# Patient Record
Sex: Female | Born: 1940
Health system: Southern US, Community
[De-identification: ages and names within clinical notes are randomized; demographics above are authoritative.]

## PROBLEM LIST (undated history)

## (undated) DIAGNOSIS — K449 Diaphragmatic hernia without obstruction or gangrene: Secondary | ICD-10-CM

## (undated) DIAGNOSIS — Z9889 Other specified postprocedural states: Secondary | ICD-10-CM

## (undated) DIAGNOSIS — F419 Anxiety disorder, unspecified: Secondary | ICD-10-CM

## (undated) DIAGNOSIS — B3781 Candidal esophagitis: Secondary | ICD-10-CM

## (undated) DIAGNOSIS — F32A Depression, unspecified: Secondary | ICD-10-CM

## (undated) DIAGNOSIS — Z8719 Personal history of other diseases of the digestive system: Secondary | ICD-10-CM

## (undated) DIAGNOSIS — W19XXXA Unspecified fall, initial encounter: Secondary | ICD-10-CM

## (undated) DIAGNOSIS — IMO0002 Reserved for concepts with insufficient information to code with codable children: Secondary | ICD-10-CM

## (undated) DIAGNOSIS — I10 Essential (primary) hypertension: Secondary | ICD-10-CM

## (undated) DIAGNOSIS — M1990 Unspecified osteoarthritis, unspecified site: Secondary | ICD-10-CM

## (undated) DIAGNOSIS — F410 Panic disorder [episodic paroxysmal anxiety] without agoraphobia: Secondary | ICD-10-CM

## (undated) DIAGNOSIS — E7800 Pure hypercholesterolemia, unspecified: Secondary | ICD-10-CM

## (undated) DIAGNOSIS — R112 Nausea with vomiting, unspecified: Secondary | ICD-10-CM

## (undated) DIAGNOSIS — K2960 Other gastritis without bleeding: Secondary | ICD-10-CM

## (undated) DIAGNOSIS — G4730 Sleep apnea, unspecified: Secondary | ICD-10-CM

## (undated) DIAGNOSIS — R296 Repeated falls: Secondary | ICD-10-CM

## (undated) DIAGNOSIS — E669 Obesity, unspecified: Secondary | ICD-10-CM

## (undated) DIAGNOSIS — K219 Gastro-esophageal reflux disease without esophagitis: Secondary | ICD-10-CM

## (undated) DIAGNOSIS — N189 Chronic kidney disease, unspecified: Secondary | ICD-10-CM

## (undated) DIAGNOSIS — F329 Major depressive disorder, single episode, unspecified: Secondary | ICD-10-CM

## (undated) HISTORY — PX: APPENDECTOMY: SHX54

## (undated) HISTORY — DX: Pure hypercholesterolemia, unspecified: E78.00

## (undated) HISTORY — DX: Unspecified fall, initial encounter: W19.XXXA

## (undated) HISTORY — DX: Essential (primary) hypertension: I10

## (undated) HISTORY — DX: Anxiety disorder, unspecified: F41.9

## (undated) HISTORY — DX: Depression, unspecified: F32.A

## (undated) HISTORY — PX: TUBAL LIGATION: SHX77

## (undated) HISTORY — DX: Obesity, unspecified: E66.9

## (undated) HISTORY — DX: Sleep apnea, unspecified: G47.30

## (undated) HISTORY — DX: Other gastritis without bleeding: K29.60

## (undated) HISTORY — DX: Major depressive disorder, single episode, unspecified: F32.9

## (undated) HISTORY — DX: Candidal esophagitis: B37.81

## (undated) HISTORY — DX: Gastro-esophageal reflux disease without esophagitis: K21.9

## (undated) HISTORY — DX: Chronic kidney disease, unspecified: N18.9

## (undated) HISTORY — DX: Reserved for concepts with insufficient information to code with codable children: IMO0002

## (undated) HISTORY — DX: Diaphragmatic hernia without obstruction or gangrene: K44.9

## (undated) HISTORY — DX: Panic disorder (episodic paroxysmal anxiety): F41.0

## (undated) HISTORY — DX: Repeated falls: R29.6

## (undated) HISTORY — DX: Unspecified osteoarthritis, unspecified site: M19.90

## (undated) HISTORY — DX: Personal history of other diseases of the digestive system: Z87.19

---

## 1985-10-19 HISTORY — PX: ABDOMINAL HYSTERECTOMY: SHX81

## 1987-10-20 HISTORY — PX: BREAST EXCISIONAL BIOPSY: SUR124

## 1987-10-20 HISTORY — PX: BREAST SURGERY: SHX581

## 1998-11-05 ENCOUNTER — Other Ambulatory Visit: Admission: RE | Admit: 1998-11-05 | Discharge: 1998-11-05 | Payer: Self-pay | Admitting: Obstetrics and Gynecology

## 1999-08-21 ENCOUNTER — Encounter: Payer: Self-pay | Admitting: Obstetrics and Gynecology

## 1999-08-21 ENCOUNTER — Encounter: Admission: RE | Admit: 1999-08-21 | Discharge: 1999-08-21 | Payer: Self-pay | Admitting: Obstetrics and Gynecology

## 1999-10-13 ENCOUNTER — Emergency Department (HOSPITAL_COMMUNITY): Admission: EM | Admit: 1999-10-13 | Discharge: 1999-10-13 | Payer: Self-pay | Admitting: *Deleted

## 1999-10-20 HISTORY — PX: CHOLECYSTECTOMY: SHX55

## 1999-11-06 ENCOUNTER — Encounter: Payer: Self-pay | Admitting: General Surgery

## 1999-11-11 ENCOUNTER — Encounter (INDEPENDENT_AMBULATORY_CARE_PROVIDER_SITE_OTHER): Payer: Self-pay | Admitting: *Deleted

## 1999-11-11 ENCOUNTER — Ambulatory Visit (HOSPITAL_COMMUNITY): Admission: RE | Admit: 1999-11-11 | Discharge: 1999-11-12 | Payer: Self-pay | Admitting: General Surgery

## 2000-08-25 ENCOUNTER — Encounter: Payer: Self-pay | Admitting: Obstetrics and Gynecology

## 2000-08-25 ENCOUNTER — Encounter: Admission: RE | Admit: 2000-08-25 | Discharge: 2000-08-25 | Payer: Self-pay | Admitting: Obstetrics and Gynecology

## 2001-09-02 ENCOUNTER — Encounter: Admission: RE | Admit: 2001-09-02 | Discharge: 2001-09-02 | Payer: Self-pay | Admitting: Obstetrics and Gynecology

## 2001-09-02 ENCOUNTER — Encounter: Payer: Self-pay | Admitting: Obstetrics and Gynecology

## 2002-04-03 ENCOUNTER — Other Ambulatory Visit: Admission: RE | Admit: 2002-04-03 | Discharge: 2002-04-03 | Payer: Self-pay | Admitting: Dermatology

## 2002-09-13 ENCOUNTER — Encounter: Payer: Self-pay | Admitting: Obstetrics and Gynecology

## 2002-09-13 ENCOUNTER — Encounter: Admission: RE | Admit: 2002-09-13 | Discharge: 2002-09-13 | Payer: Self-pay | Admitting: Obstetrics and Gynecology

## 2003-10-11 ENCOUNTER — Encounter: Admission: RE | Admit: 2003-10-11 | Discharge: 2003-10-11 | Payer: Self-pay | Admitting: Obstetrics and Gynecology

## 2004-06-20 ENCOUNTER — Inpatient Hospital Stay (HOSPITAL_BASED_OUTPATIENT_CLINIC_OR_DEPARTMENT_OTHER): Admission: RE | Admit: 2004-06-20 | Discharge: 2004-06-20 | Payer: Self-pay | Admitting: Cardiovascular Disease

## 2005-01-05 ENCOUNTER — Encounter: Admission: RE | Admit: 2005-01-05 | Discharge: 2005-01-05 | Payer: Self-pay | Admitting: Obstetrics and Gynecology

## 2005-03-04 ENCOUNTER — Ambulatory Visit: Payer: Self-pay | Admitting: Gastroenterology

## 2005-04-10 ENCOUNTER — Emergency Department (HOSPITAL_COMMUNITY): Admission: EM | Admit: 2005-04-10 | Discharge: 2005-04-11 | Payer: Self-pay | Admitting: Emergency Medicine

## 2005-04-27 ENCOUNTER — Ambulatory Visit: Payer: Self-pay | Admitting: Gastroenterology

## 2006-01-08 ENCOUNTER — Encounter: Admission: RE | Admit: 2006-01-08 | Discharge: 2006-01-08 | Payer: Self-pay | Admitting: Obstetrics and Gynecology

## 2006-04-08 ENCOUNTER — Encounter: Admission: RE | Admit: 2006-04-08 | Discharge: 2006-04-08 | Payer: Self-pay | Admitting: Internal Medicine

## 2007-04-13 ENCOUNTER — Encounter: Admission: RE | Admit: 2007-04-13 | Discharge: 2007-04-13 | Payer: Self-pay | Admitting: Obstetrics and Gynecology

## 2008-04-13 ENCOUNTER — Encounter: Admission: RE | Admit: 2008-04-13 | Discharge: 2008-04-13 | Payer: Self-pay | Admitting: Anesthesiology

## 2009-02-13 ENCOUNTER — Telehealth: Payer: Self-pay | Admitting: Gastroenterology

## 2009-05-07 ENCOUNTER — Encounter: Admission: RE | Admit: 2009-05-07 | Discharge: 2009-05-07 | Payer: Self-pay | Admitting: Obstetrics and Gynecology

## 2009-07-05 DIAGNOSIS — G4733 Obstructive sleep apnea (adult) (pediatric): Secondary | ICD-10-CM | POA: Insufficient documentation

## 2009-07-05 DIAGNOSIS — K219 Gastro-esophageal reflux disease without esophagitis: Secondary | ICD-10-CM | POA: Insufficient documentation

## 2009-08-23 DIAGNOSIS — I4510 Unspecified right bundle-branch block: Secondary | ICD-10-CM | POA: Insufficient documentation

## 2009-10-14 ENCOUNTER — Inpatient Hospital Stay (HOSPITAL_COMMUNITY): Admission: EM | Admit: 2009-10-14 | Discharge: 2009-10-20 | Payer: Self-pay | Admitting: Emergency Medicine

## 2009-10-14 ENCOUNTER — Encounter (INDEPENDENT_AMBULATORY_CARE_PROVIDER_SITE_OTHER): Payer: Self-pay

## 2010-02-03 ENCOUNTER — Encounter (INDEPENDENT_AMBULATORY_CARE_PROVIDER_SITE_OTHER): Payer: Self-pay | Admitting: *Deleted

## 2010-05-09 ENCOUNTER — Encounter: Admission: RE | Admit: 2010-05-09 | Discharge: 2010-05-09 | Payer: Self-pay | Admitting: Obstetrics and Gynecology

## 2010-09-04 DIAGNOSIS — R809 Proteinuria, unspecified: Secondary | ICD-10-CM | POA: Insufficient documentation

## 2010-11-01 ENCOUNTER — Emergency Department (HOSPITAL_COMMUNITY)
Admission: EM | Admit: 2010-11-01 | Discharge: 2010-11-01 | Payer: Self-pay | Source: Home / Self Care | Admitting: Emergency Medicine

## 2010-11-18 NOTE — Letter (Signed)
Summary: New Patient letter  [facility]  [address] [place], [place] [address]   Phone: [phone]  Fax: [phone]       02/03/2010 MRN: [id number]  Linda [name] [address] [place], [place]  [address]  Dear Ms. Cutillo,  Welcome to the Gastroenterology Division at [facility].    You are scheduled to see Dr. Russella Dar on 03/20/2010 at 10:00AM on the 3rd floor at [facility], [address].  We ask that you try to arrive at our office 15 minutes prior to your appointment time to allow for check-in.  We would like you to complete the enclosed self-administered evaluation form prior to your visit and bring it with you on the day of your appointment.  We will review it with you.  Also, please bring a complete list of all your medications or, if you prefer, bring the medication bottles and we will list them.  Please bring your insurance card so that we may make a copy of it.  If your insurance requires a referral to see a specialist, please bring your referral form from your primary care physician.  Co-payments are due at the time of your visit and may be paid by cash, check or credit card.     Your office visit will consist of a consult with your physician (includes a physical exam), any laboratory testing he/she may order, scheduling of any necessary diagnostic testing (e.g. x-ray, ultrasound, CT-scan), and scheduling of a procedure (e.g. Endoscopy, Colonoscopy) if required.  Please allow enough time on your schedule to allow for any/all of these possibilities.    If you cannot keep your appointment, please call [phone] to cancel or reschedule prior to your appointment date.  This allows [place] the opportunity to schedule an appointment for another patient in need of care.  If you do not cancel or reschedule by 5 p.m. the business day prior to your appointment date, you will be charged a $50.00 late cancellation/no-show fee.    Thank you for choosing  [facility] for your medical needs.  We appreciate the opportunity to care for you.  Please visit [place] at our website  to learn more about our practice.                     Sincerely,                                                             The Gastroenterology Division

## 2011-01-04 LAB — CBC
Hemoglobin: 11.3 g/dL — ABNORMAL LOW (ref 12.0–15.0)
MCHC: 34.8 g/dL (ref 30.0–36.0)
MCV: 94.3 fL (ref 78.0–100.0)
RBC: 3.44 MIL/uL — ABNORMAL LOW (ref 3.87–5.11)
RDW: 12.1 % (ref 11.5–15.5)

## 2011-01-04 LAB — CREATININE, SERUM
Creatinine, Ser: 0.64 mg/dL (ref 0.4–1.2)
GFR calc Af Amer: >60 mL/min (ref >60)
GFR calc non Af Amer: >60 mL/min (ref >60)

## 2011-01-04 LAB — POTASSIUM: Potassium: 4.6 mEq/L (ref 3.5–5.1)

## 2011-01-19 LAB — CBC
Hemoglobin: 10.1 g/dL — ABNORMAL LOW (ref 12.0–15.0)
MCHC: 34.6 g/dL (ref 30.0–36.0)
MCV: 94.7 fL (ref 78.0–100.0)
Platelets: 356 10*3/uL (ref 150–400)
Platelets: 506 10*3/uL — ABNORMAL HIGH (ref 150–400)
RBC: 3.06 MIL/uL — ABNORMAL LOW (ref 3.87–5.11)
RDW: 11.8 % (ref 11.5–15.5)
RDW: 12 % (ref 11.5–15.5)
WBC: 10.8 10*3/uL — ABNORMAL HIGH (ref 4.0–10.5)
WBC: 13.9 10*3/uL — ABNORMAL HIGH (ref 4.0–10.5)

## 2011-01-19 LAB — BASIC METABOLIC PANEL
GFR calc Af Amer: >60 mL/min (ref >60)
GFR calc non Af Amer: >60 mL/min (ref >60)
Glucose, Bld: 120 mg/dL — ABNORMAL HIGH (ref 70–99)
Sodium: 137 mEq/L (ref 135–145)

## 2011-03-06 NOTE — Op Note (Signed)
[facility]  Patient:    VAISHALI BAISE                    MRN: [id number] Proc. Date: 11/11/99 Adm. Date:  [id number] Attending:  Carson Myrtle                           Operative Report  PREOPERATIVE DIAGNOSIS:  Chronic cholecystolithiasis.  POSTOPERATIVE DIAGNOSIS:  Chronic cholecystolithiasis.  PROCEDURE:  Laparoscopic cholecystectomy.  SURGEON:  Timothy E. Earlene Plater, M.D.  ASSISTANT:  Anselm Pancoast. Zachery Dakins, M.D.  ANESTHESIA:  CRNA supervised M.D.  INDICATIONS:  The patient has been otherwise well with a history of a hiatal hernia reflux and in the past few months, onset of right upper quadrant pain and food intolerance, and frequent pain and nausea.  Her GI workup has been completed by  Judie Petit T. Pleas Koch., M.D. [facility].  She was seen in the [facility] Emergency Room where an ultrasound was done.  Gallstones were diagnosed.  She saw me in consultation and we have arranged for this laparoscopic cholecystectomy.  I believe she agrees and understands the procedure.  LABORATORY DATA:  Today are normal.  DESCRIPTION OF PROCEDURE:  The patient was brought back to the operating room and placed supine.  General endotracheal anesthesia administered.  The abdomen was prepped and draped in the usual fashion.  Prior to any skin incision, 0.25% Marcaine with epinephrine was used __________.  An infraumbilical incision was made.  The fascia identified and opened vertically.  The peritoneum entered without complication.  The Hussan catheter placed and tied in place.  The abdomen then insufflated, the camera introduced, and _______ was unremarkable.  The gallbladder did appear somewhat thickened.  There were adhesions of the omentum to the gallbladder.  A second 10 mm trocar was introduced in the midepigastric, and two 5 mm trocars in the right upper quadrant.  Appropriate instruments applied. The gallbladder grasped and placed on  tension.  The adhesions taken down and the gallbladder anatomy carefully analyzed.  At the base of the gallbladder, dissection of the peritoneum revealed a normal appearing cystic artery anterior to the cystic duct.  Both of these were completely dissected out ________, clearly visualized, and then triply clipped and divided.  Three branches of the posterior artery were encountered up the back of the gallbladder, in the gallbladder bed, and these were individually clipped with metal clips.  Otherwise, the gallbladder was removed rom the gallbladder bed without incident or complication, or unusual finding or anatomy.  The gallbladder bed was carefully inspected.  It was hemostatic and there was no bile evident.  Irrigation carried out.  Gallbladder removed through the infraumbilical incision and passed off the field for specimen.  That incision closed and tied.  Irrigation carried out and careful inspection.  All instruments, trocars, CO2, and air then removed.  Each trocar site had been anesthetized and  each skin incision closed with 4-0 Monocryl.  Counts were correct.  She tolerated it well.  Was extubated and taken to the recovery room in good condition. DD:  11/11/99 TD:  11/11/99 Job: [id number]

## 2011-03-06 NOTE — Cardiovascular Report (Signed)
Linda [name], STARLIPER                        ACCOUNT NO.:  [id number]   MEDICAL RECORD NO.:  [id number]                   PATIENT TYPE:  OIB   LOCATION:  6501                                 FACILITY:  [facility]   PHYSICIAN:  Vesta Mixer, M.D.              DATE OF BIRTH:  04-23-1941   DATE OF PROCEDURE:  06/20/2004  DATE OF DISCHARGE:  06/20/2004                              CARDIAC CATHETERIZATION   HISTORY:  Ms. Jaster is a 70 year old female with an extremely strong  family history of coronary artery disease.  Most of her family members have  died at an earlier age due to myocardial infarction.  She recently started  having some episodes of chest fullness and some shortness of breath.  She  had a stress Cardiolite study which revealed some anterolateral ischemia.  She was referred for heart catheterization for further evaluation.   PROCEDURE:  Left heart catheterization with coronary angiography.   The right femoral artery was easily cannulated using the modified Seldinger  technique.   HEMODYNAMICS:  The left ventricular pressure was 150/12 with an aortic  pressure of 150/61.   CORONARY ANGIOGRAPHY:  1.  Left main was fairly smooth and normal.  2.  The left anterior descending artery:  Minor luminal irregularities in      the left anterior descending artery.  The first diagonal artery was      unremarkable.  3.  The left circumflex artery was fairly normal.  It supplies a significant      distribution down to the second obtuse marginal area and to the      posterior lateral wall.  The left circumflex artery is fairly tortuous.  4.  The right coronary artery is large and dominant.  There is a slight      tapered narrowing in the proximal aspect of the RCA which includes this      ostium.  This narrowing probably represents a 20-30% stenosis.  It is      relatively smooth and there are no ulcerations. Otherwise, there is no      significant plaque in the right coronary  artery.  5.  The posterior descending artery is a relatively small vessel, but is      normal.  There is a small posterior lateral branch.  This branch is also      normal.   LEFT VENTRICULOGRAM:  Left ventriculogram was performed using a 30 RAO  projection.  It reveals normal left ventricular systolic function.  The  ejection fraction is 65%.  There is no mitral regurgitation.   COMPLICATIONS:  None.   CONCLUSIONS:  1.  Minimal coronary artery irregularities.  2.  Normal left ventricular systolic function.   We will continue with  medical therapy.  Vesta Mixer, M.D.    PJN/MEDQ  D:  06/20/2004  T:  06/21/2004  Job:  [id number]   cc:   Loraine Leriche A. Waynard Edwards, M.D.  [address]  [place]  [place] [address]  Fax: [phone]

## 2011-03-06 NOTE — H&P (Signed)
NAMESOMALY, MARTENEY                        ACCOUNT NO.:  [id number]   MEDICAL RECORD NO.:  [id number]                   PATIENT TYPE:  AMB   LOCATION:                                       FACILITY:  [facility]   PHYSICIAN:  Vesta Mixer, M.D.              DATE OF BIRTH:  [birth year]   DATE OF ADMISSION:  06/20/2004  DATE OF DISCHARGE:                                HISTORY & PHYSICAL   REASON FOR ADMISSION:  [name] is a 70 year old female who is now  admitted for chest pain after having some episodes of chest fullness and an  abnormal Cardiolite study.   HISTORY OF PRESENT ILLNESS:  [name] is a 70 year old female with a  history of anxiety and hypercholesterolemia.  For the past several weeks,  she has been having some fullness in her chest.  These episodes occur at  various times.  They are not necessarily associated with exercise, although  she does note that they occur more with eating.  She does not do any regular  exercise so she has not really challenged the idea that they are not  associated with exercise.  She states that the pain is a fullness and a  pressure-like sensation.  It lasts for several minutes and causes her to be  somewhat short of breath.   She recently had a stress Cardiolite study which revealed reversible  ischemia in the anterior lateral wall.  We could not exclude breast  attenuation.  Her left ventricular systolic function was normal with an  ejection fraction of 68%.   She is seen in the office today and states that she still has these episodes  of chest fullness.   CURRENT MEDICATIONS:  1. Wellbutrin 1 q.d.  2. Klonopin 1 q.d.  3. Lexapro 1 q.d.  4. Premarin 1 q.d.  5. Zocor 1 q.d.  6. Aspirin 1 q.d.   ALLERGIES:  She is allergic to AMOXICILLIN which causes hives.   PAST MEDICAL HISTORY:  1. Anxiety.  2. Hypercholesterolemia.   SOCIAL HISTORY:  The patient does not smoke.  She does not drink alcohol.  She works at [employer] in Clinical biochemist.   FAMILY HISTORY:  Her father died at age 53 due to lung cancer.  He had a  myocardial infarction in the past and also had a history of hypertension.  Her mother had a history of coronary artery disease and died of a myocardial  infarction at age 74.  She had three brothers who have all died of  myocardial infarction in their 31s.   REVIEW OF SYMPTOMS:  Reviewed and is essentially negative.   PHYSICAL EXAMINATION:  GENERAL:  She is a middle-aged female in no acute  distress.  She is alert and oriented x 3 and her mood and affect are normal.  VITAL SIGNS:  Weight is 149, blood pressure 140/72, heart rate of 76.  NECK:  There are 2+ carotids.  She has no bruits.  There is no JVD.  No  thyromegaly.  LUNGS:  Clear to auscultation.  HEART:  Regular rate.  S1 and S2 with no murmurs, rubs, or gallops.  ABDOMEN:  Good bowel sounds and is nontender.  EXTREMITIES:  She has no clubbing, cyanosis, or edema.  NEUROLOGICAL:  Nonfocal.   IMPRESSION:  [name] presents with some episodes of chest fullness.  She has an extremely strong family history of coronary artery disease.  In  fact, many of her family members have died at a very young age due to  coronary artery disease.  She has an abnormal stress Cardiolite study with  evidence of reversible ischemia at anterior wall.  I would favor performing  a cardiac catheterization.  We have discussed the risks, benefits, and  options of heart catheterization.  She understands and agrees to proceed.  We will schedule her for an outpatient heart catheterization.                                                Vesta Mixer, M.D.    PJN/MEDQ  D:  06/16/2004  T:  06/16/2004  Job:  [id number]   cc:   Loraine Leriche A. Waynard Edwards, M.D.  [address]  [place]  [place] [address]  Fax: [phone]

## 2011-04-03 ENCOUNTER — Other Ambulatory Visit: Payer: Self-pay | Admitting: Internal Medicine

## 2011-04-03 DIAGNOSIS — Z1231 Encounter for screening mammogram for malignant neoplasm of breast: Secondary | ICD-10-CM

## 2011-05-11 ENCOUNTER — Ambulatory Visit
Admission: RE | Admit: 2011-05-11 | Discharge: 2011-05-11 | Disposition: A | Discharge status: Home / Self Care | Payer: Medicare Other | Source: Ambulatory Visit | Attending: Internal Medicine | Admitting: Internal Medicine

## 2011-05-11 DIAGNOSIS — Z1231 Encounter for screening mammogram for malignant neoplasm of breast: Secondary | ICD-10-CM

## 2011-06-16 ENCOUNTER — Ambulatory Visit: Payer: Medicare Other | Admitting: Gynecology

## 2011-06-17 ENCOUNTER — Encounter: Payer: Self-pay | Admitting: Gynecology

## 2011-06-17 ENCOUNTER — Ambulatory Visit (INDEPENDENT_AMBULATORY_CARE_PROVIDER_SITE_OTHER): Payer: Medicare Other | Admitting: Gynecology

## 2011-06-17 VITALS — BP 136/60 | Ht 61.5 in | Wt 141.0 lb

## 2011-06-17 DIAGNOSIS — N898 Other specified noninflammatory disorders of vagina: Secondary | ICD-10-CM

## 2011-06-17 DIAGNOSIS — B373 Candidiasis of vulva and vagina: Secondary | ICD-10-CM

## 2011-06-17 DIAGNOSIS — E7800 Pure hypercholesterolemia, unspecified: Secondary | ICD-10-CM | POA: Insufficient documentation

## 2011-06-17 DIAGNOSIS — N8111 Cystocele, midline: Secondary | ICD-10-CM

## 2011-06-17 DIAGNOSIS — N952 Postmenopausal atrophic vaginitis: Secondary | ICD-10-CM

## 2011-06-17 DIAGNOSIS — I10 Essential (primary) hypertension: Secondary | ICD-10-CM | POA: Insufficient documentation

## 2011-06-17 DIAGNOSIS — F419 Anxiety disorder, unspecified: Secondary | ICD-10-CM | POA: Insufficient documentation

## 2011-06-17 DIAGNOSIS — Z79890 Hormone replacement therapy (postmenopausal): Secondary | ICD-10-CM

## 2011-06-17 DIAGNOSIS — B3731 Acute candidiasis of vulva and vagina: Secondary | ICD-10-CM

## 2011-06-17 MED ORDER — FLUCONAZOLE 150 MG PO TABS: 150.0000 mg | ORAL_TABLET | Freq: Once | ORAL | Status: AC

## 2011-06-17 MED ORDER — ESTROGENS CONJUGATED 0.45 MG PO TABS: 0.4500 mg | ORAL_TABLET | Freq: Every day | ORAL | Status: DC

## 2011-06-17 NOTE — Progress Notes (Signed)
Linda [name] 12-19-1940 [id number]        70 y.o.  for followup in discussion about ERT. She is being followed by Dr. Sherrye Payor for hypertension, hypercholesterolemia, GERD, anxiety and depression. She is status post TAH LSO in 1987 by Dr. Leota Sauers for menorrhagia, leiomyoma and endometriosis.  She currently is on Premarin 0.45 mg daily.  Past medical history,surgical history, allergies, family history and social history were all reviewed and documented in the EPIC chart. ROS:  Was performed and pertinent positives and negatives are included in the history.  Exam: chaperone present Filed Vitals:   06/17/11 1002  BP: 136/60   General appearance  Normal Skin grossly normal Head/Neck normal with no cervical or supraclavicular adenopathy thyroid normal Lungs  clear Cardiac RR, without RMG Abdominal  soft, nontender, without masses, organomegaly or hernia Breasts  examined lying and sitting without masses, retractions, discharge or axillary adenopathy. Pelvic  Ext/BUS/vagina  normal atrophic changes noted. First degree cystocele with straining.  White discharge KOH wet prep done  Adnexa  Without masses or tenderness    Anus and perineum  normal   Rectovaginal  normal sphincter tone without palpated masses or tenderness.    Assessment/Plan:  70 y.o. female  #1 ERT. Patient currently is on Premarin 0.45 mg daily. She has been on Premarin since her hysterectomy. I reviewed with the patient the issues of ERT to include her age, WHI study, ACOG and NAMS statements to include lowest dose for shortness period of time. Increased risk of stroke, heart attack, DVT as well as possible increased risk of breast cancer was all discussed with her. The issues of oral versus transdermal first pass effect, decreased risk of thrombosis with transdermal was also reviewed.  My recommendation is for her to wean herself from the Premarin over a several week period and see how she does. If she has significant  symptoms such as hot flushes, night sweats that she can reinitiate the Premarin accepting the above risks. Patient is comfortable with this. She does not want to try a transdermal and she is comfortable being on the oral. #2 White discharge. KOH wet prep is positive for yeast we'll treat with Diflucan 150x1 dose. #3 Mild cystocele. Patient is asymptomatic. She is having no issues with incontinence. Will monitor at present. #4 Health maintenance. No Pap was done today. I reviewed current guidelines with her. She has no history of abnormal Pap smears before, she is over 65  and she is status post hysterectomy. She's comfortable with not doing Pap smears.  She sees Dr. Sherrye Payor routinely is up-to-date with colon screening with last colonoscopy in 2007. She says that she checks her stool annually for blood. She just had her mammogram in July she'll continue with annual mammography and self breast exams on a monthly basis. She also reports having a DEXA study 2 years ago and will followup with Dr. Sherrye Payor in reference to this. No blood work was done today as is all done through his office. Assuming she does well she'll see me in a year. If she has any issues or questions she'll follow up sooner.   Dara Lords MD, 10:51 AM 06/17/2011

## 2011-11-23 DIAGNOSIS — I10 Essential (primary) hypertension: Secondary | ICD-10-CM | POA: Diagnosis not present

## 2011-11-23 DIAGNOSIS — E785 Hyperlipidemia, unspecified: Secondary | ICD-10-CM | POA: Diagnosis not present

## 2011-11-23 DIAGNOSIS — F3289 Other specified depressive episodes: Secondary | ICD-10-CM | POA: Diagnosis not present

## 2011-11-23 DIAGNOSIS — F329 Major depressive disorder, single episode, unspecified: Secondary | ICD-10-CM | POA: Diagnosis not present

## 2011-11-23 DIAGNOSIS — R809 Proteinuria, unspecified: Secondary | ICD-10-CM | POA: Diagnosis not present

## 2011-11-30 ENCOUNTER — Telehealth: Payer: Self-pay | Admitting: *Deleted

## 2011-11-30 NOTE — Telephone Encounter (Signed)
Called patient to tell her that we processed a prior authorization for Premarin 0.45 and was approved through insurance.

## 2011-12-01 DIAGNOSIS — Z0000 Encounter for general adult medical examination without abnormal findings: Secondary | ICD-10-CM | POA: Diagnosis not present

## 2011-12-01 DIAGNOSIS — N182 Chronic kidney disease, stage 2 (mild): Secondary | ICD-10-CM | POA: Diagnosis not present

## 2011-12-01 DIAGNOSIS — E785 Hyperlipidemia, unspecified: Secondary | ICD-10-CM | POA: Diagnosis not present

## 2011-12-01 DIAGNOSIS — I10 Essential (primary) hypertension: Secondary | ICD-10-CM | POA: Diagnosis not present

## 2011-12-02 DIAGNOSIS — Z1212 Encounter for screening for malignant neoplasm of rectum: Secondary | ICD-10-CM | POA: Diagnosis not present

## 2012-04-04 DIAGNOSIS — H2510 Age-related nuclear cataract, unspecified eye: Secondary | ICD-10-CM | POA: Diagnosis not present

## 2012-04-04 DIAGNOSIS — H524 Presbyopia: Secondary | ICD-10-CM | POA: Diagnosis not present

## 2012-04-04 DIAGNOSIS — H52229 Regular astigmatism, unspecified eye: Secondary | ICD-10-CM | POA: Diagnosis not present

## 2012-04-04 DIAGNOSIS — H2589 Other age-related cataract: Secondary | ICD-10-CM | POA: Diagnosis not present

## 2012-04-13 ENCOUNTER — Other Ambulatory Visit: Payer: Self-pay | Admitting: Internal Medicine

## 2012-04-13 DIAGNOSIS — Z1231 Encounter for screening mammogram for malignant neoplasm of breast: Secondary | ICD-10-CM

## 2012-05-16 ENCOUNTER — Ambulatory Visit
Admission: RE | Admit: 2012-05-16 | Discharge: 2012-05-16 | Disposition: A | Discharge status: Home / Self Care | Payer: Medicare Other | Source: Ambulatory Visit | Attending: Internal Medicine | Admitting: Internal Medicine

## 2012-05-16 DIAGNOSIS — Z1231 Encounter for screening mammogram for malignant neoplasm of breast: Secondary | ICD-10-CM | POA: Diagnosis not present

## 2012-05-17 ENCOUNTER — Encounter: Payer: Self-pay | Admitting: Gastroenterology

## 2012-06-09 DIAGNOSIS — R0602 Shortness of breath: Secondary | ICD-10-CM | POA: Diagnosis not present

## 2012-06-09 DIAGNOSIS — I10 Essential (primary) hypertension: Secondary | ICD-10-CM | POA: Diagnosis not present

## 2012-06-09 DIAGNOSIS — F438 Other reactions to severe stress: Secondary | ICD-10-CM | POA: Diagnosis not present

## 2012-06-09 DIAGNOSIS — F4389 Other reactions to severe stress: Secondary | ICD-10-CM | POA: Diagnosis not present

## 2012-06-09 DIAGNOSIS — R9431 Abnormal electrocardiogram [ECG] [EKG]: Secondary | ICD-10-CM | POA: Diagnosis not present

## 2012-06-22 ENCOUNTER — Ambulatory Visit (INDEPENDENT_AMBULATORY_CARE_PROVIDER_SITE_OTHER): Payer: Medicare Other | Admitting: Gastroenterology

## 2012-06-22 ENCOUNTER — Encounter: Payer: Self-pay | Admitting: Gastroenterology

## 2012-06-22 VITALS — BP 132/68 | HR 60 | Ht 61.5 in | Wt 142.0 lb

## 2012-06-22 DIAGNOSIS — K219 Gastro-esophageal reflux disease without esophagitis: Secondary | ICD-10-CM | POA: Diagnosis not present

## 2012-06-22 DIAGNOSIS — Z1211 Encounter for screening for malignant neoplasm of colon: Secondary | ICD-10-CM | POA: Diagnosis not present

## 2012-06-22 NOTE — Progress Notes (Signed)
History of Present Illness: This is a 71 year old female who I have treated in the past. She has a long history of GERD that has been moderately difficult to control. She previously underwent upper endoscopy and colonoscopy in July 2006. She noted a flare of her reflux symptoms several weeks ago but they're now under better control. She also noted mild constipation but this has resolved with increasing her fiber and water intake. She occasionally notes some difficulty swallowing solids and liquids. She had similar symptoms at the time of her last endoscopy and underwent dilation for dysphagia without a stricture. I reviewed recent office records from Dr. Ronnald Collum. Denies weight loss, abdominal pain, diarrhea, change in stool caliber, melena, hematochezia, nausea, vomiting, chest pain.  Allergies  Allergen Reactions  . Amoxicillin   . Aripiprazole    Outpatient Prescriptions Prior to Visit  Medication Sig Dispense Refill  . ALPRAZolam (XANAX) 1 MG tablet Take 1 mg by mouth 3 (three) times daily as needed.        Marland Kitchen aspirin 81 MG tablet Take 81 mg by mouth daily.        . benazepril (LOTENSIN) 40 MG tablet Take 40 mg by mouth daily.        . calcium carbonate (OS-CAL) 600 MG TABS Take 600 mg by mouth 2 (two) times daily with a meal.        . dexlansoprazole (DEXILANT) 60 MG capsule Take 60 mg by mouth daily.        Marland Kitchen estrogens, conjugated, (PREMARIN) 0.45 MG tablet Take 1 tablet (0.45 mg total) by mouth daily.  30 tablet  11  . fish oil-omega-3 fatty acids 1000 MG capsule Take 2 g by mouth daily.        . furosemide (LASIX) 20 MG tablet Take 20 mg by mouth 2 (two) times daily.        Marland Kitchen lamoTRIgine (LAMICTAL) 200 MG tablet Take 200 mg by mouth daily.        . nortriptyline (PAMELOR) 50 MG capsule Take 50 mg by mouth at bedtime.        . potassium chloride SA (K-DUR,KLOR-CON) 20 MEQ tablet Take 20 mEq by mouth 2 (two) times daily.        . risperiDONE (RISPERDAL) 1 MG tablet Takes 1/2 tablet by mouth  every other day      . simvastatin (ZOCOR) 40 MG tablet Take 40 mg by mouth at bedtime.         Past Medical History  Diagnosis Date  . Hypertension   . High cholesterol   . Anxiety   . Depression   . Panic disorder   . GERD (gastroesophageal reflux disease)   . Erosive gastritis   . Hiatal hernia   . Obesity   . DDD (degenerative disc disease)   . Osteoarthritis   . Chronic kidney disease   . Sleep apnea     Does not need CPAP anymore  . History of anal fissures    Past Surgical History  Procedure Date  . Cholecystectomy 2001  . Appendectomy   . Tubal ligation   . Abdominal hysterectomy 1987    LSO / menorrhagia & leiomyomata  . Breast surgery 1989   History   Social History  . Marital Status: Married    Spouse Name: N/A    Number of Children: 1  . Years of Education: N/A   Occupational History  . Retired     Social History Main Topics  . Smoking  status: Never Smoker   . Smokeless tobacco: Never Used  . Alcohol Use: No  . Drug Use: No  . Sexually Active: Yes   Other Topics Concern  . None   Social History Narrative   Daily caffeine    Family History  Problem Relation Age of Onset  . Heart disease Mother   . Lung cancer Father   . Diabetes Sister   . Hypertension Sister   . Diabetes Brother   . Heart disease Brother   . Ovarian cancer Sister   . Heart disease Brother   . Heart disease Brother   . Colon cancer Neg Hx    Review of Systems: Pertinent positive and negative review of systems were noted in the above HPI section. All other review of systems were otherwise negative.  Physical Exam: General: Well developed , well nourished, no acute distress Head: Normocephalic and atraumatic Eyes:  sclerae anicteric, EOMI Ears: Normal auditory acuity Mouth: No deformity or lesions Neck: Supple, no masses or thyromegaly Lungs: Clear throughout to auscultation Heart: Regular rate and rhythm; no murmurs, rubs or bruits Abdomen: Soft, non tender and  non distended. No masses, hepatosplenomegaly or hernias noted. Normal Bowel sounds Musculoskeletal: Symmetrical with no gross deformities  Skin: No lesions on visible extremities Pulses:  Normal pulses noted Extremities: No clubbing, cyanosis, edema or deformities noted Neurological: Alert oriented x 4, grossly nonfocal Cervical Nodes:  No significant cervical adenopathy Inguinal Nodes: No significant inguinal adenopathy Psychological:  Alert and cooperative. Normal mood and affect  Assessment and Recommendations:  1. Chronic GERD with intermittent solid and liquid dysphagia. Recent flare of symptoms has not resolved and her symptoms are under good control. She feels Nexium has worked best for her in the past however this is no longer covered by her insurance. She feels Dexilant is effective and takes occasional ranitidine for breakthrough symptoms. Rule out a stricture, esophagitis. The risks, benefits, and alternatives to endoscopy with possible biopsy and possible dilation were discussed with the patient and they consent to proceed.   2. Colorectal cancer screening, average risk. He states she has annual Hemoccults through Dr. Pierre Bali office and they have been negative. 10 year screening colonoscopy due July 2016.  3. Mild constipation. Continue high fiber diet with increased water intake.

## 2012-06-22 NOTE — Patient Instructions (Addendum)
You have been scheduled for an endoscopy with propofol. Please follow written instructions given to you at your visit today. If you use inhalers (even only as needed), please bring them with you on the day of your procedure. You have been given anti-reflux measures to follow.  CC: Rodrigo Ran, M.D.

## 2012-06-24 DIAGNOSIS — I10 Essential (primary) hypertension: Secondary | ICD-10-CM | POA: Diagnosis not present

## 2012-06-24 DIAGNOSIS — I498 Other specified cardiac arrhythmias: Secondary | ICD-10-CM | POA: Diagnosis not present

## 2012-06-24 DIAGNOSIS — F438 Other reactions to severe stress: Secondary | ICD-10-CM | POA: Diagnosis not present

## 2012-06-24 DIAGNOSIS — F4389 Other reactions to severe stress: Secondary | ICD-10-CM | POA: Diagnosis not present

## 2012-07-05 ENCOUNTER — Ambulatory Visit (AMBULATORY_SURGERY_CENTER): Payer: Medicare Other | Admitting: Gastroenterology

## 2012-07-05 ENCOUNTER — Encounter: Payer: Self-pay | Admitting: Gastroenterology

## 2012-07-05 VITALS — BP 123/45 | HR 60 | Temp 98.4°F | Resp 21 | Ht 62.0 in | Wt 141.0 lb

## 2012-07-05 DIAGNOSIS — K319 Disease of stomach and duodenum, unspecified: Secondary | ICD-10-CM | POA: Diagnosis not present

## 2012-07-05 DIAGNOSIS — K2990 Gastroduodenitis, unspecified, without bleeding: Secondary | ICD-10-CM | POA: Diagnosis not present

## 2012-07-05 DIAGNOSIS — K259 Gastric ulcer, unspecified as acute or chronic, without hemorrhage or perforation: Secondary | ICD-10-CM | POA: Diagnosis not present

## 2012-07-05 DIAGNOSIS — K219 Gastro-esophageal reflux disease without esophagitis: Secondary | ICD-10-CM

## 2012-07-05 DIAGNOSIS — B379 Candidiasis, unspecified: Secondary | ICD-10-CM

## 2012-07-05 DIAGNOSIS — B3781 Candidal esophagitis: Secondary | ICD-10-CM | POA: Diagnosis not present

## 2012-07-05 DIAGNOSIS — K2970 Gastritis, unspecified, without bleeding: Secondary | ICD-10-CM

## 2012-07-05 MED ORDER — FLUCONAZOLE 100 MG PO TABS: 100.0000 mg | ORAL_TABLET | Freq: Every day | ORAL | Status: DC

## 2012-07-05 MED ORDER — SODIUM CHLORIDE 0.9 % IV SOLN: 500.0000 mL | INTRAVENOUS | Status: DC

## 2012-07-05 NOTE — Op Note (Addendum)
[facility] [address] [place], [address]   ENDOSCOPY PROCEDURE REPORT  PATIENT: [name], Linda  MR#: [id number] BIRTHDATE: [birth year] , 71  yrs. old GENDER: Female ENDOSCOPIST: Meryl Dare, MD, [facility]  PROCEDURE DATE:  07/05/2012 PROCEDURE:  EGD w/ biopsy ASA CLASS:     Class II INDICATIONS:  history of esophageal reflux,  dysphagia. MEDICATIONS: MAC sedation, administered by CRNA and propofol (Diprivan) 100mg  IV TOPICAL ANESTHETIC: Cetacaine Spray DESCRIPTION OF PROCEDURE: After the risks benefits and alternatives of the procedure were thoroughly explained, informed consent was obtained.  The [facility] [id number] endoscope was introduced through the mouth and advanced to the second portion of the duodenum. Without limitations.  The instrument was slowly withdrawn as the mucosa was fully examined.   ESOPHAGUS: White exudates consistent with candidiasis were found in the lower third of the esophagus. Multiple biopsies were performed. The esophagus was otherwise normal. STOMACH: Erosive gastritis (inflammation) was found in the prepyloric region of the stomach.  Multiple biopsies were performed using cold forceps.  The stomach otherwise appeared normal. DUODENUM: The duodenal mucosa showed no abnormalities in the 2nd part of the duodenum. Retroflexed views revealed no abnormalities. The scope was then withdrawn from the patient and the procedure completed.  COMPLICATIONS: There were no complications.  ENDOSCOPIC IMPRESSION: 1.   White exudates consistent with candidiasis; multiple biopsies 2.   Erosive gastritis (inflammation); multiple biopsies  RECOMMENDATIONS: 1.  anti-reflux regimen 2.  await pathology results 3.  continue PPI and begin Diflucan 100 mg po qd, #7    eSigned:  Meryl Dare, MD, [facility] 07/05/2012 12:21 PM Revised: 07/05/2012 12:21 PM  XB:JYNW Perini, MD

## 2012-07-05 NOTE — Patient Instructions (Addendum)
YOU HAD AN ENDOSCOPIC PROCEDURE TODAY AT THE [facility]: Refer to the procedure report that was given to you for any specific questions about what was found during the examination.  If the procedure report does not answer your questions, please call your gastroenterologist to clarify.  If you requested that your care partner not be given the details of your procedure findings, then the procedure report has been included in a sealed envelope for you to review at your convenience later.  YOU SHOULD EXPECT: Some feelings of bloating in the abdomen. Passage of more gas than usual.  Walking can help get rid of the air that was put into your GI tract during the procedure and reduce the bloating. If you had a lower endoscopy (such as a colonoscopy or flexible sigmoidoscopy) you may notice spotting of blood in your stool or on the toilet paper. If you underwent a bowel prep for your procedure, then you may not have a normal bowel movement for a few days.  DIET: Your first meal following the procedure should be a light meal and then it is ok to progress to your normal diet.  A half-sandwich or bowl of soup is an example of a good first meal.  Heavy or fried foods are harder to digest and may make you feel nauseous or bloated.  Likewise meals heavy in dairy and vegetables can cause extra gas to form and this can also increase the bloating.  Drink plenty of fluids but you should avoid alcoholic beverages for 24 hours.  ACTIVITY: Your care partner should take you home directly after the procedure.  You should plan to take it easy, moving slowly for the rest of the day.  You can resume normal activity the day after the procedure however you should NOT DRIVE or use heavy machinery for 24 hours (because of the sedation medicines used during the test).    SYMPTOMS TO REPORT IMMEDIATELY: A gastroenterologist can be reached at any hour.  During normal business hours, 8:30 AM to 5:00 PM Monday through Friday,  call [phone].  After hours and on weekends, please call the GI answering service at [phone] who will take a message and have the physician on call contact you.   Following upper endoscopy (EGD)  Vomiting of blood or coffee ground material  New chest pain or pain under the shoulder blades  Painful or persistently difficult swallowing  New shortness of breath  Fever of 100F or higher  Black, tarry-looking stools  FOLLOW UP: If any biopsies were taken you will be contacted by phone or by letter within the next 1-3 weeks.  Call your gastroenterologist if you have not heard about the biopsies in 3 weeks.  Our staff will call the home number listed on your records the next business day following your procedure to check on you and address any questions or concerns that you may have at that time regarding the information given to you following your procedure. This is a courtesy call and so if there is no answer at the home number and we have not heard from you through the emergency physician on call, we will assume that you have returned to your regular daily activities without incident.   SIGNATURES/CONFIDENTIALITY: You and/or your care partner have signed paperwork which will be entered into your electronic medical record.  These signatures attest to the fact that that the information above on your After Visit Summary has been reviewed and is understood.  Full  responsibility of the confidentiality of this discharge information lies with you and/or your care-partner.    Handout on gastritis  Start diflucan daily for 7 days  Continue your reflux medicines as directed

## 2012-07-05 NOTE — Progress Notes (Signed)
The pt tolerated the egd very well. Maw   

## 2012-07-05 NOTE — Progress Notes (Signed)
Patient did not experience any of the following events: a burn prior to discharge; a fall within the facility; wrong site/side/patient/procedure/implant event; or a hospital transfer or hospital admission upon discharge from the facility. (G8907) Patient did not have preoperative order for IV antibiotic SSI prophylaxis. (G8918)  

## 2012-07-06 ENCOUNTER — Telehealth: Payer: Self-pay

## 2012-07-06 NOTE — Telephone Encounter (Signed)
  Follow up Call-  Call back number 07/05/2012  Post procedure Call Back phone  # [phone]  Permission to leave phone message Yes     Patient questions:  Do you have a fever, pain , or abdominal swelling? no Pain Score  0 *  Have you tolerated food without any problems? yes  Have you been able to return to your normal activities? yes  Do you have any questions about your discharge instructions: Diet   no Medications  no Follow up visit  no  Do you have questions or concerns about your Care? no  Actions: * If pain score is 4 or above: No action needed, pain <4.  No problems from the pt. Maw

## 2012-07-11 ENCOUNTER — Encounter: Payer: Self-pay | Admitting: Gastroenterology

## 2012-07-28 ENCOUNTER — Telehealth: Payer: Self-pay | Admitting: Gastroenterology

## 2012-07-28 NOTE — Telephone Encounter (Signed)
Patient reports that she is having nausea.  She reports that this is an ongoing problem for many months and her primary care in the past has prescribed generic zofran.  She does not have this listed on her medication list from her last office note and did not discuss with Dr. Russella Dar at her office visit. I have asked that she schedule an office visit to be seen to discuss further.  She declines the appt and states she will see if Dr. Waynard Edwards will fill the medication for her.  She will call back if she changes her mind

## 2012-08-06 DIAGNOSIS — Z23 Encounter for immunization: Secondary | ICD-10-CM | POA: Diagnosis not present

## 2012-12-15 ENCOUNTER — Telehealth: Payer: Self-pay | Admitting: Gastroenterology

## 2012-12-15 NOTE — Telephone Encounter (Signed)
Patient is given an appt for 12/26/12.  This appt was scheduled with her husband, he will have her call back and reschedule if this is not a convenient time

## 2012-12-26 ENCOUNTER — Encounter: Payer: Self-pay | Admitting: Gastroenterology

## 2012-12-26 ENCOUNTER — Ambulatory Visit (INDEPENDENT_AMBULATORY_CARE_PROVIDER_SITE_OTHER): Payer: Medicare Other | Admitting: Gastroenterology

## 2012-12-26 VITALS — BP 122/52 | HR 48 | Ht 61.5 in | Wt 150.6 lb

## 2012-12-26 DIAGNOSIS — K219 Gastro-esophageal reflux disease without esophagitis: Secondary | ICD-10-CM | POA: Diagnosis not present

## 2012-12-26 DIAGNOSIS — Z1211 Encounter for screening for malignant neoplasm of colon: Secondary | ICD-10-CM

## 2012-12-26 DIAGNOSIS — R110 Nausea: Secondary | ICD-10-CM

## 2012-12-26 MED ORDER — RANITIDINE HCL 150 MG PO TABS: 150.0000 mg | ORAL_TABLET | Freq: Every day | ORAL | Status: DC

## 2012-12-26 MED ORDER — DEXLANSOPRAZOLE 60 MG PO CPDR: 60.0000 mg | DELAYED_RELEASE_CAPSULE | Freq: Every day | ORAL | Status: DC

## 2012-12-26 NOTE — Progress Notes (Signed)
History of Present Illness: This is a 72 year old female with GERD and nausea. She underwent upper endoscopy in September 2013 that was unremarkable. She states she has occasional sensation of something stuck in her throat. These symptoms are intermittent. She notes occasional nausea is sometimes related to meals but occurrs at various other times. Denies weight loss, abdominal pain, constipation, diarrhea, change in stool caliber, melena, hematochezia, vomiting, dysphagia, chest pain.  Allergies  Allergen Reactions  . Amoxicillin   . Aripiprazole    Outpatient Prescriptions Prior to Visit  Medication Sig Dispense Refill  . ALPRAZolam (XANAX) 1 MG tablet Take 1 mg by mouth 3 (three) times daily as needed.        Marland Kitchen amLODipine (NORVASC) 5 MG tablet       . aspirin 81 MG tablet Take 81 mg by mouth daily.        . benazepril (LOTENSIN) 40 MG tablet Take 40 mg by mouth daily.        . calcium carbonate (OS-CAL) 600 MG TABS Take 600 mg by mouth 2 (two) times daily with a meal.        . dexlansoprazole (DEXILANT) 60 MG capsule Take 60 mg by mouth daily.        Marland Kitchen estrogens, conjugated, (PREMARIN) 0.45 MG tablet Take 1 tablet (0.45 mg total) by mouth daily.  30 tablet  11  . fish oil-omega-3 fatty acids 1000 MG capsule Take 2 g by mouth daily.        . furosemide (LASIX) 20 MG tablet Take 20 mg by mouth daily.       Marland Kitchen lamoTRIgine (LAMICTAL) 200 MG tablet Take 200 mg by mouth daily.        . potassium chloride SA (K-DUR,KLOR-CON) 20 MEQ tablet Take 20 mEq by mouth daily.       . Ranitidine HCl (ZANTAC PO) Take 1 capsule by mouth as needed.      . risperiDONE (RISPERDAL) 1 MG tablet Takes 1/2 tablet by mouth every other day      . simvastatin (ZOCOR) 40 MG tablet Take 40 mg by mouth at bedtime.        . fluconazole (DIFLUCAN) 100 MG tablet Take 1 tablet (100 mg total) by mouth daily.  7 tablet  0  . PRISTIQ 100 MG 24 hr tablet        No facility-administered medications prior to visit.   Past  Medical History  Diagnosis Date  . Hypertension   . High cholesterol   . Anxiety   . Depression   . Panic disorder   . GERD (gastroesophageal reflux disease)   . Erosive gastritis   . Hiatal hernia   . Obesity   . DDD (degenerative disc disease)   . Osteoarthritis   . Chronic kidney disease   . Sleep apnea     Does not need CPAP anymore  . History of anal fissures   . Candida esophagitis    Past Surgical History  Procedure Laterality Date  . Cholecystectomy  2001  . Appendectomy    . Tubal ligation    . Abdominal hysterectomy  1987    LSO / menorrhagia & leiomyomata  . Breast surgery  1989   History   Social History  . Marital Status: Married    Spouse Name: N/A    Number of Children: 1  . Years of Education: N/A   Occupational History  . Retired     Social History Main Topics  . Smoking  status: Never Smoker   . Smokeless tobacco: Never Used  . Alcohol Use: No  . Drug Use: No  . Sexually Active: Yes   Other Topics Concern  . None   Social History Narrative   Daily caffeine    Family History  Problem Relation Age of Onset  . Heart disease Mother   . Lung cancer Father   . Diabetes Sister   . Hypertension Sister   . Diabetes Brother   . Heart disease Brother   . Ovarian cancer Sister   . Heart disease Brother   . Heart disease Brother   . Colon cancer Neg Hx       Physical Exam: General: Well developed , well nourished, no acute distress Head: Normocephalic and atraumatic Eyes:  sclerae anicteric, EOMI Ears: Normal auditory acuity Mouth: No deformity or lesions Lungs: Clear throughout to auscultation Heart: Regular rate and rhythm; no murmurs, rubs or bruits Abdomen: Soft, non tender and non distended. No masses, hepatosplenomegaly or hernias noted. Normal Bowel sounds Musculoskeletal: Symmetrical with no gross deformities  Pulses:  Normal pulses noted Extremities: No clubbing, cyanosis, edema or deformities noted Neurological: Alert  oriented x 4, grossly nonfocal Psychological:  Alert and cooperative. Normal mood and affect  Assessment and Recommendations:  1. GERD and nausea. Continue dexlansoprazole 60 mg every morning and take ranitidine 150 mg every evening, not as needed. A component of her nausea may be related to medication side effects for other causes.  2. CRC cancer screeing, average risk. Ten-year interval colonoscopy is due in July 2016.

## 2012-12-26 NOTE — Patient Instructions (Addendum)
We have sent the following medications to your pharmacy for you to pick up at your convenience: Ranitidine and Dexilant.   Thank you for choosing me and [facility].  Venita Lick. Pleas Koch., MD., Clementeen Graham  cc: Rodrigo Ran, MD

## 2013-02-14 DIAGNOSIS — E785 Hyperlipidemia, unspecified: Secondary | ICD-10-CM | POA: Diagnosis not present

## 2013-02-14 DIAGNOSIS — I10 Essential (primary) hypertension: Secondary | ICD-10-CM | POA: Diagnosis not present

## 2013-02-14 DIAGNOSIS — R809 Proteinuria, unspecified: Secondary | ICD-10-CM | POA: Diagnosis not present

## 2013-02-14 DIAGNOSIS — R82998 Other abnormal findings in urine: Secondary | ICD-10-CM | POA: Diagnosis not present

## 2013-02-21 DIAGNOSIS — N182 Chronic kidney disease, stage 2 (mild): Secondary | ICD-10-CM | POA: Diagnosis not present

## 2013-02-21 DIAGNOSIS — I4510 Unspecified right bundle-branch block: Secondary | ICD-10-CM | POA: Diagnosis not present

## 2013-02-21 DIAGNOSIS — I498 Other specified cardiac arrhythmias: Secondary | ICD-10-CM | POA: Diagnosis not present

## 2013-02-21 DIAGNOSIS — Z23 Encounter for immunization: Secondary | ICD-10-CM | POA: Diagnosis not present

## 2013-02-21 DIAGNOSIS — Z6829 Body mass index (BMI) 29.0-29.9, adult: Secondary | ICD-10-CM | POA: Diagnosis not present

## 2013-02-21 DIAGNOSIS — K219 Gastro-esophageal reflux disease without esophagitis: Secondary | ICD-10-CM | POA: Diagnosis not present

## 2013-02-21 DIAGNOSIS — E785 Hyperlipidemia, unspecified: Secondary | ICD-10-CM | POA: Diagnosis not present

## 2013-02-21 DIAGNOSIS — Z124 Encounter for screening for malignant neoplasm of cervix: Secondary | ICD-10-CM | POA: Diagnosis not present

## 2013-02-21 DIAGNOSIS — Z1331 Encounter for screening for depression: Secondary | ICD-10-CM | POA: Diagnosis not present

## 2013-02-21 DIAGNOSIS — Z0000 Encounter for general adult medical examination without abnormal findings: Secondary | ICD-10-CM | POA: Diagnosis not present

## 2013-04-06 DIAGNOSIS — H52229 Regular astigmatism, unspecified eye: Secondary | ICD-10-CM | POA: Diagnosis not present

## 2013-04-06 DIAGNOSIS — H524 Presbyopia: Secondary | ICD-10-CM | POA: Diagnosis not present

## 2013-04-06 DIAGNOSIS — H2510 Age-related nuclear cataract, unspecified eye: Secondary | ICD-10-CM | POA: Diagnosis not present

## 2013-04-06 DIAGNOSIS — H5231 Anisometropia: Secondary | ICD-10-CM | POA: Diagnosis not present

## 2013-04-17 ENCOUNTER — Other Ambulatory Visit: Payer: Self-pay

## 2013-04-17 DIAGNOSIS — Z1231 Encounter for screening mammogram for malignant neoplasm of breast: Secondary | ICD-10-CM

## 2013-04-25 DIAGNOSIS — M76899 Other specified enthesopathies of unspecified lower limb, excluding foot: Secondary | ICD-10-CM | POA: Diagnosis not present

## 2013-04-26 DIAGNOSIS — R262 Difficulty in walking, not elsewhere classified: Secondary | ICD-10-CM | POA: Diagnosis not present

## 2013-04-26 DIAGNOSIS — M25559 Pain in unspecified hip: Secondary | ICD-10-CM | POA: Diagnosis not present

## 2013-04-26 DIAGNOSIS — M545 Low back pain, unspecified: Secondary | ICD-10-CM | POA: Diagnosis not present

## 2013-05-02 DIAGNOSIS — M25559 Pain in unspecified hip: Secondary | ICD-10-CM | POA: Diagnosis not present

## 2013-05-02 DIAGNOSIS — M545 Low back pain, unspecified: Secondary | ICD-10-CM | POA: Diagnosis not present

## 2013-05-02 DIAGNOSIS — R262 Difficulty in walking, not elsewhere classified: Secondary | ICD-10-CM | POA: Diagnosis not present

## 2013-05-04 DIAGNOSIS — M545 Low back pain, unspecified: Secondary | ICD-10-CM | POA: Diagnosis not present

## 2013-05-04 DIAGNOSIS — M25559 Pain in unspecified hip: Secondary | ICD-10-CM | POA: Diagnosis not present

## 2013-05-04 DIAGNOSIS — R262 Difficulty in walking, not elsewhere classified: Secondary | ICD-10-CM | POA: Diagnosis not present

## 2013-05-09 DIAGNOSIS — R262 Difficulty in walking, not elsewhere classified: Secondary | ICD-10-CM | POA: Diagnosis not present

## 2013-05-09 DIAGNOSIS — M545 Low back pain, unspecified: Secondary | ICD-10-CM | POA: Diagnosis not present

## 2013-05-09 DIAGNOSIS — M25559 Pain in unspecified hip: Secondary | ICD-10-CM | POA: Diagnosis not present

## 2013-05-11 ENCOUNTER — Encounter: Payer: Self-pay | Admitting: Gastroenterology

## 2013-05-11 DIAGNOSIS — M25559 Pain in unspecified hip: Secondary | ICD-10-CM | POA: Diagnosis not present

## 2013-05-11 DIAGNOSIS — M545 Low back pain, unspecified: Secondary | ICD-10-CM | POA: Diagnosis not present

## 2013-05-11 DIAGNOSIS — R262 Difficulty in walking, not elsewhere classified: Secondary | ICD-10-CM | POA: Diagnosis not present

## 2013-05-16 DIAGNOSIS — R262 Difficulty in walking, not elsewhere classified: Secondary | ICD-10-CM | POA: Diagnosis not present

## 2013-05-16 DIAGNOSIS — M545 Low back pain, unspecified: Secondary | ICD-10-CM | POA: Diagnosis not present

## 2013-05-16 DIAGNOSIS — M25559 Pain in unspecified hip: Secondary | ICD-10-CM | POA: Diagnosis not present

## 2013-05-17 ENCOUNTER — Ambulatory Visit
Admission: RE | Admit: 2013-05-17 | Discharge: 2013-05-17 | Disposition: A | Discharge status: Home / Self Care | Payer: Medicare Other | Source: Ambulatory Visit

## 2013-05-17 DIAGNOSIS — Z1231 Encounter for screening mammogram for malignant neoplasm of breast: Secondary | ICD-10-CM

## 2013-05-18 ENCOUNTER — Other Ambulatory Visit: Payer: Self-pay | Admitting: Internal Medicine

## 2013-05-18 DIAGNOSIS — R262 Difficulty in walking, not elsewhere classified: Secondary | ICD-10-CM | POA: Diagnosis not present

## 2013-05-18 DIAGNOSIS — M545 Low back pain, unspecified: Secondary | ICD-10-CM | POA: Diagnosis not present

## 2013-05-18 DIAGNOSIS — M25559 Pain in unspecified hip: Secondary | ICD-10-CM | POA: Diagnosis not present

## 2013-05-18 DIAGNOSIS — R928 Other abnormal and inconclusive findings on diagnostic imaging of breast: Secondary | ICD-10-CM

## 2013-05-23 ENCOUNTER — Ambulatory Visit
Admission: RE | Admit: 2013-05-23 | Discharge: 2013-05-23 | Disposition: A | Discharge status: Home / Self Care | Payer: Medicare Other | Source: Ambulatory Visit | Attending: Internal Medicine | Admitting: Internal Medicine

## 2013-05-23 DIAGNOSIS — R928 Other abnormal and inconclusive findings on diagnostic imaging of breast: Secondary | ICD-10-CM | POA: Diagnosis not present

## 2013-05-25 DIAGNOSIS — M545 Low back pain, unspecified: Secondary | ICD-10-CM | POA: Diagnosis not present

## 2013-05-25 DIAGNOSIS — M25559 Pain in unspecified hip: Secondary | ICD-10-CM | POA: Diagnosis not present

## 2013-05-25 DIAGNOSIS — R262 Difficulty in walking, not elsewhere classified: Secondary | ICD-10-CM | POA: Diagnosis not present

## 2013-06-06 DIAGNOSIS — S8000XA Contusion of unspecified knee, initial encounter: Secondary | ICD-10-CM | POA: Diagnosis not present

## 2013-06-06 DIAGNOSIS — M545 Low back pain, unspecified: Secondary | ICD-10-CM | POA: Diagnosis not present

## 2013-06-06 DIAGNOSIS — M25559 Pain in unspecified hip: Secondary | ICD-10-CM | POA: Diagnosis not present

## 2013-06-06 DIAGNOSIS — R262 Difficulty in walking, not elsewhere classified: Secondary | ICD-10-CM | POA: Diagnosis not present

## 2013-06-06 DIAGNOSIS — M76899 Other specified enthesopathies of unspecified lower limb, excluding foot: Secondary | ICD-10-CM | POA: Diagnosis not present

## 2013-06-08 DIAGNOSIS — M545 Low back pain, unspecified: Secondary | ICD-10-CM | POA: Diagnosis not present

## 2013-06-08 DIAGNOSIS — M25559 Pain in unspecified hip: Secondary | ICD-10-CM | POA: Diagnosis not present

## 2013-06-08 DIAGNOSIS — R262 Difficulty in walking, not elsewhere classified: Secondary | ICD-10-CM | POA: Diagnosis not present

## 2013-06-12 DIAGNOSIS — M545 Low back pain, unspecified: Secondary | ICD-10-CM | POA: Diagnosis not present

## 2013-06-12 DIAGNOSIS — M25559 Pain in unspecified hip: Secondary | ICD-10-CM | POA: Diagnosis not present

## 2013-06-12 DIAGNOSIS — R262 Difficulty in walking, not elsewhere classified: Secondary | ICD-10-CM | POA: Diagnosis not present

## 2013-06-14 DIAGNOSIS — R262 Difficulty in walking, not elsewhere classified: Secondary | ICD-10-CM | POA: Diagnosis not present

## 2013-06-14 DIAGNOSIS — M545 Low back pain, unspecified: Secondary | ICD-10-CM | POA: Diagnosis not present

## 2013-06-14 DIAGNOSIS — M25559 Pain in unspecified hip: Secondary | ICD-10-CM | POA: Diagnosis not present

## 2013-06-22 DIAGNOSIS — D235 Other benign neoplasm of skin of trunk: Secondary | ICD-10-CM | POA: Diagnosis not present

## 2013-06-22 DIAGNOSIS — L821 Other seborrheic keratosis: Secondary | ICD-10-CM | POA: Diagnosis not present

## 2013-07-22 DIAGNOSIS — Z23 Encounter for immunization: Secondary | ICD-10-CM | POA: Diagnosis not present

## 2013-08-15 DIAGNOSIS — I10 Essential (primary) hypertension: Secondary | ICD-10-CM | POA: Diagnosis not present

## 2013-08-15 DIAGNOSIS — R609 Edema, unspecified: Secondary | ICD-10-CM | POA: Diagnosis not present

## 2013-08-15 DIAGNOSIS — Z6831 Body mass index (BMI) 31.0-31.9, adult: Secondary | ICD-10-CM | POA: Diagnosis not present

## 2013-08-15 DIAGNOSIS — M7989 Other specified soft tissue disorders: Secondary | ICD-10-CM | POA: Diagnosis not present

## 2013-08-15 DIAGNOSIS — N182 Chronic kidney disease, stage 2 (mild): Secondary | ICD-10-CM | POA: Diagnosis not present

## 2013-08-15 DIAGNOSIS — S8990XA Unspecified injury of unspecified lower leg, initial encounter: Secondary | ICD-10-CM | POA: Diagnosis not present

## 2013-09-18 ENCOUNTER — Telehealth: Payer: Self-pay | Admitting: Gastroenterology

## 2013-09-19 ENCOUNTER — Other Ambulatory Visit: Payer: Self-pay

## 2013-09-19 NOTE — Telephone Encounter (Signed)
Called [facility] at the number that was given and attempted to do PA on Dexilant. Insurance company states that the medication is approved still until 2015. The prescription renewed itself until the following year. Patient should not have any problem getting prescription according to insurance company. Called patient and left her a message for her to call us back.

## 2013-09-19 NOTE — Telephone Encounter (Signed)
Informed patient that there is no need for a PA at this time. Patient states she received a letter back in June stating that Dexilant may not be covered for the following year and to do a PA. Told patient to go to her pharmacy like she normally does and they will contact me in January usually by fax if it needs a PA. Pt agreed and verbalized understanding.

## 2013-09-20 DIAGNOSIS — E785 Hyperlipidemia, unspecified: Secondary | ICD-10-CM | POA: Diagnosis not present

## 2013-09-20 DIAGNOSIS — Z780 Asymptomatic menopausal state: Secondary | ICD-10-CM | POA: Diagnosis not present

## 2013-10-24 ENCOUNTER — Other Ambulatory Visit: Payer: Self-pay | Admitting: Internal Medicine

## 2013-10-24 DIAGNOSIS — R921 Mammographic calcification found on diagnostic imaging of breast: Secondary | ICD-10-CM

## 2013-11-24 ENCOUNTER — Ambulatory Visit
Admission: RE | Admit: 2013-11-24 | Discharge: 2013-11-24 | Disposition: A | Discharge status: Home / Self Care | Payer: Medicare Other | Source: Ambulatory Visit | Attending: Internal Medicine | Admitting: Internal Medicine

## 2013-11-24 DIAGNOSIS — R921 Mammographic calcification found on diagnostic imaging of breast: Secondary | ICD-10-CM

## 2013-11-24 DIAGNOSIS — R928 Other abnormal and inconclusive findings on diagnostic imaging of breast: Secondary | ICD-10-CM | POA: Diagnosis not present

## 2013-12-26 ENCOUNTER — Other Ambulatory Visit: Payer: Self-pay | Admitting: Gastroenterology

## 2013-12-27 NOTE — Telephone Encounter (Signed)
NEEDS OFFICE VISIT FOR ANY FURTHER REFILLS! 

## 2014-01-08 ENCOUNTER — Other Ambulatory Visit: Payer: Self-pay | Admitting: Gastroenterology

## 2014-01-08 DIAGNOSIS — R82998 Other abnormal findings in urine: Secondary | ICD-10-CM | POA: Diagnosis not present

## 2014-01-08 DIAGNOSIS — R300 Dysuria: Secondary | ICD-10-CM | POA: Diagnosis not present

## 2014-01-08 NOTE — Telephone Encounter (Signed)
NEEDS OFFICE VISIT FOR FURTHER REFILLS  

## 2014-01-28 ENCOUNTER — Other Ambulatory Visit: Payer: Self-pay | Admitting: Gastroenterology

## 2014-01-29 NOTE — Telephone Encounter (Signed)
NEEDS OFFICE VISIT FOR ANY FURTHER REFILLS! 

## 2014-02-12 ENCOUNTER — Other Ambulatory Visit: Payer: Self-pay | Admitting: Gastroenterology

## 2014-02-12 NOTE — Telephone Encounter (Signed)
NEEDS OFFICE VISIT FOR ANY FURTHER REFILLS! 

## 2014-02-15 DIAGNOSIS — F4389 Other reactions to severe stress: Secondary | ICD-10-CM | POA: Diagnosis not present

## 2014-02-15 DIAGNOSIS — R609 Edema, unspecified: Secondary | ICD-10-CM | POA: Diagnosis not present

## 2014-02-15 DIAGNOSIS — R002 Palpitations: Secondary | ICD-10-CM | POA: Insufficient documentation

## 2014-02-15 DIAGNOSIS — F438 Other reactions to severe stress: Secondary | ICD-10-CM | POA: Diagnosis not present

## 2014-02-15 DIAGNOSIS — K219 Gastro-esophageal reflux disease without esophagitis: Secondary | ICD-10-CM | POA: Diagnosis not present

## 2014-02-15 DIAGNOSIS — I498 Other specified cardiac arrhythmias: Secondary | ICD-10-CM | POA: Diagnosis not present

## 2014-02-15 DIAGNOSIS — I4510 Unspecified right bundle-branch block: Secondary | ICD-10-CM | POA: Diagnosis not present

## 2014-02-15 DIAGNOSIS — Z6832 Body mass index (BMI) 32.0-32.9, adult: Secondary | ICD-10-CM | POA: Diagnosis not present

## 2014-02-15 DIAGNOSIS — I10 Essential (primary) hypertension: Secondary | ICD-10-CM | POA: Diagnosis not present

## 2014-02-28 ENCOUNTER — Other Ambulatory Visit: Payer: Self-pay | Admitting: Gastroenterology

## 2014-03-02 ENCOUNTER — Telehealth: Payer: Self-pay | Admitting: Gastroenterology

## 2014-03-02 MED ORDER — DEXLANSOPRAZOLE 60 MG PO CPDR: DELAYED_RELEASE_CAPSULE | ORAL | Status: DC

## 2014-03-02 NOTE — Telephone Encounter (Signed)
Sent one refill to patient's pharmacy. Patient told to keep appt for any further refills.

## 2014-03-06 DIAGNOSIS — I10 Essential (primary) hypertension: Secondary | ICD-10-CM | POA: Diagnosis not present

## 2014-03-06 DIAGNOSIS — E785 Hyperlipidemia, unspecified: Secondary | ICD-10-CM | POA: Diagnosis not present

## 2014-03-13 DIAGNOSIS — Z0000 Encounter for general adult medical examination without abnormal findings: Secondary | ICD-10-CM | POA: Diagnosis not present

## 2014-03-13 DIAGNOSIS — E785 Hyperlipidemia, unspecified: Secondary | ICD-10-CM | POA: Diagnosis not present

## 2014-03-13 DIAGNOSIS — F329 Major depressive disorder, single episode, unspecified: Secondary | ICD-10-CM | POA: Diagnosis not present

## 2014-03-13 DIAGNOSIS — F3289 Other specified depressive episodes: Secondary | ICD-10-CM | POA: Diagnosis not present

## 2014-03-13 DIAGNOSIS — R609 Edema, unspecified: Secondary | ICD-10-CM | POA: Diagnosis not present

## 2014-03-13 DIAGNOSIS — Z1331 Encounter for screening for depression: Secondary | ICD-10-CM | POA: Diagnosis not present

## 2014-03-13 DIAGNOSIS — I498 Other specified cardiac arrhythmias: Secondary | ICD-10-CM | POA: Diagnosis not present

## 2014-03-13 DIAGNOSIS — I10 Essential (primary) hypertension: Secondary | ICD-10-CM | POA: Diagnosis not present

## 2014-03-13 DIAGNOSIS — R002 Palpitations: Secondary | ICD-10-CM | POA: Diagnosis not present

## 2014-03-13 DIAGNOSIS — Z124 Encounter for screening for malignant neoplasm of cervix: Secondary | ICD-10-CM | POA: Diagnosis not present

## 2014-03-13 DIAGNOSIS — R809 Proteinuria, unspecified: Secondary | ICD-10-CM | POA: Diagnosis not present

## 2014-03-14 DIAGNOSIS — Z1212 Encounter for screening for malignant neoplasm of rectum: Secondary | ICD-10-CM | POA: Diagnosis not present

## 2014-03-22 ENCOUNTER — Ambulatory Visit (INDEPENDENT_AMBULATORY_CARE_PROVIDER_SITE_OTHER): Payer: Medicare Other | Admitting: Gastroenterology

## 2014-03-22 ENCOUNTER — Encounter: Payer: Self-pay | Admitting: Gastroenterology

## 2014-03-22 VITALS — BP 110/58 | HR 60 | Ht 61.5 in | Wt 175.5 lb

## 2014-03-22 DIAGNOSIS — K219 Gastro-esophageal reflux disease without esophagitis: Secondary | ICD-10-CM | POA: Diagnosis not present

## 2014-03-22 DIAGNOSIS — R1319 Other dysphagia: Secondary | ICD-10-CM | POA: Diagnosis not present

## 2014-03-22 DIAGNOSIS — K5900 Constipation, unspecified: Secondary | ICD-10-CM

## 2014-03-22 MED ORDER — RANITIDINE HCL 150 MG PO TABS: ORAL_TABLET | ORAL | Status: DC

## 2014-03-22 MED ORDER — DEXLANSOPRAZOLE 60 MG PO CPDR: DELAYED_RELEASE_CAPSULE | ORAL | Status: DC

## 2014-03-22 NOTE — Addendum Note (Signed)
Addended by: Marzella Schlein on: 03/22/2014 09:18 AM   Modules accepted: Orders

## 2014-03-22 NOTE — Patient Instructions (Addendum)
We have sent the following medications to your pharmacy for you to pick up at your convenience: Dexilant and ranitidine.  Start over the counter Miralax mixing 17 grams in 8 oz of water 1-2 x daily for constipation.  You have been scheduled for a Barium Esophogram at [facility] (1st floor of the hospital) on 03/23/14 at 10:30am. Please arrive 15 minutes prior to your appointment for registration. Make certain not to have anything to eat or drink 6 hours prior to your test. If you need to reschedule for any reason, please contact radiology at [phone] to do so. __________________________________________________________________ A barium swallow is an examination that concentrates on views of the esophagus. This tends to be a double contrast exam (barium and two liquids which, when combined, create a gas to distend the wall of the oesophagus) or single contrast (non-ionic iodine based). The study is usually tailored to your symptoms so a good history is essential. Attention is paid during the study to the form, structure and configuration of the esophagus, looking for functional disorders (such as aspiration, dysphagia, achalasia, motility and reflux) EXAMINATION You may be asked to change into a gown, depending on the type of swallow being performed. A radiologist and radiographer will perform the procedure. The radiologist will advise you of the type of contrast selected for your procedure and direct you during the exam. You will be asked to stand, sit or lie in several different positions and to hold a small amount of fluid in your mouth before being asked to swallow while the imaging is performed .In some instances you may be asked to swallow barium coated marshmallows to assess the motility of a solid food bolus. The exam can be recorded as a digital or video fluoroscopy procedure. POST PROCEDURE It will take 1-2 days for the barium to pass through your system. To facilitate this, it is  important, unless otherwise directed, to increase your fluids for the next 24-48hrs and to resume your normal diet.  This test typically takes about 30 minutes to perform. __________________________________________________________________________________   cc: Crist Infante, MD

## 2014-03-22 NOTE — Progress Notes (Signed)
    History of Present Illness: This is a 73 year old female with chronic GERD. She has an intermittent choking or tightness sensation at the base of her neck every several weeks that she feels is related to reflux. She has had ongoing problems with constipation for years and generally has a bowel movement about every 3-4 days. Adjusting her diet, taking fiber supplements and increasing her water intake have not helped.  Current Medications, Allergies, Past Medical History, Past Surgical History, Family History and Social History were reviewed in [employer] record.  Physical Exam: General: Well developed , well nourished, no acute distress Head: Normocephalic and atraumatic Eyes:  sclerae anicteric, EOMI Ears: Normal auditory acuity Mouth: No deformity or lesions Lungs: Clear throughout to auscultation Heart: Regular rate and rhythm; no murmurs, rubs or bruits Abdomen: Soft, non tender and non distended. No masses, hepatosplenomegaly or hernias noted. Normal Bowel sounds Musculoskeletal: Symmetrical with no gross deformities  Pulses:  Normal pulses noted Extremities: No clubbing, cyanosis, edema or deformities noted Neurological: Alert oriented x 4, grossly nonfocal Psychological:  Alert and cooperative. Normal mood and affect  Assessment and Recommendations:  1. GERD. Possible LPR leading to tightness and choking sensation. EGD performed in 2013 was unremarkable. Rule out stricture, rule out oropharyngeal dysfunction. Continue lansoprazole 60 mg every morning and ranitidine 150 mg at bedtime. Schedule barium esophagram with tablet.  2. Chronic constipation. Begin MiraLax 1-3 times daily titrated for adequate bowel movements.  3. CRC screening, average risk. 10 year interval colonoscopy due in July 2016.

## 2014-03-23 ENCOUNTER — Ambulatory Visit (HOSPITAL_COMMUNITY): Payer: Medicare Other

## 2014-03-27 ENCOUNTER — Ambulatory Visit (HOSPITAL_COMMUNITY)
Admission: RE | Admit: 2014-03-27 | Discharge: 2014-03-27 | Disposition: A | Discharge status: Home / Self Care | Payer: Medicare Other | Source: Ambulatory Visit | Attending: Gastroenterology | Admitting: Gastroenterology

## 2014-03-27 DIAGNOSIS — R1310 Dysphagia, unspecified: Secondary | ICD-10-CM | POA: Insufficient documentation

## 2014-03-27 DIAGNOSIS — R1319 Other dysphagia: Secondary | ICD-10-CM

## 2014-03-27 DIAGNOSIS — K219 Gastro-esophageal reflux disease without esophagitis: Secondary | ICD-10-CM | POA: Diagnosis not present

## 2014-04-09 DIAGNOSIS — H5231 Anisometropia: Secondary | ICD-10-CM | POA: Diagnosis not present

## 2014-04-09 DIAGNOSIS — H2510 Age-related nuclear cataract, unspecified eye: Secondary | ICD-10-CM | POA: Diagnosis not present

## 2014-04-09 DIAGNOSIS — H524 Presbyopia: Secondary | ICD-10-CM | POA: Diagnosis not present

## 2014-04-09 DIAGNOSIS — H52229 Regular astigmatism, unspecified eye: Secondary | ICD-10-CM | POA: Diagnosis not present

## 2014-04-23 ENCOUNTER — Other Ambulatory Visit: Payer: Self-pay | Admitting: Internal Medicine

## 2014-04-23 DIAGNOSIS — R921 Mammographic calcification found on diagnostic imaging of breast: Secondary | ICD-10-CM

## 2014-05-24 ENCOUNTER — Ambulatory Visit
Admission: RE | Admit: 2014-05-24 | Discharge: 2014-05-24 | Disposition: A | Discharge status: Home / Self Care | Payer: Medicare Other | Source: Ambulatory Visit | Attending: Internal Medicine | Admitting: Internal Medicine

## 2014-05-24 DIAGNOSIS — R928 Other abnormal and inconclusive findings on diagnostic imaging of breast: Secondary | ICD-10-CM | POA: Diagnosis not present

## 2014-05-24 DIAGNOSIS — R921 Mammographic calcification found on diagnostic imaging of breast: Secondary | ICD-10-CM

## 2014-07-23 DIAGNOSIS — Z23 Encounter for immunization: Secondary | ICD-10-CM | POA: Diagnosis not present

## 2014-07-24 ENCOUNTER — Telehealth: Payer: Self-pay | Admitting: Gastroenterology

## 2014-07-24 MED ORDER — RANITIDINE HCL 150 MG PO TABS: ORAL_TABLET | ORAL | Status: DC

## 2014-07-24 MED ORDER — DEXLANSOPRAZOLE 60 MG PO CPDR: DELAYED_RELEASE_CAPSULE | ORAL | Status: DC

## 2014-07-24 NOTE — Telephone Encounter (Signed)
RX sent

## 2014-08-13 DIAGNOSIS — I10 Essential (primary) hypertension: Secondary | ICD-10-CM | POA: Diagnosis not present

## 2014-08-13 DIAGNOSIS — Z6834 Body mass index (BMI) 34.0-34.9, adult: Secondary | ICD-10-CM | POA: Diagnosis not present

## 2014-08-13 DIAGNOSIS — S8002XA Contusion of left knee, initial encounter: Secondary | ICD-10-CM | POA: Diagnosis not present

## 2014-08-13 DIAGNOSIS — S8992XA Unspecified injury of left lower leg, initial encounter: Secondary | ICD-10-CM | POA: Diagnosis not present

## 2014-08-13 DIAGNOSIS — M1712 Unilateral primary osteoarthritis, left knee: Secondary | ICD-10-CM | POA: Diagnosis not present

## 2014-08-13 DIAGNOSIS — M25562 Pain in left knee: Secondary | ICD-10-CM | POA: Diagnosis not present

## 2014-08-20 ENCOUNTER — Encounter: Payer: Self-pay | Admitting: Gastroenterology

## 2015-01-08 DIAGNOSIS — Z6833 Body mass index (BMI) 33.0-33.9, adult: Secondary | ICD-10-CM | POA: Diagnosis not present

## 2015-01-08 DIAGNOSIS — R0602 Shortness of breath: Secondary | ICD-10-CM | POA: Diagnosis not present

## 2015-01-08 DIAGNOSIS — I10 Essential (primary) hypertension: Secondary | ICD-10-CM | POA: Diagnosis not present

## 2015-02-20 DIAGNOSIS — Z6832 Body mass index (BMI) 32.0-32.9, adult: Secondary | ICD-10-CM | POA: Diagnosis not present

## 2015-02-20 DIAGNOSIS — R350 Frequency of micturition: Secondary | ICD-10-CM | POA: Diagnosis not present

## 2015-02-20 DIAGNOSIS — R300 Dysuria: Secondary | ICD-10-CM | POA: Diagnosis not present

## 2015-02-20 DIAGNOSIS — N760 Acute vaginitis: Secondary | ICD-10-CM | POA: Diagnosis not present

## 2015-03-07 ENCOUNTER — Encounter: Payer: Self-pay | Admitting: Gastroenterology

## 2015-03-14 ENCOUNTER — Encounter: Payer: Self-pay | Admitting: Gastroenterology

## 2015-03-17 ENCOUNTER — Other Ambulatory Visit: Payer: Self-pay | Admitting: Gastroenterology

## 2015-03-29 ENCOUNTER — Other Ambulatory Visit: Payer: Self-pay | Admitting: Gastroenterology

## 2015-04-03 DIAGNOSIS — N390 Urinary tract infection, site not specified: Secondary | ICD-10-CM | POA: Diagnosis not present

## 2015-04-03 DIAGNOSIS — R8299 Other abnormal findings in urine: Secondary | ICD-10-CM | POA: Diagnosis not present

## 2015-04-03 DIAGNOSIS — I10 Essential (primary) hypertension: Secondary | ICD-10-CM | POA: Diagnosis not present

## 2015-04-03 DIAGNOSIS — E785 Hyperlipidemia, unspecified: Secondary | ICD-10-CM | POA: Diagnosis not present

## 2015-04-10 DIAGNOSIS — R350 Frequency of micturition: Secondary | ICD-10-CM | POA: Diagnosis not present

## 2015-04-10 DIAGNOSIS — F430 Acute stress reaction: Secondary | ICD-10-CM | POA: Diagnosis not present

## 2015-04-10 DIAGNOSIS — K5900 Constipation, unspecified: Secondary | ICD-10-CM | POA: Insufficient documentation

## 2015-04-10 DIAGNOSIS — Z780 Asymptomatic menopausal state: Secondary | ICD-10-CM | POA: Diagnosis not present

## 2015-04-10 DIAGNOSIS — R001 Bradycardia, unspecified: Secondary | ICD-10-CM | POA: Diagnosis not present

## 2015-04-10 DIAGNOSIS — Z1389 Encounter for screening for other disorder: Secondary | ICD-10-CM | POA: Diagnosis not present

## 2015-04-10 DIAGNOSIS — R809 Proteinuria, unspecified: Secondary | ICD-10-CM | POA: Diagnosis not present

## 2015-04-10 DIAGNOSIS — N182 Chronic kidney disease, stage 2 (mild): Secondary | ICD-10-CM | POA: Diagnosis not present

## 2015-04-10 DIAGNOSIS — Z0000 Encounter for general adult medical examination without abnormal findings: Secondary | ICD-10-CM | POA: Diagnosis not present

## 2015-04-10 DIAGNOSIS — N760 Acute vaginitis: Secondary | ICD-10-CM | POA: Diagnosis not present

## 2015-04-10 DIAGNOSIS — Z1231 Encounter for screening mammogram for malignant neoplasm of breast: Secondary | ICD-10-CM | POA: Diagnosis not present

## 2015-04-10 DIAGNOSIS — R600 Localized edema: Secondary | ICD-10-CM | POA: Diagnosis not present

## 2015-04-10 DIAGNOSIS — Z6832 Body mass index (BMI) 32.0-32.9, adult: Secondary | ICD-10-CM | POA: Diagnosis not present

## 2015-04-21 ENCOUNTER — Other Ambulatory Visit: Payer: Self-pay | Admitting: Gastroenterology

## 2015-04-25 ENCOUNTER — Other Ambulatory Visit: Payer: Self-pay | Admitting: Internal Medicine

## 2015-04-25 DIAGNOSIS — R921 Mammographic calcification found on diagnostic imaging of breast: Secondary | ICD-10-CM

## 2015-05-07 ENCOUNTER — Ambulatory Visit (AMBULATORY_SURGERY_CENTER): Payer: Self-pay

## 2015-05-07 VITALS — Ht 61.0 in | Wt 169.8 lb

## 2015-05-07 DIAGNOSIS — Z1211 Encounter for screening for malignant neoplasm of colon: Secondary | ICD-10-CM

## 2015-05-07 MED ORDER — SUPREP BOWEL PREP KIT 17.5-3.13-1.6 GM/177ML PO SOLN: 1.0000 | Freq: Once | ORAL | Status: DC

## 2015-05-07 NOTE — Progress Notes (Signed)
No allergies to eggs or soy No diet/weight loss meds No home oxygen No past problems with anesthesia; PONV with general anesthesia  Has email  Emmi instructions given for colonoscopy 

## 2015-05-21 ENCOUNTER — Encounter: Payer: Self-pay | Admitting: Gastroenterology

## 2015-05-21 ENCOUNTER — Ambulatory Visit (AMBULATORY_SURGERY_CENTER): Payer: Medicare Other | Admitting: Gastroenterology

## 2015-05-21 VITALS — BP 134/67 | HR 55 | Temp 96.5°F | Resp 16 | Ht 61.0 in | Wt 169.0 lb

## 2015-05-21 DIAGNOSIS — I10 Essential (primary) hypertension: Secondary | ICD-10-CM | POA: Diagnosis not present

## 2015-05-21 DIAGNOSIS — Z1211 Encounter for screening for malignant neoplasm of colon: Secondary | ICD-10-CM

## 2015-05-21 DIAGNOSIS — D122 Benign neoplasm of ascending colon: Secondary | ICD-10-CM

## 2015-05-21 DIAGNOSIS — G4733 Obstructive sleep apnea (adult) (pediatric): Secondary | ICD-10-CM | POA: Diagnosis not present

## 2015-05-21 DIAGNOSIS — K219 Gastro-esophageal reflux disease without esophagitis: Secondary | ICD-10-CM | POA: Diagnosis not present

## 2015-05-21 DIAGNOSIS — F410 Panic disorder [episodic paroxysmal anxiety] without agoraphobia: Secondary | ICD-10-CM | POA: Diagnosis not present

## 2015-05-21 MED ORDER — SODIUM CHLORIDE 0.9 % IV SOLN: 500.0000 mL | INTRAVENOUS | Status: DC

## 2015-05-21 MED ORDER — FLEET ENEMA 7-19 GM/118ML RE ENEM
1.0000 | ENEMA | Freq: Once | RECTAL | Status: AC
  Administered 2015-05-21: 1 via RECTAL

## 2015-05-21 NOTE — Op Note (Signed)
[place]  [address]. [place], [address]   COLONOSCOPY PROCEDURE REPORT  PATIENT: Linda [name], Linda [name]  MR#: [id number] BIRTHDATE: 1941/04/11 , 74  yrs. old GENDER: female ENDOSCOPIST: Ladene Artist, MD, [facility] REFERRED YZ:JQDU Perini, M.D. PROCEDURE DATE:  05/21/2015 PROCEDURE:   Colonoscopy, screening and Colonoscopy with snare polypectomy First Screening Colonoscopy - Avg.  risk and is 50 yrs.  old or older - No.  Prior Negative Screening - Now for repeat screening. 10 or more years since last screening  History of Adenoma - Now for follow-up colonoscopy & has been > or = to 3 yrs.  N/A  Polyps removed today? Yes ASA CLASS:   Class II INDICATIONS:Screening for colonic neoplasia and Colorectal Neoplasm Risk Assessment for this procedure is average risk. MEDICATIONS: Monitored anesthesia care, Propofol 200 mg IV, and lidocaine 140 mg IV DESCRIPTION OF PROCEDURE:   After the risks benefits and alternatives of the procedure were thoroughly explained, informed consent was obtained.  The digital rectal exam revealed no abnormalities of the rectum.   The LB [id number]  endoscope was introduced through the anus and advanced to the cecum, which was identified by both the appendix and ileocecal valve. No adverse events experienced.   The quality of the prep was good.  (Suprep was used)  The instrument was then slowly withdrawn as the colon was fully examined. Estimated blood loss is zero unless otherwise noted in this procedure report.    COLON FINDINGS: A sessile polyp measuring 6 mm in size was found in the ascending colon.  A polypectomy was performed with a cold snare.  The resection was complete, the polyp tissue was completely retrieved and sent to histology.   The examination was otherwise normal.  Retroflexed views revealed no abnormalities. The time to cecum = 3.0 Withdrawal time = 10.9   The scope was withdrawn and the procedure  completed. COMPLICATIONS: There were no immediate complications.  ENDOSCOPIC IMPRESSION: 1.   Sessile polyp in the ascending colon; polypectomy performed with a cold snare 2.   The examination was otherwise normal  RECOMMENDATIONS: 1.  Await pathology results 2.  Repeat colonoscopy in 5 years if polyp adenomatous; otherwise, given your age, you will not need another colonoscopy for colon cancer screening or polyp surveillance.  These types of tests usually stop around the age 44.  eSigned:  Ladene Artist, MD, [facility] 05/21/2015 8:53 AM

## 2015-05-21 NOTE — Progress Notes (Signed)
Report to PACU, RN, vss, BBS= Clear.  

## 2015-05-21 NOTE — Progress Notes (Signed)
Called to room to assist during endoscopic procedure.  Patient ID and intended procedure confirmed with present staff. Received instructions for my participation in the procedure from the performing physician.  

## 2015-05-21 NOTE — Patient Instructions (Signed)
YOU HAD AN ENDOSCOPIC PROCEDURE TODAY AT THE  ENDOSCOPY CENTER:   Refer to the procedure report that was given to you for any specific questions about what was found during the examination.  If the procedure report does not answer your questions, please call your gastroenterologist to clarify.  If you requested that your care partner not be given the details of your procedure findings, then the procedure report has been included in a sealed envelope for you to review at your convenience later.  YOU SHOULD EXPECT: Some feelings of bloating in the abdomen. Passage of more gas than usual.  Walking can help get rid of the air that was put into your GI tract during the procedure and reduce the bloating. If you had a lower endoscopy (such as a colonoscopy or flexible sigmoidoscopy) you may notice spotting of blood in your stool or on the toilet paper. If you underwent a bowel prep for your procedure, you may not have a normal bowel movement for a few days.  Please Note:  You might notice some irritation and congestion in your nose or some drainage.  This is from the oxygen used during your procedure.  There is no need for concern and it should clear up in a day or so.  SYMPTOMS TO REPORT IMMEDIATELY:   Following lower endoscopy (colonoscopy or flexible sigmoidoscopy):  Excessive amounts of blood in the stool  Significant tenderness or worsening of abdominal pains  Swelling of the abdomen that is new, acute  Fever of 100F or higher   For urgent or emergent issues, a gastroenterologist can be reached at any hour by calling [phone].   DIET: Your first meal following the procedure should be a small meal and then it is ok to progress to your normal diet. Heavy or fried foods are harder to digest and may make you feel nauseous or bloated.  Likewise, meals heavy in dairy and vegetables can increase bloating.  Drink plenty of fluids but you should avoid alcoholic beverages for 24  hours.  ACTIVITY:  You should plan to take it easy for the rest of today and you should NOT DRIVE or use heavy machinery until tomorrow (because of the sedation medicines used during the test).    FOLLOW UP: Our staff will call the number listed on your records the next business day following your procedure to check on you and address any questions or concerns that you may have regarding the information given to you following your procedure. If we do not reach you, we will leave a message.  However, if you are feeling well and you are not experiencing any problems, there is no need to return our call.  We will assume that you have returned to your regular daily activities without incident.  If any biopsies were taken you will be contacted by phone or by letter within the next 1-3 weeks.  Please call us at [phone] if you have not heard about the biopsies in 3 weeks.    SIGNATURES/CONFIDENTIALITY: You and/or your care partner have signed paperwork which will be entered into your electronic medical record.  These signatures attest to the fact that that the information above on your After Visit Summary has been reviewed and is understood.  Full responsibility of the confidentiality of this discharge information lies with you and/or your care-partner.   Resume medications. Information given on polyps with discharge instructions. 

## 2015-05-22 ENCOUNTER — Telehealth: Payer: Self-pay | Admitting: *Deleted

## 2015-05-22 NOTE — Telephone Encounter (Signed)
  Follow up Call-  Call back number 05/21/2015  Post procedure Call Back phone  # [phone]  Permission to leave phone message Yes     Patient questions:  Do you have a fever, pain , or abdominal swelling? No. Pain Score  0 *  Have you tolerated food without any problems? Yes.    Have you been able to return to your normal activities? Yes.    Do you have any questions about your discharge instructions: Diet   No. Medications  No. Follow up visit  No.  Do you have questions or concerns about your Care? No.  Actions: * If pain score is 4 or above: No action needed, pain <4.

## 2015-05-23 ENCOUNTER — Other Ambulatory Visit: Payer: Self-pay | Admitting: Gastroenterology

## 2015-05-27 ENCOUNTER — Ambulatory Visit
Admission: RE | Admit: 2015-05-27 | Discharge: 2015-05-27 | Disposition: A | Discharge status: Home / Self Care | Payer: Medicare Other | Source: Ambulatory Visit | Attending: Internal Medicine | Admitting: Internal Medicine

## 2015-05-27 DIAGNOSIS — R921 Mammographic calcification found on diagnostic imaging of breast: Secondary | ICD-10-CM

## 2015-05-28 ENCOUNTER — Encounter: Payer: Self-pay | Admitting: Gastroenterology

## 2015-06-01 ENCOUNTER — Encounter: Payer: Self-pay | Admitting: Gastroenterology

## 2015-06-04 DIAGNOSIS — H5212 Myopia, left eye: Secondary | ICD-10-CM | POA: Diagnosis not present

## 2015-06-04 DIAGNOSIS — H25811 Combined forms of age-related cataract, right eye: Secondary | ICD-10-CM | POA: Diagnosis not present

## 2015-06-04 DIAGNOSIS — H52222 Regular astigmatism, left eye: Secondary | ICD-10-CM | POA: Diagnosis not present

## 2015-06-04 DIAGNOSIS — H5201 Hypermetropia, right eye: Secondary | ICD-10-CM | POA: Diagnosis not present

## 2015-06-23 ENCOUNTER — Other Ambulatory Visit: Payer: Self-pay | Admitting: Gastroenterology

## 2015-07-13 DIAGNOSIS — Z23 Encounter for immunization: Secondary | ICD-10-CM | POA: Diagnosis not present

## 2015-07-16 ENCOUNTER — Telehealth: Payer: Self-pay | Admitting: Podiatry

## 2015-07-16 NOTE — Telephone Encounter (Signed)
Called Pt and left voicemail too R/S her appt

## 2015-07-17 ENCOUNTER — Ambulatory Visit (INDEPENDENT_AMBULATORY_CARE_PROVIDER_SITE_OTHER): Payer: Medicare Other | Admitting: Podiatry

## 2015-07-17 VITALS — BP 139/70 | HR 69 | Resp 16 | Ht 61.0 in | Wt 168.0 lb

## 2015-07-17 DIAGNOSIS — L600 Ingrowing nail: Secondary | ICD-10-CM | POA: Diagnosis not present

## 2015-07-17 DIAGNOSIS — M2042 Other hammer toe(s) (acquired), left foot: Secondary | ICD-10-CM | POA: Diagnosis not present

## 2015-07-17 NOTE — Patient Instructions (Signed)

## 2015-07-17 NOTE — Progress Notes (Signed)
Subjective:     Patient ID: Linda [name], female   DOB: [birth year], 73 y.o.   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 For the toenail I've recommended removal and I explained surgery and reviewed risk. Patient wants surgery understanding risk and today I infiltrated 60 mg Xylocaine Marcaine mixture remove the hallux nail exposed matrix and applied phenol 5 applications 30 seconds followed by alcohol lavaged and for the toe I debrided lesion padded and discussed arthroplasty that may need to be done at one point in the future

## 2015-07-17 NOTE — Progress Notes (Signed)
   Subjective:    Patient ID: Linda [name], female    DOB: 1941/06/23, 74 y.o.   MRN: [id number]  HPI Patient presents with a nail problem on their Left foot, great toe, nail discoloration, thickened nail, nail growing sideways. This has been going on for the past 2 years.   Review of Systems  Constitutional: Positive for diaphoresis and activity change.  Eyes: Positive for itching and visual disturbance.  Gastrointestinal: Positive for nausea and constipation.  Endocrine: Positive for heat intolerance.  Musculoskeletal: Positive for back pain and arthralgias.  Allergic/Immunologic: Positive for environmental allergies.  Psychiatric/Behavioral: The patient is nervous/anxious.   All other systems reviewed and are negative.      Objective:   Physical Exam        Assessment & Plan:

## 2015-07-18 ENCOUNTER — Telehealth: Payer: Self-pay | Admitting: *Deleted

## 2015-07-18 NOTE — Telephone Encounter (Signed)
Pt states she's not sure which soak to do.  I told pt to take her shower like she usually does, but before she got out to wet a clean washcloth and squirt antibacterial soap on it, and gently rub over the removed toenail area, rinse well and cover with an antibiotic ointment bandaid, perform daily until the area gets a dry hard scab without redness, pain or drainage, this should occur at about 4 weeks.  Pt states understanding.

## 2015-07-18 NOTE — Telephone Encounter (Signed)
Called patient at [phone] (Home #) to check to see how they were feeling from their ingrown toenail procedure that was performed on Thursday, July 17, 2015. Pt stated, "Doing fine and has one sore spot. Has soaked toe.

## 2015-07-19 ENCOUNTER — Ambulatory Visit: Payer: Medicare Other | Admitting: Podiatry

## 2015-07-19 DIAGNOSIS — L600 Ingrowing nail: Secondary | ICD-10-CM

## 2015-10-16 DIAGNOSIS — L718 Other rosacea: Secondary | ICD-10-CM | POA: Diagnosis not present

## 2015-11-12 DIAGNOSIS — H25813 Combined forms of age-related cataract, bilateral: Secondary | ICD-10-CM | POA: Diagnosis not present

## 2015-11-12 DIAGNOSIS — H01009 Unspecified blepharitis unspecified eye, unspecified eyelid: Secondary | ICD-10-CM | POA: Diagnosis not present

## 2015-12-24 DIAGNOSIS — H01009 Unspecified blepharitis unspecified eye, unspecified eyelid: Secondary | ICD-10-CM | POA: Diagnosis not present

## 2015-12-24 DIAGNOSIS — H01001 Unspecified blepharitis right upper eyelid: Secondary | ICD-10-CM | POA: Diagnosis not present

## 2015-12-24 DIAGNOSIS — H25813 Combined forms of age-related cataract, bilateral: Secondary | ICD-10-CM | POA: Diagnosis not present

## 2016-01-01 ENCOUNTER — Other Ambulatory Visit: Payer: Self-pay | Admitting: Gastroenterology

## 2016-03-11 DIAGNOSIS — I10 Essential (primary) hypertension: Secondary | ICD-10-CM | POA: Diagnosis not present

## 2016-03-11 DIAGNOSIS — R350 Frequency of micturition: Secondary | ICD-10-CM | POA: Diagnosis not present

## 2016-03-11 DIAGNOSIS — N390 Urinary tract infection, site not specified: Secondary | ICD-10-CM | POA: Diagnosis not present

## 2016-03-11 DIAGNOSIS — R8290 Unspecified abnormal findings in urine: Secondary | ICD-10-CM | POA: Diagnosis not present

## 2016-03-11 DIAGNOSIS — R600 Localized edema: Secondary | ICD-10-CM | POA: Diagnosis not present

## 2016-03-12 DIAGNOSIS — D225 Melanocytic nevi of trunk: Secondary | ICD-10-CM | POA: Diagnosis not present

## 2016-03-12 DIAGNOSIS — L820 Inflamed seborrheic keratosis: Secondary | ICD-10-CM | POA: Diagnosis not present

## 2016-04-15 DIAGNOSIS — H5212 Myopia, left eye: Secondary | ICD-10-CM | POA: Diagnosis not present

## 2016-04-15 DIAGNOSIS — H3540 Unspecified peripheral retinal degeneration: Secondary | ICD-10-CM | POA: Diagnosis not present

## 2016-04-15 DIAGNOSIS — H52222 Regular astigmatism, left eye: Secondary | ICD-10-CM | POA: Diagnosis not present

## 2016-04-15 DIAGNOSIS — H5201 Hypermetropia, right eye: Secondary | ICD-10-CM | POA: Diagnosis not present

## 2016-04-28 ENCOUNTER — Other Ambulatory Visit: Payer: Self-pay | Admitting: Gastroenterology

## 2016-05-07 DIAGNOSIS — E784 Other hyperlipidemia: Secondary | ICD-10-CM | POA: Diagnosis not present

## 2016-05-07 DIAGNOSIS — I10 Essential (primary) hypertension: Secondary | ICD-10-CM | POA: Diagnosis not present

## 2016-05-07 DIAGNOSIS — N390 Urinary tract infection, site not specified: Secondary | ICD-10-CM | POA: Diagnosis not present

## 2016-05-07 DIAGNOSIS — R8299 Other abnormal findings in urine: Secondary | ICD-10-CM | POA: Diagnosis not present

## 2016-05-14 DIAGNOSIS — Z1389 Encounter for screening for other disorder: Secondary | ICD-10-CM | POA: Diagnosis not present

## 2016-05-14 DIAGNOSIS — E784 Other hyperlipidemia: Secondary | ICD-10-CM | POA: Diagnosis not present

## 2016-05-14 DIAGNOSIS — R001 Bradycardia, unspecified: Secondary | ICD-10-CM | POA: Diagnosis not present

## 2016-05-14 DIAGNOSIS — Z0000 Encounter for general adult medical examination without abnormal findings: Secondary | ICD-10-CM | POA: Diagnosis not present

## 2016-05-14 DIAGNOSIS — R600 Localized edema: Secondary | ICD-10-CM | POA: Diagnosis not present

## 2016-05-14 DIAGNOSIS — N182 Chronic kidney disease, stage 2 (mild): Secondary | ICD-10-CM | POA: Diagnosis not present

## 2016-05-14 DIAGNOSIS — R0789 Other chest pain: Secondary | ICD-10-CM | POA: Diagnosis not present

## 2016-05-14 DIAGNOSIS — R8299 Other abnormal findings in urine: Secondary | ICD-10-CM | POA: Diagnosis not present

## 2016-05-14 DIAGNOSIS — I10 Essential (primary) hypertension: Secondary | ICD-10-CM | POA: Diagnosis not present

## 2016-05-14 DIAGNOSIS — I4510 Unspecified right bundle-branch block: Secondary | ICD-10-CM | POA: Diagnosis not present

## 2016-05-14 DIAGNOSIS — R350 Frequency of micturition: Secondary | ICD-10-CM | POA: Diagnosis not present

## 2016-05-14 DIAGNOSIS — Z1231 Encounter for screening mammogram for malignant neoplasm of breast: Secondary | ICD-10-CM | POA: Diagnosis not present

## 2016-05-14 DIAGNOSIS — R002 Palpitations: Secondary | ICD-10-CM | POA: Diagnosis not present

## 2016-05-14 DIAGNOSIS — F339 Major depressive disorder, recurrent, unspecified: Secondary | ICD-10-CM | POA: Insufficient documentation

## 2016-05-14 DIAGNOSIS — Z6832 Body mass index (BMI) 32.0-32.9, adult: Secondary | ICD-10-CM | POA: Diagnosis not present

## 2016-05-15 ENCOUNTER — Other Ambulatory Visit: Payer: Self-pay | Admitting: Internal Medicine

## 2016-05-15 DIAGNOSIS — Z1212 Encounter for screening for malignant neoplasm of rectum: Secondary | ICD-10-CM | POA: Diagnosis not present

## 2016-05-15 DIAGNOSIS — Z1231 Encounter for screening mammogram for malignant neoplasm of breast: Secondary | ICD-10-CM

## 2016-05-19 DIAGNOSIS — I739 Peripheral vascular disease, unspecified: Secondary | ICD-10-CM | POA: Diagnosis not present

## 2016-06-02 ENCOUNTER — Ambulatory Visit
Admission: RE | Admit: 2016-06-02 | Discharge: 2016-06-02 | Disposition: A | Discharge status: Home / Self Care | Payer: Medicare Other | Source: Ambulatory Visit | Attending: Internal Medicine | Admitting: Internal Medicine

## 2016-06-02 DIAGNOSIS — Z1231 Encounter for screening mammogram for malignant neoplasm of breast: Secondary | ICD-10-CM

## 2016-06-03 DIAGNOSIS — M545 Low back pain: Secondary | ICD-10-CM | POA: Diagnosis not present

## 2016-06-03 DIAGNOSIS — M25562 Pain in left knee: Secondary | ICD-10-CM | POA: Diagnosis not present

## 2016-06-03 DIAGNOSIS — M25561 Pain in right knee: Secondary | ICD-10-CM | POA: Diagnosis not present

## 2016-06-12 ENCOUNTER — Other Ambulatory Visit: Payer: Self-pay | Admitting: Gastroenterology

## 2016-06-15 ENCOUNTER — Encounter (INDEPENDENT_AMBULATORY_CARE_PROVIDER_SITE_OTHER): Payer: Self-pay

## 2016-06-15 ENCOUNTER — Ambulatory Visit (HOSPITAL_COMMUNITY): Payer: Medicare Other | Attending: Orthopaedic Surgery | Admitting: Physical Therapy

## 2016-06-15 DIAGNOSIS — M25562 Pain in left knee: Secondary | ICD-10-CM

## 2016-06-15 DIAGNOSIS — M25561 Pain in right knee: Secondary | ICD-10-CM | POA: Insufficient documentation

## 2016-06-15 DIAGNOSIS — M6281 Muscle weakness (generalized): Secondary | ICD-10-CM | POA: Diagnosis not present

## 2016-06-15 DIAGNOSIS — R2681 Unsteadiness on feet: Secondary | ICD-10-CM | POA: Insufficient documentation

## 2016-06-15 NOTE — Therapy (Signed)
[place], [place], [address] Phone: [phone]   Fax:  [phone]  Physical Therapy Evaluation  Patient Details  Name: Linda [name] MRN: [id number] Date of Birth: 06-23-1941 Referring Provider: Dr. Lesly Rubenstein  Encounter Date: 06/15/2016      PT End of Session - 06/15/16 1115    Visit Number 1   Number of Visits 8   Date for PT Re-Evaluation 07/15/16   Authorization Type Medicare   Authorization - Visit Number 1   Authorization - Number of Visits 8   PT Start Time 1033   PT Stop Time 1115   PT Time Calculation (min) 42 min   Activity Tolerance Patient tolerated treatment well   Behavior During Therapy [facility] for tasks assessed/performed      Past Medical History:  Diagnosis Date  . Anxiety   . Candida esophagitis   . Chronic kidney disease   . DDD (degenerative disc disease)   . Depression   . Erosive gastritis   . GERD (gastroesophageal reflux disease)   . Hiatal hernia   . High cholesterol   . History of anal fissures   . Hypertension   . Obesity   . Osteoarthritis   . Panic disorder   . Sleep apnea    Does not need CPAP anymore    Past Surgical History:  Procedure Laterality Date  . ABDOMINAL HYSTERECTOMY  1987   LSO / menorrhagia & leiomyomata  . APPENDECTOMY    . BREAST SURGERY  1989   benign tumor left breast  . CHOLECYSTECTOMY  2001  . TUBAL LIGATION      There were no vitals filed for this visit.       Subjective Assessment - 06/15/16 1033    Subjective [name] is a 75 yo female who states that she began having knee pain bilaterally for the past two months.  She states that the right knee is more painful than the left.  She states that the MD stated that she has some OA but not alot therefore he is referring her to physical therapy.     Pertinent History OA,    How long can you sit comfortably? no problem    How long can you stand comfortably? 30 minutes     How long can you walk comfortably? uses no assistive device.  Pt has not been very active so she is able to do what she wants to she just has increased pain    Patient Stated Goals No pain, to start being more active.     Currently in Pain? Yes   Pain Score 5    Pain Location Knee   Pain Orientation Right   Pain Descriptors / Indicators Aching;Throbbing   Pain Type Chronic pain   Pain Onset More than a month ago   Pain Frequency Intermittent   Aggravating Factors  walking    Pain Relieving Factors sitting    Effect of Pain on Daily Activities increase.             [facility] PT Assessment - 06/15/16 0001      Assessment   Medical Diagnosis Bilateral knee pain    Referring Provider Dr. Lesly Rubenstein   Onset Date/Surgical Date --  04/15/2016   Next MD Visit 07/03/2016   Prior Therapy none     Precautions   Precautions None     Restrictions   Weight Bearing Restrictions No     Balance  Screen   Has the patient fallen in the past 6 months No   Has the patient had a decrease in activity level because of a fear of falling?  Yes   Is the patient reluctant to leave their home because of a fear of falling?  No     Home Ecologist residence   Home Access Stairs to enter   Entrance Stairs-Number of Steps 3   Entrance Stairs-Rails Right     Prior Function   Level of Independence Independent   Leisure walk but she has not been doing this of late due to her knee pain,, gym      Cognition   Overall Cognitive Status Within Functional Limits for tasks assessed     Observation/Other Assessments   Focus on Therapeutic Outcomes (FOTO)  47     Functional Tests   Functional tests Single leg stance;Sit to Stand     Single Leg Stance   Comments RT 6 seconds; LT 4 seconds      Sit to Stand   Comments 5 x in 10:27      Posture/Postural Control   Posture/Postural Control Postural limitations   Postural Limitations Rounded Shoulders;Decreased  lumbar lordosis;Increased thoracic kyphosis;Flexed trunk     ROM / Strength   AROM / PROM / Strength AROM;Strength     AROM   AROM Assessment Site Knee   Right/Left Knee Right;Left   Right Knee Extension 7   Right Knee Flexion 130   Left Knee Extension 6   Left Knee Flexion 130     Strength   Strength Assessment Site Hip;Knee;Ankle   Right/Left Hip Right;Left   Right Hip Flexion 5/5   Right Hip Extension 3/5   Right Hip ABduction 4+/5   Left Hip Flexion 5/5   Left Hip Extension 3/5   Left Hip ABduction 4/5   Right/Left Knee Right;Left   Right Knee Flexion 5/5   Right Knee Extension 5/5   Left Knee Flexion 5/5   Left Knee Extension 5/5   Right/Left Ankle Right;Left   Right Ankle Dorsiflexion 5/5   Right Ankle Plantar Flexion 4/5   Left Ankle Dorsiflexion 5/5   Left Ankle Plantar Flexion 4+/5     Flexibility   Soft Tissue Assessment /Muscle Length yes   Hamstrings Rt 127; Lt 143   Piriformis RT 50; Lt 50                   [facility] Adult PT Treatment/Exercise - 06/15/16 0001      Exercises   Exercises Knee/Hip     Knee/Hip Exercises: Stretches   Active Hamstring Stretch Both;2 reps;30 seconds   Active Hamstring Stretch Limitations supine   Other Knee/Hip Stretches back extension and scapular retraction x 10      Knee/Hip Exercises: Standing   SLS 3 x each      Knee/Hip Exercises: Supine   Quad Sets Strengthening;Both;10 reps                PT Education - 06/15/16 1112    Education provided Yes   Education Details The importance of posture and HEP   Person(s) Educated Patient   Methods Explanation   Comprehension Verbalized understanding          PT Short Term Goals - 06/15/16 1301      PT SHORT TERM GOAL #1   Title Pt to verbalize the importance of proper posture in knee pain.    Time  1   Period Weeks   Status New     PT SHORT TERM GOAL #2   Title Pt to demonstrate a five degree improvement in her right  hamstring length to  decrease stress on the knee joint    Time 2   Period Weeks   Status New     PT SHORT TERM GOAL #3   Title Pt to demonstrate 1/2 grade improvement in bilateral gluteal maximus musculature to allow pt to go up and down five steps without increased pain    Time 2   Period Weeks   Status New     PT SHORT TERM GOAL #4   Title Pt singl leg stance on both legs to be improved to 10 seconds to reduce risk of falling    Time 2   Period Weeks           PT Long Term Goals - 06/15/16 1305      PT LONG TERM GOAL #1   Title Pt Rt hamstring length to be improved by 10 degrees for decreased stress on her right knee.   Time 4   Period Weeks   Status New     PT LONG TERM GOAL #2   Title Pt to state that her pain in her knees has decreased to a maximum of 2/10 with functional activity    Time 4   Period Weeks   Status New     PT LONG TERM GOAL #3   Title Pt to state that she is walking 30 minutes three times a week    Time 4   Period Weeks   Status New     PT LONG TERM GOAL #4   Title Pt  bilateral gluteal strength to be increased by one grade to allow pt to go up and down 10 steps without increased knee pain    Time 10   Period Days   Status New     PT LONG TERM GOAL #5   Title Pt single leg stance bilaterally to be 15 seconds to allow pt to feel confident walking on uneven ground.    Time 4   Period Weeks   Status New               Plan - 06/15/16 1116    Clinical Impression Statement Ms. Mietus is a 75 yo female who has been referred to skilled physical therapy for bilateral knee pain.  Examination demonstrates postrual changes causing increased weightbearing on the anterior aspect of the pt knees, decreased balance, tight hamstrings and weakened musculature.  Ms. Rosano will benefit from skilled physical therapy to address these issues decrease her pain and improve her functional ability.    Rehab Potential Good   PT Frequency 2x / week   PT Duration 4 weeks    PT Treatment/Interventions ADLs/Self Care Home Management;Cryotherapy;Air traffic controller;Therapeutic activities;Therapeutic exercise;Balance training;Neuromuscular re-education;Patient/family education;Manual techniques   PT Next Visit Plan Begin rocker board, heel raises, minisquats, slow sit to stand and sittng piriformis stretch.    PT Home Exercise Plan given    Consulted and Agree with Plan of Care Patient      Patient will benefit from skilled therapeutic intervention in order to improve the following deficits and impairments:  Abnormal gait, Decreased activity tolerance, Decreased balance, Decreased strength, Difficulty walking, Impaired flexibility, Postural dysfunction, Pain  Visit Diagnosis: Pain in right knee  Pain in left knee  Muscle weakness (generalized)  Unsteadiness on feet  G-Codes - 06/15/16 1310    Functional Limitation Mobility: Walking and moving around   Mobility: Walking and Moving Around Current Status [phone]) At least 40 percent but less than 60 percent impaired, limited or restricted   Mobility: Walking and Moving Around Goal Status [phone]) At least 20 percent but less than 40 percent impaired, limited or restricted       Problem List Patient Active Problem List   Diagnosis Date Noted  . Hypertension   . High cholesterol   . [place], PT CLT [phone] 06/15/2016, 1:13 PM  [facility] [address] [place], [place], [address] Phone: [phone]   Fax:  [phone]  Name: OLENA ROLLER MRN: [id number] Date of Birth: 04/12/1941

## 2016-06-15 NOTE — Patient Instructions (Addendum)
Backward Bend (Standing)    Arch backward to make hollow of back deeper. Hold _2-3___ seconds. Repeat _10___ times per set. Do _1___ sets per session. Do __2__ sessions per day.  http://orth.exer.us/178   Copyright  VHI. All rights reserved.  Scapular Retraction (Standing)    With arms at sides, pinch shoulder blades together. Repeat _10___ times per set. Do ___1_ sets per session. Do __3_ sessions per day.  http://orth.exer.us/944   Copyright  VHI. All rights reserved.  Stretching: Hamstring (Supine)    Supporting right thigh behind knee, slowly straighten knee until stretch is felt in back of thigh. Hold _30___ seconds. Repeat _3___ times per set. Do _1___ sets per session. Do _2___ sessions per day.  http://orth.exer.us/656   Copyright  VHI. All rights reserved.  Strengthening: Quadriceps Set    Hold 5__ seconds. Repeat _10___ times per set. Do _1___ sets per session. Do _3___ sessions per day.  http://orth.exer.us/602   Copyright  VHI. All rights reserved.

## 2016-06-17 ENCOUNTER — Ambulatory Visit (HOSPITAL_COMMUNITY): Payer: Medicare Other

## 2016-06-17 DIAGNOSIS — M25561 Pain in right knee: Secondary | ICD-10-CM | POA: Diagnosis not present

## 2016-06-17 DIAGNOSIS — M6281 Muscle weakness (generalized): Secondary | ICD-10-CM | POA: Diagnosis not present

## 2016-06-17 DIAGNOSIS — M25562 Pain in left knee: Secondary | ICD-10-CM

## 2016-06-17 DIAGNOSIS — R2681 Unsteadiness on feet: Secondary | ICD-10-CM

## 2016-06-17 NOTE — Therapy (Signed)
[name], [place], [address] Phone: [phone]   Fax:  [phone]  Physical Therapy Treatment  Patient Details  Name: Linda [name] MRN: [id number] Date of Birth: [birth year] Referring Provider: Dr. Lesly [name]  Encounter Date: 06/17/2016      PT End of Session - 06/17/16 1113    Visit Number 2   Number of Visits 8   Date for PT Re-Evaluation 07/15/16   Authorization Type Medicare   Authorization - Visit Number 2   Authorization - Number of Visits 8   PT Start Time [id number]   PT Stop Time 1155   PT Time Calculation (min) 44 min   Activity Tolerance Patient tolerated treatment well   Behavior During Therapy [facility] for tasks assessed/performed      Past Medical History:  Diagnosis Date  . Anxiety   . Candida esophagitis   . Chronic kidney disease   . DDD (degenerative disc disease)   . Depression   . Erosive gastritis   . GERD (gastroesophageal reflux disease)   . Hiatal hernia   . High cholesterol   . History of anal fissures   . Hypertension   . Obesity   . Osteoarthritis   . Panic disorder   . Sleep apnea    Does not need CPAP anymore    Past Surgical History:  Procedure Laterality Date  . ABDOMINAL HYSTERECTOMY  1987   LSO / menorrhagia & leiomyomata  . APPENDECTOMY    . BREAST SURGERY  1989   benign tumor left breast  . CHOLECYSTECTOMY  2001  . TUBAL LIGATION      There were no vitals filed for this visit.      Subjective Assessment - 06/17/16 1113    Subjective Pt stated her knees are feeling good today, has began the HEP 2x a day since initial eval.     Pertinent History OA,    Patient Stated Goals No pain, to start being more active.     Currently in Pain? No/denies                         [facility] Adult PT Treatment/Exercise - 06/17/16 0001      Posture/Postural Control   Posture/Postural Control Postural limitations   Postural Limitations Rounded  Shoulders;Decreased lumbar lordosis;Increased thoracic kyphosis;Flexed trunk     Knee/Hip Exercises: Stretches   Active Hamstring Stretch Both;3 reps;30 seconds   Active Hamstring Stretch Limitations supine initially with hand behind thigh then with rope   Piriformis Stretch 2 reps;30 seconds   Piriformis Stretch Limitations seated   Other Knee/Hip Stretches back extension and scapular retraction x 10      Knee/Hip Exercises: Standing   Heel Raises Both;10 reps   Heel Raises Limitations heel and toe raises   Functional Squat 10 reps   Functional Squat Limitations mod tactile and verbal cueing for proper form; utilized chair behind   Diplomatic Services operational officer 2 minutes   Rocker Board Limitations R/L with UE A   SLS 5" Bil LE max of 3 attempts     Knee/Hip Exercises: Seated   Other Seated Knee/Hip Exercises scapular retraction 2x 10 reps   Sit to Sand 10 reps;without UE support  eccentric control     Knee/Hip Exercises: Supine   Quad Sets Strengthening;Both;10 reps   Quad Sets Limitations 5" holds with tactile cueing  PT Short Term Goals - 06/15/16 1301      PT SHORT TERM GOAL #1   Title Pt to verbalize the importance of proper posture in knee pain.    Time 1   Period Weeks   Status New     PT SHORT TERM GOAL #2   Title Pt to demonstrate a five degree improvement in her right  hamstring length to decrease stress on the knee joint    Time 2   Period Weeks   Status New     PT SHORT TERM GOAL #3   Title Pt to demonstrate 1/2 grade improvement in bilateral gluteal maximus musculature to allow pt to go up and down five steps without increased pain    Time 2   Period Weeks   Status New     PT SHORT TERM GOAL #4   Title Pt singl leg stance on both legs to be improved to 10 seconds to reduce risk of falling    Time 2   Period Weeks           PT Long Term Goals - 06/15/16 1305      PT LONG TERM GOAL #1   Title Pt Rt hamstring length to be improved by  10 degrees for decreased stress on her right knee.   Time 4   Period Weeks   Status New     PT LONG TERM GOAL #2   Title Pt to state that her pain in her knees has decreased to a maximum of 2/10 with functional activity    Time 4   Period Weeks   Status New     PT LONG TERM GOAL #3   Title Pt to state that she is walking 30 minutes three times a week    Time 4   Period Weeks   Status New     PT LONG TERM GOAL #4   Title Pt  bilateral gluteal strength to be increased by one grade to allow pt to go up and down 10 steps without increased knee pain    Time 10   Period Days   Status New     PT LONG TERM GOAL #5   Title Pt single leg stance bilaterally to be 15 seconds to allow pt to feel confident walking on uneven ground.    Time 4   Period Weeks   Status New               Plan - 06/17/16 1119    Clinical Impression Statement Reviewed goals, compliance and assure correct form and technique with HEP and copy of eval given to pt.  Session focus on improving awareness of posture to improve weight distribution wiht gait for pain control anterior knees and LE strengthening wtih therapist facilitation for proper form and technique with therex.  EOS no reports of increased pain.   Rehab Potential Good   PT Frequency 2x / week   PT Duration 4 weeks   PT Treatment/Interventions ADLs/Self Care Home Management;Cryotherapy;Air traffic controller;Therapeutic activities;Therapeutic exercise;Balance training;Neuromuscular re-education;Patient/family education;Manual techniques   PT Next Visit Plan Continue with current PT POC.  Next session continue CKC exercises and begin posture strengthening with therband and LE strengthening.   PT Home Exercise Plan Reviewed HEP, no additional exercises given this session.  HEP included, quad sets, h/s st, scapular retraction, standing lumbar extension      Patient will benefit from skilled therapeutic  intervention in order to improve the following  deficits and impairments:  Abnormal gait, Decreased activity tolerance, Decreased balance, Decreased strength, Difficulty walking, Impaired flexibility, Postural dysfunction, Pain  Visit Diagnosis: Pain in right knee  Pain in left knee  Muscle weakness (generalized)  Unsteadiness on feet     Problem List Patient Active Problem List   Diagnosis Date Noted  . Hypertension   . High cholesterol   . [place], [place]; [place]  Aldona Lento 06/17/2016, 12:01 PM  [place], [place], [address] Phone: [phone]   Fax:  [phone]  Name: Linda [name] MRN: [id number] Date of Birth: [birth year]

## 2016-06-23 ENCOUNTER — Ambulatory Visit (HOSPITAL_COMMUNITY): Payer: Medicare Other | Attending: Physician Assistant | Admitting: Physical Therapy

## 2016-06-23 DIAGNOSIS — M6281 Muscle weakness (generalized): Secondary | ICD-10-CM

## 2016-06-23 DIAGNOSIS — M25562 Pain in left knee: Secondary | ICD-10-CM | POA: Diagnosis not present

## 2016-06-23 DIAGNOSIS — M25561 Pain in right knee: Secondary | ICD-10-CM | POA: Diagnosis not present

## 2016-06-23 DIAGNOSIS — R2681 Unsteadiness on feet: Secondary | ICD-10-CM | POA: Diagnosis not present

## 2016-06-23 NOTE — Therapy (Signed)
[place], [place], [address] Phone: [phone]   Fax:  [phone]  Physical Therapy Treatment  Patient Details  Name: Linda [name] MRN: [id number] Date of Birth: [birth year] Referring Provider: Dr. Lesly Rubenstein  Encounter Date: 06/23/2016      PT End of Session - 06/23/16 0947    Visit Number 3   Number of Visits 8   Date for PT Re-Evaluation 07/15/16   Authorization Type Medicare   Authorization - Visit Number 3   Authorization - Number of Visits 8   PT Start Time 0900   PT Stop Time 0945   PT Time Calculation (min) 45 min   Activity Tolerance Patient tolerated treatment well   Behavior During Therapy [facility] for tasks assessed/performed      Past Medical History:  Diagnosis Date  . Anxiety   . Candida esophagitis   . Chronic kidney disease   . DDD (degenerative disc disease)   . Depression   . Erosive gastritis   . GERD (gastroesophageal reflux disease)   . Hiatal hernia   . High cholesterol   . History of anal fissures   . Hypertension   . Obesity   . Osteoarthritis   . Panic disorder   . Sleep apnea    Does not need CPAP anymore    Past Surgical History:  Procedure Laterality Date  . ABDOMINAL HYSTERECTOMY  1987   LSO / menorrhagia & leiomyomata  . APPENDECTOMY    . BREAST SURGERY  1989   benign tumor left breast  . CHOLECYSTECTOMY  2001  . TUBAL LIGATION      There were no vitals filed for this visit.      Subjective Assessment - 06/23/16 0907    Subjective Pt states her pain is really low, only a 2/10 today.     Currently in Pain? Yes   Pain Score 2    Pain Location Knee   Pain Orientation Right   Pain Descriptors / Indicators Aching;Throbbing                         [facility] Adult PT Treatment/Exercise - 06/23/16 0001      Knee/Hip Exercises: Stretches   Active Hamstring Stretch Both;3 reps;30 seconds   Active Hamstring Stretch Limitations  standing 12" step   Other Knee/Hip Stretches postural 3 with RTB 10 reps each     Knee/Hip Exercises: Standing   Heel Raises Both;10 reps   Heel Raises Limitations heel and toe raises   Forward Lunges Both;10 reps   Forward Lunges Limitations 4" each LE   Lateral Step Up Both;10 reps;Step Height: 4";Hand Hold: 1   Lateral Step Up Limitations 4"   Forward Step Up Both;10 reps;Step Height: 4";Hand Hold: 1   Forward Step Up Limitations 4"   Functional Squat 10 reps   Functional Squat Limitations mod tactile and verbal cueing for proper form; utilized chair behind   Rocker Board 2 minutes   Rocker Board Limitations R/L with UE A   SLS 5"Rt, 7" Lt LE max of 5 attempts     Knee/Hip Exercises: Seated   Sit to Sand 10 reps;without UE support                  PT Short Term Goals - 06/15/16 1301      PT SHORT TERM GOAL #1   Title Pt to verbalize the importance of proper posture in  knee pain.    Time 1   Period Weeks   Status New     PT SHORT TERM GOAL #2   Title Pt to demonstrate a five degree improvement in her right  hamstring length to decrease stress on the knee joint    Time 2   Period Weeks   Status New     PT SHORT TERM GOAL #3   Title Pt to demonstrate 1/2 grade improvement in bilateral gluteal maximus musculature to allow pt to go up and down five steps without increased pain    Time 2   Period Weeks   Status New     PT SHORT TERM GOAL #4   Title Pt singl leg stance on both legs to be improved to 10 seconds to reduce risk of falling    Time 2   Period Weeks           PT Long Term Goals - 06/15/16 1305      PT LONG TERM GOAL #1   Title Pt Rt hamstring length to be improved by 10 degrees for decreased stress on her right knee.   Time 4   Period Weeks   Status New     PT LONG TERM GOAL #2   Title Pt to state that her pain in her knees has decreased to a maximum of 2/10 with functional activity    Time 4   Period Weeks   Status New     PT LONG  TERM GOAL #3   Title Pt to state that she is walking 30 minutes three times a week    Time 4   Period Weeks   Status New     PT LONG TERM GOAL #4   Title Pt  bilateral gluteal strength to be increased by one grade to allow pt to go up and down 10 steps without increased knee pain    Time 10   Period Days   Status New     PT LONG TERM GOAL #5   Title Pt single leg stance bilaterally to be 15 seconds to allow pt to feel confident walking on uneven ground.    Time 4   Period Weeks   Status New               Plan - 06/23/16 0947    Clinical Impression Statement Pt overall doing well with minimal pain.  Able to progress to functional strengthening exercises including step ups and lunges.  Also added postural strengthening exercises utilizing theraband.  Pt required verbal and tactile cues to complete exercises in correct form and posture.    Rehab Potential Good   PT Frequency 2x / week   PT Duration 4 weeks   PT Treatment/Interventions ADLs/Self Care Home Management;Cryotherapy;Air traffic controller;Therapeutic activities;Therapeutic exercise;Balance training;Neuromuscular re-education;Patient/family education;Manual techniques   PT Next Visit Plan Continue with current PT POC.   PT Home Exercise Plan HEP issued at initial evaluation: quad sets, h/s st, scapular retraction, standing lumbar extension      Patient will benefit from skilled therapeutic intervention in order to improve the following deficits and impairments:  Abnormal gait, Decreased activity tolerance, Decreased balance, Decreased strength, Difficulty walking, Impaired flexibility, Postural dysfunction, Pain  Visit Diagnosis: Pain in right knee  Pain in left knee  Muscle weakness (generalized)  Unsteadiness on feet     Problem List Patient Active Problem List   Diagnosis Date Noted  . Hypertension   . High cholesterol   .  Anxiety     Teena Irani,  PTA/CLT [phone]  06/23/2016, 9:51 AM  [facility] [address] [place], [place], [address] Phone: [phone]   Fax:  [phone]  Name: Linda [name] MRN: [id number] Date of Birth: 1941-05-31

## 2016-06-24 ENCOUNTER — Ambulatory Visit (HOSPITAL_COMMUNITY): Payer: Medicare Other

## 2016-06-24 DIAGNOSIS — M25561 Pain in right knee: Secondary | ICD-10-CM

## 2016-06-24 DIAGNOSIS — R2681 Unsteadiness on feet: Secondary | ICD-10-CM

## 2016-06-24 DIAGNOSIS — M6281 Muscle weakness (generalized): Secondary | ICD-10-CM | POA: Diagnosis not present

## 2016-06-24 DIAGNOSIS — M25562 Pain in left knee: Secondary | ICD-10-CM

## 2016-06-24 NOTE — Patient Instructions (Signed)
Toe / Heel Raise (Standing)    Standing with support, raise heels, then rock back on heels and raise toes. Repeat 10-20 times.  Copyright  VHI. All rights reserved.

## 2016-06-24 NOTE — Therapy (Signed)
[place], [place], [address] Phone: [phone]   Fax:  [phone]  Physical Therapy Treatment  Patient Details  Name: Linda [name] MRN: [id number] Date of Birth: 08-09-1941 Referring Provider: Dr. Lesly Rubenstein  Encounter Date: 06/24/2016      PT End of Session - 06/24/16 1033    Visit Number 4   Number of Visits 8   Date for PT Re-Evaluation 07/15/16   Authorization Type Medicare   Authorization - Visit Number 4   Authorization - Number of Visits 8   PT Start Time [id number]   PT Stop Time 1115   PT Time Calculation (min) 45 min   Activity Tolerance Patient tolerated treatment well   Behavior During Therapy [facility] for tasks assessed/performed      Past Medical History:  Diagnosis Date  . Anxiety   . Candida esophagitis   . Chronic kidney disease   . DDD (degenerative disc disease)   . Depression   . Erosive gastritis   . GERD (gastroesophageal reflux disease)   . Hiatal hernia   . High cholesterol   . History of anal fissures   . Hypertension   . Obesity   . Osteoarthritis   . Panic disorder   . Sleep apnea    Does not need CPAP anymore    Past Surgical History:  Procedure Laterality Date  . ABDOMINAL HYSTERECTOMY  1987   LSO / menorrhagia & leiomyomata  . APPENDECTOMY    . BREAST SURGERY  1989   benign tumor left breast  . CHOLECYSTECTOMY  2001  . TUBAL LIGATION      There were no vitals filed for this visit.      Subjective Assessment - 06/24/16 1030    Subjective Pt stated she had slight pain earlier this morning, currently pain free.  Reports compliance with HEP daily    Pertinent History OA,    Patient Stated Goals No pain, to start being more active.     Currently in Pain? No/denies              [facility] Adult PT Treatment/Exercise - 06/24/16 0001      Posture/Postural Control   Posture/Postural Control Postural limitations   Postural Limitations Rounded  Shoulders;Decreased lumbar lordosis;Increased thoracic kyphosis;Flexed trunk     Knee/Hip Exercises: Stretches   Other Knee/Hip Stretches postural 3 with RTB 10 reps each     Knee/Hip Exercises: Standing   Heel Raises 15 reps;3 seconds   Heel Raises Limitations heel and toe raises   Forward Lunges Both;15 reps   Forward Lunges Limitations 4" each LE   Lateral Step Up Both;Step Height: 4";Hand Hold: 1;15 reps   Lateral Step Up Limitations 4"   Forward Step Up Both;Step Height: 4";Hand Hold: 1;15 reps   Forward Step Up Limitations 4"   Functional Squat 10 reps   Functional Squat Limitations mod tactile and verbal cueing for proper form; utilized chair behind with shoulder flexion to increase weight bearing towards heels   Other Standing Knee Exercises 3D hip excursion with UE movement (shoulder flexion with squats; ABD with frontal plane; head and arm rotation with transverse plane   Other Standing Knee Exercises shoulders up, back and down 10x     Knee/Hip Exercises: Seated   Sit to Sand 10 reps;without UE support                  PT Short Term Goals - 06/15/16 1301  PT SHORT TERM GOAL #1   Title Pt to verbalize the importance of proper posture in knee pain.    Time 1   Period Weeks   Status New     PT SHORT TERM GOAL #2   Title Pt to demonstrate a five degree improvement in her right  hamstring length to decrease stress on the knee joint    Time 2   Period Weeks   Status New     PT SHORT TERM GOAL #3   Title Pt to demonstrate 1/2 grade improvement in bilateral gluteal maximus musculature to allow pt to go up and down five steps without increased pain    Time 2   Period Weeks   Status New     PT SHORT TERM GOAL #4   Title Pt singl leg stance on both legs to be improved to 10 seconds to reduce risk of falling    Time 2   Period Weeks           PT Long Term Goals - 06/15/16 1305      PT LONG TERM GOAL #1   Title Pt Rt hamstring length to be improved  by 10 degrees for decreased stress on her right knee.   Time 4   Period Weeks   Status New     PT LONG TERM GOAL #2   Title Pt to state that her pain in her knees has decreased to a maximum of 2/10 with functional activity    Time 4   Period Weeks   Status New     PT LONG TERM GOAL #3   Title Pt to state that she is walking 30 minutes three times a week    Time 4   Period Weeks   Status New     PT LONG TERM GOAL #4   Title Pt  bilateral gluteal strength to be increased by one grade to allow pt to go up and down 10 steps without increased knee pain    Time 10   Period Days   Status New     PT LONG TERM GOAL #5   Title Pt single leg stance bilaterally to be 15 seconds to allow pt to feel confident walking on uneven ground.    Time 4   Period Weeks   Status New               Plan - 06/24/16 1151    Clinical Impression Statement Session focus on functional strengthening and posture awareness/strengthening therex.  Pt able to perform majority of therex with min verbal and tactile cueing for correct form, technqiue and posture.  Able to increase reps with some therex to improve activity tolerance and functional strengtheing.  Pt did c/o slight increase Lt knee with stair training activities, able to tolerate with reports of pain resolved at EOS.    Rehab Potential Good   PT Frequency 2x / week   PT Duration 4 weeks   PT Treatment/Interventions ADLs/Self Care Home Management;Cryotherapy;Air traffic controller;Therapeutic activities;Therapeutic exercise;Balance training;Neuromuscular re-education;Patient/family education;Manual techniques   PT Next Visit Plan Continue with current PT POC.  Begin step down training when appropriate and progress balance and posture strengthening.     PT Home Exercise Plan HEP issued at initial evaluation: quad sets, h/s st, scapular retraction, standing lumbar extension      Patient will benefit from skilled  therapeutic intervention in order to improve the following deficits and impairments:  Abnormal gait, Decreased activity  tolerance, Decreased balance, Decreased strength, Difficulty walking, Impaired flexibility, Postural dysfunction, Pain  Visit Diagnosis: Pain in right knee  Pain in left knee  Unsteadiness on feet  Muscle weakness (generalized)     Problem List Patient Active Problem List   Diagnosis Date Noted  . Hypertension   . High cholesterol   . [place], LPTA; [place]  Aldona Lento 06/24/2016, 1:02 PM  [place], [place], [address] Phone: [phone]   Fax:  [phone]  Name: Linda [name] MRN: [id number] Date of Birth: 1941/05/23

## 2016-06-30 ENCOUNTER — Ambulatory Visit (HOSPITAL_COMMUNITY): Payer: Medicare Other | Admitting: Physical Therapy

## 2016-06-30 DIAGNOSIS — R2681 Unsteadiness on feet: Secondary | ICD-10-CM

## 2016-06-30 DIAGNOSIS — M6281 Muscle weakness (generalized): Secondary | ICD-10-CM

## 2016-06-30 DIAGNOSIS — M25562 Pain in left knee: Secondary | ICD-10-CM

## 2016-06-30 DIAGNOSIS — M25561 Pain in right knee: Secondary | ICD-10-CM | POA: Diagnosis not present

## 2016-06-30 NOTE — Therapy (Signed)
[place] [name], [place], [address] Phone: [phone]   Fax:  [phone]  Physical Therapy Treatment  Patient Details  Name: Linda [name] MRN: [id number] Date of Birth: December 04, 1940 Referring Provider: Dr. Lesly Rubenstein  Encounter Date: 06/30/2016      PT End of Session - 06/30/16 1145    Visit Number 5   Number of Visits 8   Date for PT Re-Evaluation 07/15/16   Authorization Type Medicare   Authorization - Visit Number 5   Authorization - Number of Visits 8   PT Start Time 1119   PT Stop Time 1200   PT Time Calculation (min) 41 min   Activity Tolerance Patient tolerated treatment well   Behavior During Therapy [facility] for tasks assessed/performed      Past Medical History:  Diagnosis Date  . Anxiety   . Candida esophagitis   . Chronic kidney disease   . DDD (degenerative disc disease)   . Depression   . Erosive gastritis   . GERD (gastroesophageal reflux disease)   . Hiatal hernia   . High cholesterol   . History of anal fissures   . Hypertension   . Obesity   . Osteoarthritis   . Panic disorder   . Sleep apnea    Does not need CPAP anymore    Past Surgical History:  Procedure Laterality Date  . ABDOMINAL HYSTERECTOMY  1987   LSO / menorrhagia & leiomyomata  . APPENDECTOMY    . BREAST SURGERY  1989   benign tumor left breast  . CHOLECYSTECTOMY  2001  . TUBAL LIGATION      There were no vitals filed for this visit.      Subjective Assessment - 06/30/16 1118    Subjective Pt stated she had slight pain earlier this morning, currently pain free.  Reports compliance with HEP daily    Pertinent History OA,    Patient Stated Goals No pain, to start being more active.     Currently in Pain? No/denies                         [facility] Adult PT Treatment/Exercise - 06/30/16 0001      Exercises   Exercises Knee/Hip     Knee/Hip Exercises: Stretches   Active Hamstring  Stretch Both;3 reps;30 seconds   Active Hamstring Stretch Limitations supine    Quad Stretch Both;3 reps;30 seconds   Gastroc Stretch Both;3 reps;30 seconds   Gastroc Stretch Limitations slant board      Knee/Hip Exercises: Standing   Heel Raises Both;15 reps   Knee Flexion Strengthening;Both;10 reps   Knee Flexion Limitations 4#   Forward Lunges Both;15 reps   Lateral Step Up Both;10 reps;Step Height: 6"   Lateral Step Up Limitations 6"   Forward Step Up Both;10 reps;Step Height: 8"   Forward Step Up Limitations 6"   Functional Squat 15 reps   Rocker Board 2 minutes   SLS x 3 each      Knee/Hip Exercises: Supine   Quad Sets Both;10 reps     Knee/Hip Exercises: Prone   Hip Extension Strengthening;Both;10 reps                  PT Short Term Goals - 06/30/16 1151      PT SHORT TERM GOAL #1   Title Pt to verbalize the importance of proper posture in knee pain.    Time 1  Period Weeks   Status On-going     PT SHORT TERM GOAL #2   Title Pt to demonstrate a five degree improvement in her right  hamstring length to decrease stress on the knee joint    Time 2   Period Weeks   Status Achieved     PT SHORT TERM GOAL #3   Title Pt to demonstrate 1/2 grade improvement in bilateral gluteal maximus musculature to allow pt to go up and down five steps without increased pain    Time 2   Period Weeks   Status On-going     PT SHORT TERM GOAL #4   Title Pt singl leg stance on both legs to be improved to 10 seconds to reduce risk of falling    Time 2   Period Weeks   Status Partially Met           PT Long Term Goals - 06/30/16 1152      PT LONG TERM GOAL #1   Title Pt Rt hamstring length to be improved by 10 degrees for decreased stress on her right knee.   Time 4   Period Weeks   Status On-going     PT LONG TERM GOAL #2   Title Pt to state that her pain in her knees has decreased to a maximum of 2/10 with functional activity    Time 4   Period Weeks    Status On-going     PT LONG TERM GOAL #3   Title Pt to state that she is walking 30 minutes three times a week    Time 4   Period Weeks   Status On-going     PT LONG TERM GOAL #4   Title Pt  bilateral gluteal strength to be increased by one grade to allow pt to go up and down 10 steps without increased knee pain    Time 10   Period Days   Status On-going     PT LONG TERM GOAL #5   Title Pt single leg stance bilaterally to be 15 seconds to allow pt to feel confident walking on uneven ground.    Time 4   Period Weeks   Status On-going               Plan - 06/30/16 1147    Clinical Impression Statement Added quadricep stretch to address forward flexed position; hip extension to address weak gluteal musculature and added 2' to step up for functional strengthening.  Pt needs constant manual and verbal cuing with exercises to keep good form    Rehab Potential Good   PT Frequency 2x / week   PT Duration 4 weeks   PT Treatment/Interventions ADLs/Self Care Home Management;Cryotherapy;Air traffic controller;Therapeutic activities;Therapeutic exercise;Balance training;Neuromuscular re-education;Patient/family education;Manual techniques   PT Next Visit Plan Continue with current PT POC.  Begin step down training when appropriate and progress balance and posture strengthening.     PT Home Exercise Plan HEP issued at initial evaluation: quad sets, h/s st, scapular retraction, standing lumbar extension      Patient will benefit from skilled therapeutic intervention in order to improve the following deficits and impairments:  Abnormal gait, Decreased activity tolerance, Decreased balance, Decreased strength, Difficulty walking, Impaired flexibility, Postural dysfunction, Pain  Visit Diagnosis: Pain in right knee  Pain in left knee  Unsteadiness on feet  Muscle weakness (generalized)     Problem List Patient Active Problem List   Diagnosis  Date Noted  .  Hypertension   . High cholesterol   . [place], PT CLT [phone] 06/30/2016, 12:00 PM  [place], [place], [address] Phone: [phone]   Fax:  [phone]  Name: Linda [name] MRN: [id number] Date of Birth: [birth year]

## 2016-07-02 ENCOUNTER — Ambulatory Visit (HOSPITAL_COMMUNITY): Payer: Medicare Other | Admitting: Physical Therapy

## 2016-07-02 DIAGNOSIS — M25562 Pain in left knee: Secondary | ICD-10-CM

## 2016-07-02 DIAGNOSIS — R2681 Unsteadiness on feet: Secondary | ICD-10-CM

## 2016-07-02 DIAGNOSIS — M25561 Pain in right knee: Secondary | ICD-10-CM | POA: Diagnosis not present

## 2016-07-02 DIAGNOSIS — M6281 Muscle weakness (generalized): Secondary | ICD-10-CM

## 2016-07-02 NOTE — Therapy (Signed)
[place], [place], [address] Phone: [phone]   Fax:  [phone]  Physical Therapy Treatment  Patient Details  Name: MARTIN SMEAL MRN: [id number] Date of Birth: January 18, 1941 Referring Provider: Dr. Lesly Rubenstein  Encounter Date: 07/02/2016      PT End of Session - 07/02/16 1140    Visit Number 6   Number of Visits 8   Date for PT Re-Evaluation 07/15/16   Authorization Type Medicare   Authorization - Visit Number 6   Authorization - Number of Visits 8   PT Start Time 1116   PT Stop Time 1200   PT Time Calculation (min) 44 min   Activity Tolerance Patient tolerated treatment well   Behavior During Therapy [facility] for tasks assessed/performed      Past Medical History:  Diagnosis Date  . Anxiety   . Candida esophagitis   . Chronic kidney disease   . DDD (degenerative disc disease)   . Depression   . Erosive gastritis   . GERD (gastroesophageal reflux disease)   . Hiatal hernia   . High cholesterol   . History of anal fissures   . Hypertension   . Obesity   . Osteoarthritis   . Panic disorder   . Sleep apnea    Does not need CPAP anymore    Past Surgical History:  Procedure Laterality Date  . ABDOMINAL HYSTERECTOMY  1987   LSO / menorrhagia & leiomyomata  . APPENDECTOMY    . BREAST SURGERY  1989   benign tumor left breast  . CHOLECYSTECTOMY  2001  . TUBAL LIGATION      There were no vitals filed for this visit.      Subjective Assessment - 07/02/16 1120    Subjective Pt states that her knees feel great today.    Pertinent History OA,    Patient Stated Goals No pain, to start being more active.     Currently in Pain? No/denies                         [facility] Adult PT Treatment/Exercise - 07/02/16 0001      Exercises   Exercises Knee/Hip     Knee/Hip Exercises: Stretches   Active Hamstring Stretch Both;3 reps;30 seconds   Active Hamstring Stretch  Limitations supine    Quad Stretch Both;3 reps;30 seconds   Gastroc Stretch Both;3 reps;30 seconds   Gastroc Stretch Limitations slant board      Knee/Hip Exercises: Standing   Heel Raises Both;15 reps   Knee Flexion --   Knee Flexion Limitations --   Forward Lunges Both;15 reps   Side Lunges Both;10 reps   Lateral Step Up Both;10 reps;Step Height: 6"   Lateral Step Up Limitations 6"   Forward Step Up Both;10 reps;Step Height: 8"   Forward Step Up Limitations 6"   Step Down Both;10 reps;Step Height: 4"   Functional Squat 15 reps   Rocker Board 2 minutes   SLS x 3 each Lr 6 seconds max; Lt max 4.      Knee/Hip Exercises: Supine   Quad Sets Both;10 reps     Knee/Hip Exercises: Prone   Hip Extension Strengthening;Both;10 reps                  PT Short Term Goals - 07/02/16 1152      PT SHORT TERM GOAL #1   Title Pt to verbalize the importance of  proper posture in knee pain.    Time 1   Period Weeks   Status Achieved     PT SHORT TERM GOAL #2   Title Pt to demonstrate a five degree improvement in her right  hamstring length to decrease stress on the knee joint    Time 2   Period Weeks   Status Achieved     PT SHORT TERM GOAL #3   Title Pt to demonstrate 1/2 grade improvement in bilateral gluteal maximus musculature to allow pt to go up and down five steps without increased pain    Time 2   Period Weeks   Status On-going     PT SHORT TERM GOAL #4   Title Pt singl leg stance on both legs to be improved to 10 seconds to reduce risk of falling    Time 2   Period Weeks   Status Partially Met           PT Long Term Goals - 07/02/16 1153      PT LONG TERM GOAL #1   Title Pt Rt hamstring length to be improved by 10 degrees for decreased stress on her right knee.   Time 4   Period Weeks   Status On-going     PT LONG TERM GOAL #2   Title Pt to state that her pain in her knees has decreased to a maximum of 2/10 with functional activity    Time 4    Period Weeks   Status Partially Met     PT LONG TERM GOAL #3   Title Pt to state that she is walking 30 minutes three times a week    Time 4   Period Weeks   Status On-going     PT LONG TERM GOAL #4   Title Pt  bilateral gluteal strength to be increased by one grade to allow pt to go up and down 10 steps without increased knee pain    Time 10   Period Days   Status On-going     PT LONG TERM GOAL #5   Title Pt single leg stance bilaterally to be 15 seconds to allow pt to feel confident walking on uneven ground.    Time 4   Period Weeks   Status On-going               Plan - 07/02/16 1140    Clinical Impression Statement Added step downs to pt program.  Pt continues to need verbal and manual cuing in order to completed exercises with correct technique.  She is still concerned about her balance.     Rehab Potential Good   PT Frequency 2x / week   PT Duration 4 weeks   PT Treatment/Interventions ADLs/Self Care Home Management;Cryotherapy;Air traffic controller;Therapeutic activities;Therapeutic exercise;Balance training;Neuromuscular re-education;Patient/family education;Manual techniques   PT Next Visit Plan begin tandem stance next treatement    PT Home Exercise Plan HEP issued at initial evaluation: quad sets, h/s st, scapular retraction, standing lumbar extension      Patient will benefit from skilled therapeutic intervention in order to improve the following deficits and impairments:  Abnormal gait, Decreased activity tolerance, Decreased balance, Decreased strength, Difficulty walking, Impaired flexibility, Postural dysfunction, Pain  Visit Diagnosis: Pain in right knee  Pain in left knee  Unsteadiness on feet  Muscle weakness (generalized)     Problem List Patient Active Problem List   Diagnosis Date Noted  . Hypertension   . High cholesterol   .  [place], PT CLT [phone] 07/02/2016, 12:03  PM  [place] [address] [place], [place], [address] Phone: [phone]   Fax:  [phone]  Name: VAANYA SHAMBAUGH MRN: [id number] Date of Birth: 05/15/1941

## 2016-07-06 ENCOUNTER — Ambulatory Visit (HOSPITAL_COMMUNITY): Payer: Medicare Other

## 2016-07-06 DIAGNOSIS — M25562 Pain in left knee: Secondary | ICD-10-CM | POA: Diagnosis not present

## 2016-07-06 DIAGNOSIS — R2681 Unsteadiness on feet: Secondary | ICD-10-CM | POA: Diagnosis not present

## 2016-07-06 DIAGNOSIS — M6281 Muscle weakness (generalized): Secondary | ICD-10-CM

## 2016-07-06 DIAGNOSIS — M25561 Pain in right knee: Secondary | ICD-10-CM

## 2016-07-06 NOTE — Therapy (Signed)
[place], [place], [address] Phone: [phone]   Fax:  [phone]  Physical Therapy Treatment  Patient Details  Name: Linda [name] MRN: [id number] Date of Birth: 1941/08/13 Referring Provider: Dr. Lesly Rubenstein  Encounter Date: 07/06/2016      PT End of Session - 07/06/16 1043    Visit Number 7   Number of Visits 8   Date for PT Re-Evaluation 07/15/16   Authorization Type Medicare (Trad)    Authorization - Visit Number 7   Authorization - Number of Visits 8   PT Start Time 8144   PT Stop Time 1111   PT Time Calculation (min) 40 min   Activity Tolerance Patient tolerated treatment well;Patient limited by fatigue   Behavior During Therapy [facility] for tasks assessed/performed      Past Medical History:  Diagnosis Date  . Anxiety   . Candida esophagitis   . Chronic kidney disease   . DDD (degenerative disc disease)   . Depression   . Erosive gastritis   . GERD (gastroesophageal reflux disease)   . Hiatal hernia   . High cholesterol   . History of anal fissures   . Hypertension   . Obesity   . Osteoarthritis   . Panic disorder   . Sleep apnea    Does not need CPAP anymore    Past Surgical History:  Procedure Laterality Date  . ABDOMINAL HYSTERECTOMY  1987   LSO / menorrhagia & leiomyomata  . APPENDECTOMY    . BREAST SURGERY  1989   benign tumor left breast  . CHOLECYSTECTOMY  2001  . TUBAL LIGATION      There were no vitals filed for this visit.      Subjective Assessment - 07/06/16 1033    Subjective Pt reports no pain in her knees today. She says her HEP is going well. She says she "didn't get into much" this past weekend.    Pertinent History OA,                          [facility] Adult PT Treatment/Exercise - 07/06/16 0001      Ambulation/Gait   Ambulation Distance (Feet) 675 Feet   Gait velocity 1.45ms     Exercises   Exercises Knee/Hip     Knee/Hip Exercises: Stretches   Active Hamstring Stretch 30 seconds;2 reps;Right;Left   Active Hamstring Stretch Limitations 12" riser   Quad Stretch Both;30 seconds;2 reps   Gastroc Stretch Both;30 seconds;2 reps   Gastroc Stretch Limitations slant board      Knee/Hip Exercises: Standing   Heel Raises Both;20 reps;2 sets   Lateral Step Up 10 reps;Step Height: 6";2 sets;Left;Right;Hand Hold: 1   Forward Step Up Both;10 reps;Step Height: 8";2 sets;Hand Hold: 1   Step Down Both;10 reps;Step Height: 4";2 sets;Hand Hold: 1   Functional Squat 2 sets;10 reps  Chair for feedback; VC to keep knees wide   SLS SLS: 10x10sec bilat  Tandem Stance: 2x30sec bilat (VC for upright posture)                 PT Education - 07/06/16 1043    Education provided No          PT Short Term Goals - 07/02/16 1152      PT SHORT TERM GOAL #1   Title Pt to verbalize the importance of proper posture in knee pain.    Time 1  Period Weeks   Status Achieved     PT SHORT TERM GOAL #2   Title Pt to demonstrate a five degree improvement in her right  hamstring length to decrease stress on the knee joint    Time 2   Period Weeks   Status Achieved     PT SHORT TERM GOAL #3   Title Pt to demonstrate 1/2 grade improvement in bilateral gluteal maximus musculature to allow pt to go up and down five steps without increased pain    Time 2   Period Weeks   Status On-going     PT SHORT TERM GOAL #4   Title Pt singl leg stance on both legs to be improved to 10 seconds to reduce risk of falling    Time 2   Period Weeks   Status Partially Met           PT Long Term Goals - 07/02/16 1153      PT LONG TERM GOAL #1   Title Pt Rt hamstring length to be improved by 10 degrees for decreased stress on her right knee.   Time 4   Period Weeks   Status On-going     PT LONG TERM GOAL #2   Title Pt to state that her pain in her knees has decreased to a maximum of 2/10 with functional activity    Time 4    Period Weeks   Status Partially Met     PT LONG TERM GOAL #3   Title Pt to state that she is walking 30 minutes three times a week    Time 4   Period Weeks   Status On-going     PT LONG TERM GOAL #4   Title Pt  bilateral gluteal strength to be increased by one grade to allow pt to go up and down 10 steps without increased knee pain    Time 10   Period Days   Status On-going     PT LONG TERM GOAL #5   Title Pt single leg stance bilaterally to be 15 seconds to allow pt to feel confident walking on uneven ground.    Time 4   Period Weeks   Status On-going               Plan - 07/06/16 1044    Clinical Impression Statement Pt tolerating session well today, able to perform entire session without any pain in knees. Pt demonstrates some mild cognititve deficits demonstrated in short term memory difficulty with alteranting sets of different exercises, unable to repeat an exercise without full instruciton again. The pt also show difficulty following multi steip instructions for more complex exercises. She continues to make improvemnts in strength of knees adn hips, but SLS activity shows continued weakness in the ankles.    Rehab Potential Good   PT Frequency 2x / week   PT Duration 4 weeks   PT Treatment/Interventions ADLs/Self Care Home Management;Cryotherapy;Air traffic controller;Therapeutic activities;Therapeutic exercise;Balance training;Neuromuscular re-education;Patient/family education;Manual techniques   PT Next Visit Plan Reassessment and possible DC.    PT Home Exercise Plan HEP issued at initial evaluation: quad sets, h/s st, scapular retraction, standing lumbar extension   Consulted and Agree with Plan of Care Patient      Patient will benefit from skilled therapeutic intervention in order to improve the following deficits and impairments:  Abnormal gait, Decreased activity tolerance, Decreased balance, Decreased strength,  Difficulty walking, Impaired flexibility, Postural dysfunction, Pain  Visit Diagnosis: Pain  in right knee  Pain in left knee  Unsteadiness on feet  Muscle weakness (generalized)     Problem List Patient Active Problem List   Diagnosis Date Noted  . Hypertension   . High cholesterol   . Anxiety    11:11 AM, 07/06/16 Etta Grandchild, PT, DPT Physical Therapist at [facility] [phone] (office)     [place] [address] [place], [place], [address] Phone: [phone]   Fax:  [phone]  Name: Linda [name] MRN: [id number] Date of Birth: 10/09/1941

## 2016-07-08 ENCOUNTER — Ambulatory Visit (HOSPITAL_COMMUNITY): Payer: Medicare Other | Admitting: Physical Therapy

## 2016-07-08 DIAGNOSIS — M25562 Pain in left knee: Secondary | ICD-10-CM | POA: Diagnosis not present

## 2016-07-08 DIAGNOSIS — R2681 Unsteadiness on feet: Secondary | ICD-10-CM

## 2016-07-08 DIAGNOSIS — M25561 Pain in right knee: Secondary | ICD-10-CM

## 2016-07-08 DIAGNOSIS — M6281 Muscle weakness (generalized): Secondary | ICD-10-CM

## 2016-07-08 NOTE — Therapy (Signed)
[place], [place], [address] Phone: [phone]   Fax:  [phone]  Physical Therapy Treatment  Patient Details  Name: Linda [name] DOBOSZ MRN: [id number] Date of Birth: 1941-02-21 Referring Provider: Dr. Lesly Rubenstein  Encounter Date: 07/08/2016      PT End of Session - 07/08/16 1109    Visit Number 8   Number of Visits 8   Date for PT Re-Evaluation 07/15/16   Authorization Type Medicare (Trad)    Authorization - Visit Number 8   Authorization - Number of Visits 8   PT Start Time 1030   PT Stop Time 1110   PT Time Calculation (min) 40 min   Activity Tolerance Patient tolerated treatment well   Behavior During Therapy [facility] for tasks assessed/performed      Past Medical History:  Diagnosis Date  . Anxiety   . Candida esophagitis   . Chronic kidney disease   . DDD (degenerative disc disease)   . Depression   . Erosive gastritis   . GERD (gastroesophageal reflux disease)   . Hiatal hernia   . High cholesterol   . History of anal fissures   . Hypertension   . Obesity   . Osteoarthritis   . Panic disorder   . Sleep apnea    Does not need CPAP anymore    Past Surgical History:  Procedure Laterality Date  . ABDOMINAL HYSTERECTOMY  1987   LSO / menorrhagia & leiomyomata  . APPENDECTOMY    . BREAST SURGERY  1989   benign tumor left breast  . CHOLECYSTECTOMY  2001  . TUBAL LIGATION      There were no vitals filed for this visit.      Subjective Assessment - 07/08/16 1115    Subjective Pt states she has not had any pain in well over a week.  Reports compliance with HEP and walking program and feels she is ready for discharge.    Currently in Pain? No/denies            [facility] PT Assessment - 07/08/16 1033      Assessment   Medical Diagnosis Bilateral knee pain    Onset Date/Surgical Date --  04/15/2016   Next MD Visit 07/03/2016   Prior Therapy none     Precautions   Precautions None     Restrictions   Weight Bearing Restrictions No     Home Environment   Living Environment Private residence   Home Access --   Entrance Stairs-Number of Steps --   Entrance Stairs-Rails --     Prior Function   Level of Independence --   Leisure --     Cognition   Overall Cognitive Status Within Functional Limits for tasks assessed     Observation/Other Assessments   Focus on Therapeutic Outcomes (FOTO)  64%  was 47%     Functional Tests   Functional tests Single leg stance;Sit to Stand     Single Leg Stance   Comments RT 16 seconds; LT 26 seconds   was 6" Rt, 4" Lt      Sit to Stand   Comments 5 x in 9:68   was 10:27     Posture/Postural Control   Posture/Postural Control Postural limitations   Postural Limitations Rounded Shoulders;Decreased lumbar lordosis;Increased thoracic kyphosis;Flexed trunk     AROM   Right Knee Extension 3  was lacking 7 degrees from neutral   Right Knee Flexion 130   Left  Knee Extension 3  was 6 degrees from neutral   Left Knee Flexion 130     Strength   Right Hip Flexion 5/5  was 5/5   Right Hip Extension 4/5  was 3/5   Right Hip ABduction 5/5  was 4+/5   Left Hip Flexion 5/5  was 5/5   Left Hip Extension 4-/5  was 3/5   Left Hip ABduction 4+/5  was 4/5   Right Knee Flexion 5/5  was 5/5   Right Knee Extension 5/5  was 5/5   Left Knee Flexion 5/5  was 5/5   Left Knee Extension 5/5  was 5/5   Right Ankle Dorsiflexion 5/5  was 5/5   Right Ankle Plantar Flexion 4/5   Left Ankle Dorsiflexion 5/5  was 5/5   Left Ankle Plantar Flexion 4+/5     Flexibility   Soft Tissue Assessment /Muscle Length --   Hamstrings --   Piriformis --                             PT Education - 07/08/16 1112    Education provided Yes   Education Details updated HEP to include STS, SLS and lunges.  Instructed to call or come by if any questions or concerns.   Person(s) Educated Patient   Methods  Explanation;Handout   Comprehension Verbalized understanding          PT Short Term Goals - 07/08/16 1050      PT SHORT TERM GOAL #1   Title Pt to verbalize the importance of proper posture in knee pain.    Time 1   Period Weeks   Status Achieved     PT SHORT TERM GOAL #2   Title Pt to demonstrate a five degree improvement in her right  hamstring length to decrease stress on the knee joint    Time 2   Period Weeks   Status Achieved     PT SHORT TERM GOAL #3   Title Pt to demonstrate 1/2 grade improvement in bilateral gluteal maximus musculature to allow pt to go up and down five steps without increased pain    Time 2   Period Weeks   Status Achieved     PT SHORT TERM GOAL #4   Title Pt singl leg stance on both legs to be improved to 10 seconds to reduce risk of falling    Time 2   Period Weeks   Status Achieved           PT Long Term Goals - 07/08/16 1051      PT LONG TERM GOAL #1   Title Pt Rt hamstring length to be improved by 10 degrees for decreased stress on her right knee.   Time 4   Period Weeks   Status Achieved     PT LONG TERM GOAL #2   Title Pt to state that her pain in her knees has decreased to a maximum of 2/10 with functional activity    Time 4   Period Weeks   Status Achieved     PT LONG TERM GOAL #3   Title Pt to state that she is walking 30 minutes three times a week    Time 4   Period Weeks   Status On-going     PT LONG TERM GOAL #4   Title Pt  bilateral gluteal strength to be increased by one grade to allow pt to go  up and down 10 steps without increased knee pain    Time 10   Period Days   Status Achieved     PT LONG TERM GOAL #5   Title Pt single leg stance bilaterally to be 15 seconds to allow pt to feel confident walking on uneven ground.    Time 4   Period Weeks   Status Achieved               Plan - 07/08/16 1109    Clinical Impression Statement Re-assessment completed with following findings.  Strength and ROM  is now WNL, has not had any pain in over a week and has met all short and long term goals.  Pt is progressing herself in a walking program 3X week and is now up to 15 minute bouts.  Pt without concerns or questions and is ready for discharge.  Given 3 additional exercises for HEP.    Rehab Potential Good   PT Frequency 2x / week   PT Duration 4 weeks   PT Treatment/Interventions ADLs/Self Care Home Management;Cryotherapy;Air traffic controller;Therapeutic activities;Therapeutic exercise;Balance training;Neuromuscular re-education;Patient/family education;Manual techniques   PT Next Visit Plan All goals met and patient ready for discharge.    PT Home Exercise Plan 9/20 added sit to stand, single leg balance and forward lunges   Consulted and Agree with Plan of Care Patient      Patient will benefit from skilled therapeutic intervention in order to improve the following deficits and impairments:  Abnormal gait, Decreased activity tolerance, Decreased balance, Decreased strength, Difficulty walking, Impaired flexibility, Postural dysfunction, Pain  Visit Diagnosis: Pain in right knee  Pain in left knee  Unsteadiness on feet  Muscle weakness (generalized)  [id number]  CJ [id number]  CI   Problem List Patient Active Problem List   Diagnosis Date Noted  . Hypertension   . High cholesterol   . Anxiety     Teena Irani, PTA/CLT [place], PT CLT [phone] 07/08/2016, 11:17 AM  [place], [place], [address] Phone: [phone]   Fax:  [phone]  Name: MARCELIA PETERSEN MRN: [id number] Date of Birth: October 12, 1941    Discharge  PHYSICAL THERAPY DISCHARGE SUMMARY  Visits from Start of Care: 8  Current functional level related to goals / functional outcomes: See above    Remaining deficits: Strength    Education / Equipment: HEP Plan: Patient agrees to  discharge.  Patient goals were met. Patient is being discharged due to meeting the stated rehab goals.  ?????        Rayetta Humphrey, [place] CLT [phone]

## 2016-07-08 NOTE — Patient Instructions (Signed)
Functional Quadriceps: Sit to Stand    Sit on edge of chair, feet flat on floor. Stand upright, extending knees fully.Repeat _10___ times per set.  Do _2_ sessions per day.   Single Leg - Eyes Open    Holding support, lift right leg while maintaining balance over other leg. Progress to removing hands from support surface for longer periods of time. Hold__30__ seconds. Repeat __5__ times per session. Do __2__ sessions per day.    Forward Lunge    Standing with feet shoulder width apart and stomach tight, step forward with left leg. Repeat _10___ times per set. Do __2__ sets per session. Do ___2_ sessions per day.

## 2016-07-13 ENCOUNTER — Encounter (HOSPITAL_COMMUNITY): Payer: Medicare Other

## 2016-07-14 DIAGNOSIS — M545 Low back pain: Secondary | ICD-10-CM | POA: Diagnosis not present

## 2016-07-14 DIAGNOSIS — M25561 Pain in right knee: Secondary | ICD-10-CM | POA: Diagnosis not present

## 2016-07-14 DIAGNOSIS — M25562 Pain in left knee: Secondary | ICD-10-CM | POA: Diagnosis not present

## 2016-07-15 ENCOUNTER — Encounter (HOSPITAL_COMMUNITY): Payer: Medicare Other | Admitting: Physical Therapy

## 2016-07-20 ENCOUNTER — Encounter (HOSPITAL_COMMUNITY): Payer: Medicare Other | Admitting: Physical Therapy

## 2016-07-22 ENCOUNTER — Encounter (HOSPITAL_COMMUNITY): Payer: Medicare Other | Admitting: Physical Therapy

## 2016-08-01 DIAGNOSIS — Z23 Encounter for immunization: Secondary | ICD-10-CM | POA: Diagnosis not present

## 2016-08-20 DIAGNOSIS — L570 Actinic keratosis: Secondary | ICD-10-CM | POA: Diagnosis not present

## 2016-08-20 DIAGNOSIS — B078 Other viral warts: Secondary | ICD-10-CM | POA: Diagnosis not present

## 2016-08-20 DIAGNOSIS — X32XXXD Exposure to sunlight, subsequent encounter: Secondary | ICD-10-CM | POA: Diagnosis not present

## 2016-09-23 ENCOUNTER — Ambulatory Visit (INDEPENDENT_AMBULATORY_CARE_PROVIDER_SITE_OTHER): Payer: Medicare Other | Admitting: Physician Assistant

## 2016-09-23 ENCOUNTER — Encounter: Payer: Self-pay | Admitting: Physician Assistant

## 2016-09-23 VITALS — BP 120/70 | HR 60 | Ht 61.0 in | Wt 170.6 lb

## 2016-09-23 DIAGNOSIS — K219 Gastro-esophageal reflux disease without esophagitis: Secondary | ICD-10-CM

## 2016-09-23 DIAGNOSIS — R1013 Epigastric pain: Secondary | ICD-10-CM

## 2016-09-23 DIAGNOSIS — R110 Nausea: Secondary | ICD-10-CM

## 2016-09-23 MED ORDER — RANITIDINE HCL 150 MG PO TABS: 150.0000 mg | ORAL_TABLET | Freq: Every day | ORAL | 11 refills | Status: DC

## 2016-09-23 MED ORDER — DEXLANSOPRAZOLE 60 MG PO CPDR: 60.0000 mg | DELAYED_RELEASE_CAPSULE | Freq: Every day | ORAL | 11 refills | Status: DC

## 2016-09-23 MED ORDER — SUCRALFATE 1 G PO TABS: ORAL_TABLET | ORAL | 1 refills | Status: DC

## 2016-09-23 NOTE — Progress Notes (Signed)
Subjective:    Patient ID: Linda [name], female    DOB: [birth year], 75 y.o.   MRN: [id number]  HPI Linda [name] is a pleasant 75 year old white female known to Dr. Fuller [name]. He was last seen in the office in 2015 and had a colonoscopy done in August 2016 or surveillance. She was found to have a 6 mm sessile polyp in the ascending colon which was removed in path consistent with a tubular adenoma. She is advised to have 5 year interval follow-up. His chronic GERD and had undergone an EGD in September 2013 with finding of candidiasis in the lower third of the esophagus and erosive gastritis. Biopsies showed reactive gastropathy no H. pylori. She has been requiring Dexilant  60 mg by mouth every morning and ranitidine 150 mg at bedtime. She comes in today with complaints of intermittent nausea and some burning upper abdominal pain which she's had intermittently over the past couple of weeks. Says the burning is not on a regular basis. Nausea is not new for he,r and has had problems off and on for years area she says she never vomits just feels queasy. She has a prescription for Zofran which she uses periodically and finds this helpful. Current complaints of dysphagia or odynophagia. She does mention that she frequently has a lot of discomfort and gas postprandially. She denies any regular aspirin or NSAID use she had been taking meloxicam at one point  earlier in the fall -couldn't tolerate long-term due to nausea.  Review of Systems Pertinent positive and negative review of systems were noted in the above HPI section.  All other review of systems was otherwise negative.  Outpatient Encounter Prescriptions as of 09/23/2016  Medication Sig  . ALPRAZolam (XANAX) 1 MG tablet Take 1 mg by mouth 3 (three) times daily as needed.    Marland Kitchen amLODipine (NORVASC) 5 MG tablet Take 10 mg by mouth daily.   Marland Kitchen aspirin 81 MG tablet Take 81 mg by mouth daily.    . benazepril (LOTENSIN) 40 MG tablet Take 40 mg by mouth daily.     Marland Kitchen BYSTOLIC 10 MG tablet Take 10 mg by mouth daily. Pt takes 1/2 tablet every day  . Calcium Carbonate-Vitamin D (CALTRATE 600+D) 600-400 MG-UNIT per tablet Take 1 tablet by mouth daily.  Marland Kitchen dexlansoprazole (DEXILANT) 60 MG capsule Take 1 capsule (60 mg total) by mouth daily.  . fish oil-omega-3 fatty acids 1000 MG capsule Take 1 g by mouth daily.   . furosemide (LASIX) 20 MG tablet Take 20 mg by mouth daily. Pt was told by MD to only take "once in awhile, not often"  . lamoTRIgine (LAMICTAL) 200 MG tablet Take 200 mg by mouth 2 (two) times daily.   . ondansetron (ZOFRAN) 8 MG tablet Take 8 mg by mouth every 8 (eight) hours as needed for nausea.  . potassium chloride SA (K-DUR,KLOR-CON) 20 MEQ tablet Take 20 mEq by mouth daily. Pt was told by MD to just take "once in awhile, not often"  . PRESCRIPTION MEDICATION fetzema 80mg  po once daily  . PRISTIQ 100 MG 24 hr tablet Take 1 tablet by mouth daily.  . ranitidine (ZANTAC) 150 MG tablet TAKE ONE (1) TABLET AT BEDTIME  . risperiDONE (RISPERDAL) 1 MG tablet Take 1 tablet by mouth daily.  . simvastatin (ZOCOR) 40 MG tablet Take 20 mg by mouth at bedtime.   . [DISCONTINUED] DEXILANT 60 MG capsule TAKE ONE (1) CAPSULE BY MOUTH EACH DAY  . ranitidine (ZANTAC) 150 MG  tablet Take 1 tablet (150 mg total) by mouth at bedtime.  . sucralfate (CARAFATE) 1 g tablet Take 1 tab between meals and at bedtime. Crush the tablet in a spoon and add water to make a slurry to swallow.  . [DISCONTINUED] FETZIMA 80 MG CP24 Take by mouth daily.   . [DISCONTINUED] ranitidine (ZANTAC) 150 MG tablet TAKE ONE TABLET DAILY AT BEDTIME   No facility-administered encounter medications on file as of 09/23/2016.    Allergies  Allergen Reactions  . Abilify [Aripiprazole] Other (See Comments)    Makes pt tremble  . Amoxicillin Hives   Patient Active Problem List   Diagnosis Date Noted  . Hypertension   . High cholesterol   . Anxiety    Social History   Social History  .  Marital status: Married    Spouse name: N/A  . Number of children: 1  . Years of education: N/A   Occupational History  . Retired     Social History Main Topics  . Smoking status: Never Smoker  . Smokeless tobacco: Never Used  . Alcohol use No  . Drug use: No  . Sexual activity: Yes   Other Topics Concern  . Not on file   Social History Narrative   Daily caffeine     Linda [name]'s family history includes Diabetes in her brother and sister; Heart disease in her brother, brother, brother, and mother; Hypertension in her sister; Lung cancer in her father; Ovarian cancer in her sister.      Objective:    Vitals:   09/23/16 0957  BP: 120/70  Pulse: 60    Physical Exam  well developed older white female in no acute distress, pleasant blood pressure 120/70 pulse 60, BMI 32.2. HEENT; nontraumatic normocephalic EOMI PERRLA sclera anicteric, Cardiovascular ;regular rate and rhythm with S1-S2 no murmur or gallop, Pulmonary ;clear bilaterally, Abdomen ;soft nontender no palpable mass or hepatosplenomegaly bowel sounds are present, Rectal; exam not done, 70s no clubbing cyanosis or edema skin warm and dry, Neuropsych; mood and affect appropriate       Assessment & [name]:   #72 75 year old white female with chronic intermittent mild nausea, responsive to Zofran #2 chronic GERD-requiring high-dose therapy with Dexilant  60 mg by mouth daily and Zantac 150 daily at bedtime. #3 history of erosive gastritis-current complaints of intermittent nausea and epigastric burning rule out gastritis, question bile induced  Gastropathy Rule out functional dyspepsia #4 history of adenomatous colon polyps last colonoscopy August 2016 due for follow-up 2021 #5 anxiety  [name]; continue Dexilant  60 mg by mouth every morning Continue ranitidine 150 mg by mouth daily at bedtime both were refilled Start trial of Carafate 1 g by mouth between meals and at bedtime 3 weeks  Also suggested a trial of FD  gard, 2 by mouth twice a day as needed for dyspeptic symptoms. She will follow up on an as-needed basis and is advised to call if the above measures are not helpful.   Ladoris Lythgoe S Katherinne Mofield PA-C 09/23/2016   Cc: Crist Infante, MD

## 2016-09-23 NOTE — Patient Instructions (Signed)
We sent prescriptions to [facility]. 1.Carafate tablets. Take 1 tab between meals and at bedtime. Crush 1 tab in a spoon, add water to make a slurry to swallow.  2. Dexiant 60 mg 3. Ranitidine 150 mg.  We gave you samples of FD Gard.  Take 2 capsules 2 times daily for dyspepsia, nausea.

## 2016-09-23 NOTE — Progress Notes (Signed)
Reviewed and agree with initial management plan.  Malcolm T. Stark, MD FACG 

## 2016-11-26 DIAGNOSIS — H6093 Unspecified otitis externa, bilateral: Secondary | ICD-10-CM | POA: Diagnosis not present

## 2016-11-26 DIAGNOSIS — J019 Acute sinusitis, unspecified: Secondary | ICD-10-CM | POA: Diagnosis not present

## 2016-11-26 DIAGNOSIS — Z6832 Body mass index (BMI) 32.0-32.9, adult: Secondary | ICD-10-CM | POA: Diagnosis not present

## 2016-12-15 ENCOUNTER — Telehealth: Payer: Self-pay | Admitting: Gastroenterology

## 2016-12-15 NOTE — Telephone Encounter (Signed)
Patient notified that she should be taking two FDgard twice a day, she has only been taking one twice a day.  She will try this and if she is still having nausea she will call back

## 2016-12-30 ENCOUNTER — Telehealth: Payer: Self-pay

## 2016-12-30 NOTE — Telephone Encounter (Signed)
Patient reports that she is having abdominal burning and pain.  She has not been taking her dexilant.  She will resume this as well as a antireflux diet. She is advised to make sure she is taking it 30 minutes prior to her first meal of the day.  She will call back for an appt if this does not help after a few weeks.

## 2017-01-07 ENCOUNTER — Ambulatory Visit (INDEPENDENT_AMBULATORY_CARE_PROVIDER_SITE_OTHER): Payer: Medicare Other | Admitting: Physician Assistant

## 2017-01-07 ENCOUNTER — Encounter: Payer: Self-pay | Admitting: Physician Assistant

## 2017-01-07 VITALS — BP 130/62 | HR 60 | Ht 61.0 in | Wt 165.0 lb

## 2017-01-07 DIAGNOSIS — K2950 Unspecified chronic gastritis without bleeding: Secondary | ICD-10-CM

## 2017-01-07 DIAGNOSIS — R110 Nausea: Secondary | ICD-10-CM

## 2017-01-07 DIAGNOSIS — K219 Gastro-esophageal reflux disease without esophagitis: Secondary | ICD-10-CM | POA: Diagnosis not present

## 2017-01-07 MED ORDER — HYOSCYAMINE SULFATE 0.125 MG SL SUBL: SUBLINGUAL_TABLET | SUBLINGUAL | 6 refills | Status: DC

## 2017-01-07 MED ORDER — DEXLANSOPRAZOLE 60 MG PO CPDR: 60.0000 mg | DELAYED_RELEASE_CAPSULE | Freq: Every day | ORAL | 6 refills | Status: DC

## 2017-01-07 MED ORDER — RANITIDINE HCL 150 MG PO TABS: ORAL_TABLET | ORAL | 6 refills | Status: DC

## 2017-01-07 MED ORDER — SUCRALFATE 1 G PO TABS: ORAL_TABLET | ORAL | 4 refills | Status: DC

## 2017-01-07 MED ORDER — ONDANSETRON HCL 8 MG PO TABS: 8.0000 mg | ORAL_TABLET | Freq: Three times a day (TID) | ORAL | 6 refills | Status: AC | PRN

## 2017-01-07 NOTE — Patient Instructions (Signed)
We have sent the following medications to your pharmacy for you to pick up at your convenience: Carafate, Zofran, Zantac, Levsin, Dexilant  Please follow up with Amy or Dr. Fuller Plan in 3-4 months

## 2017-01-07 NOTE — Progress Notes (Addendum)
Subjective:    Patient ID: Linda [name], female    DOB: [birth year], 76 y.o.   MRN: [id number]  HPI Linda [name] is a pleasant 76 year old white female known to Dr. Fuller Plan. She was last seen in our office in December 2017 with complaints of chronic GERD requiring high-dose PPI therapy and chronic nausea. Patient has history of adenomatous colon polyps, due for follow-up in 2021 and also has history of erosive gastritis. She is to status post cholecystectomy. He had been taking Dexalone 60 mg by mouth every morning, Zantac at bedtime, and was prescribed Carafate in December 2017. She says she was doing better after that but did not understand that she was supposed to continue take on all of those medications so she stopped them. She says she began having a lot of problems again with heartburn indigestion and epigastric discomfort. She also has fairly chronic nausea that she says never goes completely away and is present most days at some point in the day. This does not prevent her from eating, and she does not vomit. She started back on her Dexilant  and ranitidine about 3 weeks ago and is feeling somewhat better though still having some persistent symptoms. She is worried because she has a trip planned in May and doesn't want to feel bad while she's traveling. She does take a baby aspirin daily, no NSAIDs. Has prescription for Zofran which she uses as needed. She does admit that she has "bad problems" with anxiety and has been under increased stress recently with family issues and that that tends to aggravate her nausea.  Review of Systems Pertinent positive and negative review of systems were noted in the above HPI section.  All other review of systems was otherwise negative.  Outpatient Encounter Prescriptions as of 01/07/2017  Medication Sig  . ALPRAZolam (XANAX) 1 MG tablet Take 1 mg by mouth 3 (three) times daily as needed.    Marland Kitchen amLODipine (NORVASC) 5 MG tablet Take 10 mg by mouth daily.   Marland Kitchen  aspirin 81 MG tablet Take 81 mg by mouth daily.    . benazepril (LOTENSIN) 40 MG tablet Take 40 mg by mouth daily.    Marland Kitchen BYSTOLIC 10 MG tablet Take 10 mg by mouth daily. Pt takes 1/2 tablet every day  . Calcium Carbonate-Vitamin D (CALTRATE 600+D) 600-400 MG-UNIT per tablet Take 1 tablet by mouth daily.  Marland Kitchen dexlansoprazole (DEXILANT) 60 MG capsule Take 1 capsule (60 mg total) by mouth daily.  . fish oil-omega-3 fatty acids 1000 MG capsule Take 1 g by mouth daily.   Marland Kitchen FLUoxetine (PROZAC) 40 MG capsule Take 40 mg by mouth daily.  . furosemide (LASIX) 20 MG tablet Take 20 mg by mouth daily. Pt was told by MD to only take "once in awhile, not often"  . lamoTRIgine (LAMICTAL) 200 MG tablet Take 200 mg by mouth 2 (two) times daily.   . ondansetron (ZOFRAN) 8 MG tablet Take 1 tablet (8 mg total) by mouth every 8 (eight) hours as needed for nausea.  . potassium chloride SA (K-DUR,KLOR-CON) 20 MEQ tablet Take 20 mEq by mouth daily. Pt was told by MD to just take "once in awhile, not often"  . PRESCRIPTION MEDICATION fetzema 80mg  po once daily  . ranitidine (ZANTAC) 150 MG tablet TAKE ONE (1) TABLET AT BEDTIME  . risperiDONE (RISPERDAL) 1 MG tablet Take 1 tablet by mouth daily.  . simvastatin (ZOCOR) 40 MG tablet Take 20 mg by mouth at bedtime.   Marland Kitchen  sucralfate (CARAFATE) 1 g tablet Take 1 tab between meals and at bedtime. Crush the tablet in a spoon and add water to make a slurry to swallow.  . [DISCONTINUED] dexlansoprazole (DEXILANT) 60 MG capsule Take 1 capsule (60 mg total) by mouth daily.  . [DISCONTINUED] ondansetron (ZOFRAN) 8 MG tablet Take 8 mg by mouth every 8 (eight) hours as needed for nausea.  . [DISCONTINUED] PRISTIQ 100 MG 24 hr tablet Take 1 tablet by mouth daily.  . [DISCONTINUED] ranitidine (ZANTAC) 150 MG tablet TAKE ONE (1) TABLET AT BEDTIME  . hyoscyamine (LEVSIN SL) 0.125 MG SL tablet Take one tablet sublingually 15-20 minutes before meals  . sucralfate (CARAFATE) 1 g tablet Take one  tablet three times a day between meals  . [DISCONTINUED] ranitidine (ZANTAC) 150 MG tablet Take 1 tablet (150 mg total) by mouth at bedtime.   No facility-administered encounter medications on file as of 01/07/2017.    Allergies  Allergen Reactions  . Abilify [Aripiprazole] Other (See Comments)    Makes pt tremble  . Amoxicillin Hives   Patient Active Problem List   Diagnosis Date Noted  . Hypertension   . High cholesterol   . Anxiety    Social History   Social History  . Marital status: Married    Spouse name: N/A  . Number of children: 1  . Years of education: N/A   Occupational History  . Retired     Social History Main Topics  . Smoking status: Never Smoker  . Smokeless tobacco: Never Used  . Alcohol use No  . Drug use: No  . Sexual activity: Yes   Other Topics Concern  . Not on file   Social History Narrative   Daily caffeine     Linda [name]'s family history includes Diabetes in her brother and sister; Heart disease in her brother, brother, brother, and mother; Hypertension in her sister; Lung cancer in her father; Ovarian cancer in her sister.      Objective:    Vitals:   01/07/17 0936  BP: 130/62  Pulse: 60    Physical Exam  well-developed elderly white female in no acute distress, pleasant, blood pressure 130/62, pulse 60, P HEENT; nontraumatic normocephalic EOMI PERRLA sclera anicteric, Cardiovascular; regular rate and rhythm with S1-S2 no murmur rub or gallop, Pulmonary; clear bilaterally, Abdomen ;soft, nontender nondistended bowel sounds are active there is no palpable mass or hepatosplenomegaly bowel sounds are present, Rectal ;exam not done, Extremities; no clubbing cyanosis or edema skin warm dry, Neuropsych; mood and affect appropriate -she's anxious.       Assessment & Plan:   #27 76 year old white female with chronic GERD who has required higher dose medication, with exacerbation of symptoms off meds. #2 history of erosive gastropathy #3  chronic nausea-question bile induced gastropathy #4 chronic anxiety contributing to above #5 says postcholecystectomy #6 history of adenomatous colon polyps due for follow-up 2021  Plan; continue DEXA want 60 mg by mouth every morning Continue ranitidine 150 mg by mouth daily at bedtime Encouraged patient to use her anti-anxiety meds regularly to help control chronic nausea-consider switching to BuSpar which has been used for chronic functional dyspepsia/ nausea  Add trial of Levsin sublingual 15-20 minutes before meals Carafate 1 g by mouth 3 times daily between meals 8 weeks. We discussed possible repeat EGD if symptoms or prior's persisting despite above regimen. She will call back for follow-up with Dr. Fuller Plan or myself in one month if not improved.   Nic Lampe S  Copan PA-C 01/07/2017   Cc: Crist Infante, MD  Agree with Ms. Genia Harold assessment and plan. Gatha Mayer, MD, [facility]

## 2017-01-08 ENCOUNTER — Telehealth: Payer: Self-pay

## 2017-01-08 NOTE — Telephone Encounter (Signed)
After refilled patient's Zofran, pharmacy communicted that she usually gets ODT.  Gave them permission to change to ODT.

## 2017-01-08 NOTE — Telephone Encounter (Signed)
Sent prior authorization for Levsin to Cover My Meds.

## 2017-01-14 DIAGNOSIS — D225 Melanocytic nevi of trunk: Secondary | ICD-10-CM | POA: Diagnosis not present

## 2017-01-14 DIAGNOSIS — L821 Other seborrheic keratosis: Secondary | ICD-10-CM | POA: Diagnosis not present

## 2017-01-19 ENCOUNTER — Telehealth: Payer: Self-pay | Admitting: Physician Assistant

## 2017-01-19 NOTE — Telephone Encounter (Signed)
Dates discussed and confirmed with the patient.

## 2017-01-19 NOTE — Telephone Encounter (Signed)
This patient was seen by Nicoletta Ba, PA-C on 01/07/17. She calls today wanting to go forward with an EGD. She does have persistent symptoms. She says she feels nauseous a lot and this is interfering with her daily activities. Okay to schedule this patient? Amy is out of the office this week. Thank you.

## 2017-01-19 NOTE — Telephone Encounter (Signed)
Yes schedule EGD in [place] with me please

## 2017-01-20 NOTE — Telephone Encounter (Signed)
zofran refilled.

## 2017-01-29 DIAGNOSIS — I10 Essential (primary) hypertension: Secondary | ICD-10-CM | POA: Diagnosis not present

## 2017-01-29 DIAGNOSIS — R000 Tachycardia, unspecified: Secondary | ICD-10-CM | POA: Diagnosis not present

## 2017-01-29 DIAGNOSIS — F339 Major depressive disorder, recurrent, unspecified: Secondary | ICD-10-CM | POA: Diagnosis not present

## 2017-01-29 DIAGNOSIS — I4510 Unspecified right bundle-branch block: Secondary | ICD-10-CM | POA: Diagnosis not present

## 2017-01-29 DIAGNOSIS — R002 Palpitations: Secondary | ICD-10-CM | POA: Diagnosis not present

## 2017-01-29 DIAGNOSIS — Z6831 Body mass index (BMI) 31.0-31.9, adult: Secondary | ICD-10-CM | POA: Diagnosis not present

## 2017-01-29 DIAGNOSIS — R0789 Other chest pain: Secondary | ICD-10-CM | POA: Diagnosis not present

## 2017-02-15 ENCOUNTER — Ambulatory Visit (AMBULATORY_SURGERY_CENTER): Payer: Self-pay | Admitting: *Deleted

## 2017-02-15 VITALS — Ht 61.0 in | Wt 166.0 lb

## 2017-02-15 DIAGNOSIS — K2950 Unspecified chronic gastritis without bleeding: Secondary | ICD-10-CM

## 2017-02-15 DIAGNOSIS — R110 Nausea: Secondary | ICD-10-CM

## 2017-02-15 DIAGNOSIS — K219 Gastro-esophageal reflux disease without esophagitis: Secondary | ICD-10-CM

## 2017-02-15 NOTE — Progress Notes (Signed)
Patient denies any allergies to eggs or soy. Patient denies any problems with anesthesia/sedation. Patient denies any oxygen use at home and does not take any diet/weight loss medications. EMMI education assisgned to patient on Upper GI endoscopy, this was explained and instructions given to patient.

## 2017-02-23 ENCOUNTER — Encounter: Payer: Medicare Other | Admitting: Gastroenterology

## 2017-03-16 ENCOUNTER — Telehealth: Payer: Self-pay | Admitting: Gastroenterology

## 2017-03-16 NOTE — Telephone Encounter (Signed)
Returned patient's call and reassured her that having crowns will not prevent the endoscopy being done.  I advised her that they will speak with her regarding dental work prior to having procedure done and that they use a bite block which will help prevent any damage.

## 2017-03-19 ENCOUNTER — Encounter: Payer: Self-pay | Admitting: Gastroenterology

## 2017-03-19 ENCOUNTER — Ambulatory Visit (AMBULATORY_SURGERY_CENTER): Payer: Medicare Other | Admitting: Gastroenterology

## 2017-03-19 VITALS — BP 128/62 | HR 52 | Temp 97.5°F | Resp 15 | Ht 61.0 in | Wt 166.0 lb

## 2017-03-19 DIAGNOSIS — K219 Gastro-esophageal reflux disease without esophagitis: Secondary | ICD-10-CM | POA: Diagnosis not present

## 2017-03-19 DIAGNOSIS — R1013 Epigastric pain: Secondary | ICD-10-CM

## 2017-03-19 MED ORDER — SODIUM CHLORIDE 0.9 % IV SOLN: 500.0000 mL | INTRAVENOUS | Status: DC

## 2017-03-19 NOTE — Progress Notes (Signed)
A and O x3. Report to RN. Tolerated MAC anesthesia well.Teeth unchanged after procedure.

## 2017-03-19 NOTE — Patient Instructions (Signed)
Impression/Recommendations:  Hiatal hernia handout given to patient.  Resume previous diet. Continue present medication.  Follow up with GI office in one year.  YOU HAD AN ENDOSCOPIC PROCEDURE TODAY AT [facility]:   Refer to the procedure report that was given to you for any specific questions about what was found during the examination.  If the procedure report does not answer your questions, please call your gastroenterologist to clarify.  If you requested that your care partner not be given the details of your procedure findings, then the procedure report has been included in a sealed envelope for you to review at your convenience later.  YOU SHOULD EXPECT: Some feelings of bloating in the abdomen. Passage of more gas than usual.  Walking can help get rid of the air that was put into your GI tract during the procedure and reduce the bloating. If you had a lower endoscopy (such as a colonoscopy or flexible sigmoidoscopy) you may notice spotting of blood in your stool or on the toilet paper. If you underwent a bowel prep for your procedure, you may not have a normal bowel movement for a few days.  Please Note:  You might notice some irritation and congestion in your nose or some drainage.  This is from the oxygen used during your procedure.  There is no need for concern and it should clear up in a day or so.  SYMPTOMS TO REPORT IMMEDIATELY:   Following upper endoscopy (EGD)  Vomiting of blood or coffee ground material  New chest pain or pain under the shoulder blades  Painful or persistently difficult swallowing  New shortness of breath  Fever of 100F or higher  Black, tarry-looking stools  For urgent or emergent issues, a gastroenterologist can be reached at any hour by calling [phone].   DIET:  We do recommend a small meal at first, but then you may proceed to your regular diet.  Drink plenty of fluids but you should avoid alcoholic beverages for 24  hours.  ACTIVITY:  You should plan to take it easy for the rest of today and you should NOT DRIVE or use heavy machinery until tomorrow (because of the sedation medicines used during the test).    FOLLOW UP: Our staff will call the number listed on your records the next business day following your procedure to check on you and address any questions or concerns that you may have regarding the information given to you following your procedure. If we do not reach you, we will leave a message.  However, if you are feeling well and you are not experiencing any problems, there is no need to return our call.  We will assume that you have returned to your regular daily activities without incident.  If any biopsies were taken you will be contacted by phone or by letter within the next 1-3 weeks.  Please call us at [phone] if you have not heard about the biopsies in 3 weeks.    SIGNATURES/CONFIDENTIALITY: You and/or your care partner have signed paperwork which will be entered into your electronic medical record.  These signatures attest to the fact that that the information above on your After Visit Summary has been reviewed and is understood.  Full responsibility of the confidentiality of this discharge information lies with you and/or your care-partner.

## 2017-03-19 NOTE — Progress Notes (Signed)
Pt's states no medical or surgical changes since previsit or office visit. 

## 2017-03-19 NOTE — Op Note (Signed)
[facility] Patient Name: Linda [name] Procedure Date: 03/19/2017 10:03 AM MRN: [id number] Endoscopist: Ladene Artist , MD Age: 76 Referring MD:  Date of Birth: 02-28-1941 Gender: Female Account #: [id number] Procedure:                Upper GI endoscopy Indications:              Epigastric abdominal pain, Gastro-esophageal reflux                            disease Medicines:                Monitored Anesthesia Care Procedure:                Pre-Anesthesia Assessment:                           - Prior to the procedure, a History and Physical                            was performed, and patient medications and                            allergies were reviewed. The patient's tolerance of                            previous anesthesia was also reviewed. The risks                            and benefits of the procedure and the sedation                            options and risks were discussed with the patient.                            All questions were answered, and informed consent                            was obtained. Prior Anticoagulants: The patient has                            taken no previous anticoagulant or antiplatelet                            agents. ASA Grade Assessment: II - A patient with                            mild systemic disease. After reviewing the risks                            and benefits, the patient was deemed in                            satisfactory condition to undergo the procedure.  After obtaining informed consent, the endoscope was                            passed under direct vision. Throughout the                            procedure, the patient's blood pressure, pulse, and                            oxygen saturations were monitored continuously. The                            Endoscope was introduced through the mouth, and                            advanced to the second part of duodenum.  The upper                            GI endoscopy was accomplished without difficulty.                            The patient tolerated the procedure well. Scope In: Scope Out: Findings:                 The examined esophagus was normal.                           A small hiatal hernia was present.                           The exam of the stomach was otherwise normal.                           The duodenal bulb and second portion of the                            duodenum were normal. Complications:            No immediate complications. Estimated Blood Loss:     Estimated blood loss: none. Impression:               - Normal esophagus.                           - Small hiatal hernia.                           - Normal duodenal bulb and second portion of the                            duodenum.                           - No specimens collected. Recommendation:           - Patient has a contact number available for  emergencies. The signs and symptoms of potential                            delayed complications were discussed with the                            patient. Return to normal activities tomorrow.                            Written discharge instructions were provided to the                            patient.                           - Resume previous diet.                           - Continue present medications.                           - GI follow up in 1 year. Ladene Artist, MD 03/19/2017 10:19:31 AM This report has been signed electronically.

## 2017-03-22 ENCOUNTER — Telehealth: Payer: Self-pay | Admitting: *Deleted

## 2017-03-22 NOTE — Telephone Encounter (Signed)
  Follow up Call-  Call back number 03/19/2017 05/21/2015  Post procedure Call Back phone  # [phone]  Permission to leave phone message Yes Yes  Some recent data might be hidden     Patient questions:  Do you have a fever, pain , or abdominal swelling? No. Pain Score  0 *  Have you tolerated food without any problems? Yes.    Have you been able to return to your normal activities? Yes.    Do you have any questions about your discharge instructions: Diet   No. Medications  No. Follow up visit  No.  Do you have questions or concerns about your Care? No.  Actions: * If pain score is 4 or above: No action needed, pain <4.

## 2017-04-13 DIAGNOSIS — H25813 Combined forms of age-related cataract, bilateral: Secondary | ICD-10-CM | POA: Diagnosis not present

## 2017-04-13 DIAGNOSIS — H3540 Unspecified peripheral retinal degeneration: Secondary | ICD-10-CM | POA: Diagnosis not present

## 2017-04-29 ENCOUNTER — Other Ambulatory Visit: Payer: Self-pay | Admitting: Internal Medicine

## 2017-04-29 DIAGNOSIS — Z1231 Encounter for screening mammogram for malignant neoplasm of breast: Secondary | ICD-10-CM

## 2017-05-20 ENCOUNTER — Other Ambulatory Visit: Payer: Self-pay | Admitting: Gastroenterology

## 2017-05-20 MED ORDER — DEXLANSOPRAZOLE 60 MG PO CPDR: 60.0000 mg | DELAYED_RELEASE_CAPSULE | Freq: Every day | ORAL | 3 refills | Status: DC

## 2017-05-20 NOTE — Telephone Encounter (Signed)
Received a fax from Pharmacy to refill of 90 day supply of Dexilant. Pat had recent procedure 03/2017. Medication sent in for Dexilant 60 mg 1 qd #90 with 3 refills.

## 2017-06-02 ENCOUNTER — Telehealth: Payer: Self-pay | Admitting: Gastroenterology

## 2017-06-02 NOTE — Telephone Encounter (Signed)
FDgard has helped but has been taking it after eating instead of before as recommended on the package.  She still has some nausea.  She was advised to follow the package instructions and continue her other meds as ordered.  She will keep a food diary and try to narrow down what foods are causing the nausea.  She will call back in 2 weeks to update.

## 2017-06-04 ENCOUNTER — Ambulatory Visit
Admission: RE | Admit: 2017-06-04 | Discharge: 2017-06-04 | Disposition: A | Discharge status: Home / Self Care | Payer: Medicare Other | Source: Ambulatory Visit | Attending: Internal Medicine | Admitting: Internal Medicine

## 2017-06-04 DIAGNOSIS — Z1231 Encounter for screening mammogram for malignant neoplasm of breast: Secondary | ICD-10-CM | POA: Diagnosis not present

## 2017-06-17 ENCOUNTER — Other Ambulatory Visit: Payer: Self-pay | Admitting: *Deleted

## 2017-06-17 MED ORDER — RANITIDINE HCL 150 MG PO TABS: ORAL_TABLET | ORAL | 3 refills | Status: DC

## 2017-06-22 DIAGNOSIS — I10 Essential (primary) hypertension: Secondary | ICD-10-CM | POA: Diagnosis not present

## 2017-06-22 DIAGNOSIS — N390 Urinary tract infection, site not specified: Secondary | ICD-10-CM | POA: Diagnosis not present

## 2017-06-22 DIAGNOSIS — R8299 Other abnormal findings in urine: Secondary | ICD-10-CM | POA: Diagnosis not present

## 2017-06-22 DIAGNOSIS — E784 Other hyperlipidemia: Secondary | ICD-10-CM | POA: Diagnosis not present

## 2017-06-28 DIAGNOSIS — R600 Localized edema: Secondary | ICD-10-CM | POA: Diagnosis not present

## 2017-06-28 DIAGNOSIS — R001 Bradycardia, unspecified: Secondary | ICD-10-CM | POA: Diagnosis not present

## 2017-06-28 DIAGNOSIS — F338 Other recurrent depressive disorders: Secondary | ICD-10-CM | POA: Diagnosis not present

## 2017-06-28 DIAGNOSIS — K5909 Other constipation: Secondary | ICD-10-CM | POA: Diagnosis not present

## 2017-06-28 DIAGNOSIS — Z1389 Encounter for screening for other disorder: Secondary | ICD-10-CM | POA: Diagnosis not present

## 2017-06-28 DIAGNOSIS — N182 Chronic kidney disease, stage 2 (mild): Secondary | ICD-10-CM | POA: Diagnosis not present

## 2017-06-28 DIAGNOSIS — R0789 Other chest pain: Secondary | ICD-10-CM | POA: Diagnosis not present

## 2017-06-28 DIAGNOSIS — Z0000 Encounter for general adult medical examination without abnormal findings: Secondary | ICD-10-CM | POA: Diagnosis not present

## 2017-06-28 DIAGNOSIS — R350 Frequency of micturition: Secondary | ICD-10-CM | POA: Diagnosis not present

## 2017-06-28 DIAGNOSIS — Z1231 Encounter for screening mammogram for malignant neoplasm of breast: Secondary | ICD-10-CM | POA: Diagnosis not present

## 2017-06-28 DIAGNOSIS — R002 Palpitations: Secondary | ICD-10-CM | POA: Diagnosis not present

## 2017-06-28 DIAGNOSIS — Z6830 Body mass index (BMI) 30.0-30.9, adult: Secondary | ICD-10-CM | POA: Diagnosis not present

## 2017-07-17 DIAGNOSIS — Z23 Encounter for immunization: Secondary | ICD-10-CM | POA: Diagnosis not present

## 2017-12-27 DIAGNOSIS — R0981 Nasal congestion: Secondary | ICD-10-CM | POA: Diagnosis not present

## 2017-12-27 DIAGNOSIS — K5909 Other constipation: Secondary | ICD-10-CM | POA: Diagnosis not present

## 2017-12-27 DIAGNOSIS — I10 Essential (primary) hypertension: Secondary | ICD-10-CM | POA: Diagnosis not present

## 2017-12-27 DIAGNOSIS — E7849 Other hyperlipidemia: Secondary | ICD-10-CM | POA: Diagnosis not present

## 2017-12-27 DIAGNOSIS — Z6833 Body mass index (BMI) 33.0-33.9, adult: Secondary | ICD-10-CM | POA: Diagnosis not present

## 2017-12-27 DIAGNOSIS — F338 Other recurrent depressive disorders: Secondary | ICD-10-CM | POA: Diagnosis not present

## 2018-01-20 DIAGNOSIS — Z1283 Encounter for screening for malignant neoplasm of skin: Secondary | ICD-10-CM | POA: Diagnosis not present

## 2018-01-20 DIAGNOSIS — L820 Inflamed seborrheic keratosis: Secondary | ICD-10-CM | POA: Diagnosis not present

## 2018-01-20 DIAGNOSIS — C44612 Basal cell carcinoma of skin of right upper limb, including shoulder: Secondary | ICD-10-CM | POA: Diagnosis not present

## 2018-01-20 DIAGNOSIS — D225 Melanocytic nevi of trunk: Secondary | ICD-10-CM | POA: Diagnosis not present

## 2018-01-20 DIAGNOSIS — C44619 Basal cell carcinoma of skin of left upper limb, including shoulder: Secondary | ICD-10-CM | POA: Diagnosis not present

## 2018-01-21 ENCOUNTER — Emergency Department (HOSPITAL_COMMUNITY)
Admission: EM | Admit: 2018-01-21 | Discharge: 2018-01-21 | Disposition: A | Discharge status: Home / Self Care | Payer: Medicare Other | Attending: Emergency Medicine | Admitting: Emergency Medicine

## 2018-01-21 ENCOUNTER — Encounter (HOSPITAL_COMMUNITY): Payer: Self-pay | Admitting: Emergency Medicine

## 2018-01-21 ENCOUNTER — Other Ambulatory Visit: Payer: Self-pay

## 2018-01-21 DIAGNOSIS — Z79899 Other long term (current) drug therapy: Secondary | ICD-10-CM | POA: Diagnosis not present

## 2018-01-21 DIAGNOSIS — R21 Rash and other nonspecific skin eruption: Secondary | ICD-10-CM | POA: Diagnosis not present

## 2018-01-21 DIAGNOSIS — I129 Hypertensive chronic kidney disease with stage 1 through stage 4 chronic kidney disease, or unspecified chronic kidney disease: Secondary | ICD-10-CM | POA: Insufficient documentation

## 2018-01-21 DIAGNOSIS — T7840XA Allergy, unspecified, initial encounter: Secondary | ICD-10-CM | POA: Insufficient documentation

## 2018-01-21 DIAGNOSIS — N189 Chronic kidney disease, unspecified: Secondary | ICD-10-CM | POA: Insufficient documentation

## 2018-01-21 DIAGNOSIS — Z7982 Long term (current) use of aspirin: Secondary | ICD-10-CM | POA: Insufficient documentation

## 2018-01-21 NOTE — Discharge Instructions (Addendum)
I suspect you had a reaction either to the type of Band-Aid used, or the Neosporin and the Polysporin antibiotics.  Please use hypoallergenic bandages.  Please cleanse the wound with soap and water and stop the Polysporin or Neosporin for now.  Please return to the emergency department immediately if any difficulty with your breathing, hives, changes in your condition, problems, or concerns.

## 2018-01-21 NOTE — ED Provider Notes (Signed)
[facility] EMERGENCY DEPARTMENT Provider Note   CSN: [id number] Arrival date & time: 01/21/18  1633     History   Chief Complaint Chief Complaint  Patient presents with  . Rash    HPI Linda [name] is a 77 y.o. female.  Patient is a 77 year old female who presents to the emergency department with a rash.  The patient states that on yesterday April 4 she had a growth removed from her right arm by dermatologist.  She states that later that evening she noticed some itching, and later during the night she noticed some itching and some hives.  The hives were restricted only to the site of the wound.  The patient states that she had some itching earlier this morning.  The hives got better as the day went on but she continued to have the itching and noticed some increased redness so she came to the emergency department for evaluation.  She attempted to reach the dermatologist, but the dermatologist was not in the office and the office staff advised the patient to come to the emergency department.  It is of note that the patient received Polysporin ointment to be placed on the wound area.  The patient states that the area was "numbed up", but the patient does not know what she was numbed up with, and she states she is not had any previous issues with numbing agents.  The history is provided by the patient.  Rash      Past Medical History:  Diagnosis Date  . Anxiety   . Candida esophagitis ([place])   . Chronic kidney disease   . DDD (degenerative disc disease)   . Depression   . Erosive gastritis   . GERD (gastroesophageal reflux disease)   . Hiatal hernia   . High cholesterol   . History of anal fissures   . Hypertension   . Obesity   . Osteoarthritis   . Panic disorder   . Sleep apnea    Does not need CPAP anymore    Patient Active Problem List   Diagnosis Date Noted  . Hypertension   . High cholesterol   . Anxiety     Past Surgical History:  Procedure Laterality  Date  . ABDOMINAL HYSTERECTOMY  1987   LSO / menorrhagia & leiomyomata  . APPENDECTOMY    . BREAST EXCISIONAL BIOPSY Left 1989   benign  . BREAST SURGERY  1989   benign tumor left breast  . CHOLECYSTECTOMY  2001  . TUBAL LIGATION       OB History    Gravida  3   Para  1   Term      Preterm      AB  2   Living  1     SAB  2   TAB      Ectopic      Multiple      Live Births               Home Medications    Prior to Admission medications   Medication Sig Start Date End Date Taking? Authorizing Provider  ALPRAZolam Duanne Moron) 1 MG tablet Take 1 mg by mouth 3 (three) times daily as needed.      [provider]  amLODipine (NORVASC) 5 MG tablet Take 10 mg by mouth daily.  07/01/12   [provider]  aspirin 81 MG tablet Take 81 mg by mouth daily.      [provider]  benazepril (LOTENSIN) 40 MG tablet Take 40 mg by mouth daily.      [provider]  BYSTOLIC 10 MG tablet Take 10 mg by mouth daily. Pt takes 1/2 tablet every day 04/21/15   [provider]  Calcium Carbonate-Vitamin D (CALTRATE 600+D) 600-400 MG-UNIT per tablet Take 1 tablet by mouth daily.    [provider]  dexlansoprazole (DEXILANT) 60 MG capsule Take 1 capsule (60 mg total) by mouth daily. 05/20/17   Ladene Artist, MD  fish oil-omega-3 fatty acids 1000 MG capsule Take 1 g by mouth daily.     [provider]  furosemide (LASIX) 20 MG tablet Take 20 mg by mouth daily. Pt was told by MD to only take "once in awhile, not often"    [provider]  hyoscyamine (LEVSIN SL) 0.125 MG SL tablet Take one tablet sublingually 15-20 minutes before meals 01/07/17   Esterwood, Amy S, PA-C  lamoTRIgine (LAMICTAL) 200 MG tablet Take 200 mg by mouth 2 (two) times daily.     [provider]  ondansetron (ZOFRAN) 8 MG tablet Take 1 tablet (8 mg total) by mouth every 8 (eight) hours as needed for nausea. 01/07/17   Esterwood, Amy S, PA-C    OVER THE COUNTER MEDICATION Take 1 capsule by mouth daily. FDgard    [provider]  potassium chloride SA (K-DUR,KLOR-CON) 20 MEQ tablet Take 20 mEq by mouth daily. Pt was told by MD to just take "once in awhile, not often"    [provider]  ranitidine (ZANTAC) 150 MG tablet TAKE ONE (1) TABLET AT BEDTIME 06/17/17   Esterwood, Amy S, PA-C  simvastatin (ZOCOR) 40 MG tablet Take 20 mg by mouth at bedtime.     [provider]  vortioxetine HBr (TRINTELLIX) 10 MG TABS Take 1 tablet by mouth daily.    [provider]    Family History Family History  Problem Relation Age of Onset  . Heart disease Mother   . Lung cancer Father   . Diabetes Sister   . Hypertension Sister   . Diabetes Brother   . Heart disease Brother   . Ovarian cancer Sister   . Heart disease Brother   . Heart disease Brother   . Colon cancer Neg Hx     Social History Social History   Tobacco Use  . Smoking status: Never Smoker  . Smokeless tobacco: Never Used  Substance Use Topics  . Alcohol use: No  . Drug use: No     Allergies   Amoxicillin and Abilify [aripiprazole]   Review of Systems Review of Systems  Constitutional: Negative for activity change.       All ROS Neg except as noted in HPI  HENT: Negative for nosebleeds.   Eyes: Negative for photophobia and discharge.  Respiratory: Negative for cough, shortness of breath and wheezing.   Cardiovascular: Negative for chest pain and palpitations.  Gastrointestinal: Negative for abdominal pain and blood in stool.  Genitourinary: Negative for dysuria, frequency and hematuria.  Musculoskeletal: Negative for arthralgias, back pain and neck pain.  Skin: Positive for rash.  Neurological: Negative for dizziness, seizures and speech difficulty.  Psychiatric/Behavioral: Negative for confusion and hallucinations.     Physical Exam Updated Vital Signs BP (!) 121/99 (BP Location: Left Arm)   Pulse (!) 59   Temp 98.5  F (36.9 C) (Oral)   Resp 14   Ht 5\' 1"  (1.549 m)   Wt 80.7 kg (178 lb)  SpO2 95%   BMI 33.63 kg/m   Physical Exam  Constitutional: She is oriented to person, place, and time. She appears well-developed and well-nourished.  Non-toxic appearance.  HENT:  Head: Normocephalic.  Right Ear: Tympanic membrane and external ear normal.  Left Ear: Tympanic membrane and external ear normal.  Eyes: Pupils are equal, round, and reactive to light. EOM and lids are normal.  Neck: Normal range of motion. Neck supple. Carotid bruit is not present.  Trachea is midline.  There is no stridor noted.  Cardiovascular: Normal rate, regular rhythm, normal heart sounds, intact distal pulses and normal pulses.  Pulmonary/Chest: Breath sounds normal. No respiratory distress.  Patient speaks in complete sentences.  There is symmetrical rise and fall of the chest.  There is no wheezing noted.  No dyspnea whatsoever.  Abdominal: Soft. Bowel sounds are normal. There is no tenderness. There is no guarding.  Musculoskeletal: Normal range of motion.  Lymphadenopathy:       Head (right side): No submandibular adenopathy present.       Head (left side): No submandibular adenopathy present.    She has no cervical adenopathy.  Neurological: She is alert and oriented to person, place, and time. She has normal strength. No cranial nerve deficit or sensory deficit.  Skin: Skin is warm and dry. Rash noted.  The patient has a Band-Aid over a wound from an outpatient surgical site near the antecubital area of the right upper extremity.  There is minimal redness around the area.  There are no hives noted at this time.  There is no drainage from the site.  The site is not hot to touch and there is no red streaking appreciated.  Psychiatric: She has a normal mood and affect. Her speech is normal.  Nursing note and vitals reviewed.    ED Treatments / Results  Labs (all labs ordered are listed, but only abnormal results are  displayed) Labs Reviewed - No data to display  EKG None  Radiology No results found.  Procedures Procedures (including critical care time)  Medications Ordered in ED Medications - No data to display   Initial Impression / Assessment and Plan / ED Course  I have reviewed the triage vital signs and the nursing notes.  Pertinent labs & imaging results that were available during my care of the patient were reviewed by me and considered in my medical decision making (see chart for details).       Final Clinical Impressions(s) / ED Diagnoses MDM  Case discussed with Dr. Roderic Palau.  Vital signs are well within normal limits.  Pulse oximetry is 95% on room air.  Within normal limits by my interpretation.  The patient states that the itching and later the rash started 4-6 hours after the procedure.  I doubt this is reaction to the numbing agent.  Suspect the patient has a sensitivity to the type of Band-Aid that was used, or the Polysporin.  I have asked the patient to use hypoallergenic bandages, also asked her to just cleanse the wound with soap and water, and not to use the Neosporin or Polysporin for now.  Patient is to return to the emergency department immediately if any shortness of breath, hives, changes in her condition, problems, or concerns.   Final diagnoses:  Allergic reaction, initial encounter    ED Discharge Orders    None       Lily Kocher, PA-C 01/21/18 1715    Milton Ferguson, MD 01/21/18 2234

## 2018-01-21 NOTE — ED Triage Notes (Signed)
Patient reports her dermatologist removed a growth from her arm yesterday. Has small amount of redness and edema around the site. Reports itching and rash.

## 2018-02-17 DIAGNOSIS — Z85828 Personal history of other malignant neoplasm of skin: Secondary | ICD-10-CM | POA: Diagnosis not present

## 2018-02-17 DIAGNOSIS — Z08 Encounter for follow-up examination after completed treatment for malignant neoplasm: Secondary | ICD-10-CM | POA: Diagnosis not present

## 2018-04-19 DIAGNOSIS — H3540 Unspecified peripheral retinal degeneration: Secondary | ICD-10-CM | POA: Diagnosis not present

## 2018-04-19 DIAGNOSIS — H5201 Hypermetropia, right eye: Secondary | ICD-10-CM | POA: Diagnosis not present

## 2018-04-19 DIAGNOSIS — H5212 Myopia, left eye: Secondary | ICD-10-CM | POA: Diagnosis not present

## 2018-04-19 DIAGNOSIS — H524 Presbyopia: Secondary | ICD-10-CM | POA: Diagnosis not present

## 2018-05-09 ENCOUNTER — Other Ambulatory Visit: Payer: Self-pay | Admitting: Internal Medicine

## 2018-05-09 DIAGNOSIS — Z1231 Encounter for screening mammogram for malignant neoplasm of breast: Secondary | ICD-10-CM

## 2018-05-16 ENCOUNTER — Ambulatory Visit (INDEPENDENT_AMBULATORY_CARE_PROVIDER_SITE_OTHER): Payer: Medicare Other

## 2018-05-16 ENCOUNTER — Encounter (INDEPENDENT_AMBULATORY_CARE_PROVIDER_SITE_OTHER): Payer: Self-pay | Admitting: Physician Assistant

## 2018-05-16 ENCOUNTER — Telehealth (INDEPENDENT_AMBULATORY_CARE_PROVIDER_SITE_OTHER): Payer: Self-pay | Admitting: Radiology

## 2018-05-16 ENCOUNTER — Ambulatory Visit (INDEPENDENT_AMBULATORY_CARE_PROVIDER_SITE_OTHER): Payer: Medicare Other | Admitting: Physician Assistant

## 2018-05-16 DIAGNOSIS — M25551 Pain in right hip: Secondary | ICD-10-CM | POA: Diagnosis not present

## 2018-05-16 DIAGNOSIS — M5441 Lumbago with sciatica, right side: Secondary | ICD-10-CM

## 2018-05-16 MED ORDER — METHOCARBAMOL 500 MG PO TABS: 500.0000 mg | ORAL_TABLET | Freq: Every day | ORAL | 0 refills | Status: DC

## 2018-05-16 MED ORDER — METHYLPREDNISOLONE 4 MG PO TABS: ORAL_TABLET | ORAL | 0 refills | Status: DC

## 2018-05-16 MED ORDER — CYCLOBENZAPRINE HCL 10 MG PO TABS: 10.0000 mg | ORAL_TABLET | Freq: Every day | ORAL | 1 refills | Status: DC

## 2018-05-16 NOTE — Progress Notes (Signed)
Office Visit Note   Patient: Linda [name]           Date of Birth: [birth year]           MRN: [id number] Visit Date: 05/16/2018              Requested by: Crist Infante, MD [address] [place], [place] [address] PCP: Crist Infante, MD   Assessment & Plan: Visit Diagnoses:  1. Midline low back pain with right-sided sciatica, unspecified chronicity   2. Pain in right hip     Plan: Offered her formal physical therapy she defers.  Therefore back exercise handouts were given and reviewed with her.  We will place her on a Medrol Dosepak if her muscle relaxant to take at night.  Moist heat to the back.  See her back in a month to check her response to the medications and exercise if her symptoms become worse prior to that she is to return early.  Follow-Up Instructions: Return in about 1 month (around 06/13/2018).   Orders:  Orders Placed This Encounter  Procedures  . XR Lumbar Spine 2-3 Views  . XR HIP UNILAT W OR W/O PELVIS 2-3 VIEWS RIGHT   Meds ordered this encounter  Medications  . methylPREDNISolone (MEDROL) 4 MG tablet    Sig: Take as directed    Dispense:  21 tablet    Refill:  0  . DISCONTD: cyclobenzaprine (FLEXERIL) 10 MG tablet    Sig: Take 1 tablet (10 mg total) by mouth at bedtime.    Dispense:  30 tablet    Refill:  1      Procedures: No procedures performed   Clinical Data: No additional findings.   Subjective: Chief Complaint  Patient presents with  . Lower Back - Pain  . Right Hip - Pain    HPI Linda [name] is a 77 year old female who comes in today due to low back pain and right hip pain for the past 2 to 3 weeks.  States her pain is in the mid to lower back radiating down the lateral aspect of the right leg and into the posterior and lateral aspect of the mid calf.  She denies any numbness tingling down the leg.  She has had prior episodes of back pain but these usually go away pretty quickly.  She does have pain into the right buttocks and  groin area also.  7 no waking pain.  She has pain with sitting and ambulation.  No bowel or bladder dysfunction.  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  No loss of consciousness.  This fall was about 2 to 3 weeks ago. Review of Systems  Denies any fevers chills chest pain shortness of breath Please see HPI otherwise negative  Objective: Vital Signs: There were no vitals taken for this visit.  Physical Exam  Constitutional: She is oriented to person, place, and time. She appears well-developed and well-nourished. No distress.  Pulmonary/Chest: Effort normal.  Neurological: She is alert and oriented to person, place, and time.  Skin: She is not diaphoretic.  Psychiatric: She has a normal mood and affect.    Ortho Exam 5 out of 5 strength throughout the lower extremities against resistance.  Deep tendon reflexes are 2+ at the knees and equal and symmetric and 1+ at the ankles and equal and symmetric.  She has  good range of motion of both hips without pain.  Tenderness over the trochanteric region of both hips and over the IT band to the knee.  Tenderness lower lumbar spinal column and paraspinous region left and right.  Positive straight leg raise bilaterally. Specialty Comments:  No specialty comments available.  Imaging: Xr Hip Unilat W Or W/o Pelvis 2-3 Views Right  Result Date: 05/16/2018 AP pelvis and lateral view of the right hip: No acute fracture.  Bilateral hips well located.  No bony abnormalities throughout the remainder the pelvis.  Xr Lumbar Spine 2-3 Views  Result Date: 05/16/2018 2 views lumbar spine.  No acute fracture.  Grade 1 spondylolisthesis L4 on 5.  Facet arthritic changes out 4 5 and L5-S1.  Normal lordotic curvature.    PMFS History: Patient Active Problem List   Diagnosis Date Noted  . Hypertension   . High cholesterol   . Anxiety    Past  Medical History:  Diagnosis Date  . Anxiety   . Candida esophagitis ([place])   . Chronic kidney disease   . DDD (degenerative disc disease)   . Depression   . Erosive gastritis   . GERD (gastroesophageal reflux disease)   . Hiatal hernia   . High cholesterol   . History of anal fissures   . Hypertension   . Obesity   . Osteoarthritis   . Panic disorder   . Sleep apnea    Does not need CPAP anymore    Family History  Problem Relation Age of Onset  . Heart disease Mother   . Lung cancer Father   . Diabetes Sister   . Hypertension Sister   . Diabetes Brother   . Heart disease Brother   . Ovarian cancer Sister   . Heart disease Brother   . Heart disease Brother   . Colon cancer Neg Hx     Past Surgical History:  Procedure Laterality Date  . ABDOMINAL HYSTERECTOMY  1987   LSO / menorrhagia & leiomyomata  . APPENDECTOMY    . BREAST EXCISIONAL BIOPSY Left 1989   benign  . BREAST SURGERY  1989   benign tumor left breast  . CHOLECYSTECTOMY  2001  . TUBAL LIGATION     Social History   Occupational History  . Occupation: Retired   Tobacco Use  . Smoking status: Never Smoker  . Smokeless tobacco: Never Used  Substance and Sexual Activity  . Alcohol use: No  . Drug use: No  . Sexual activity: Yes

## 2018-05-16 NOTE — Telephone Encounter (Signed)
[facility] called, and their system flagged the flexeril Rx cautioning against serotonin syndrome.  I have asked Artis Delay and he approves a change to robaxin 500 ng 1 po qhs instead, pharmacy advised.

## 2018-05-22 ENCOUNTER — Emergency Department (HOSPITAL_COMMUNITY): Payer: Medicare Other

## 2018-05-22 ENCOUNTER — Emergency Department (HOSPITAL_COMMUNITY)
Admission: EM | Admit: 2018-05-22 | Discharge: 2018-05-22 | Disposition: A | Discharge status: Home / Self Care | Payer: Medicare Other | Attending: Emergency Medicine | Admitting: Emergency Medicine

## 2018-05-22 ENCOUNTER — Other Ambulatory Visit: Payer: Self-pay

## 2018-05-22 ENCOUNTER — Encounter (HOSPITAL_COMMUNITY): Payer: Self-pay | Admitting: *Deleted

## 2018-05-22 DIAGNOSIS — Y9301 Activity, walking, marching and hiking: Secondary | ICD-10-CM | POA: Insufficient documentation

## 2018-05-22 DIAGNOSIS — M898X9 Other specified disorders of bone, unspecified site: Secondary | ICD-10-CM

## 2018-05-22 DIAGNOSIS — W109XXA Fall (on) (from) unspecified stairs and steps, initial encounter: Secondary | ICD-10-CM | POA: Diagnosis not present

## 2018-05-22 DIAGNOSIS — M899 Disorder of bone, unspecified: Secondary | ICD-10-CM | POA: Insufficient documentation

## 2018-05-22 DIAGNOSIS — W19XXXA Unspecified fall, initial encounter: Secondary | ICD-10-CM

## 2018-05-22 DIAGNOSIS — I129 Hypertensive chronic kidney disease with stage 1 through stage 4 chronic kidney disease, or unspecified chronic kidney disease: Secondary | ICD-10-CM | POA: Insufficient documentation

## 2018-05-22 DIAGNOSIS — N189 Chronic kidney disease, unspecified: Secondary | ICD-10-CM | POA: Diagnosis not present

## 2018-05-22 DIAGNOSIS — Y999 Unspecified external cause status: Secondary | ICD-10-CM | POA: Diagnosis not present

## 2018-05-22 DIAGNOSIS — I10 Essential (primary) hypertension: Secondary | ICD-10-CM | POA: Diagnosis not present

## 2018-05-22 DIAGNOSIS — S0083XA Contusion of other part of head, initial encounter: Secondary | ICD-10-CM | POA: Diagnosis not present

## 2018-05-22 DIAGNOSIS — Z7982 Long term (current) use of aspirin: Secondary | ICD-10-CM | POA: Diagnosis not present

## 2018-05-22 DIAGNOSIS — S161XXA Strain of muscle, fascia and tendon at neck level, initial encounter: Secondary | ICD-10-CM

## 2018-05-22 DIAGNOSIS — Z79899 Other long term (current) drug therapy: Secondary | ICD-10-CM | POA: Diagnosis not present

## 2018-05-22 DIAGNOSIS — S199XXA Unspecified injury of neck, initial encounter: Secondary | ICD-10-CM | POA: Diagnosis not present

## 2018-05-22 DIAGNOSIS — Y9222 Religious institution as the place of occurrence of the external cause: Secondary | ICD-10-CM | POA: Insufficient documentation

## 2018-05-22 DIAGNOSIS — S0990XA Unspecified injury of head, initial encounter: Secondary | ICD-10-CM | POA: Diagnosis not present

## 2018-05-22 LAB — BASIC METABOLIC PANEL
Anion gap: 10 (ref 5–15)
BUN: 25 mg/dL — AB (ref 8–23)
CHLORIDE: 103 mmol/L (ref 98–111)
CO2: 29 mmol/L (ref 22–32)
CREATININE: 0.98 mg/dL (ref 0.44–1.00)
Calcium: 9.4 mg/dL (ref 8.9–10.3)
GFR calc Af Amer: >60 mL/min (ref >60)
GFR calc non Af Amer: 55 mL/min — ABNORMAL LOW (ref >60)
Glucose, Bld: 93 mg/dL (ref 70–99)
POTASSIUM: 3.8 mmol/L (ref 3.5–5.1)
Sodium: 142 mmol/L (ref 135–145)

## 2018-05-22 LAB — CBC
HEMATOCRIT: 46.5 % — AB (ref 36.0–46.0)
Hemoglobin: 15.8 g/dL — ABNORMAL HIGH (ref 12.0–15.0)
MCH: 32 pg (ref 26.0–34.0)
MCHC: 34 g/dL (ref 30.0–36.0)
MCV: 94.3 fL (ref 78.0–100.0)
PLATELETS: 317 10*3/uL (ref 150–400)
RBC: 4.93 MIL/uL (ref 3.87–5.11)
RDW: 12.3 % (ref 11.5–15.5)
WBC: 8.9 10*3/uL (ref 4.0–10.5)

## 2018-05-22 LAB — CBG MONITORING, ED: Glucose-Capillary: 79 mg/dL (ref 70–99)

## 2018-05-22 NOTE — ED Notes (Signed)
Patient transported to CT 

## 2018-05-22 NOTE — Discharge Instructions (Addendum)
Please return if you have any worsening headaches, become confused, or have weakness on one side versus the other Please follow-up with your doctor this week. Lytic lesion within the vertebral body of T3 warranting further evaluation. Considerations include metastatic disease or benign lesion. Further characterization with outpatient MRI is recommended.

## 2018-05-22 NOTE — ED Provider Notes (Signed)
[facility] DEPT Provider Note   CSN: [id number] Arrival date & time: 05/22/18  1022     History   Chief Complaint Chief Complaint  Patient presents with  . Fall    HPI Linda [name] is a 77 y.o. female.  HPI 77 year old female presents today after fall at church.  She was going to walk down some steps and she plan on grabbing the rail but thinks that she missed it.  She fell backwards striking her head.  There is no report of loss of consciousness.  She states she is not having any pain.  She is not on any blood thinners.  She had to be helped up.  She states she has been somewhat generally weak and has been trying to exercise by walking more.  She denies any acute episode that caused weakness although she has been having some ongoing back pain.  She does not identify any lateralized or significant or sudden onset of weakness in her lower extremities. Past Medical History:  Diagnosis Date  . Anxiety   . Candida esophagitis ([place])   . Chronic kidney disease   . DDD (degenerative disc disease)   . Depression   . Erosive gastritis   . GERD (gastroesophageal reflux disease)   . Hiatal hernia   . High cholesterol   . History of anal fissures   . Hypertension   . Obesity   . Osteoarthritis   . Panic disorder   . Sleep apnea    Does not need CPAP anymore    Patient Active Problem List   Diagnosis Date Noted  . Hypertension   . High cholesterol   . Anxiety     Past Surgical History:  Procedure Laterality Date  . ABDOMINAL HYSTERECTOMY  1987   LSO / menorrhagia & leiomyomata  . APPENDECTOMY    . BREAST EXCISIONAL BIOPSY Left 1989   benign  . BREAST SURGERY  1989   benign tumor left breast  . CHOLECYSTECTOMY  2001  . TUBAL LIGATION       OB History    Gravida  3   Para  1   Term      Preterm      AB  2   Living  1     SAB  2   TAB      Ectopic      Multiple      Live Births               Home  Medications    Prior to Admission medications   Medication Sig Start Date End Date Taking? Authorizing Provider  ALPRAZolam Duanne Moron) 1 MG tablet Take 1 mg by mouth 3 (three) times daily as needed.     Yes [provider]  amLODipine (NORVASC) 5 MG tablet Take 10 mg by mouth daily.  07/01/12  Yes [provider]  aspirin 81 MG tablet Take 81 mg by mouth daily.     Yes [provider]  benazepril (LOTENSIN) 40 MG tablet Take 40 mg by mouth daily.     Yes [provider]  BIOTIN PO Take 1 tablet by mouth daily.   Yes [provider]  BYSTOLIC 10 MG tablet Take 10 mg by mouth daily. Pt takes 1/2 tablet every day 04/21/15  Yes [provider]  desvenlafaxine (PRISTIQ) 100 MG 24 hr tablet Take 100 mg by mouth daily.  05/03/18  Yes [provider]  dexlansoprazole ([place]) 60  MG capsule Take 1 capsule (60 mg total) by mouth daily. 05/20/17  Yes Ladene Artist, MD  furosemide (LASIX) 20 MG tablet Take 20 mg by mouth daily as needed for fluid.    Yes [provider]  lamoTRIgine (LAMICTAL) 200 MG tablet Take 200 mg by mouth 2 (two) times daily.    Yes [provider]  methocarbamol (ROBAXIN) 500 MG tablet Take 1 tablet (500 mg total) by mouth at bedtime. 05/16/18  Yes Pete Pelt, PA-C  methylPREDNISolone (MEDROL) 4 MG tablet Take as directed Patient taking differently: Take 4 mg by mouth See admin instructions. Take 6 tablets on day 1, 5 tablets on day 2, 4 tablets on day 3, 3 tablets on day 4, 2 tablets on day 5, 1 tablet on day 6 05/16/18  Yes Pete Pelt, PA-C  ondansetron (ZOFRAN) 8 MG tablet Take 1 tablet (8 mg total) by mouth every 8 (eight) hours as needed for nausea. 01/07/17  Yes Esterwood, Amy S, PA-C  potassium chloride SA (K-DUR,KLOR-CON) 20 MEQ tablet Take 20 mEq by mouth daily. Only take if also taking lasix   Yes [provider]  ranitidine (ZANTAC) 150 MG tablet TAKE ONE (1) TABLET AT  BEDTIME Patient taking differently: Take 150 mg by mouth at bedtime as needed for heartburn.  06/17/17  Yes Esterwood, Amy S, PA-C  simvastatin (ZOCOR) 40 MG tablet Take 40 mg by mouth at bedtime.    Yes [provider]    Family History Family History  Problem Relation Age of Onset  . Heart disease Mother   . Lung cancer Father   . Diabetes Sister   . Hypertension Sister   . Diabetes Brother   . Heart disease Brother   . Ovarian cancer Sister   . Heart disease Brother   . Heart disease Brother   . Colon cancer Neg Hx     Social History Social History   Tobacco Use  . Smoking status: Never Smoker  . Smokeless tobacco: Never Used  Substance Use Topics  . Alcohol use: No  . Drug use: No     Allergies   Amoxicillin and Abilify [aripiprazole]   Review of Systems Review of Systems  All other systems reviewed and are negative.    Physical Exam Updated Vital Signs BP (!) 174/64 (BP Location: Right Arm)   Pulse (!) 50   Temp 97.8 F (36.6 C) (Oral)   Resp 18   Ht 1.549 m (5\' 1" )   Wt 81.6 kg (180 lb)   SpO2 100%   BMI 34.01 kg/m   Physical Exam  Constitutional: She is oriented to person, place, and time. She appears well-developed and well-nourished.  HENT:  Head: Normocephalic.  Right Ear: External ear normal.  Left Ear: External ear normal.  Nose: Nose normal.  Erythematous area on occiput with no swelling or laceration  Eyes: Pupils are equal, round, and reactive to light. Conjunctivae and EOM are normal.  Neck: Normal range of motion. Neck supple.  Some diffuse tenderness palpation of her posterior cervical spine Cervical collar to remain in place until x-rays obtained Anterior examination of neck reveals no signs of trauma  Cardiovascular: Normal rate, regular rhythm and normal heart sounds.  No external trauma noted on chest wall No crepitus   Pulmonary/Chest: Effort normal and breath sounds normal.  Abdominal: Soft. Bowel sounds are  normal.  Musculoskeletal:  Thoracic and lumbar spine palpated with no tenderness and no external signs of trauma noted  Neurological: She is alert and oriented to person, place, and time. She displays normal reflexes. No cranial nerve deficit. Coordination normal.  Skin: Skin is warm and dry. Capillary refill takes less than 2 seconds.  Psychiatric: She has a normal mood and affect.  Nursing note and vitals reviewed.    ED Treatments / Results  Labs (all labs ordered are listed, but only abnormal results are displayed) Labs Reviewed  BASIC METABOLIC PANEL  CBC  URINALYSIS, ROUTINE W REFLEX MICROSCOPIC  CBG MONITORING, ED    EKG None  Radiology No results found.  Procedures Procedures (including critical care time)  Medications Ordered in ED Medications - No data to display   Initial Impression / Assessment and Plan / ED Course  I have reviewed the triage vital signs and the nursing notes.  Pertinent labs & imaging results that were available during my care of the patient were reviewed by me and considered in my medical decision making (see chart for details).     Plan CT head and neck. Final Clinical Impressions(s) / ED Diagnoses   Final diagnoses:  Fall, initial encounter  Contusion of other part of head, initial encounter  Strain of neck muscle, initial encounter    ED Discharge Orders    None       Pattricia Boss, MD 05/22/18 1553

## 2018-05-22 NOTE — ED Triage Notes (Signed)
Pt walking up steps at church, does not know what happened but fell backwards hitting back of head, C collar placed in triage. Husband states she fell twice last month as if she looses her balance. Unsure about LOC

## 2018-05-23 ENCOUNTER — Other Ambulatory Visit: Payer: Self-pay | Admitting: Internal Medicine

## 2018-05-23 DIAGNOSIS — M999 Biomechanical lesion, unspecified: Secondary | ICD-10-CM | POA: Diagnosis not present

## 2018-05-23 DIAGNOSIS — R531 Weakness: Secondary | ICD-10-CM | POA: Diagnosis not present

## 2018-05-23 DIAGNOSIS — W19XXXA Unspecified fall, initial encounter: Secondary | ICD-10-CM | POA: Diagnosis not present

## 2018-05-23 DIAGNOSIS — Z6832 Body mass index (BMI) 32.0-32.9, adult: Secondary | ICD-10-CM | POA: Diagnosis not present

## 2018-05-23 DIAGNOSIS — T753XXA Motion sickness, initial encounter: Secondary | ICD-10-CM | POA: Diagnosis not present

## 2018-05-25 ENCOUNTER — Ambulatory Visit
Admission: RE | Admit: 2018-05-25 | Discharge: 2018-05-25 | Disposition: A | Discharge status: Home / Self Care | Payer: Medicare Other | Source: Ambulatory Visit | Attending: Internal Medicine | Admitting: Internal Medicine

## 2018-05-25 DIAGNOSIS — M999 Biomechanical lesion, unspecified: Secondary | ICD-10-CM

## 2018-05-25 DIAGNOSIS — M5124 Other intervertebral disc displacement, thoracic region: Secondary | ICD-10-CM | POA: Diagnosis not present

## 2018-05-25 MED ORDER — GADOBENATE DIMEGLUMINE 529 MG/ML IV SOLN
18.0000 mL | Freq: Once | INTRAVENOUS | Status: AC | PRN
  Administered 2018-05-25: 17 mL via INTRAVENOUS

## 2018-05-30 DIAGNOSIS — N183 Chronic kidney disease, stage 3 (moderate): Secondary | ICD-10-CM | POA: Diagnosis not present

## 2018-05-30 DIAGNOSIS — M999 Biomechanical lesion, unspecified: Secondary | ICD-10-CM | POA: Diagnosis not present

## 2018-06-06 ENCOUNTER — Ambulatory Visit: Payer: Medicare Other

## 2018-06-07 ENCOUNTER — Ambulatory Visit
Admission: RE | Admit: 2018-06-07 | Discharge: 2018-06-07 | Disposition: A | Discharge status: Home / Self Care | Payer: Medicare Other | Source: Ambulatory Visit | Attending: Internal Medicine | Admitting: Internal Medicine

## 2018-06-07 DIAGNOSIS — Z1231 Encounter for screening mammogram for malignant neoplasm of breast: Secondary | ICD-10-CM | POA: Diagnosis not present

## 2018-06-09 ENCOUNTER — Emergency Department (HOSPITAL_COMMUNITY)
Admission: EM | Admit: 2018-06-09 | Discharge: 2018-06-09 | Disposition: A | Discharge status: Home / Self Care | Payer: Medicare Other | Attending: Emergency Medicine | Admitting: Emergency Medicine

## 2018-06-09 ENCOUNTER — Encounter (HOSPITAL_COMMUNITY): Payer: Self-pay | Admitting: Emergency Medicine

## 2018-06-09 ENCOUNTER — Emergency Department (HOSPITAL_COMMUNITY): Payer: Medicare Other

## 2018-06-09 ENCOUNTER — Other Ambulatory Visit: Payer: Self-pay

## 2018-06-09 DIAGNOSIS — N189 Chronic kidney disease, unspecified: Secondary | ICD-10-CM | POA: Diagnosis not present

## 2018-06-09 DIAGNOSIS — Y929 Unspecified place or not applicable: Secondary | ICD-10-CM | POA: Insufficient documentation

## 2018-06-09 DIAGNOSIS — I129 Hypertensive chronic kidney disease with stage 1 through stage 4 chronic kidney disease, or unspecified chronic kidney disease: Secondary | ICD-10-CM | POA: Insufficient documentation

## 2018-06-09 DIAGNOSIS — Y93E8 Activity, other personal hygiene: Secondary | ICD-10-CM | POA: Insufficient documentation

## 2018-06-09 DIAGNOSIS — W01198A Fall on same level from slipping, tripping and stumbling with subsequent striking against other object, initial encounter: Secondary | ICD-10-CM | POA: Insufficient documentation

## 2018-06-09 DIAGNOSIS — S0990XA Unspecified injury of head, initial encounter: Secondary | ICD-10-CM

## 2018-06-09 DIAGNOSIS — Y999 Unspecified external cause status: Secondary | ICD-10-CM | POA: Insufficient documentation

## 2018-06-09 DIAGNOSIS — Z79899 Other long term (current) drug therapy: Secondary | ICD-10-CM | POA: Diagnosis not present

## 2018-06-09 DIAGNOSIS — W19XXXA Unspecified fall, initial encounter: Secondary | ICD-10-CM

## 2018-06-09 NOTE — ED Provider Notes (Signed)
[facility] EMERGENCY DEPARTMENT Provider Note   CSN: [id number] Arrival date & time: 06/09/18  1803     History   Chief Complaint Chief Complaint  Patient presents with  . Fall    HPI Linda [name] is a 77 y.o. female.  Status post accidental mechanical fall earlier today well standing up from the toilet.  She is the back of her head on a ceramic tub.  Slight dizziness when standing.  No neck pain or extremity trauma.  Severity is mild.  She states increased falls lately.  Husband reports normal behavior.     Past Medical History:  Diagnosis Date  . Anxiety   . Candida esophagitis ([place])   . Chronic kidney disease   . DDD (degenerative disc disease)   . Depression   . Erosive gastritis   . GERD (gastroesophageal reflux disease)   . Hiatal hernia   . High cholesterol   . History of anal fissures   . Hypertension   . Obesity   . Osteoarthritis   . Panic disorder   . Sleep apnea    Does not need CPAP anymore    Patient Active Problem List   Diagnosis Date Noted  . Hypertension   . High cholesterol   . Anxiety     Past Surgical History:  Procedure Laterality Date  . ABDOMINAL HYSTERECTOMY  1987   LSO / menorrhagia & leiomyomata  . APPENDECTOMY    . BREAST EXCISIONAL BIOPSY Left 1989   benign  . BREAST SURGERY  1989   benign tumor left breast  . CHOLECYSTECTOMY  2001  . TUBAL LIGATION       OB History    Gravida  3   Para  1   Term      Preterm      AB  2   Living  1     SAB  2   TAB      Ectopic      Multiple      Live Births               Home Medications    Prior to Admission medications   Medication Sig Start Date End Date Taking? Authorizing Provider  ALPRAZolam Duanne Moron) 1 MG tablet Take 0.5-1 mg by mouth 3 (three) times daily as needed for anxiety.    Yes [provider]  amLODipine (NORVASC) 10 MG tablet Take 10 mg by mouth daily.  07/01/12  Yes [provider]  aspirin 81 MG tablet Take 81 mg  by mouth daily.     Yes [provider]  benazepril (LOTENSIN) 40 MG tablet Take 40 mg by mouth daily.     Yes [provider]  BIOTIN PO Take 1 tablet by mouth daily.   Yes [provider]  BYSTOLIC 5 MG tablet Take 5 mg by mouth daily. 06/02/18  Yes [provider]  desvenlafaxine (PRISTIQ) 100 MG 24 hr tablet Take 100 mg by mouth daily.  05/03/18  Yes [provider]  dexlansoprazole (DEXILANT) 60 MG capsule Take 1 capsule (60 mg total) by mouth daily. Patient taking differently: Take 60 mg by mouth daily as needed (for acid reflux).  05/20/17  Yes Ladene Artist, MD  lamoTRIgine (LAMICTAL) 200 MG tablet Take 200 mg by mouth 2 (two) times daily.    Yes [provider]  meclizine (ANTIVERT) 25 MG tablet Take 25 mg by mouth 3 (three) times daily as needed for dizziness.  Yes [provider]  ondansetron (ZOFRAN) 8 MG tablet Take 1 tablet (8 mg total) by mouth every 8 (eight) hours as needed for nausea. 01/07/17  Yes Esterwood, Amy S, PA-C  simvastatin (ZOCOR) 40 MG tablet Take 40 mg by mouth at bedtime.    Yes [provider]  methocarbamol (ROBAXIN) 500 MG tablet Take 1 tablet (500 mg total) by mouth at bedtime. Patient not taking: Reported on 06/09/2018 05/16/18   Pete Pelt, PA-C  methylPREDNISolone (MEDROL) 4 MG tablet Take as directed Patient not taking: Reported on 06/09/2018 05/16/18   Pete Pelt, PA-C    Family History Family History  Problem Relation Age of Onset  . Heart disease Mother   . Lung cancer Father   . Diabetes Sister   . Hypertension Sister   . Diabetes Brother   . Heart disease Brother   . Ovarian cancer Sister   . Heart disease Brother   . Heart disease Brother   . Colon cancer Neg Hx     Social History Social History   Tobacco Use  . Smoking status: Never Smoker  . Smokeless tobacco: Never Used  Substance Use Topics  . Alcohol use: No  . Drug use: No     Allergies     Amoxicillin and Abilify [aripiprazole]   Review of Systems Review of Systems  All other systems reviewed and are negative.    Physical Exam Updated Vital Signs BP (!) 139/55 (BP Location: Left Arm)   Pulse 77   Temp 97.8 F (36.6 C) (Oral)   Resp 14   Ht 5\' 1"  (1.549 m)   Wt 78.5 kg   SpO2 99%   BMI 32.69 kg/m   Physical Exam  Constitutional: She is oriented to person, place, and time. She appears well-developed and well-nourished.  HENT:  Head: Normocephalic.  Tender IN the superior occipital area.  No hematoma noted  Eyes: Conjunctivae are normal.  Neck: Neck supple.  Cardiovascular: Normal rate and regular rhythm.  Pulmonary/Chest: Effort normal and breath sounds normal.  Abdominal: Soft. Bowel sounds are normal.  Musculoskeletal: Normal range of motion.  Neurological: She is alert and oriented to person, place, and time.  Skin: Skin is warm and dry.  Psychiatric: She has a normal mood and affect. Her behavior is normal.  Nursing note and vitals reviewed.    ED Treatments / Results  Labs (all labs ordered are listed, but only abnormal results are displayed) Labs Reviewed - No data to display  EKG None  Radiology Ct Head Wo Contrast  Result Date: 06/09/2018 CLINICAL DATA:  77 year old female with acute head injury from fall today. Initial encounter. EXAM: CT HEAD WITHOUT CONTRAST TECHNIQUE: Contiguous axial images were obtained from the base of the skull through the vertex without intravenous contrast. COMPARISON:  None. FINDINGS: Brain: No evidence of acute infarction, hemorrhage, hydrocephalus, extra-axial collection or mass lesion/mass effect. Atrophy and chronic small-vessel white matter ischemic changes again noted. Vascular: Carotid atherosclerotic calcifications again noted. Skull: Normal. Negative for fracture or focal lesion. Sinuses/Orbits: No acute finding. Other: None. IMPRESSION: 1. No evidence of acute intracranial abnormality. 2. Atrophy and  chronic small-vessel white matter ischemic changes. Electronically Signed   By: Margarette Canada M.D.   On: 06/09/2018 20:14    Procedures Procedures (including critical care time)  Medications Ordered in ED Medications - No data to display   Initial Impression / Assessment and Plan / ED Course  I have reviewed the triage vital signs and  the nursing notes.  Pertinent labs & imaging results that were available during my care of the patient were reviewed by me and considered in my medical decision making (see chart for details).     Patient reports a fall is alert today after getting up from the toilet striking the back of her head.  Normal behavior.  CT head reveals no subdural hematoma or subarachnoid bleed.  Patient observed for approximately 90 minutes with no change in her status.  Final Clinical Impressions(s) / ED Diagnoses   Final diagnoses:  Fall, initial encounter  Minor head injury, initial encounter    ED Discharge Orders    None       Nat Christen, MD 06/09/18 2135

## 2018-06-09 NOTE — Discharge Instructions (Addendum)
CT scan of your head was normal.  Tylenol or ibuprofen for headache.  Follow-up with your primary care doctor.

## 2018-06-09 NOTE — ED Triage Notes (Signed)
Pt reports falling x 4 in the past month.  [place] today when she stood up from the toilet and hit the back of her head on ceramic tub.  Pt reports if she has been sitting for a while when she fells dizzy when she stands up.

## 2018-06-10 DIAGNOSIS — R296 Repeated falls: Secondary | ICD-10-CM | POA: Insufficient documentation

## 2018-06-10 DIAGNOSIS — R531 Weakness: Secondary | ICD-10-CM | POA: Diagnosis not present

## 2018-06-10 DIAGNOSIS — R600 Localized edema: Secondary | ICD-10-CM | POA: Diagnosis not present

## 2018-06-10 DIAGNOSIS — I10 Essential (primary) hypertension: Secondary | ICD-10-CM | POA: Diagnosis not present

## 2018-06-10 DIAGNOSIS — Z6834 Body mass index (BMI) 34.0-34.9, adult: Secondary | ICD-10-CM | POA: Diagnosis not present

## 2018-06-13 ENCOUNTER — Encounter (INDEPENDENT_AMBULATORY_CARE_PROVIDER_SITE_OTHER): Payer: Self-pay | Admitting: Physician Assistant

## 2018-06-13 ENCOUNTER — Ambulatory Visit (INDEPENDENT_AMBULATORY_CARE_PROVIDER_SITE_OTHER): Payer: Medicare Other | Admitting: Physician Assistant

## 2018-06-13 DIAGNOSIS — M5441 Lumbago with sciatica, right side: Secondary | ICD-10-CM

## 2018-06-13 NOTE — Progress Notes (Signed)
Office Visit Note   Patient: Linda [name]           Date of Birth: [birth year]           MRN: [id number] Visit Date: 06/13/2018              Requested by: Crist Infante, MD [address] [place], [place] [address] PCP: Crist Infante, MD   Assessment & Plan: Visit Diagnoses: No diagnosis found.  Plan: She will follow-up with [place] on an as-needed basis pain returns in the low back hips she can always make a return appointment.  She develops any radicular pain down particular the right leg call our office and we will obtain an MRI to evaluate for HNP as source of pain.  Follow-Up Instructions: Return if symptoms worsen or fail to improve.   Orders:  No orders of the defined types were placed in this encounter.  No orders of the defined types were placed in this encounter.     Procedures: No procedures performed   Clinical Data: No additional findings.   Subjective: Chief Complaint  Patient presents with  . Lower Back - Follow-up    HPI [name] returns today follow-up of her low back pain.  She states that she is no longer having any pain in her low back.  She feels that the home exercises in the [employer]ose[employer] took care of the symptoms.  However she has had 2 falls and is been seen in the ER.  She actually underwent CT scan of her neck and was found to have a questionable lytic lesion at T3 and underwent an MRI which shows a without any signs of abnormality on the T1 and T2 sequences area.  She seen her primary care physician is felt that that she is having some vertigo which she has had in the past.  She states her gasping she is lying down.  She is set up to start physical therapy for vertigo. Review of Systems See HPI otherwise negative  Objective: Vital Signs: There were no vitals taken for this visit.  Physical Exam  Constitutional: She is oriented to person, place, and time. She appears well-developed and well-nourished. No distress.  Pulmonary/Chest:  Effort normal.  Neurological: She is alert and oriented to person, place, and time.  Skin: She is not diaphoretic.    Ortho Exam No tenderness over the greater trochanteric region of either hip.  No tenderness paraspinous region.  Negative straight leg raise bilaterally.  Ambulates without any assistive device.  Able to get up on her and without any assistance pushing off of the chair arms. Specialty Comments:  No specialty comments available.  Imaging: No results found.   PMFS History: Patient Active Problem List   Diagnosis Date Noted  . Hypertension   . High cholesterol   . Anxiety    Past Medical History:  Diagnosis Date  . Anxiety   . Candida esophagitis ([place])   . Chronic kidney disease   . DDD (degenerative disc disease)   . Depression   . Erosive gastritis   . GERD (gastroesophageal reflux disease)   . Hiatal hernia   . High cholesterol   . History of anal fissures   . Hypertension   . Obesity   . Osteoarthritis   . Panic disorder   . Sleep apnea    Does not need CPAP anymore    Family History  Problem Relation Age of Onset  . Heart disease Mother   .  Lung cancer Father   . Diabetes Sister   . Hypertension Sister   . Diabetes Brother   . Heart disease Brother   . Ovarian cancer Sister   . Heart disease Brother   . Heart disease Brother   . Colon cancer Neg Hx     Past Surgical History:  Procedure Laterality Date  . ABDOMINAL HYSTERECTOMY  1987   LSO / menorrhagia & leiomyomata  . APPENDECTOMY    . BREAST EXCISIONAL BIOPSY Left 1989   benign  . BREAST SURGERY  1989   benign tumor left breast  . CHOLECYSTECTOMY  2001  . TUBAL LIGATION     Social History   Occupational History  . Occupation: Retired   Tobacco Use  . Smoking status: Never Smoker  . Smokeless tobacco: Never Used  Substance and Sexual Activity  . Alcohol use: No  . Drug use: No  . Sexual activity: Yes

## 2018-06-23 ENCOUNTER — Encounter: Payer: Self-pay | Admitting: Gastroenterology

## 2018-06-23 ENCOUNTER — Ambulatory Visit (INDEPENDENT_AMBULATORY_CARE_PROVIDER_SITE_OTHER): Payer: Medicare Other | Admitting: Gastroenterology

## 2018-06-23 VITALS — BP 134/60 | HR 52 | Ht 61.0 in | Wt 179.1 lb

## 2018-06-23 DIAGNOSIS — R0789 Other chest pain: Secondary | ICD-10-CM | POA: Diagnosis not present

## 2018-06-23 DIAGNOSIS — K219 Gastro-esophageal reflux disease without esophagitis: Secondary | ICD-10-CM | POA: Diagnosis not present

## 2018-06-23 DIAGNOSIS — Z86010 Personal history of colonic polyps: Secondary | ICD-10-CM | POA: Diagnosis not present

## 2018-06-23 MED ORDER — DEXLANSOPRAZOLE 60 MG PO CPDR: 60.0000 mg | DELAYED_RELEASE_CAPSULE | Freq: Every day | ORAL | 3 refills | Status: DC

## 2018-06-23 NOTE — Progress Notes (Signed)
    History of Present Illness: This is a 77 year old female who notes frequent reflux symptoms, frequent nausea left lower chest pain.  She takes Dexilant intermittently.  She has frequent nausea primarily in the morning.  She notes a soreness along her left lower chest wall.  She continues to take FDgard before breakfast and dinner which she feels is effective.  EGD 03/2017 - Normal esophagus. - Small hiatal hernia. - Normal duodenal bulb and second portion of the duodenum. - No specimens collected.  Colonoscopy 05/2015 1. Sessile polyp in the ascending colon; polypectomy performed with a cold snare 2. The examination was otherwise normal   Current Medications, Allergies, Past Medical History, Past Surgical History, Family History and Social History were reviewed in [employer] record.  Physical Exam: General: Well developed, well nourished, no acute distress Head: Normocephalic and atraumatic Eyes:  sclerae anicteric, EOMI Ears: Normal auditory acuity Mouth: No deformity or lesions Lungs: Clear throughout to auscultation Chest: tenderness over lower anterior left chest  Heart: Regular rate and rhythm; no murmurs, rubs or bruits Abdomen: Soft, non tender and non distended. No masses, hepatosplenomegaly or hernias noted. Normal Bowel sounds Rectal: Not done Musculoskeletal: Symmetrical with no gross deformities  Pulses:  Normal pulses noted Extremities: No clubbing, cyanosis, edema or deformities noted Neurological: Alert oriented x 4, grossly nonfocal Psychological:  Alert and cooperative. Normal mood and affect  Assessment and Recommendations:  1. GERD.  Nausea could be related to poorly controlled reflux, dyspepsia, anxiety.  Reviewed results of EGD performed last year. Reassurance provided. Patient recommended to closely follow antireflux measures and to take Dexilant 60 mg every morning, NOT prn.  If symptoms are not adequately controlled in 6 to 8 weeks  add ranitidine 300 mg at bedtime.  Continue Zofran 8 mg 3 times daily as needed.  2. Left chest wall pain.  Advil 200 to 400 mg twice daily as needed for 1 week.  If symptoms not resolved follow-up with PCP.  3.  Dyspepsia.  Continue FDgard 2 po twice daily AC.  4.  Personal history of adenomatous colon polyps.  A 5-year interval colonoscopy is recommended in August 2021.

## 2018-06-23 NOTE — Patient Instructions (Signed)
We have sent the following medications to your pharmacy for you to pick up at your convenience: [place].   Take your Dexilant daily and not as needed.   Call back in 6-8 weeks if not significantly better.   You can take Advil 2 tablets by mouth three times a day for chest pain.   Thank you for choosing me and [facility].  Pricilla Riffle. Dagoberto Ligas., MD., Marval Regal

## 2018-07-11 DIAGNOSIS — I10 Essential (primary) hypertension: Secondary | ICD-10-CM | POA: Diagnosis not present

## 2018-07-11 DIAGNOSIS — R296 Repeated falls: Secondary | ICD-10-CM | POA: Diagnosis not present

## 2018-07-11 DIAGNOSIS — R42 Dizziness and giddiness: Secondary | ICD-10-CM | POA: Diagnosis not present

## 2018-07-11 DIAGNOSIS — Z6834 Body mass index (BMI) 34.0-34.9, adult: Secondary | ICD-10-CM | POA: Diagnosis not present

## 2018-07-16 DIAGNOSIS — Z23 Encounter for immunization: Secondary | ICD-10-CM | POA: Diagnosis not present

## 2018-07-26 ENCOUNTER — Other Ambulatory Visit: Payer: Self-pay

## 2018-07-26 ENCOUNTER — Ambulatory Visit (HOSPITAL_COMMUNITY): Payer: Medicare Other | Attending: Internal Medicine | Admitting: Physical Therapy

## 2018-07-26 DIAGNOSIS — R296 Repeated falls: Secondary | ICD-10-CM | POA: Diagnosis not present

## 2018-07-26 DIAGNOSIS — H8111 Benign paroxysmal vertigo, right ear: Secondary | ICD-10-CM

## 2018-07-26 NOTE — Patient Instructions (Addendum)
Bridging    Slowly raise buttocks from floor, keeping stomach tight. Repeat _10___ times per set. Do _1___ sets per session. Do _2___ sessions per day.  http://orth.exer.us/1096   Copyright  VHI. All rights reserved.  Balance: Unilateral   At counter  Attempt to balance on left leg, eyes open. Hold _30___ seconds. Repeat ___3_ times per set. Do _1___ sets per session. Do __2-3__ sessions per day. Perform exercise with eyes closed.  http://orth.exer.us/28   Copyright  VHI. All rights reserved.

## 2018-07-26 NOTE — Therapy (Signed)
[place], [place], [address] Phone: [phone]   Fax:  [phone]  Physical Therapy Evaluation  Patient Details  Name: Linda [name] MRN: [id number] Date of Birth: 09/22/1941 Referring Provider (PT): Berdine Dance   Encounter Date: 07/26/2018  PT End of Session - 07/26/18 1143    Visit Number  1    Number of Visits  8    Date for PT Re-Evaluation  08/25/18    PT Start Time  8242    PT Stop Time  1122    PT Time Calculation (min)  47 min    Activity Tolerance  Patient tolerated treatment well    Behavior During Therapy  [facility] for tasks assessed/performed       Past Medical History:  Diagnosis Date  . Anxiety   . Candida esophagitis ([place])   . Chronic kidney disease   . DDD (degenerative disc disease)   . Depression   . Erosive gastritis   . Falls   . GERD (gastroesophageal reflux disease)   . Hiatal hernia   . High cholesterol   . History of anal fissures   . Hypertension   . Obesity   . Osteoarthritis   . Panic disorder   . Sleep apnea    Does not need CPAP anymore    Past Surgical History:  Procedure Laterality Date  . ABDOMINAL HYSTERECTOMY  1987   LSO / menorrhagia & leiomyomata  . APPENDECTOMY    . BREAST EXCISIONAL BIOPSY Left 1989   benign  . BREAST SURGERY  1989   benign tumor left breast  . CHOLECYSTECTOMY  2001  . TUBAL LIGATION      There were no vitals filed for this visit.   Subjective Assessment - 07/26/18 1041    Subjective  PT states that she has been falling.  She has no dizziness or sign she just falls.  She then fell backwards at church and went to the ER.  She had a scan and did not find anything .  She fell backwards at home again, this time she hit her head therefore she went to the ER.  Again, scans were completed and were negative, therefore she has been referred to skilled physical therapy.  She is currently taking meclazine.  She cut way back down and fell  therefore she feels that she needs to take the pillls.      Pertinent History  HTN, OA     How long can you sit comfortably?  no problem     How long can you stand comfortably?  no problem     How long can you walk comfortably?  60 minutes     Patient Stated Goals  no more falling     Currently in Pain?  No/denies         [facility] PT Assessment - 07/26/18 0001      Assessment   Medical Diagnosis  frequent falls    Referring Provider (PT)  Katie Hayatt    Onset Date/Surgical Date  05/19/18    Next MD Visit  08/08/2018    Prior Therapy  none      Precautions   Precautions  Fall      Restrictions   Weight Bearing Restrictions  No      Balance Screen   Has the patient fallen in the past 6 months  Yes    How many times?  3    Has the patient  had a decrease in activity level because of a fear of falling?   No    Is the patient reluctant to leave their home because of a fear of falling?   No      Home Environment   Living Environment  Private residence    Home Access  Level entry      Prior Function   Level of [place]  Retired    Leisure  walking       Cognition   Overall Cognitive Status  Within Functional Limits for tasks assessed      Functional Tests   Functional tests  Single leg stance;Sit to Stand      Single Leg Stance   Comments  RT: 4;  LT: 10      Sit to Stand   Comments  5x 16.06      ROM / Strength   AROM / PROM / Strength  Strength      Strength   Strength Assessment Site  Hip;Knee;Ankle    Right/Left Hip  Right;Left    Right Hip Flexion  5/5    Right Hip Extension  3/5    Right Hip ABduction  5/5    Left Hip Flexion  5/5    Left Hip Extension  3/5    Left Hip ABduction  5/5    Right/Left Knee  Right;Left    Right Knee Extension  5/5    Left Knee Extension  5/5    Right/Left Ankle  Right;Left    Right Ankle Dorsiflexion  5/5    Left Ankle Dorsiflexion  4/5      Standardized Balance Assessment   Standardized  Balance Assessment  Timed Up and Go Test      Timed Up and Go Test   Normal TUG (seconds)  13.95           Vestibular Assessment - 07/26/18 0001      Symptom Behavior   Type of Dizziness  Spinning    Frequency of Dizziness  --   has only occured once several months ago    Duration of Dizziness  few seconds     Aggravating Factors  No known aggravating factors    Relieving Factors  No known relieving factors      Occulomotor Exam   Occulomotor Alignment  Normal    Smooth Pursuits  Intact    Saccades  Intact      Positional Testing   Dix-Hallpike  Dix-Hallpike Right;Dix-Hallpike Left      Dix-Hallpike Right   Dix-Hallpike Right Symptoms  Upbeat, left rotatory nystagmus      Dix-Hallpike Left   Dix-Hallpike Left Symptoms  No nystagmus      Positional Sensitivities   Up from Left Hallpike  Lightheadedness    Head Turning x 5  No dizziness          Objective measurements completed on examination: See above findings.      [facility] Adult PT Treatment/Exercise - 07/26/18 0001      Exercises   Exercises  Knee/Hip      Knee/Hip Exercises: Supine   Bridges  10 reps      Vestibular Treatment/Exercise - 07/26/18 0001      Vestibular Treatment/Exercise   Vestibular Treatment Provided  Canalith Repositioning    Canalith Repositioning  Epley Manuever Right       EPLEY MANUEVER RIGHT   Number of Reps   1    Overall  Response  No change            PT Education - 07/26/18 1142    Education Details  home exercise     Person(s) Educated  Patient    Methods  Explanation    Comprehension  Verbalized understanding;Returned demonstration       PT Short Term Goals - 07/26/18 1149      PT SHORT TERM GOAL #1   Title  PT gluteal maximus strength to improve to a 4/10 to be able to come sit to stand from low lying couch with confidence    Time  2    Period  Weeks    Status  New    Target Date  08/09/18      PT SHORT TERM GOAL #2   Title  PT to state that she  has had no falls; no bouts of dizziness     Time  2    Period  Weeks    Status  New      PT SHORT TERM GOAL #3   Title  Pt to be able to single leg stance bilaterally for at least 20 seconds to reduce risks of falls     Time  2    Period  Weeks    Status  New        PT Long Term Goals - 07/26/18 1151      PT LONG TERM GOAL #1   Title  PT gluteal strength to be 4+/5 to allow pt to be able to arise from a squatted postiion to pick items off the floor, work in the yard.     Time  4    Period  Weeks    Status  New    Target Date  08/23/18      PT LONG TERM GOAL #2   Title  PT to have had no falls or sx of dizziness in the past 2 weeks     Time  4    Period  Weeks    Status  New      PT LONG TERM GOAL #3   Title  PT to be able to single leg stance on both legs for 40 seconds for improve safety while walking on uneven terrain.     Time  4    Period  Weeks    Status  New             Plan - 07/26/18 1144    Clinical Impression Statement  Ms. Vandall is a 77 yo female who has had three recent falls.  She denies dizziness when she had the later falls but remembers being dizzy after the first fall.  CT and MRI scans have been completed which are negative therefore she is being referred to skilled physical therapy.  Evaluation demonstrates a postitive Right Halpike dix, increased cervical tension, decreased gluteal strength and decreased balance.  Ms. Vice will benefit from skilled physical therapy to address these issues and decrease her falls.      History and Personal Factors relevant to plan of care:  HTN     Clinical Presentation  Stable    Clinical Decision Making  Moderate    Rehab Potential  Good    PT Frequency  2x / week    PT Duration  4 weeks    PT Treatment/Interventions  Balance training;Neuromuscular re-education;Manual techniques;Canalith Repostioning    PT Next Visit Plan  complete Halpike on Right if positive complete  EPLys on right; begin higher  balance activities with head turns to promote cervical ROM.      PT Home Exercise Plan  bridge, SLS     Consulted and Agree with Plan of Care  Patient       Patient will benefit from skilled therapeutic intervention in order to improve the following deficits and impairments:  Decreased balance, Decreased strength, Impaired flexibility, Dizziness  Visit Diagnosis: BPPV (benign paroxysmal positional vertigo), right  Frequent falls     Problem List Patient Active Problem List   Diagnosis Date Noted  . Acute right-sided low back pain with right-sided sciatica 06/13/2018  . Hypertension   . High cholesterol   . [place], PT CLT [phone] 07/26/2018, 11:55 AM  [place], [place], [address] Phone: [phone]   Fax:  [phone]  Name: Linda [name] MRN: [id number] Date of Birth: April 19, 1941

## 2018-07-29 ENCOUNTER — Telehealth (HOSPITAL_COMMUNITY): Payer: Self-pay | Admitting: Internal Medicine

## 2018-07-29 NOTE — Telephone Encounter (Signed)
07/29/18  Called to offer a 10:30 appt today but she said that she had a hair appointment and couldn't come

## 2018-08-01 ENCOUNTER — Encounter

## 2018-08-01 DIAGNOSIS — I10 Essential (primary) hypertension: Secondary | ICD-10-CM | POA: Diagnosis not present

## 2018-08-01 DIAGNOSIS — R82998 Other abnormal findings in urine: Secondary | ICD-10-CM | POA: Diagnosis not present

## 2018-08-01 DIAGNOSIS — Z13820 Encounter for screening for osteoporosis: Secondary | ICD-10-CM | POA: Diagnosis not present

## 2018-08-01 DIAGNOSIS — E7849 Other hyperlipidemia: Secondary | ICD-10-CM | POA: Diagnosis not present

## 2018-08-03 ENCOUNTER — Ambulatory Visit (HOSPITAL_COMMUNITY): Payer: Medicare Other

## 2018-08-03 ENCOUNTER — Encounter (HOSPITAL_COMMUNITY): Payer: Self-pay

## 2018-08-03 DIAGNOSIS — H8111 Benign paroxysmal vertigo, right ear: Secondary | ICD-10-CM | POA: Diagnosis not present

## 2018-08-03 DIAGNOSIS — R296 Repeated falls: Secondary | ICD-10-CM | POA: Diagnosis not present

## 2018-08-03 NOTE — Therapy (Signed)
[facility] [address] [place], [place], [address] Phone: [phone]   Fax:  [phone]  Physical Therapy Treatment  Patient Details  Name: Linda [name] MRN: [id number] Date of Birth: 28-May-1941 Referring Provider (PT): Berdine Dance   Encounter Date: 08/03/2018  PT End of Session - 08/03/18 1127    Visit Number  2    Number of Visits  8    Date for PT Re-Evaluation  08/23/18    Authorization Type  Medicare A & B    Authorization Time Period  10/08-->08/23/18    PT Start Time  1120    PT Stop Time  1201    PT Time Calculation (min)  41 min    Equipment Utilized During Treatment  Gait belt    Activity Tolerance  Patient tolerated treatment well    Behavior During Therapy  [facility] for tasks assessed/performed       Past Medical History:  Diagnosis Date  . Anxiety   . Candida esophagitis ([place])   . Chronic kidney disease   . DDD (degenerative disc disease)   . Depression   . Erosive gastritis   . Falls   . GERD (gastroesophageal reflux disease)   . Hiatal hernia   . High cholesterol   . History of anal fissures   . Hypertension   . Obesity   . Osteoarthritis   . Panic disorder   . Sleep apnea    Does not need CPAP anymore    Past Surgical History:  Procedure Laterality Date  . ABDOMINAL HYSTERECTOMY  1987   LSO / menorrhagia & leiomyomata  . APPENDECTOMY    . BREAST EXCISIONAL BIOPSY Left 1989   benign  . BREAST SURGERY  1989   benign tumor left breast  . CHOLECYSTECTOMY  2001  . TUBAL LIGATION      There were no vitals filed for this visit.  Subjective Assessment - 08/03/18 1126    Subjective  Pt stated she is feeling good today, no reports of dizziness for at least a month.  Feels the medication helps with the dizziness.    Pertinent History  HTN, OA     Patient Stated Goals  no more falling     Currently in Pain?  No/denies             Vestibular Assessment - 08/03/18 0001      Occulomotor  Exam   Occulomotor Alignment  Normal      Positional Testing   Dix-Hallpike  Dix-Hallpike Right      Dix-Hallpike Right   Dix-Hallpike Right Symptoms  No nystagmus      Dix-Hallpike Left   Dix-Hallpike Left Symptoms  No nystagmus               [facility] Adult PT Treatment/Exercise - 08/03/18 0001      Exercises   Exercises  Knee/Hip      Knee/Hip Exercises: Standing   Hip Abduction  Both;10 reps;Knee straight    Abduction Limitations  2" holds    Hip Extension  Both;10 reps;Knee straight    Extension Limitations  2-3" holds      Knee/Hip Exercises: Supine   Bridges  10 reps    Bridges Limitations  2 sets      Vestibular Treatment/Exercise - 08/03/18 0001      Vestibular Treatment/Exercise   Vestibular Treatment Provided  Habituation;Gaze    Habituation Exercises  Standing Horizontal Head Turns;Standing Vertical Head Turns  Gaze Exercises  X2 Viewing Horizontal;Eye/Head Exercise Horizontal;Eye/Head Exercise Vertical      Standing Horizontal Head Turns   Number of Reps   10    Symptom Description   oringiall NBOS          Balance Exercises - 08/03/18 1158      Balance Exercises: Standing   Standing Eyes Opened  Solid surface;Foam/compliant surface   eye and head movements first solid then foam   Tandem Stance  Eyes open;Intermittent upper extremity support;Foam/compliant surface   solid then foam with static then eye and head movements   SLS  Eyes open;Solid surface;5 reps   Lt 10", Rt 3" max   Sit to Stand Time  10x no HHA          PT Short Term Goals - 07/26/18 1149      PT SHORT TERM GOAL #1   Title  PT gluteal maximus strength to improve to a 4/10 to be able to come sit to stand from low lying couch with confidence    Time  2    Period  Weeks    Status  New    Target Date  08/09/18      PT SHORT TERM GOAL #2   Title  PT to state that she has had no falls; no bouts of dizziness     Time  2    Period  Weeks    Status  New      PT SHORT  TERM GOAL #3   Title  Pt to be able to single leg stance bilaterally for at least 20 seconds to reduce risks of falls     Time  2    Period  Weeks    Status  New        PT Long Term Goals - 07/26/18 1151      PT LONG TERM GOAL #1   Title  PT gluteal strength to be 4+/5 to allow pt to be able to arise from a squatted postiion to pick items off the floor, work in the yard.     Time  4    Period  Weeks    Status  New    Target Date  08/23/18      PT LONG TERM GOAL #2   Title  PT to have had no falls or sx of dizziness in the past 2 weeks     Time  4    Period  Weeks    Status  New      PT LONG TERM GOAL #3   Title  PT to be able to single leg stance on both legs for 40 seconds for improve safety while walking on uneven terrain.     Time  4    Period  Weeks    Status  New            Plan - 08/03/18 1235    Clinical Impression Statement  Reviewed goals and assured compliance with HEP.  Pt able to recall and demonstrate appropriate mechanics following min cueing for leg position with bridges.  Pt negative for nystatgmus and no s/s of dizziness through session.  Session focus on balance activities with static eye and head movements on solid and dynamic surfaces.  Min A for LOB on foam and cueing to improve focus with eye movement exercises.  Also added gluteal strengthening exercises, pt able to complete with min cueing for form and technqiue.  No reports of pain  or dizziness through session.      Rehab Potential  Good    PT Frequency  2x / week    PT Duration  4 weeks    PT Next Visit Plan  complete Halpike on Right if positive complete  EPLys on right; continue with higher balance activities with head turns to promote cervical ROM.      PT Home Exercise Plan  bridge, SLS        Patient will benefit from skilled therapeutic intervention in order to improve the following deficits and impairments:  Decreased balance, Decreased strength, Impaired flexibility, Dizziness  Visit  Diagnosis: BPPV (benign paroxysmal positional vertigo), right  Frequent falls     Problem List Patient Active Problem List   Diagnosis Date Noted  . Acute right-sided low back pain with right-sided sciatica 06/13/2018  . Hypertension   . High cholesterol   . [place], [place]; [place]  Aldona Lento 08/03/2018, 12:47 PM  [place], [place], [address] Phone: [phone]   Fax:  [phone]  Name: Linda [name] MRN: [id number] Date of Birth: 1941/05/17

## 2018-08-08 DIAGNOSIS — R808 Other proteinuria: Secondary | ICD-10-CM | POA: Diagnosis not present

## 2018-08-08 DIAGNOSIS — Z1231 Encounter for screening mammogram for malignant neoplasm of breast: Secondary | ICD-10-CM | POA: Diagnosis not present

## 2018-08-08 DIAGNOSIS — E7849 Other hyperlipidemia: Secondary | ICD-10-CM | POA: Diagnosis not present

## 2018-08-08 DIAGNOSIS — F339 Major depressive disorder, recurrent, unspecified: Secondary | ICD-10-CM | POA: Diagnosis not present

## 2018-08-08 DIAGNOSIS — R296 Repeated falls: Secondary | ICD-10-CM | POA: Diagnosis not present

## 2018-08-08 DIAGNOSIS — R42 Dizziness and giddiness: Secondary | ICD-10-CM | POA: Diagnosis not present

## 2018-08-08 DIAGNOSIS — Z0000 Encounter for general adult medical examination without abnormal findings: Secondary | ICD-10-CM | POA: Diagnosis not present

## 2018-08-08 DIAGNOSIS — Z23 Encounter for immunization: Secondary | ICD-10-CM | POA: Diagnosis not present

## 2018-08-08 DIAGNOSIS — Z780 Asymptomatic menopausal state: Secondary | ICD-10-CM | POA: Diagnosis not present

## 2018-08-08 DIAGNOSIS — N183 Chronic kidney disease, stage 3 (moderate): Secondary | ICD-10-CM | POA: Diagnosis not present

## 2018-08-08 DIAGNOSIS — R531 Weakness: Secondary | ICD-10-CM | POA: Diagnosis not present

## 2018-08-08 DIAGNOSIS — Z1389 Encounter for screening for other disorder: Secondary | ICD-10-CM | POA: Diagnosis not present

## 2018-08-08 DIAGNOSIS — R011 Cardiac murmur, unspecified: Secondary | ICD-10-CM | POA: Insufficient documentation

## 2018-08-08 DIAGNOSIS — Z6835 Body mass index (BMI) 35.0-35.9, adult: Secondary | ICD-10-CM | POA: Diagnosis not present

## 2018-08-09 ENCOUNTER — Ambulatory Visit (HOSPITAL_COMMUNITY): Payer: Medicare Other | Admitting: Physical Therapy

## 2018-08-09 DIAGNOSIS — R296 Repeated falls: Secondary | ICD-10-CM | POA: Diagnosis not present

## 2018-08-09 DIAGNOSIS — H8111 Benign paroxysmal vertigo, right ear: Secondary | ICD-10-CM | POA: Diagnosis not present

## 2018-08-09 NOTE — Therapy (Signed)
[place], [place], [address] Phone: [phone]   Fax:  [phone]  Physical Therapy Treatment  Patient Details  Name: Linda [name] MRN: [id number] Date of Birth: 01-30-1941 Referring Provider (PT): Berdine Dance   Encounter Date: 08/09/2018  PT End of Session - 08/09/18 1205    Visit Number  3    Number of Visits  8    Date for PT Re-Evaluation  08/23/18    Authorization Type  Medicare A & B    Authorization Time Period  10/08-->08/23/18    PT Start Time  1118    PT Stop Time  1200    PT Time Calculation (min)  42 min    Equipment Utilized During Treatment  Gait belt    Activity Tolerance  Patient tolerated treatment well    Behavior During Therapy  [facility] for tasks assessed/performed       Past Medical History:  Diagnosis Date  . Anxiety   . Candida esophagitis ([place])   . Chronic kidney disease   . DDD (degenerative disc disease)   . Depression   . Erosive gastritis   . Falls   . GERD (gastroesophageal reflux disease)   . Hiatal hernia   . High cholesterol   . History of anal fissures   . Hypertension   . Obesity   . Osteoarthritis   . Panic disorder   . Sleep apnea    Does not need CPAP anymore    Past Surgical History:  Procedure Laterality Date  . ABDOMINAL HYSTERECTOMY  1987   LSO / menorrhagia & leiomyomata  . APPENDECTOMY    . BREAST EXCISIONAL BIOPSY Left 1989   benign  . BREAST SURGERY  1989   benign tumor left breast  . CHOLECYSTECTOMY  2001  . TUBAL LIGATION      There were no vitals filed for this visit.  Subjective Assessment - 08/09/18 1122    Subjective  Pt states no current dizziness, pain or issues.  States she has fallen, usually backward, several times in the past couple weeks.  Husband would like [place] to work on transfers out of the floor as she has difficulty completing this.     Currently in Pain?  No/denies                       [facility] Adult  PT Treatment/Exercise - 08/09/18 0001      Exercises   Exercises  Knee/Hip      Knee/Hip Exercises: Standing   Hip Abduction  Both;10 reps;Knee straight    Abduction Limitations  2" holds    Hip Extension  Both;10 reps;Knee straight    Extension Limitations  2-3" holds      Knee/Hip Exercises: Prone   Other Prone Exercises  floor to standing transfers using mats in floor          Balance Exercises - 08/09/18 1127      Balance Exercises: Standing   Standing Eyes Opened  Solid surface;Foam/compliant surface    Tandem Stance  Eyes open;Intermittent upper extremity support;Foam/compliant surface   foam static stance, 1# bar UE flexion 10 reps each   SLS  Eyes open;Solid surface;5 reps   5" max on both   SLS with Vectors  Solid surface;5 reps;Limitations   5 sec holds with 1 HHA   Sit to Stand Time  10x no HHA          PT  Short Term Goals - 07/26/18 1149      PT SHORT TERM GOAL #1   Title  PT gluteal maximus strength to improve to a 4/10 to be able to come sit to stand from low lying couch with confidence    Time  2    Period  Weeks    Status  New    Target Date  08/09/18      PT SHORT TERM GOAL #2   Title  PT to state that she has had no falls; no bouts of dizziness     Time  2    Period  Weeks    Status  New      PT SHORT TERM GOAL #3   Title  Pt to be able to single leg stance bilaterally for at least 20 seconds to reduce risks of falls     Time  2    Period  Weeks    Status  New        PT Long Term Goals - 07/26/18 1151      PT LONG TERM GOAL #1   Title  PT gluteal strength to be 4+/5 to allow pt to be able to arise from a squatted postiion to pick items off the floor, work in the yard.     Time  4    Period  Weeks    Status  New    Target Date  08/23/18      PT LONG TERM GOAL #2   Title  PT to have had no falls or sx of dizziness in the past 2 weeks     Time  4    Period  Weeks    Status  New      PT LONG TERM GOAL #3   Title  PT to be able  to single leg stance on both legs for 40 seconds for improve safety while walking on uneven terrain.     Time  4    Period  Weeks    Status  New            Plan - 08/09/18 1206    Clinical Impression Statement  contiued to work on improving overall stability and LE strength.  PT requires verbal/tactile cues throughout session to attend to task and complete in correct form.  contnues to struggle with SLS holds with added vector actvity to improve this.  Worked on transfer from floor, however unable to complete without object to pull up on to standing.  Difficutly rolling over to quadruped initially when going to Lt but able to complete going to Rt.  Pt without any issues at end of session.     Rehab Potential  Good    PT Frequency  2x / week    PT Duration  4 weeks    PT Next Visit Plan  complete Halpike on Right if positive complete  EPLys on right; continue with higher balance activities with head turns to promote cervical ROM.   PRogress dynamic balance with addition of tandem, retro and sidestepping.  continue to work on floor transfers.     PT Home Exercise Plan  bridge, SLS        Patient will benefit from skilled therapeutic intervention in order to improve the following deficits and impairments:  Decreased balance, Decreased strength, Impaired flexibility, Dizziness  Visit Diagnosis: Frequent falls  BPPV (benign paroxysmal positional vertigo), right     Problem List Patient Active Problem List  Diagnosis Date Noted  . Acute right-sided low back pain with right-sided sciatica 06/13/2018  . Hypertension   . High cholesterol   . Anxiety    Teena Irani, PTA/CLT [phone]  Teena Irani 08/09/2018, 12:11 PM  [place], [place], [address] Phone: [phone]   Fax:  [phone]  Name: Linda [name] MRN: [id number] Date of Birth: November 04, 1940

## 2018-08-10 DIAGNOSIS — J310 Chronic rhinitis: Secondary | ICD-10-CM | POA: Diagnosis not present

## 2018-08-10 DIAGNOSIS — R42 Dizziness and giddiness: Secondary | ICD-10-CM | POA: Diagnosis not present

## 2018-08-10 DIAGNOSIS — H6123 Impacted cerumen, bilateral: Secondary | ICD-10-CM | POA: Diagnosis not present

## 2018-08-11 ENCOUNTER — Ambulatory Visit (HOSPITAL_COMMUNITY): Payer: Medicare Other | Admitting: Physical Therapy

## 2018-08-11 DIAGNOSIS — H8111 Benign paroxysmal vertigo, right ear: Secondary | ICD-10-CM

## 2018-08-11 DIAGNOSIS — R296 Repeated falls: Secondary | ICD-10-CM | POA: Diagnosis not present

## 2018-08-11 NOTE — Therapy (Signed)
[place], [place], [address] Phone: [phone]   Fax:  [phone]  Physical Therapy Treatment  Patient Details  Name: Linda [name] MRN: [id number] Date of Birth: 02-21-1941 Referring Provider (PT): Berdine Dance   Encounter Date: 08/11/2018  PT End of Session - 08/11/18 1202    Visit Number  4    Number of Visits  8    Date for PT Re-Evaluation  08/23/18    Authorization Type  Medicare A & B    Authorization Time Period  10/08-->08/23/18    PT Start Time  1116    PT Stop Time  1157    PT Time Calculation (min)  41 min    Equipment Utilized During Treatment  Gait belt    Activity Tolerance  Patient tolerated treatment well    Behavior During Therapy  [facility] for tasks assessed/performed       Past Medical History:  Diagnosis Date  . Anxiety   . Candida esophagitis ([place])   . Chronic kidney disease   . DDD (degenerative disc disease)   . Depression   . Erosive gastritis   . Falls   . GERD (gastroesophageal reflux disease)   . Hiatal hernia   . High cholesterol   . History of anal fissures   . Hypertension   . Obesity   . Osteoarthritis   . Panic disorder   . Sleep apnea    Does not need CPAP anymore    Past Surgical History:  Procedure Laterality Date  . ABDOMINAL HYSTERECTOMY  1987   LSO / menorrhagia & leiomyomata  . APPENDECTOMY    . BREAST EXCISIONAL BIOPSY Left 1989   benign  . BREAST SURGERY  1989   benign tumor left breast  . CHOLECYSTECTOMY  2001  . TUBAL LIGATION      There were no vitals filed for this visit.  Subjective Assessment - 08/11/18 1125    Subjective  pt states no issues, dizziness or soreness today.      Currently in Pain?  No/denies                       [facility] Adult PT Treatment/Exercise - 08/11/18 0001      Exercises   Exercises  Knee/Hip      Knee/Hip Exercises: Standing   Forward Lunges  Both;10 reps    Forward Lunges Limitations   onto 4" box without UE assist    Hip Abduction  Both;10 reps;Knee straight;2 sets    Abduction Limitations  2" holds    Hip Extension  Both;10 reps;Knee straight;2 sets    Extension Limitations  2-3" holds      Knee/Hip Exercises: Prone   Other Prone Exercises  floor to standing transfers using mats in floor          Balance Exercises - 08/11/18 1130      Balance Exercises: Standing   Standing Eyes Opened  Other (comment)    Tandem Stance  Eyes open;Intermittent upper extremity support;Foam/compliant surface   foam static stance, 1# bar UE flexion 10 reps each   SLS  Eyes open;Solid surface;5 reps   5" max on both   SLS with Vectors  Solid surface;5 reps;Limitations   5 sec holds with 1 HHA   Tandem Gait  2 reps    Retro Gait  2 reps    Sidestepping  2 reps    Sit to Stand Time  10x no HHA          PT Short Term Goals - 07/26/18 1149      PT SHORT TERM GOAL #1   Title  PT gluteal maximus strength to improve to a 4/10 to be able to come sit to stand from low lying couch with confidence    Time  2    Period  Weeks    Status  New    Target Date  08/09/18      PT SHORT TERM GOAL #2   Title  PT to state that she has had no falls; no bouts of dizziness     Time  2    Period  Weeks    Status  New      PT SHORT TERM GOAL #3   Title  Pt to be able to single leg stance bilaterally for at least 20 seconds to reduce risks of falls     Time  2    Period  Weeks    Status  New        PT Long Term Goals - 07/26/18 1151      PT LONG TERM GOAL #1   Title  PT gluteal strength to be 4+/5 to allow pt to be able to arise from a squatted postiion to pick items off the floor, work in the yard.     Time  4    Period  Weeks    Status  New    Target Date  08/23/18      PT LONG TERM GOAL #2   Title  PT to have had no falls or sx of dizziness in the past 2 weeks     Time  4    Period  Weeks    Status  New      PT LONG TERM GOAL #3   Title  PT to be able to single leg  stance on both legs for 40 seconds for improve safety while walking on uneven terrain.     Time  4    Period  Weeks    Status  New            Plan - 08/11/18 1203    Clinical Impression Statement  continued focus on improving stability and LE strength.  PRogressed to dynamic gait actvities including tandem, retro and side stepping.  Pt wtih most difficulty completing tandem with cues to make larger steps and place feet more in a line rather than to the side.  Pt required mod assist with this activity.  Pt able to complete floor to stand transfer with increased ease today compared to last session.  Pt would be unable to get out of floor without object to help pull herself up with due to LE weakness.   Overall improving.     Rehab Potential  Good    PT Frequency  2x / week    PT Duration  4 weeks    PT Next Visit Plan  PRogress to higher level  balance activities.  continue to work on floor transfers.     PT Home Exercise Plan  bridge, SLS        Patient will benefit from skilled therapeutic intervention in order to improve the following deficits and impairments:  Decreased balance, Decreased strength, Impaired flexibility, Dizziness  Visit Diagnosis: Frequent falls  BPPV (benign paroxysmal positional vertigo), right     Problem List Patient Active Problem List   Diagnosis Date Noted  .  Acute right-sided low back pain with right-sided sciatica 06/13/2018  . Hypertension   . High cholesterol   . Anxiety    Teena Irani, PTA/CLT [phone]  Teena Irani 08/11/2018, 12:08 PM  [place] [address] [place], [place], [address] Phone: [phone]   Fax:  [phone]  Name: Linda [name] MRN: [id number] Date of Birth: 1941/08/11

## 2018-08-15 ENCOUNTER — Encounter (HOSPITAL_COMMUNITY): Payer: Medicare Other | Admitting: Physical Therapy

## 2018-08-17 ENCOUNTER — Telehealth (HOSPITAL_COMMUNITY): Payer: Self-pay | Admitting: Internal Medicine

## 2018-08-17 ENCOUNTER — Ambulatory Visit (HOSPITAL_COMMUNITY): Payer: Medicare Other | Admitting: Physical Therapy

## 2018-08-17 NOTE — Telephone Encounter (Signed)
08/17/18  pt cx said that she was sick... we rescehduled the appt for 11/7

## 2018-08-18 DIAGNOSIS — H903 Sensorineural hearing loss, bilateral: Secondary | ICD-10-CM | POA: Diagnosis not present

## 2018-08-18 DIAGNOSIS — R42 Dizziness and giddiness: Secondary | ICD-10-CM | POA: Diagnosis not present

## 2018-08-19 ENCOUNTER — Encounter (HOSPITAL_COMMUNITY): Payer: Medicare Other | Admitting: Physical Therapy

## 2018-08-23 ENCOUNTER — Ambulatory Visit (HOSPITAL_COMMUNITY): Payer: Medicare Other | Attending: Internal Medicine | Admitting: Physical Therapy

## 2018-08-23 DIAGNOSIS — R296 Repeated falls: Secondary | ICD-10-CM | POA: Diagnosis not present

## 2018-08-23 NOTE — Therapy (Signed)
[place], [place], [address] Phone: [phone]   Fax:  [phone]  Physical Therapy Treatment  Patient Details  Name: Linda [name] MRN: [id number] Date of Birth: [birth year] Referring Provider (PT): Berdine Dance   Encounter Date: 08/23/2018  PT End of Session - 08/23/18 1119    Visit Number  5    Number of Visits  8    Date for PT Re-Evaluation  08/23/18    Authorization Type  Medicare A & B    Authorization Time Period  10/08-->08/23/18    PT Start Time  1035    PT Stop Time  1116    PT Time Calculation (min)  41 min    Equipment Utilized During Treatment  Gait belt    Activity Tolerance  Patient tolerated treatment well    Behavior During Therapy  [facility] for tasks assessed/performed       Past Medical History:  Diagnosis Date  . Anxiety   . Candida esophagitis ([place])   . Chronic kidney disease   . DDD (degenerative disc disease)   . Depression   . Erosive gastritis   . Falls   . GERD (gastroesophageal reflux disease)   . Hiatal hernia   . High cholesterol   . History of anal fissures   . Hypertension   . Obesity   . Osteoarthritis   . Panic disorder   . Sleep apnea    Does not need CPAP anymore    Past Surgical History:  Procedure Laterality Date  . ABDOMINAL HYSTERECTOMY  1987   LSO / menorrhagia & leiomyomata  . APPENDECTOMY    . BREAST EXCISIONAL BIOPSY Left 1989   benign  . BREAST SURGERY  1989   benign tumor left breast  . CHOLECYSTECTOMY  2001  . TUBAL LIGATION      There were no vitals filed for this visit.  Subjective Assessment - 08/23/18 1040    Subjective  PT states that she is doing better.  She is not doing her exercises  but two times a week.     Pertinent History  HTN, OA     Patient Stated Goals  no more falling     Currently in Pain?  No/denies                   Balance Exercises - 08/23/18 1041      Balance Exercises: Standing   Standing  Eyes Closed  Narrow base of support (BOS);Foam/compliant surface;3 reps    Tandem Stance  Eyes open;Foam/compliant surface;2 reps    SLS with Vectors  Solid surface;3 reps;10 secs    Wall Bumps  Shoulder;Hip    Wall Bumps-Shoulders  Eyes opened;10 reps    Wall Bumps-Hips  Eyes opened;10 reps    Rockerboard  Lateral;10 reps    Tandem Gait  Foam/compliant surface;1 rep    Sidestepping  Foam/compliant support;2 reps    Marching Limitations  on foam x 15     Sit to Stand Time  15 from low position on mat no HH           PT Short Term Goals - 07/26/18 1149      PT SHORT TERM GOAL #1   Title  PT gluteal maximus strength to improve to a 4/10 to be able to come sit to stand from low lying couch with confidence    Time  2    Period  Weeks  Status  New    Target Date  08/09/18      PT SHORT TERM GOAL #2   Title  PT to state that she has had no falls; no bouts of dizziness     Time  2    Period  Weeks    Status  New      PT SHORT TERM GOAL #3   Title  Pt to be able to single leg stance bilaterally for at least 20 seconds to reduce risks of falls     Time  2    Period  Weeks    Status  New        PT Long Term Goals - 07/26/18 1151      PT LONG TERM GOAL #1   Title  PT gluteal strength to be 4+/5 to allow pt to be able to arise from a squatted postiion to pick items off the floor, work in the yard.     Time  4    Period  Weeks    Status  New    Target Date  08/23/18      PT LONG TERM GOAL #2   Title  PT to have had no falls or sx of dizziness in the past 2 weeks     Time  4    Period  Weeks    Status  New      PT LONG TERM GOAL #3   Title  PT to be able to single leg stance on both legs for 40 seconds for improve safety while walking on uneven terrain.     Time  4    Period  Weeks    Status  New            Plan - 08/23/18 1119    Clinical Impression Statement  Added time to vectors, wall bumps and rocker board to pt program.  Pt is  improving in balance but  tends to supinate her feet which may have to do with her history of falling.     Rehab Potential  Good    PT Frequency  2x / week    PT Duration  4 weeks    PT Next Visit Plan  Let pt know when she buys shoes she may want to speak to salesman about shoes that help to limit supination.     PT Home Exercise Plan  bridge, SLS        Patient will benefit from skilled therapeutic intervention in order to improve the following deficits and impairments:  Decreased balance, Decreased strength, Impaired flexibility, Dizziness  Visit Diagnosis: Frequent falls     Problem List Patient Active Problem List   Diagnosis Date Noted  . Acute right-sided low back pain with right-sided sciatica 06/13/2018  . Hypertension   . High cholesterol   . [place], PT CLT [phone] 08/23/2018, 11:23 AM  [place], [place], [address] Phone: [phone]   Fax:  [phone]  Name: Linda [name] MRN: [id number] Date of Birth: [birth year]

## 2018-08-24 DIAGNOSIS — H8123 Vestibular neuronitis, bilateral: Secondary | ICD-10-CM | POA: Diagnosis not present

## 2018-08-25 ENCOUNTER — Ambulatory Visit (HOSPITAL_COMMUNITY): Payer: Medicare Other | Admitting: Physical Therapy

## 2018-08-25 DIAGNOSIS — R296 Repeated falls: Secondary | ICD-10-CM | POA: Diagnosis not present

## 2018-08-25 NOTE — Patient Instructions (Addendum)
Marching in Place: Varied Surfaces    March in place, slowly lifting knees toward ceiling. Repeat _10___ times per session. Do _1___ sessions per day.  Repeat on compliant surface: ________.  Copyright  VHI. All rights reserved.  Feet Heel-Toe "Tandem", Head Motion - Eyes Open    With eyes open, right foot directly in front of the other, move head slowly: up and down. Repeat _5___ times per session. Do __1__ sessions per day.  Copyright  VHI. All rights reserved.  Balance: Three-Way Leg Swing    Stand on left foot, hands on hips. Reach other foot forward _5___ times, sideways __5__ times, back ___5_ times. Hold each position _2___ seconds. Relax. Repeat ____ times per set. Do ____ sets per session. Do ____ sessions per day.  http://orth.exer.us/86   Copyright  VHI. All rights reserved.  Functional Quadriceps: Sit to Stand    Sit on edge of chair, feet flat on floor. Stand upright, extending knees fully. Repeat 10____ times per set. Do _1___ sets per session. Do __2__ sessions per day.  http://orth.exer.us/734   Copyright  VHI. All rights reserved.  Hip Extension (Prone)    Lift left leg ___3_ inches from floor, keeping knee locked. Repeat __10__ times per set. Do __1__ sets per session. Do ___2_ sessions per day.  http://orth.exer.us/98   Copyright  VHI. All rights reserved.

## 2018-08-25 NOTE — Therapy (Signed)
[facility] [address] [place], [place], [address] Phone: [phone]   Fax:  [phone]  Physical Therapy Treatment/Discharge  Patient Details  Name: Linda [name] MRN: [id number] Date of Birth: [birth year] Referring Provider (PT): Katie Hayatt PHYSICAL THERAPY DISCHARGE SUMMARY  Visits from Start of Care: 6  Current functional level related to goals / functional outcomes: See below   Remaining deficits: Balance; hip extensor strength   Education / Equipment: HEP Plan: Patient agrees to discharge.  Patient goals were not met. Patient is being discharged due to meeting the stated rehab goals.  ?????      Encounter Date: 08/25/2018  PT End of Session - 08/25/18 1517    Visit Number  6    Number of Visits  6    Date for PT Re-Evaluation  08/23/18    Authorization Type  Medicare A & B    Authorization Time Period  10/08-->08/23/18    PT Start Time  1520    PT Stop Time  1600    PT Time Calculation (min)  40 min    Equipment Utilized During Treatment  Gait belt    Activity Tolerance  Patient tolerated treatment well    Behavior During Therapy  WFL for tasks assessed/performed       Past Medical History:  Diagnosis Date  . Anxiety   . Candida esophagitis ([place])   . Chronic kidney disease   . DDD (degenerative disc disease)   . Depression   . Erosive gastritis   . Falls   . GERD (gastroesophageal reflux disease)   . Hiatal hernia   . High cholesterol   . History of anal fissures   . Hypertension   . Obesity   . Osteoarthritis   . Panic disorder   . Sleep apnea    Does not need CPAP anymore    Past Surgical History:  Procedure Laterality Date  . ABDOMINAL HYSTERECTOMY  1987   LSO / menorrhagia & leiomyomata  . APPENDECTOMY    . BREAST EXCISIONAL BIOPSY Left 1989   benign  . BREAST SURGERY  1989   benign tumor left breast  . CHOLECYSTECTOMY  2001  . TUBAL LIGATION      There were no vitals filed  for this visit.  Subjective Assessment - 08/25/18 1519    Subjective  PT states that she went to the MD and he said that her balance was due to her inner ear.       Pertinent History  HTN, OA     Patient Stated Goals  no more falling     Currently in Pain?  No/denies         [facility] PT Assessment - 08/25/18 0001      Assessment   Medical Diagnosis  frequent falls    Referring Provider (PT)  Katie Hayatt    Onset Date/Surgical Date  05/19/18    Next MD Visit  08/08/2018    Prior Therapy  none      Precautions   Precautions  Fall      Restrictions   Weight Bearing Restrictions  No      [place] residence    Home Access  Level entry      Prior Function   Level of [place]  Retired    Leisure  walking       Cognition   Overall Cognitive Status  Within Functional Limits for tasks assessed      Functional Tests   Functional tests  Single leg stance;Sit to Stand      Single Leg Stance   Comments  RT: 10 was 4;  LT: 16 was10      Sit to Stand   Comments  5x 10.68 was 16.06      Strength   Right Hip Flexion  5/5    Right Hip Extension  3+/5    Right Hip ABduction  5/5    Left Hip Flexion  5/5    Left Hip Extension  4-/5   was 3/5    Left Hip ABduction  5/5    Right Knee Extension  5/5    Left Knee Extension  5/5    Right Ankle Dorsiflexion  5/5    Left Ankle Dorsiflexion  5/5   was 4/5     Standardized Balance Assessment   Standardized Balance Assessment  Timed Up and Go Test      Timed Up and Go Test   Normal TUG (seconds)  9.81   was 13.95                       Balance Exercises - 08/25/18 1522      Balance Exercises: Standing   Standing Eyes Closed  Narrow base of support (BOS);Foam/compliant surface;3 reps    Tandem Stance  Eyes open;Foam/compliant surface;2 reps    SLS with Vectors  Solid surface;3 reps;10 secs    Tandem Gait  Foam/compliant surface;1 rep     Sidestepping  Foam/compliant support;2 reps    Marching Limitations  on foam x 15           PT Short Term Goals - 08/25/18 1550      PT SHORT TERM GOAL #1   Title  PT gluteal maximus strength to improve to a 4/10 to be able to come sit to stand from low lying couch with confidence    Time  2    Period  Weeks    Status  On-going      PT SHORT TERM GOAL #2   Title  PT to state that she has had no falls; no bouts of dizziness     Time  2    Period  Weeks    Status  Achieved      PT SHORT TERM GOAL #3   Title  Pt to be able to single leg stance bilaterally for at least 20 seconds to reduce risks of falls     Time  2    Period  Weeks    Status  On-going        PT Long Term Goals - 08/25/18 1550      PT LONG TERM GOAL #1   Title  PT gluteal strength to be 4+/5 to allow pt to be able to arise from a squatted postiion to pick items off the floor, work in the yard.     Time  4    Period  Weeks    Status  On-going      PT LONG TERM GOAL #2   Title  PT to have had no falls or sx of dizziness in the past 2 weeks     Time  4    Period  Weeks    Status  Achieved      PT LONG TERM GOAL #3   Title  PT to be able to  single leg stance on both legs for 40 seconds for improve safety while walking on uneven terrain.     Time  4    Period  Weeks    Status  On-going            Plan - 08/25/18 1600    Clinical Impression Statement  Pt reassessed.   Pt feels that she will be able to continue on her own at home and request discharge at this time. PT HEP updated and therapist suggested that pt buy shoes for people who tend to supinate.     Rehab Potential  Good    PT Frequency  2x / week    PT Duration  4 weeks    PT Next Visit Plan  Discharge at this time    PT Home Exercise Plan  bridge, SLS        Patient will benefit from skilled therapeutic intervention in order to improve the following deficits and impairments:  Decreased balance, Decreased strength, Impaired  flexibility, Dizziness  Visit Diagnosis: Frequent falls - Plan: PT plan of care cert/re-cert     Problem List Patient Active Problem List   Diagnosis Date Noted  . Acute right-sided low back pain with right-sided sciatica 06/13/2018  . Hypertension   . High cholesterol   . [place], PT CLT [phone] 08/25/2018, 4:01 PM  [facility] [address] [place], [place], [address] Phone: [phone]   Fax:  [phone]  Name: TENLEE WOLLIN MRN: [id number] Date of Birth: 25-Dec-1940

## 2018-09-01 ENCOUNTER — Ambulatory Visit: Payer: Medicare Other | Admitting: Neurology

## 2018-09-07 DIAGNOSIS — R350 Frequency of micturition: Secondary | ICD-10-CM | POA: Diagnosis not present

## 2018-09-29 DIAGNOSIS — B078 Other viral warts: Secondary | ICD-10-CM | POA: Diagnosis not present

## 2018-10-14 DIAGNOSIS — J181 Lobar pneumonia, unspecified organism: Secondary | ICD-10-CM | POA: Diagnosis not present

## 2018-10-14 DIAGNOSIS — R05 Cough: Secondary | ICD-10-CM | POA: Diagnosis not present

## 2018-10-14 DIAGNOSIS — Z6834 Body mass index (BMI) 34.0-34.9, adult: Secondary | ICD-10-CM | POA: Diagnosis not present

## 2018-10-26 ENCOUNTER — Ambulatory Visit (INDEPENDENT_AMBULATORY_CARE_PROVIDER_SITE_OTHER): Payer: Medicare Other

## 2018-10-26 ENCOUNTER — Ambulatory Visit (INDEPENDENT_AMBULATORY_CARE_PROVIDER_SITE_OTHER): Payer: Medicare Other | Admitting: Physician Assistant

## 2018-10-26 ENCOUNTER — Encounter (INDEPENDENT_AMBULATORY_CARE_PROVIDER_SITE_OTHER): Payer: Self-pay | Admitting: Physician Assistant

## 2018-10-26 DIAGNOSIS — M25562 Pain in left knee: Secondary | ICD-10-CM | POA: Diagnosis not present

## 2018-10-26 MED ORDER — METHYLPREDNISOLONE ACETATE 40 MG/ML IJ SUSP
40.0000 mg | INTRAMUSCULAR | Status: AC | PRN
  Administered 2018-10-26: 40 mg via INTRA_ARTICULAR

## 2018-10-26 MED ORDER — LIDOCAINE HCL 1 % IJ SOLN
3.0000 mL | INTRAMUSCULAR | Status: AC | PRN
  Administered 2018-10-26: 3 mL

## 2018-10-26 NOTE — Progress Notes (Signed)
Office Visit Note   Patient: Linda [name]           Date of Birth: 1941-01-05           MRN: [id number] Visit Date: 10/26/2018              Requested by: Crist Infante, MD [address] [place], [place] [address] PCP: Crist Infante, MD   Assessment & Plan: Visit Diagnoses:  1. Acute pain of left knee     Plan: We will obtain a Doppler of her left lower leg to rule out DVT.  See her back in 2 weeks to see what type of response she had regards to the left knee injection which she tolerated well today.  Discussed quad strengthening exercises with her at length.  She is to be mindful of any mechanical symptoms she is having of the knee over the next 2 weeks.  Given an open patella knee sleeve which she can wear during the day.  Follow-Up Instructions: Return in about 2 weeks (around 11/09/2018).   Orders:  Orders Placed This Encounter  Procedures  . XR KNEE 3 VIEW LEFT   No orders of the defined types were placed in this encounter.     Procedures: Large Joint Inj: L knee on 10/26/2018 5:33 PM Indications: pain Details: 22 G 1.5 in needle, superolateral approach  Arthrogram: No  Medications: 3 mL lidocaine 1 %; 40 mg methylPREDNISolone acetate 40 MG/ML Aspirate: 5 mL serous Outcome: tolerated well, no immediate complications Procedure, treatment alternatives, risks and benefits explained, specific risks discussed. Consent was given by the patient. Immediately prior to procedure a time out was called to verify the correct patient, procedure, equipment, support staff and site/side marked as required. Patient was prepped and draped in the usual sterile fashion.       Clinical Data: No additional findings.   Subjective: Chief Complaint  Patient presents with  . Left Knee - Pain    HPI [name] comes in today with new complaint of left knee pain.  Pain started about a week ago.  States that she feels like the knee did get locked about a week ago when she had  difficulty bearing weight on it.  Since then she has had some painful popping in the knee.  No giving way of the knee.  Describes pain as a dull pain especially with bending the knee.  She notes swelling in both legs but more so on the left leg.  She has had no shortness of breath chest pain.  No history of DVT.  She notes the left knee pops whenever she walks.  She is tried ice Aleve and Tylenol which all helps some.  She denies any particular injury to the knee.  Review of Systems Please see HPI  Objective: Vital Signs: There were no vitals taken for this visit.  Physical Exam Constitutional:      Appearance: She is not ill-appearing or diaphoretic.  Pulmonary:     Effort: Pulmonary effort is normal.  Neurological:     Mental Status: She is alert and oriented to person, place, and time.  Psychiatric:        Mood and Affect: Mood normal.     Ortho Exam Bilateral calves slight tenderness bilaterally left greater than right.  She has +1 edema of the left lower leg.  Bilateral knees good range of motion.  Tenderness along medial joint line left knee.  No instability valgus varus stressing of either knee.  Left knee with slight edema compared to the right and possible small effusion. Specialty Comments:  No specialty comments available.  Imaging: Xr Knee 3 View Left  Result Date: 10/26/2018 Left knee 3 views: Mild narrowing medial joint line moderate patellofemoral changes.  No acute fractures.    PMFS History: Patient Active Problem List   Diagnosis Date Noted  . Acute right-sided low back pain with right-sided sciatica 06/13/2018  . Hypertension   . High cholesterol   . Anxiety    Past Medical History:  Diagnosis Date  . Anxiety   . Candida esophagitis ([place])   . Chronic kidney disease   . DDD (degenerative disc disease)   . Depression   . Erosive gastritis   . Falls   . GERD (gastroesophageal reflux disease)   . Hiatal hernia   . High cholesterol   . History of anal  fissures   . Hypertension   . Obesity   . Osteoarthritis   . Panic disorder   . Sleep apnea    Does not need CPAP anymore    Family History  Problem Relation Age of Onset  . Heart disease Mother   . Lung cancer Father   . Diabetes Sister   . Hypertension Sister   . Diabetes Brother   . Heart disease Brother   . Ovarian cancer Sister   . Heart disease Brother   . Heart disease Brother   . Colon cancer Neg Hx     Past Surgical History:  Procedure Laterality Date  . ABDOMINAL HYSTERECTOMY  1987   LSO / menorrhagia & leiomyomata  . APPENDECTOMY    . BREAST EXCISIONAL BIOPSY Left 1989   benign  . BREAST SURGERY  1989   benign tumor left breast  . CHOLECYSTECTOMY  2001  . TUBAL LIGATION     Social History   Occupational History  . Occupation: Retired   Tobacco Use  . Smoking status: Never Smoker  . Smokeless tobacco: Never Used  Substance and Sexual Activity  . Alcohol use: No  . Drug use: No  . Sexual activity: Yes

## 2018-10-27 ENCOUNTER — Other Ambulatory Visit (INDEPENDENT_AMBULATORY_CARE_PROVIDER_SITE_OTHER): Payer: Self-pay

## 2018-10-27 ENCOUNTER — Telehealth (INDEPENDENT_AMBULATORY_CARE_PROVIDER_SITE_OTHER): Payer: Self-pay | Admitting: *Deleted

## 2018-10-27 DIAGNOSIS — M25562 Pain in left knee: Secondary | ICD-10-CM

## 2018-10-27 DIAGNOSIS — M25462 Effusion, left knee: Secondary | ICD-10-CM

## 2018-10-27 NOTE — Addendum Note (Signed)
Addended by: Daylene Posey T on: 10/27/2018 02:10 PM   Modules accepted: Orders

## 2018-10-27 NOTE — Telephone Encounter (Signed)
Pt has appt scheudled for Ultrasound to r/o DVT on Friday Jan 10 at 9am, IC and lvm for pt to rc.

## 2018-10-28 ENCOUNTER — Ambulatory Visit (HOSPITAL_COMMUNITY): Admission: RE | Admit: 2018-10-28 | Payer: Medicare Other | Source: Ambulatory Visit

## 2018-10-28 NOTE — Telephone Encounter (Signed)
LMTRC to pt for appt scheduled for today

## 2018-10-28 NOTE — Telephone Encounter (Signed)
Pt rescheduled her appt for next Friday Jan 17

## 2018-11-04 ENCOUNTER — Ambulatory Visit (HOSPITAL_COMMUNITY)
Admission: RE | Admit: 2018-11-04 | Discharge: 2018-11-04 | Disposition: A | Discharge status: Home / Self Care | Payer: Medicare Other | Source: Ambulatory Visit | Attending: Orthopaedic Surgery | Admitting: Orthopaedic Surgery

## 2018-11-04 DIAGNOSIS — M25562 Pain in left knee: Secondary | ICD-10-CM | POA: Insufficient documentation

## 2018-11-04 DIAGNOSIS — M25462 Effusion, left knee: Secondary | ICD-10-CM | POA: Insufficient documentation

## 2018-11-04 NOTE — Progress Notes (Signed)
LLE venous duplex       has been completed. Preliminary results can be found under CV proc through chart review. June Leap, BS, RDMS, RVT   Called results to Dr. Ninfa Linden

## 2018-11-09 ENCOUNTER — Ambulatory Visit (INDEPENDENT_AMBULATORY_CARE_PROVIDER_SITE_OTHER): Payer: Medicare Other | Admitting: Physician Assistant

## 2018-11-09 ENCOUNTER — Encounter (INDEPENDENT_AMBULATORY_CARE_PROVIDER_SITE_OTHER): Payer: Self-pay | Admitting: Physician Assistant

## 2018-11-09 VITALS — Ht 61.0 in | Wt 179.0 lb

## 2018-11-09 DIAGNOSIS — M25562 Pain in left knee: Secondary | ICD-10-CM

## 2018-11-09 NOTE — Progress Notes (Signed)
HPI: [name] returns today for follow-up of her left knee status post cortisone injection.  She states that overall her knee is doing much better.  Some soreness occasionally but she states this is very occasional no mechanical symptoms. Left knee good range of motion without pain no tenderness on medial lateral joint line. We will see her back on an as-needed basis.  No charge for today's office visit less than 2 to 3 minutes was spent with patient is mainly talking to her.  Discussed with her that she could have a cortisone injection no more often than every 3 months for the left knee.Marland Kitchen

## 2018-11-24 DIAGNOSIS — J181 Lobar pneumonia, unspecified organism: Secondary | ICD-10-CM | POA: Diagnosis not present

## 2018-11-30 DIAGNOSIS — H524 Presbyopia: Secondary | ICD-10-CM | POA: Diagnosis not present

## 2018-11-30 DIAGNOSIS — H33303 Unspecified retinal break, bilateral: Secondary | ICD-10-CM | POA: Diagnosis not present

## 2018-11-30 DIAGNOSIS — H5201 Hypermetropia, right eye: Secondary | ICD-10-CM | POA: Diagnosis not present

## 2018-11-30 DIAGNOSIS — H25813 Combined forms of age-related cataract, bilateral: Secondary | ICD-10-CM | POA: Diagnosis not present

## 2018-11-30 DIAGNOSIS — H3540 Unspecified peripheral retinal degeneration: Secondary | ICD-10-CM | POA: Diagnosis not present

## 2018-11-30 DIAGNOSIS — H5212 Myopia, left eye: Secondary | ICD-10-CM | POA: Diagnosis not present

## 2018-11-30 DIAGNOSIS — H3589 Other specified retinal disorders: Secondary | ICD-10-CM | POA: Diagnosis not present

## 2018-12-20 DIAGNOSIS — N390 Urinary tract infection, site not specified: Secondary | ICD-10-CM | POA: Diagnosis not present

## 2018-12-20 DIAGNOSIS — R3129 Other microscopic hematuria: Secondary | ICD-10-CM | POA: Diagnosis not present

## 2019-02-06 DIAGNOSIS — R0789 Other chest pain: Secondary | ICD-10-CM | POA: Diagnosis not present

## 2019-02-06 DIAGNOSIS — F339 Major depressive disorder, recurrent, unspecified: Secondary | ICD-10-CM | POA: Diagnosis not present

## 2019-02-06 DIAGNOSIS — I10 Essential (primary) hypertension: Secondary | ICD-10-CM | POA: Diagnosis not present

## 2019-02-06 DIAGNOSIS — G4733 Obstructive sleep apnea (adult) (pediatric): Secondary | ICD-10-CM | POA: Diagnosis not present

## 2019-02-06 DIAGNOSIS — R42 Dizziness and giddiness: Secondary | ICD-10-CM | POA: Diagnosis not present

## 2019-02-06 DIAGNOSIS — R600 Localized edema: Secondary | ICD-10-CM | POA: Diagnosis not present

## 2019-02-06 DIAGNOSIS — R296 Repeated falls: Secondary | ICD-10-CM | POA: Diagnosis not present

## 2019-02-17 ENCOUNTER — Other Ambulatory Visit: Payer: Self-pay

## 2019-02-17 ENCOUNTER — Other Ambulatory Visit: Payer: Self-pay | Admitting: Adult Health

## 2019-02-17 ENCOUNTER — Ambulatory Visit
Admission: RE | Admit: 2019-02-17 | Discharge: 2019-02-17 | Disposition: A | Discharge status: Home / Self Care | Payer: Medicare Other | Source: Ambulatory Visit | Attending: Adult Health | Admitting: Adult Health

## 2019-02-17 DIAGNOSIS — M25561 Pain in right knee: Secondary | ICD-10-CM | POA: Insufficient documentation

## 2019-02-17 DIAGNOSIS — W19XXXA Unspecified fall, initial encounter: Secondary | ICD-10-CM

## 2019-02-17 DIAGNOSIS — M25461 Effusion, right knee: Secondary | ICD-10-CM | POA: Diagnosis not present

## 2019-03-21 ENCOUNTER — Ambulatory Visit: Payer: Medicare Other | Admitting: Orthopaedic Surgery

## 2019-03-30 DIAGNOSIS — L821 Other seborrheic keratosis: Secondary | ICD-10-CM | POA: Diagnosis not present

## 2019-03-30 DIAGNOSIS — L820 Inflamed seborrheic keratosis: Secondary | ICD-10-CM | POA: Diagnosis not present

## 2019-04-03 ENCOUNTER — Telehealth: Payer: Self-pay | Admitting: Gastroenterology

## 2019-04-03 MED ORDER — FAMOTIDINE 20 MG PO TABS: 20.0000 mg | ORAL_TABLET | Freq: Every day | ORAL | 1 refills | Status: DC

## 2019-04-03 NOTE — Telephone Encounter (Signed)
Pt reported that she has stopped taking ranitidine and requested an alt to be prescribed.

## 2019-04-03 NOTE — Telephone Encounter (Signed)
Patient states she has been on ranitidine 150 mg at bedtime and just recently stopped since cancer warning. Informed patient we will switch her to famotidine 20 mg at bedtime in it's place. Prescription sent to the pharmacy and patient notified. Patient verbalized understanding.

## 2019-04-20 DIAGNOSIS — L308 Other specified dermatitis: Secondary | ICD-10-CM | POA: Diagnosis not present

## 2019-04-24 DIAGNOSIS — H3540 Unspecified peripheral retinal degeneration: Secondary | ICD-10-CM | POA: Diagnosis not present

## 2019-05-01 ENCOUNTER — Other Ambulatory Visit: Payer: Self-pay | Admitting: Internal Medicine

## 2019-05-01 DIAGNOSIS — Z1231 Encounter for screening mammogram for malignant neoplasm of breast: Secondary | ICD-10-CM

## 2019-05-22 ENCOUNTER — Other Ambulatory Visit: Payer: Self-pay | Admitting: Gastroenterology

## 2019-05-25 DIAGNOSIS — I872 Venous insufficiency (chronic) (peripheral): Secondary | ICD-10-CM | POA: Diagnosis not present

## 2019-06-15 ENCOUNTER — Ambulatory Visit
Admission: RE | Admit: 2019-06-15 | Discharge: 2019-06-15 | Disposition: A | Discharge status: Home / Self Care | Payer: Medicare Other | Source: Ambulatory Visit | Attending: Internal Medicine | Admitting: Internal Medicine

## 2019-06-15 ENCOUNTER — Other Ambulatory Visit: Payer: Self-pay

## 2019-06-15 DIAGNOSIS — Z1231 Encounter for screening mammogram for malignant neoplasm of breast: Secondary | ICD-10-CM | POA: Diagnosis not present

## 2019-07-07 DIAGNOSIS — Z23 Encounter for immunization: Secondary | ICD-10-CM | POA: Diagnosis not present

## 2019-07-11 ENCOUNTER — Ambulatory Visit: Payer: Medicare Other | Admitting: Gastroenterology

## 2019-07-13 ENCOUNTER — Encounter: Payer: Self-pay | Admitting: Gastroenterology

## 2019-07-13 ENCOUNTER — Ambulatory Visit (INDEPENDENT_AMBULATORY_CARE_PROVIDER_SITE_OTHER): Payer: Medicare Other | Admitting: Gastroenterology

## 2019-07-13 VITALS — BP 100/60 | HR 53 | Temp 98.8°F | Ht 61.0 in | Wt 181.0 lb

## 2019-07-13 DIAGNOSIS — K5904 Chronic idiopathic constipation: Secondary | ICD-10-CM | POA: Diagnosis not present

## 2019-07-13 DIAGNOSIS — K219 Gastro-esophageal reflux disease without esophagitis: Secondary | ICD-10-CM | POA: Diagnosis not present

## 2019-07-13 MED ORDER — FAMOTIDINE 20 MG PO TABS: ORAL_TABLET | ORAL | 11 refills | Status: DC

## 2019-07-13 MED ORDER — DEXILANT 60 MG PO CPDR: 60.0000 mg | DELAYED_RELEASE_CAPSULE | Freq: Every day | ORAL | 11 refills | Status: DC

## 2019-07-13 NOTE — Progress Notes (Signed)
    History of Present Illness: This is a 78 year old female returning for follow-up of GERD.  She relates her reflux symptoms are well controlled.  She does have ongoing problems with sinus drainage that have improved recently.  She has ongoing difficulties with constipation with associated abdominal bloating.  Current Medications, Allergies, Past Medical History, Past Surgical History, Family History and Social History were reviewed in [employer] record.   Physical Exam: General: Well developed, well nourished, no acute distress Head: Normocephalic and atraumatic Eyes:  sclerae anicteric, EOMI Ears: Normal auditory acuity Mouth: No deformity or lesions Lungs: Clear throughout to auscultation Heart: Regular rate and rhythm; no murmurs, rubs or bruits Abdomen: Soft, non tender and non distended. No masses, hepatosplenomegaly or hernias noted. Normal Bowel sounds Rectal: Not done Musculoskeletal: Symmetrical with no gross deformities  Pulses:  Normal pulses noted Extremities: No clubbing, cyanosis, edema or deformities noted Neurological: Alert oriented x 4, grossly nonfocal Psychological:  Alert and cooperative. Normal mood and affect   Assessment and Recommendations:  1.  GERD, well controlled.  Continue Dexilant 60 mg p.o. every morning and famotidine 20 mg p.o. nightly.  Follow antireflux measures. REV in 1 year.  2.  Dyspepsia, well controlled.  Continue FDgard 2 p.o. twice daily  3.  Constipation.  Increase dietary fiber and daily fluid intake.  Begin MiraLAX once daily.  4.  Personal history of adenomatous colon polyps.  One small tubular adenoma on colonoscopy in 2016.  7-year interval surveillance colonoscopy is recommended in August 2023.

## 2019-07-13 NOTE — Patient Instructions (Signed)
We have sent the following medications to your pharmacy for you to pick up at your convenience: Dexilant and famotidine.   Take over the counter Miralax 17 grams in 8 oz of water daily for constipation.   Thank you for choosing me and [facility].  Pricilla Riffle. Dagoberto Ligas., MD., Marval Regal

## 2019-09-18 DIAGNOSIS — E7849 Other hyperlipidemia: Secondary | ICD-10-CM | POA: Diagnosis not present

## 2019-09-25 DIAGNOSIS — R001 Bradycardia, unspecified: Secondary | ICD-10-CM | POA: Diagnosis not present

## 2019-09-25 DIAGNOSIS — I739 Peripheral vascular disease, unspecified: Secondary | ICD-10-CM | POA: Diagnosis not present

## 2019-09-25 DIAGNOSIS — F339 Major depressive disorder, recurrent, unspecified: Secondary | ICD-10-CM | POA: Diagnosis not present

## 2019-09-25 DIAGNOSIS — R296 Repeated falls: Secondary | ICD-10-CM | POA: Diagnosis not present

## 2019-09-25 DIAGNOSIS — R0789 Other chest pain: Secondary | ICD-10-CM | POA: Diagnosis not present

## 2019-09-25 DIAGNOSIS — K5909 Other constipation: Secondary | ICD-10-CM | POA: Diagnosis not present

## 2019-09-25 DIAGNOSIS — R000 Tachycardia, unspecified: Secondary | ICD-10-CM | POA: Diagnosis not present

## 2019-09-25 DIAGNOSIS — M999 Biomechanical lesion, unspecified: Secondary | ICD-10-CM | POA: Diagnosis not present

## 2019-09-25 DIAGNOSIS — R011 Cardiac murmur, unspecified: Secondary | ICD-10-CM | POA: Diagnosis not present

## 2019-09-25 DIAGNOSIS — N183 Chronic kidney disease, stage 3 unspecified: Secondary | ICD-10-CM | POA: Diagnosis not present

## 2019-09-25 DIAGNOSIS — Z0000 Encounter for general adult medical examination without abnormal findings: Secondary | ICD-10-CM | POA: Diagnosis not present

## 2019-09-25 DIAGNOSIS — R531 Weakness: Secondary | ICD-10-CM | POA: Diagnosis not present

## 2019-09-25 DIAGNOSIS — Z1331 Encounter for screening for depression: Secondary | ICD-10-CM | POA: Diagnosis not present

## 2019-09-26 ENCOUNTER — Telehealth: Payer: Self-pay | Admitting: *Deleted

## 2019-09-26 NOTE — Telephone Encounter (Signed)
A message was left, re: her new patient appointment. 

## 2019-10-18 ENCOUNTER — Telehealth: Payer: Self-pay | Admitting: Internal Medicine

## 2019-10-18 NOTE — Telephone Encounter (Signed)
New message:  Patient call to ask if her husband can come with her, because it is her first appt. Please call patient.

## 2019-10-18 NOTE — Telephone Encounter (Signed)
Pt notified d/t current COVID restrictions husband is unable to come with her to appointment. Verbalizes understanding he will wait in the hallway

## 2019-10-21 ENCOUNTER — Other Ambulatory Visit: Payer: Self-pay | Admitting: Gastroenterology

## 2019-10-22 NOTE — Progress Notes (Signed)
Cardiology Office Note:    Date:  10/23/2019   ID:  Linda [name], DOB Sep 27, 1941, MRN [id number]  PCP:  Crist Infante, MD  Cardiologist:  No primary care provider on file.  Electrophysiologist:  None   Referring MD: Crist Infante, MD   Chief Complaint: Murmur, atypical chest pain, cardiac risk factors  History of Present Illness:    Linda [name] is a 79 y.o. female with a hx of obesity, hyperlipidemia, hypertension, GERD, OSA not requiring treatment given weight loss, frequent falls.  She tells me that she experiences a lot of GERD and frequently has indigestion.  She also suffers with chronic nausea and takes Zofran.  She presents for substernal chest pain that occurs slightly less than monthly, and can last approximately 20 minutes at a time.  Last episode 2 to 3 months ago.  For the last episode she took Tylenol which was mildly helpful.  She has been noted by her primary care physician that she has a murmur on exam.  She also has been noted to have dizziness in the past.  She has been noted to have tachycardia but this seems to be primarily with anxiety and panic attacks, and bradycardia Bystolic 5 mg daily.  She has bilateral lower extremity edema which she has been managing with her primary care physician.  In addition bradycardia has been noted.  Strong family history of coronary artery disease, she has 3 brothers who have had MIs in their 86s and passed, in addition her mother passed at age 25 from [place].  She experiences frequent falls.  She has hyperlipidemia and LDL is not optimized.  She pursued aggressive lifestyle modification and had excellent weight loss of 66 pounds in 2008-2010, however over time it has slowly come back.  No sx of sleep apnea.   Labs from September 25, 2019 Hemoglobin 13.8 g/dL, platelets 253,000, potassium 4.6 creatinine 1, GFR 53.9, TSH 2.07. Total cholesterol 202, triglycerides 141, HDL 49, LDL 125 apolipoprotein B 104.  Past Medical History:    Diagnosis Date  . Anxiety   . Candida esophagitis ([place])   . Chronic kidney disease   . DDD (degenerative disc disease)   . Depression   . Erosive gastritis   . Falls   . GERD (gastroesophageal reflux disease)   . Hiatal hernia   . High cholesterol   . History of anal fissures   . Hypertension   . Obesity   . Osteoarthritis   . Panic disorder   . Sleep apnea    Does not need CPAP anymore    Past Surgical History:  Procedure Laterality Date  . ABDOMINAL HYSTERECTOMY  1987   LSO / menorrhagia & leiomyomata  . APPENDECTOMY    . BREAST EXCISIONAL BIOPSY Left 1989   benign  . BREAST SURGERY  1989   benign tumor left breast  . CHOLECYSTECTOMY  2001  . TUBAL LIGATION      Current Medications: Current Meds  Medication Sig  . ALPRAZolam (XANAX) 1 MG tablet Take 0.5-1 mg by mouth 3 (three) times daily as needed for anxiety.   . AMBULATORY NON FORMULARY MEDICATION FDGARD 2 capsules by mouth twice a day  . amLODipine (NORVASC) 10 MG tablet Take 10 mg by mouth daily.   Marland Kitchen aspirin 81 MG tablet Take 81 mg by mouth daily.    . benazepril (LOTENSIN) 40 MG tablet Take 40 mg by mouth daily.    Marland Kitchen BIOTIN PO Take 1 tablet by mouth daily.  Marland Kitchen  BYSTOLIC 5 MG tablet Take 5 mg by mouth daily.  Marland Kitchen desvenlafaxine (PRISTIQ) 100 MG 24 hr tablet Take 100 mg by mouth daily.   Marland Kitchen dexlansoprazole (DEXILANT) 60 MG capsule Take 1 capsule (60 mg total) by mouth daily.  . famotidine (SM ACID REDUCER MAX ST) 20 MG tablet TAKE ONE TABLET (20MG  TOTAL) BY MOUTH ATBEDTIME  . furosemide (LASIX) 20 MG tablet Take 20 mg by mouth as needed.  . lamoTRIgine (LAMICTAL) 200 MG tablet Take 200 mg by mouth 2 (two) times daily.   Marland Kitchen loratadine (CLARITIN) 10 MG tablet Take 10 mg by mouth daily.  . meclizine (ANTIVERT) 25 MG tablet Take 25 mg by mouth 3 (three) times daily as needed for dizziness.  . ondansetron (ZOFRAN) 8 MG tablet Take 1 tablet (8 mg total) by mouth every 8 (eight) hours as needed for nausea.  . potassium  chloride (KLOR-CON) 20 MEQ packet Take 20 mEq by mouth daily.  . simvastatin (ZOCOR) 20 MG tablet   . [DISCONTINUED] famotidine (HM FAMOTIDINE) 20 MG tablet TAKE ONE TABLET (20MG  TOTAL) BY MOUTH ATBEDTIME   Current Facility-Administered Medications for the 10/23/19 encounter (Office Visit) with Elouise Munroe, MD  Medication  . 0.9 %  sodium chloride infusion     Allergies:   Amoxicillin and Abilify [aripiprazole]   Social History   Socioeconomic History  . Marital status: Married    Spouse name: Not on file  . Number of children: 1  . Years of education: Not on file  . Highest education level: Not on file  Occupational History  . Occupation: Retired   Tobacco Use  . Smoking status: Never Smoker  . Smokeless tobacco: Never Used  Substance and Sexual Activity  . Alcohol use: No  . Drug use: No  . Sexual activity: Yes  Other Topics Concern  . Not on file  Social History Narrative   Daily caffeine    Social Determinants of Health   Financial Resource Strain:   . Difficulty of Paying Living Expenses: Not on file  Food Insecurity:   . Worried About Charity fundraiser in the Last Year: Not on file  . Ran Out of Food in the Last Year: Not on file  Transportation Needs:   . Lack of Transportation (Medical): Not on file  . Lack of Transportation (Non-Medical): Not on file  Physical Activity:   . Days of Exercise per Week: Not on file  . Minutes of Exercise per Session: Not on file  Stress:   . Feeling of Stress : Not on file  Social Connections:   . Frequency of Communication with Friends and Family: Not on file  . Frequency of Social Gatherings with Friends and Family: Not on file  . Attends Religious Services: Not on file  . Active Member of Clubs or Organizations: Not on file  . Attends Archivist Meetings: Not on file  . Marital Status: Not on file     Family History: The patient's family history includes Diabetes in her brother and sister; Heart  attack in her brother, brother, and brother; Heart disease in her brother, brother, brother, and mother; Hypertension in her sister; Lung cancer in her father; Ovarian cancer in her sister. There is no history of Colon cancer.  ROS:   Please see the history of present illness.    All other systems reviewed and are negative.  EKGs/Labs/Other Studies Reviewed:    The following studies were reviewed today:  EKG: Sinus bradycardia  rate 49 with sinus arrhythmia, left axis deviation, right bundle branch block, QRS duration 146 ms. ECG 05/22/2018-no significant change, sinus rhythm right bundle branch block, heart rate 50  Recent Labs: 10/30/2019: BUN 16; Creatinine, Ser 1.03; Potassium 4.7; Sodium 141  Recent Lipid Panel No results found for: CHOL, TRIG, HDL, CHOLHDL, VLDL, LDLCALC, LDLDIRECT  Physical Exam:    VS:  BP (!) 157/57   Pulse (!) 49   Temp (!) 97.5 F (36.4 C)   Ht 5\' 1"  (1.549 m)   Wt 182 lb (82.6 kg)   BMI 34.39 kg/m     Wt Readings from Last 5 Encounters:  11/08/19 177 lb 9.6 oz (80.6 kg)  10/23/19 182 lb (82.6 kg)  07/13/19 181 lb (82.1 kg)  11/09/18 179 lb (81.2 kg)  06/23/18 179 lb 2 oz (81.3 kg)     Constitutional: No acute distress Eyes: sclera non-icteric, normal conjunctiva and lids ENMT: normal dentition, moist mucous membranes Cardiovascular: regular rhythm, bradycardic rate, 2/6 sem murmurs. S1 and S2 normal. Radial pulses normal bilaterally. No jugular venous distention.  Respiratory: clear to auscultation bilaterally GI : normal bowel sounds, soft and nontender. No distention.   MSK: extremities warm, well perfused.  Trace bilateral edema.  NEURO: grossly nonfocal exam, moves all extremities. PSYCH: alert and oriented x 3, normal mood and affect.   ASSESSMENT:    1. Precordial chest pain   2. Chest pain, unspecified type   3. Hypertension, unspecified type   4. Medication management   5. Hyperlipidemia, unspecified hyperlipidemia type   6.  Family history of premature CAD    PLAN:    Substernal chest pain-some atypical features, however with her strong family history of premature CAD, hypertension, hyperlipidemia, and overweight risk factors, we will need to rule out ischemia and obstructive CAD.  I will obtain a CT coronary angiogram given her optimized heart rate, I believe we will get very good results.  We will also perform an echocardiogram to rule out structural causes.  Systolic murmur-we will evaluate with an echocardiogram, which we are obtaining for chest pain.  Hypertension-blood pressure not optimized today and not significantly different than home readings.  We will initiate spironolactone 25 milligrams daily with bmet 1 week later.  Bradycardia-overall asymptomatic at this time, continue to monitor.  Continue Bystolic.  BMI 33.5-continue aggressive lifestyle modification, patient is trying to lose weight.  Hyperlipidemia-LDL not optimized and strong family history of heart disease and personal risk factors, she will need high intensity statin therapy.  F/u with me in 2 weeks to evaluate blood pressure.  Total time of encounter: 45 minutes total time of encounter, including 25 minutes spent in face-to-face patient care. This time includes coordination of care and counseling regarding risk factor modification and the above-mentioned issues. Remainder of non-face-to-face time involved reviewing chart documents/testing relevant to the patient encounter and documentation in the medical record.  Cherlynn Kaiser, MD [place]  [facility]   Medication Adjustments/Labs and Tests Ordered: Current medicines are reviewed at length with the patient today.  Concerns regarding medicines are outlined above.  Orders Placed This Encounter  Procedures  . CT CORONARY MORPH W/CTA COR W/SCORE W/CA W/CM &/OR WO/CM  . CT CORONARY FRACTIONAL FLOW RESERVE DATA PREP  . CT CORONARY FRACTIONAL FLOW RESERVE FLUID ANALYSIS  . BMET   . EKG 12-Lead  . ECHOCARDIOGRAM COMPLETE   Meds ordered this encounter  Medications  . spironolactone (ALDACTONE) 25 MG tablet    Sig: Take 1 tablet (  25 mg total) by mouth daily.    Dispense:  90 tablet    Refill:  3    Patient Instructions  Medication Instructions:  Your physician has recommended you make the following change in your medication:   START SPIRONOLACTONE 25 MG. ONE TABLET BY MOUTH daily  *If you need a refill on your cardiac medications before your next appointment, please call your pharmacy*  Lab Work: Your physician recommends that you return for lab work in 1 WEEK:  [place]  If you have labs (blood work) drawn today and your tests are completely normal, you will receive your results only by: Marland Kitchen MyChart Message (if you have MyChart) OR . A paper copy in the mail If you have any lab test that is abnormal or we need to change your treatment, we will call you to review the results.  Testing/Procedures: Your physician has requested that you have an echocardiogram. Echocardiography is a painless test that uses sound waves to create images of your heart. It provides your doctor with information about the size and shape of your heart and how well your heart's chambers and valves are working. This procedure takes approximately one hour. There are no restrictions for this procedure. LOCATION:  [facility]: [place], [place], [place] [address]  Your physician has requested that you have cardiac CT. Cardiac computed tomography (CT) is a painless test that uses an x-ray machine to take clear, detailed pictures of your heart. For further information please visit HugeFiesta.[email or web address]. Please follow instruction sheet as given.    Follow-Up: At [facility], you and your health needs are our priority.  As part of our continuing mission to provide you with exceptional heart care, we have created designated  Provider Care Teams.  These Care Teams include your primary Cardiologist (physician) and Advanced Practice Providers (APPs -  Physician Assistants and Nurse Practitioners) who all work together to provide you with the care you need, when you need it.  Your next appointment:   11/08/2019 OR 11/09/2019  The format for your next appointment:   In Person  Provider:   Cherlynn Kaiser, MD  _____________________________________________________________  Your cardiac CT will be scheduled at one of the below locations:   [facility] [address] [place], [place] [address] [phone]  [place] [address] [place],  [address] [phone]  If scheduled at [facility], please arrive at the [facility] main entrance of [facility] 30-45 minutes prior to test start time. Proceed to the [facility] Radiology Department (first floor) to check-in and test prep.  If scheduled at [facility], please arrive 15 mins early for check-in and test prep.  Please follow these instructions carefully (unless otherwise directed):   On the Night Before the Test: . Be sure to Drink plenty of water. . Do not consume any caffeinated/decaffeinated beverages or chocolate 12 hours prior to your test. . Do not take any antihistamines 12 hours prior to your test.   On the Day of the Test: . Drink plenty of water. Do not drink any water within one hour of the test. . Do not eat any food 4 hours prior to the test. . You may take your regular medications prior to the test.  . HOLD Furosemide/Hydrochlorothiazide/spironolactone morning of the test. . FEMALES- please wear underwire-free bra if available  After the Test: . Drink plenty of water. . After receiving IV contrast, you may experience a mild flushed feeling. This is normal. . On occasion, you may experience a mild rash up to 24  hours after the test. This is not dangerous. If this occurs, you can take Benadryl 25 mg and increase your fluid intake. . If you experience trouble breathing, this can be serious. If it is severe call 911 IMMEDIATELY. If it is mild, please call our office. . If you take any of these medications: Glipizide/Metformin, Avandament, Glucavance, please do not take 48 hours after completing test unless otherwise instructed.   Once we have confirmed authorization from your insurance company, we will call you to set up a date and time for your test.   For non-scheduling related questions, please contact the cardiac imaging nurse navigator should you have any questions/concerns: Marchia Bond, RN Navigator Cardiac Imaging [facility] [phone] Office

## 2019-10-23 ENCOUNTER — Ambulatory Visit (INDEPENDENT_AMBULATORY_CARE_PROVIDER_SITE_OTHER): Payer: Medicare Other | Admitting: Internal Medicine

## 2019-10-23 ENCOUNTER — Encounter (INDEPENDENT_AMBULATORY_CARE_PROVIDER_SITE_OTHER): Payer: Self-pay

## 2019-10-23 ENCOUNTER — Encounter: Payer: Self-pay | Admitting: Internal Medicine

## 2019-10-23 ENCOUNTER — Other Ambulatory Visit: Payer: Self-pay

## 2019-10-23 VITALS — BP 157/57 | HR 49 | Temp 97.5°F | Ht 61.0 in | Wt 182.0 lb

## 2019-10-23 DIAGNOSIS — Z8249 Family history of ischemic heart disease and other diseases of the circulatory system: Secondary | ICD-10-CM

## 2019-10-23 DIAGNOSIS — E785 Hyperlipidemia, unspecified: Secondary | ICD-10-CM

## 2019-10-23 DIAGNOSIS — R072 Precordial pain: Secondary | ICD-10-CM | POA: Diagnosis not present

## 2019-10-23 DIAGNOSIS — Z79899 Other long term (current) drug therapy: Secondary | ICD-10-CM | POA: Diagnosis not present

## 2019-10-23 DIAGNOSIS — R079 Chest pain, unspecified: Secondary | ICD-10-CM

## 2019-10-23 DIAGNOSIS — I10 Essential (primary) hypertension: Secondary | ICD-10-CM | POA: Diagnosis not present

## 2019-10-23 MED ORDER — SPIRONOLACTONE 25 MG PO TABS: 25.0000 mg | ORAL_TABLET | Freq: Every day | ORAL | 3 refills | Status: DC

## 2019-10-23 NOTE — Patient Instructions (Addendum)
Medication Instructions:  Your physician has recommended you make the following change in your medication:   START SPIRONOLACTONE 25 MG. ONE TABLET BY MOUTH daily  *If you need a refill on your cardiac medications before your next appointment, please call your pharmacy*  Lab Work: Your physician recommends that you return for lab work in 1 WEEK:  [place]  If you have labs (blood work) drawn today and your tests are completely normal, you will receive your results only by: Marland Kitchen MyChart Message (if you have MyChart) OR . A paper copy in the mail If you have any lab test that is abnormal or we need to change your treatment, we will call you to review the results.  Testing/Procedures: Your physician has requested that you have an echocardiogram. Echocardiography is a painless test that uses sound waves to create images of your heart. It provides your doctor with information about the size and shape of your heart and how well your heart's chambers and valves are working. This procedure takes approximately one hour. There are no restrictions for this procedure. LOCATION: [facility]: [place], [place], [place] [address]  Your physician has requested that you have cardiac CT. Cardiac computed tomography (CT) is a painless test that uses an x-ray machine to take clear, detailed pictures of your heart. For further information please visit HugeFiesta.[email or web address]. Please follow instruction sheet as given.    Follow-Up: At [facility], you and your health needs are our priority.  As part of our continuing mission to provide you with exceptional heart care, we have created designated Provider Care Teams.  These Care Teams include your primary Cardiologist (physician) and Advanced Practice Providers (APPs -  Physician Assistants and Nurse Practitioners) who all work together to provide you with the care you need, when you need it.  Your next  appointment:   11/08/2019 OR 11/09/2019  The format for your next appointment:   In Person  Provider:   Cherlynn Kaiser, MD  _____________________________________________________________  Your cardiac CT will be scheduled at one of the below locations:   [facility] [address] [place], [place] [address] [phone]  [place] [address] [place], [place] [address] [phone]  If scheduled at [facility], please arrive at the [facility] main entrance of [facility] 30-45 minutes prior to test start time. Proceed to the [facility] Radiology Department (first floor) to check-in and test prep.  If scheduled at [facility], please arrive 15 mins early for check-in and test prep.  Please follow these instructions carefully (unless otherwise directed):   On the Night Before the Test: . Be sure to Drink plenty of water. . Do not consume any caffeinated/decaffeinated beverages or chocolate 12 hours prior to your test. . Do not take any antihistamines 12 hours prior to your test.   On the Day of the Test: . Drink plenty of water. Do not drink any water within one hour of the test. . Do not eat any food 4 hours prior to the test. . You may take your regular medications prior to the test.  . HOLD Furosemide/Hydrochlorothiazide/spironolactone morning of the test. . FEMALES- please wear underwire-free bra if available       After the Test: . Drink plenty of water. . After receiving IV contrast, you may experience a mild flushed feeling. This is normal. . On  occasion, you may experience a mild rash up to 24 hours after the test. This is not dangerous. If this occurs, you can take Benadryl 25 mg and increase your fluid intake. . If you experience trouble breathing, this can be serious. If it is severe call 911 IMMEDIATELY. If it is mild, please call our office. . If  you take any of these medications: Glipizide/Metformin, Avandament, Glucavance, please do not take 48 hours after completing test unless otherwise instructed.   Once we have confirmed authorization from your insurance company, we will call you to set up a date and time for your test.   For non-scheduling related questions, please contact the cardiac imaging nurse navigator should you have any questions/concerns: Marchia Bond, RN Navigator Cardiac Imaging [facility] [phone] Office

## 2019-10-30 ENCOUNTER — Other Ambulatory Visit: Payer: Self-pay

## 2019-10-30 ENCOUNTER — Ambulatory Visit (HOSPITAL_COMMUNITY)
Admission: RE | Admit: 2019-10-30 | Discharge: 2019-10-30 | Disposition: A | Discharge status: Home / Self Care | Payer: Medicare Other | Source: Ambulatory Visit | Attending: Internal Medicine | Admitting: Internal Medicine

## 2019-10-30 DIAGNOSIS — I119 Hypertensive heart disease without heart failure: Secondary | ICD-10-CM | POA: Insufficient documentation

## 2019-10-30 DIAGNOSIS — I361 Nonrheumatic tricuspid (valve) insufficiency: Secondary | ICD-10-CM

## 2019-10-30 DIAGNOSIS — I083 Combined rheumatic disorders of mitral, aortic and tricuspid valves: Secondary | ICD-10-CM | POA: Diagnosis not present

## 2019-10-30 DIAGNOSIS — E7800 Pure hypercholesterolemia, unspecified: Secondary | ICD-10-CM | POA: Insufficient documentation

## 2019-10-30 DIAGNOSIS — I340 Nonrheumatic mitral (valve) insufficiency: Secondary | ICD-10-CM | POA: Diagnosis not present

## 2019-10-30 DIAGNOSIS — R079 Chest pain, unspecified: Secondary | ICD-10-CM | POA: Diagnosis not present

## 2019-10-30 DIAGNOSIS — G4730 Sleep apnea, unspecified: Secondary | ICD-10-CM | POA: Diagnosis not present

## 2019-10-30 NOTE — Progress Notes (Signed)
*  PRELIMINARY RESULTS* Echocardiogram 2D Echocardiogram has been performed.  Linda [name] 10/30/2019, 10:37 AM

## 2019-10-31 DIAGNOSIS — Z23 Encounter for immunization: Secondary | ICD-10-CM | POA: Diagnosis not present

## 2019-10-31 LAB — BASIC METABOLIC PANEL
BUN/Creatinine Ratio: 16 (ref 12–28)
BUN: 16 mg/dL (ref 8–27)
CO2: 25 mmol/L (ref 20–29)
Calcium: 10 mg/dL (ref 8.7–10.3)
Chloride: 102 mmol/L (ref 96–106)
Creatinine, Ser: 1.03 mg/dL — ABNORMAL HIGH (ref 0.57–1.00)
GFR calc Af Amer: 61 mL/min/{1.73_m2} (ref >59)
GFR calc non Af Amer: 53 mL/min/{1.73_m2} — ABNORMAL LOW (ref >59)
Glucose: 84 mg/dL (ref 65–99)
Potassium: 4.7 mmol/L (ref 3.5–5.2)
Sodium: 141 mmol/L (ref 134–144)

## 2019-11-03 ENCOUNTER — Telehealth: Payer: Self-pay

## 2019-11-03 NOTE — Telephone Encounter (Signed)
Contacted pt to inquire if she has both medicare and private insurance or only medicare. No answer. LMTCB

## 2019-11-08 ENCOUNTER — Encounter: Payer: Self-pay | Admitting: Internal Medicine

## 2019-11-08 ENCOUNTER — Other Ambulatory Visit: Payer: Self-pay

## 2019-11-08 ENCOUNTER — Ambulatory Visit (INDEPENDENT_AMBULATORY_CARE_PROVIDER_SITE_OTHER): Payer: Medicare Other | Admitting: Internal Medicine

## 2019-11-08 VITALS — BP 117/48 | HR 50 | Temp 96.9°F | Ht 61.0 in | Wt 177.6 lb

## 2019-11-08 DIAGNOSIS — Z8249 Family history of ischemic heart disease and other diseases of the circulatory system: Secondary | ICD-10-CM | POA: Diagnosis not present

## 2019-11-08 DIAGNOSIS — Z79899 Other long term (current) drug therapy: Secondary | ICD-10-CM

## 2019-11-08 DIAGNOSIS — R079 Chest pain, unspecified: Secondary | ICD-10-CM

## 2019-11-08 DIAGNOSIS — E785 Hyperlipidemia, unspecified: Secondary | ICD-10-CM

## 2019-11-08 DIAGNOSIS — I10 Essential (primary) hypertension: Secondary | ICD-10-CM | POA: Diagnosis not present

## 2019-11-08 NOTE — Patient Instructions (Addendum)
Medication Instructions:  No changes *If you need a refill on your cardiac medications before your next appointment, please call your pharmacy*  Lab Work: None ordered If you have labs (blood work) drawn today and your tests are completely normal, you will receive your results only by: Marland Kitchen MyChart Message (if you have MyChart) OR . A paper copy in the mail If you have any lab test that is abnormal or we need to change your treatment, we will call you to review the results.  Testing/Procedures: Your physician has requested that you have a lexiscan myoview. For further information please visit HugeFiesta.tn. Please follow instruction sheet, as given.   How to prepare for your Myocardial Perfusion Test:  Do not eat or drink 3 hours prior to your test, except you may have water.  Do not consume products containing caffeine (regular or decaffeinated) 12 hours prior to your test. (ex: coffee, chocolate, sodas, tea).  Do bring a list of your current medications with you.  If not listed below, you may take your medications as normal.  Do wear comfortable clothes (no dresses or overalls) and walking shoes, tennis shoes preferred (No heels or open toe shoes are allowed).  Do NOT wear cologne, perfume, aftershave, or lotions (deodorant is allowed).  The test will take approximately 3 to 4 hours to complete  If these instructions are not followed, your test will have to be rescheduled.  Hold spironolactone and furosemide the morning of the test.  Follow-Up: At [facility], you and your health needs are our priority.  As part of our continuing mission to provide you with exceptional heart care, we have created designated Provider Care Teams.  These Care Teams include your primary Cardiologist (physician) and Advanced Practice Providers (APPs -  Physician Assistants and Nurse Practitioners) who all work together to provide you with the care you need, when you need it.  Your next  appointment:   1 month(s)  The format for your next appointment:   In Person  Provider:   Cherlynn Kaiser, MD

## 2019-11-08 NOTE — Progress Notes (Signed)
Cardiology Office Note:    Date:  11/08/2019   ID:  Linda [name], DOB December 06, 1940, MRN [id number]  PCP:  Crist Infante, MD  Cardiologist:  No primary care provider on file.  Electrophysiologist:  None   Referring MD: Crist Infante, MD   Chief Complaint: f/u Murmur, atypical chest pain, cardiac risk factors  History of Present Illness:    Linda [name] is a 79 y.o. female with a hx of obesity, hyperlipidemia, hypertension, GERD, OSA not requiring treatment given weight loss, frequent falls. She presents for follow up of testing and review of blood pressure after starting spironolactone for HTN.   She has overall felt well but did note some chest soreness right around ribs yesterday left and right side. She has tolerated the addition of spironolactone well, Cr is stable. BP today in office is 117/48 mmHg, may be spuriously low.   We reviewed results of echocardiogram demonstrating preserved EF, grade 2 diastolic dysfunction, and aortic valve sclerosis without stenosis. Mild MR, Mild AR.   CCTA has not been performed yet and is scheduled for about a month from now.   Past Medical History:  Diagnosis Date  . Anxiety   . Candida esophagitis ([place])   . Chronic kidney disease   . DDD (degenerative disc disease)   . Depression   . Erosive gastritis   . Falls   . GERD (gastroesophageal reflux disease)   . Hiatal hernia   . High cholesterol   . History of anal fissures   . Hypertension   . Obesity   . Osteoarthritis   . Panic disorder   . Sleep apnea    Does not need CPAP anymore    Past Surgical History:  Procedure Laterality Date  . ABDOMINAL HYSTERECTOMY  1987   LSO / menorrhagia & leiomyomata  . APPENDECTOMY    . BREAST EXCISIONAL BIOPSY Left 1989   benign  . BREAST SURGERY  1989   benign tumor left breast  . CHOLECYSTECTOMY  2001  . TUBAL LIGATION      Current Medications: Current Meds  Medication Sig  . ALPRAZolam (XANAX) 1 MG tablet Take 0.5-1 mg by  mouth 3 (three) times daily as needed for anxiety.   . AMBULATORY NON FORMULARY MEDICATION FDGARD 2 capsules by mouth twice a day  . amLODipine (NORVASC) 10 MG tablet Take 10 mg by mouth daily.   . benazepril (LOTENSIN) 40 MG tablet Take 40 mg by mouth daily.    Marland Kitchen BIOTIN PO Take 1 tablet by mouth daily.  Marland Kitchen BYSTOLIC 5 MG tablet Take 5 mg by mouth daily.  Marland Kitchen desvenlafaxine (PRISTIQ) 100 MG 24 hr tablet Take 100 mg by mouth daily.   Marland Kitchen dexlansoprazole (DEXILANT) 60 MG capsule Take 1 capsule (60 mg total) by mouth daily.  . famotidine (SM ACID REDUCER MAX ST) 20 MG tablet TAKE ONE TABLET (20MG  TOTAL) BY MOUTH ATBEDTIME  . furosemide (LASIX) 20 MG tablet Take 20 mg by mouth as needed.  . lamoTRIgine (LAMICTAL) 200 MG tablet Take 200 mg by mouth 2 (two) times daily.   Marland Kitchen loratadine (CLARITIN) 10 MG tablet Take 10 mg by mouth daily.  . meclizine (ANTIVERT) 25 MG tablet Take 25 mg by mouth 3 (three) times daily as needed for dizziness.  . ondansetron (ZOFRAN) 8 MG tablet Take 1 tablet (8 mg total) by mouth every 8 (eight) hours as needed for nausea.  . potassium chloride (KLOR-CON) 20 MEQ packet Take 20 mEq by mouth daily.  Marland Kitchen  simvastatin (ZOCOR) 20 MG tablet   . spironolactone (ALDACTONE) 25 MG tablet Take 1 tablet (25 mg total) by mouth daily.   Current Facility-Administered Medications for the 11/08/19 encounter (Office Visit) with Elouise Munroe, MD  Medication  . 0.9 %  sodium chloride infusion     Allergies:   Amoxicillin and Abilify [aripiprazole]   Social History   Socioeconomic History  . Marital status: Married    Spouse name: Not on file  . Number of children: 1  . Years of education: Not on file  . Highest education level: Not on file  Occupational History  . Occupation: Retired   Tobacco Use  . Smoking status: Never Smoker  . Smokeless tobacco: Never Used  Substance and Sexual Activity  . Alcohol use: No  . Drug use: No  . Sexual activity: Yes  Other Topics Concern  .  Not on file  Social History Narrative   Daily caffeine    Social Determinants of Health   Financial Resource Strain:   . Difficulty of Paying Living Expenses: Not on file  Food Insecurity:   . Worried About Charity fundraiser in the Last Year: Not on file  . Ran Out of Food in the Last Year: Not on file  Transportation Needs:   . Lack of Transportation (Medical): Not on file  . Lack of Transportation (Non-Medical): Not on file  Physical Activity:   . Days of Exercise per Week: Not on file  . Minutes of Exercise per Session: Not on file  Stress:   . Feeling of Stress : Not on file  Social Connections:   . Frequency of Communication with Friends and Family: Not on file  . Frequency of Social Gatherings with Friends and Family: Not on file  . Attends Religious Services: Not on file  . Active Member of Clubs or Organizations: Not on file  . Attends Archivist Meetings: Not on file  . Marital Status: Not on file     Family History: The patient's family history includes Diabetes in her brother and sister; Heart attack in her brother, brother, and brother; Heart disease in her brother, brother, brother, and mother; Hypertension in her sister; Lung cancer in her father; Ovarian cancer in her sister. There is no history of Colon cancer.  ROS:   Please see the history of present illness.    All other systems reviewed and are negative.  EKGs/Labs/Other Studies Reviewed:    The following studies were reviewed today:  EKG:  Not performed today 10/23/2019 - Sinus bradycardia rate 49 with sinus arrhythmia, left axis deviation, right bundle branch block, QRS duration 146 ms. ECG 05/22/2018- sinus rhythm right bundle branch block, heart rate 50   Recent Labs: 10/30/2019: BUN 16; Creatinine, Ser 1.03; Potassium 4.7; Sodium 141  Recent Lipid Panel No results found for: CHOL, TRIG, HDL, CHOLHDL, VLDL, LDLCALC, LDLDIRECT  Physical Exam:    VS:  BP (!) 117/48   Pulse (!) 50    Temp (!) 96.9 F (36.1 C)   Ht 5\' 1"  (1.549 m)   Wt 177 lb 9.6 oz (80.6 kg)   SpO2 95%   BMI 33.56 kg/m     Wt Readings from Last 5 Encounters:  11/08/19 177 lb 9.6 oz (80.6 kg)  10/23/19 182 lb (82.6 kg)  07/13/19 181 lb (82.1 kg)  11/09/18 179 lb (81.2 kg)  06/23/18 179 lb 2 oz (81.3 kg)     Constitutional: No acute distress Eyes: sclera  non-icteric, normal conjunctiva and lids ENMT: normal dentition, moist mucous membranes Cardiovascular: regular rhythm, normal rate, no murmurs. S1 and S2 normal. Radial pulses normal bilaterally. No jugular venous distention.  Respiratory: clear to auscultation bilaterally GI : normal bowel sounds, soft and nontender. No distention.   MSK: extremities warm, well perfused. No edema.  NEURO: grossly nonfocal exam, moves all extremities. PSYCH: alert and oriented x 3, normal mood and affect.   ASSESSMENT:    1. Chest pain, unspecified type   2. Hypertension, unspecified type   3. Medication management   4. Hyperlipidemia, unspecified hyperlipidemia type   5. Family history of premature CAD    PLAN:    Chest pain - I am concerned about her chest pain symptoms described to me at our last visit in the context of her significant family history of CAD and risk factors of HTN and HLD. I think an ischemic evaluation should be performed sooner, therefore we will transition our workup to a stress myoview to exclude flow limiting lesions. We will cancel CCTA.   HTN - better controlled on spironolactone 25mg  daily in addition to bystolic, benazepril, amlodipine, furosemide. The addition of spironolactone will help in the setting of grade II diastolic dysfunction.    Bradycardia - stable and asymptomatic.   BMI 33.5 - aggresvie risk factor modification discussed.   HLD - need to discuss statin therapy after results of stress testing.   F/u with me in 1 mo, or sooner if stress test abnormal.    Total time of encounter: 30 minutes total time of  encounter, including 20 minutes spent in face-to-face patient care. This time includes coordination of care and counseling regarding chest pain and HTN. Remainder of non-face-to-face time involved reviewing chart documents/testing relevant to the patient encounter and documentation in the medical record.  Cherlynn Kaiser, MD [place]  [facility]   Medication Adjustments/Labs and Tests Ordered: Current medicines are reviewed at length with the patient today.  Concerns regarding medicines are outlined above.  Orders Placed This Encounter  Procedures  . MYOCARDIAL PERFUSION IMAGING   No orders of the defined types were placed in this encounter.   Patient Instructions  Medication Instructions:  No changes *If you need a refill on your cardiac medications before your next appointment, please call your pharmacy*  Lab Work: None ordered If you have labs (blood work) drawn today and your tests are completely normal, you will receive your results only by: Marland Kitchen MyChart Message (if you have MyChart) OR . A paper copy in the mail If you have any lab test that is abnormal or we need to change your treatment, we will call you to review the results.  Testing/Procedures: Your physician has requested that you have a lexiscan myoview. For further information please visit [email or web address]. Please follow instruction sheet, as given.   How to prepare for your Myocardial Perfusion Test:  Do not eat or drink 3 hours prior to your test, except you may have water.  Do not consume products containing caffeine (regular or decaffeinated) 12 hours prior to your test. (ex: coffee, chocolate, sodas, tea).  Do bring a list of your current medications with you.  If not listed below, you may take your medications as normal.  Do wear comfortable clothes (no dresses or overalls) and walking shoes, tennis shoes preferred (No heels or open toe shoes are allowed).  Do NOT wear cologne, perfume, aftershave,  or lotions (deodorant is allowed).  The test will take approximately 3 to  4 hours to complete  If these instructions are not followed, your test will have to be rescheduled.  Hold spironolactone and furosemide the morning of the test.  Follow-Up: At [facility], you and your health needs are our priority.  As part of our continuing mission to provide you with exceptional heart care, we have created designated Provider Care Teams.  These Care Teams include your primary Cardiologist (physician) and Advanced Practice Providers (APPs -  Physician Assistants and Nurse Practitioners) who all work together to provide you with the care you need, when you need it.  Your next appointment:   1 month(s)  The format for your next appointment:   In Person  Provider:   Cherlynn Kaiser, MD

## 2019-11-10 ENCOUNTER — Telehealth (HOSPITAL_COMMUNITY): Payer: Self-pay | Admitting: *Deleted

## 2019-11-10 NOTE — Telephone Encounter (Signed)
Patient given detailed instructions per Myocardial Perfusion Study Information Sheet for the test on 11/13/19 at 7:45. Patient notified to arrive 15 minutes early and that it is imperative to arrive on time for appointment to keep from having the test rescheduled.  If you need to cancel or reschedule your appointment, please call the office within 24 hours of your appointment. . Patient verbalized understanding.Linda [name]

## 2019-11-13 ENCOUNTER — Ambulatory Visit (HOSPITAL_COMMUNITY): Payer: Medicare Other | Attending: Cardiology

## 2019-11-13 ENCOUNTER — Other Ambulatory Visit: Payer: Self-pay

## 2019-11-13 VITALS — Ht 61.0 in | Wt 177.0 lb

## 2019-11-13 DIAGNOSIS — R079 Chest pain, unspecified: Secondary | ICD-10-CM | POA: Insufficient documentation

## 2019-11-13 LAB — MYOCARDIAL PERFUSION IMAGING
LV dias vol: 82 mL (ref 46–106)
LV sys vol: 22 mL
Peak HR: 67 {beats}/min
Rest HR: 55 {beats}/min
SDS: 1
SRS: 0
SSS: 1
TID: 0.97

## 2019-11-13 MED ORDER — TECHNETIUM TC 99M TETROFOSMIN IV KIT
32.6000 | PACK | Freq: Once | INTRAVENOUS | Status: AC | PRN
  Administered 2019-11-13: 32.6 via INTRAVENOUS
  Filled 2019-11-13: qty 33

## 2019-11-13 MED ORDER — TECHNETIUM TC 99M TETROFOSMIN IV KIT
10.6000 | PACK | Freq: Once | INTRAVENOUS | Status: AC | PRN
  Administered 2019-11-13: 10.6 via INTRAVENOUS
  Filled 2019-11-13: qty 11

## 2019-11-13 MED ORDER — REGADENOSON 0.4 MG/5ML IV SOLN
0.4000 mg | Freq: Once | INTRAVENOUS | Status: AC
  Administered 2019-11-13: 0.4 mg via INTRAVENOUS

## 2019-11-14 ENCOUNTER — Telehealth: Payer: Self-pay | Admitting: Internal Medicine

## 2019-11-14 NOTE — Telephone Encounter (Signed)
We had discussed that with mild MR and mild AR, we would follow those valves over time, but nothing as needed at this time. In addition, with grade 2 diastolic dysfunction, I would monitor her for symptoms of SOB or heart failure, and she should let us know if she feels unwell.  Her stress test was low risk performed yesterday.  Please let me know if I can clarify further.

## 2019-11-14 NOTE — Telephone Encounter (Signed)
New Message   Patient is calling to go back over her echocardiogram results and to discuss it.

## 2019-11-14 NOTE — Telephone Encounter (Signed)
Called patient- advised of message from MD.  Patient verbalized understanding. 

## 2019-11-14 NOTE — Telephone Encounter (Signed)
Called patient- she states that at her visit Dr.Acharya stated she was going to monitor something from her ECHO- I reviewed the ECHO but unsure of what she was going to monitor for patient. I advised patient that I would send a message back to MD to advise on this.  Patient was thankful for the call.

## 2019-12-01 DIAGNOSIS — Z23 Encounter for immunization: Secondary | ICD-10-CM | POA: Diagnosis not present

## 2019-12-11 ENCOUNTER — Ambulatory Visit (INDEPENDENT_AMBULATORY_CARE_PROVIDER_SITE_OTHER): Payer: Medicare Other | Admitting: Internal Medicine

## 2019-12-11 ENCOUNTER — Other Ambulatory Visit: Payer: Self-pay

## 2019-12-11 VITALS — BP 145/59 | HR 62 | Temp 97.2°F | Ht 61.0 in | Wt 178.8 lb

## 2019-12-11 DIAGNOSIS — Z8249 Family history of ischemic heart disease and other diseases of the circulatory system: Secondary | ICD-10-CM

## 2019-12-11 DIAGNOSIS — I10 Essential (primary) hypertension: Secondary | ICD-10-CM | POA: Diagnosis not present

## 2019-12-11 DIAGNOSIS — Z79899 Other long term (current) drug therapy: Secondary | ICD-10-CM | POA: Diagnosis not present

## 2019-12-11 DIAGNOSIS — E785 Hyperlipidemia, unspecified: Secondary | ICD-10-CM

## 2019-12-11 DIAGNOSIS — R079 Chest pain, unspecified: Secondary | ICD-10-CM

## 2019-12-11 NOTE — Patient Instructions (Signed)
Medication Instructions:  NO CHANGES *If you need a refill on your cardiac medications before your next appointment, please call your pharmacy*  Lab Work: NOT NEEDED  Testing/Procedures: NOT NEEDED  Follow-Up: At [facility], you and your health needs are our priority.  As part of our continuing mission to provide you with exceptional heart care, we have created designated Provider Care Teams.  These Care Teams include your primary Cardiologist (physician) and Advanced Practice Providers (APPs -  Physician Assistants and Nurse Practitioners) who all work together to provide you with the care you need, when you need it.  Your next appointment:   6 month(s)  The format for your next appointment:   In Person  Provider:   Cherlynn Kaiser, MD  Other Instructions N/A

## 2019-12-11 NOTE — Progress Notes (Signed)
Cardiology Office Note:    Date:  12/11/2019   ID:  [name], DOB 03-Nov-1940, MRN [id number]  PCP:  Crist Infante, MD  Cardiologist:  Elouise Munroe, MD  Electrophysiologist:  None   Referring MD: Crist Infante, MD   Chief Complaint: f/u Murmur, atypical chest pain, cardiac risk factors  History of Present Illness:    Linda [name] is a 79 y.o. female with a history of obesity, hyperlipidemia, hypertension, GERD, OSA not requiring treatment given weight loss, frequent falls. She presents for follow up of testing and review of blood pressure after starting spironolactone for HTN. Can tell when BP goes up, hasn't happened in 6-8 weeks.   BP at home: 138/69, 140/70.   She had pain on Saturday probably acid reflux.  We reviewed results of echocardiogram demonstrating preserved EF, grade 2 diastolic dysfunction, and aortic valve sclerosis without stenosis. Mild MR, Mild AR.   CCTA not performed due to delay and concern for more urgent symptoms. Stress myoview was normal.   Past Medical History:  Diagnosis Date  . Anxiety   . Candida esophagitis ([place])   . Chronic kidney disease   . DDD (degenerative disc disease)   . Depression   . Erosive gastritis   . Falls   . GERD (gastroesophageal reflux disease)   . Hiatal hernia   . High cholesterol   . History of anal fissures   . Hypertension   . Obesity   . Osteoarthritis   . Panic disorder   . Sleep apnea    Does not need CPAP anymore    Past Surgical History:  Procedure Laterality Date  . ABDOMINAL HYSTERECTOMY  1987   LSO / menorrhagia & leiomyomata  . APPENDECTOMY    . BREAST EXCISIONAL BIOPSY Left 1989   benign  . BREAST SURGERY  1989   benign tumor left breast  . CHOLECYSTECTOMY  2001  . TUBAL LIGATION      Current Medications: Current Meds  Medication Sig  . ALPRAZolam (XANAX) 1 MG tablet Take 0.5-1 mg by mouth 3 (three) times daily as needed for anxiety.   . AMBULATORY NON FORMULARY  MEDICATION FDGARD 2 capsules by mouth twice a day  . amLODipine (NORVASC) 10 MG tablet Take 10 mg by mouth daily.   Marland Kitchen aspirin 81 MG tablet Take 81 mg by mouth daily.    . benazepril (LOTENSIN) 40 MG tablet Take 40 mg by mouth daily.    Marland Kitchen BIOTIN PO Take 1 tablet by mouth daily.  Marland Kitchen BYSTOLIC 5 MG tablet Take 5 mg by mouth daily.  Marland Kitchen desvenlafaxine (PRISTIQ) 100 MG 24 hr tablet Take 100 mg by mouth daily.   Marland Kitchen dexlansoprazole (DEXILANT) 60 MG capsule Take 1 capsule (60 mg total) by mouth daily.  . famotidine (SM ACID REDUCER MAX ST) 20 MG tablet TAKE ONE TABLET (20MG  TOTAL) BY MOUTH ATBEDTIME  . furosemide (LASIX) 20 MG tablet Take 20 mg by mouth as needed.  . lamoTRIgine (LAMICTAL) 200 MG tablet Take 200 mg by mouth 2 (two) times daily.   Marland Kitchen loratadine (CLARITIN) 10 MG tablet Take 10 mg by mouth daily.  . meclizine (ANTIVERT) 25 MG tablet Take 25 mg by mouth 3 (three) times daily as needed for dizziness.  . ondansetron (ZOFRAN) 8 MG tablet Take 1 tablet (8 mg total) by mouth every 8 (eight) hours as needed for nausea.  . potassium chloride (KLOR-CON) 20 MEQ packet Take 20 mEq by mouth daily.  . simvastatin (ZOCOR)  20 MG tablet   . spironolactone (ALDACTONE) 25 MG tablet Take 1 tablet (25 mg total) by mouth daily.     Allergies:   Amoxicillin and Abilify [aripiprazole]   Social History   Socioeconomic History  . Marital status: Married    Spouse name: Not on file  . Number of children: 1  . Years of education: Not on file  . Highest education level: Not on file  Occupational History  . Occupation: Retired   Tobacco Use  . Smoking status: Never Smoker  . Smokeless tobacco: Never Used  Substance and Sexual Activity  . Alcohol use: No  . Drug use: No  . Sexual activity: Yes  Other Topics Concern  . Not on file  Social History Narrative   Daily caffeine    Social Determinants of Health   Financial Resource Strain:   . Difficulty of Paying Living Expenses: Not on file  Food  Insecurity:   . Worried About Charity fundraiser in the Last Year: Not on file  . Ran Out of Food in the Last Year: Not on file  Transportation Needs:   . Lack of Transportation (Medical): Not on file  . Lack of Transportation (Non-Medical): Not on file  Physical Activity:   . Days of Exercise per Week: Not on file  . Minutes of Exercise per Session: Not on file  Stress:   . Feeling of Stress : Not on file  Social Connections:   . Frequency of Communication with Friends and Family: Not on file  . Frequency of Social Gatherings with Friends and Family: Not on file  . Attends Religious Services: Not on file  . Active Member of Clubs or Organizations: Not on file  . Attends Archivist Meetings: Not on file  . Marital Status: Not on file     Family History: The patient's family history includes Diabetes in her brother and sister; Heart attack in her brother, brother, and brother; Heart disease in her brother, brother, brother, and mother; Hypertension in her sister; Lung cancer in her father; Ovarian cancer in her sister. There is no history of Colon cancer.  ROS:   Please see the history of present illness.    All other systems reviewed and are negative.  EKGs/Labs/Other Studies Reviewed:    The following studies were reviewed today:  EKG:  Not performed today.   Recent Labs: 10/30/2019: BUN 16; Creatinine, Ser 1.03; Potassium 4.7; Sodium 141  Recent Lipid Panel No results found for: CHOL, TRIG, HDL, CHOLHDL, VLDL, LDLCALC, LDLDIRECT  Physical Exam:    VS:  BP (!) 145/59   Pulse 62   Temp (!) 97.2 F (36.2 C)   Ht 5\' 1"  (1.549 m)   Wt 178 lb 12.8 oz (81.1 kg)   SpO2 96%   BMI 33.78 kg/m     Wt Readings from Last 5 Encounters:  12/11/19 178 lb 12.8 oz (81.1 kg)  11/13/19 177 lb (80.3 kg)  11/08/19 177 lb 9.6 oz (80.6 kg)  10/23/19 182 lb (82.6 kg)  07/13/19 181 lb (82.1 kg)     Constitutional: No acute distress Eyes: sclera non-icteric, normal  conjunctiva and lids ENMT: normal dentition, moist mucous membranes Cardiovascular: regular rhythm, normal rate. No jugular venous distention.  Respiratory:no increased work of breathing. MSK: extremities warm, well perfused. No edema.  NEURO: grossly nonfocal exam, moves all extremities. PSYCH: alert and oriented x 3, normal mood and affect.   ASSESSMENT:    1. Chest pain,  unspecified type   2. Hypertension, unspecified type   3. Medication management   4. Hyperlipidemia, unspecified hyperlipidemia type   5. Family history of premature CAD    PLAN:    Chest pain - stress myoview normal. Patient feels it might be more consistent with acid reflux.   HTN - better controlled on spironolactone 25mg  daily in addition to bystolic, benazepril, amlodipine, furosemide. The addition of spironolactone will help in the setting of grade II diastolic dysfunction. Patient stable on this dose. Diastolic bp lower, will not uptitrate at this time.    Bradycardia - stable and asymptomatic.   BMI 33.5 - aggressive risk factor modification discussed.   HLD - on simvastatin 20 mg daily, consider uptitration for suboptimal LDL. Can recheck lipids with PCP at next follow up, consider transition to atorvastatin or rosuvastatin for more aggressive lipid control.   Total time of encounter: 30 minutes total time of encounter, including 20 minutes spent in face-to-face patient care. This time includes coordination of care and counseling regarding above mentioned problem list. Remainder of non-face-to-face time involved reviewing chart documents/testing relevant to the patient encounter and documentation in the medical record. Cherlynn Kaiser, MD [employer] [facility]  [facility]    Medication Adjustments/Labs and Tests Ordered: Current medicines are reviewed at length with the patient today.  Concerns regarding medicines are outlined above.  No orders of the defined types were placed in this encounter.  No  orders of the defined types were placed in this encounter.   Patient Instructions  Medication Instructions:  NO CHANGES *If you need a refill on your cardiac medications before your next appointment, please call your pharmacy*  Lab Work: NOT NEEDED  Testing/Procedures: NOT NEEDED  Follow-Up: At [facility], you and your health needs are our priority.  As part of our continuing mission to provide you with exceptional heart care, we have created designated Provider Care Teams.  These Care Teams include your primary Cardiologist (physician) and Advanced Practice Providers (APPs -  Physician Assistants and Nurse Practitioners) who all work together to provide you with the care you need, when you need it.  Your next appointment:   6 month(s)  The format for your next appointment:   In Person  Provider:   Cherlynn Kaiser, MD  Other Instructions N/A

## 2019-12-13 ENCOUNTER — Telehealth: Payer: Self-pay | Admitting: Gastroenterology

## 2019-12-14 NOTE — Telephone Encounter (Signed)
Patient advised she should speak with her dentist.  She will call back for additional questions or concerns.

## 2020-01-10 ENCOUNTER — Emergency Department (HOSPITAL_COMMUNITY)
Admission: EM | Admit: 2020-01-10 | Discharge: 2020-01-10 | Disposition: A | Discharge status: Home / Self Care | Payer: Medicare Other | Attending: Emergency Medicine | Admitting: Emergency Medicine

## 2020-01-10 ENCOUNTER — Emergency Department (HOSPITAL_COMMUNITY): Payer: Medicare Other

## 2020-01-10 ENCOUNTER — Other Ambulatory Visit: Payer: Self-pay

## 2020-01-10 ENCOUNTER — Encounter (HOSPITAL_COMMUNITY): Payer: Self-pay | Admitting: Emergency Medicine

## 2020-01-10 DIAGNOSIS — N189 Chronic kidney disease, unspecified: Secondary | ICD-10-CM | POA: Diagnosis not present

## 2020-01-10 DIAGNOSIS — S0083XA Contusion of other part of head, initial encounter: Secondary | ICD-10-CM | POA: Diagnosis not present

## 2020-01-10 DIAGNOSIS — W010XXA Fall on same level from slipping, tripping and stumbling without subsequent striking against object, initial encounter: Secondary | ICD-10-CM | POA: Diagnosis not present

## 2020-01-10 DIAGNOSIS — S62101A Fracture of unspecified carpal bone, right wrist, initial encounter for closed fracture: Secondary | ICD-10-CM | POA: Diagnosis not present

## 2020-01-10 DIAGNOSIS — S82002A Unspecified fracture of left patella, initial encounter for closed fracture: Secondary | ICD-10-CM

## 2020-01-10 DIAGNOSIS — S82045A Nondisplaced comminuted fracture of left patella, initial encounter for closed fracture: Secondary | ICD-10-CM | POA: Diagnosis not present

## 2020-01-10 DIAGNOSIS — Y92830 Public park as the place of occurrence of the external cause: Secondary | ICD-10-CM | POA: Insufficient documentation

## 2020-01-10 DIAGNOSIS — S52501A Unspecified fracture of the lower end of right radius, initial encounter for closed fracture: Secondary | ICD-10-CM | POA: Diagnosis not present

## 2020-01-10 DIAGNOSIS — Y9389 Activity, other specified: Secondary | ICD-10-CM | POA: Diagnosis not present

## 2020-01-10 DIAGNOSIS — S199XXA Unspecified injury of neck, initial encounter: Secondary | ICD-10-CM | POA: Diagnosis not present

## 2020-01-10 DIAGNOSIS — Y999 Unspecified external cause status: Secondary | ICD-10-CM | POA: Insufficient documentation

## 2020-01-10 DIAGNOSIS — W19XXXA Unspecified fall, initial encounter: Secondary | ICD-10-CM

## 2020-01-10 DIAGNOSIS — M25531 Pain in right wrist: Secondary | ICD-10-CM | POA: Diagnosis not present

## 2020-01-10 DIAGNOSIS — S0990XA Unspecified injury of head, initial encounter: Secondary | ICD-10-CM | POA: Diagnosis not present

## 2020-01-10 DIAGNOSIS — I129 Hypertensive chronic kidney disease with stage 1 through stage 4 chronic kidney disease, or unspecified chronic kidney disease: Secondary | ICD-10-CM | POA: Insufficient documentation

## 2020-01-10 DIAGNOSIS — S8992XA Unspecified injury of left lower leg, initial encounter: Secondary | ICD-10-CM | POA: Diagnosis not present

## 2020-01-10 DIAGNOSIS — M25562 Pain in left knee: Secondary | ICD-10-CM | POA: Diagnosis not present

## 2020-01-10 DIAGNOSIS — Z79899 Other long term (current) drug therapy: Secondary | ICD-10-CM | POA: Diagnosis not present

## 2020-01-10 DIAGNOSIS — S6991XA Unspecified injury of right wrist, hand and finger(s), initial encounter: Secondary | ICD-10-CM | POA: Diagnosis present

## 2020-01-10 DIAGNOSIS — M7989 Other specified soft tissue disorders: Secondary | ICD-10-CM | POA: Diagnosis not present

## 2020-01-10 MED ORDER — ACETAMINOPHEN 500 MG PO TABS: 500.0000 mg | ORAL_TABLET | Freq: Four times a day (QID) | ORAL | 0 refills | Status: AC | PRN

## 2020-01-10 NOTE — ED Notes (Signed)
Pt ambulated in room with walker.

## 2020-01-10 NOTE — Discharge Instructions (Signed)
You were seen in the ER after a fall.  Your x-ray showed a right wrist fracture and a left patella fracture.  Your CT scans did not show any bleeds or fractures to your skull or neck.  Your injuries are not life-threatening but do need very close follow-up and you will need to wear your wrist splint and knee immobilizer until you are able to follow-up with Dr. Ninfa Linden which should be as soon as possible.  I will send Tylenol for pain relief.  Continue immobilizing your wrist and knee and use the walker to prevent further falls.  Return to the ER if your symptoms worsen.

## 2020-01-10 NOTE — ED Triage Notes (Deleted)
Pt c/o chest pain with shortness of breath that began last night. Pain is in the central chect with no radiation.

## 2020-01-10 NOTE — ED Triage Notes (Signed)
Pt fell and hit her head on the pavement. Patient denies loc and does not take a blood thinner. Patient had a head laceration, left knee pain, and right wrist pain.

## 2020-01-10 NOTE — ED Provider Notes (Signed)
[facility] EMERGENCY DEPARTMENT Provider Note   CSN: [id number] Arrival date & time: 01/10/20  1706     History Chief Complaint  Patient presents with  . Fall    Linda [name] is a 79 y.o. female.  HPI 79 year old female with a history of hypertension, CKD, and anxiety presents to the ER after a fall which happened earlier today.  History provided by patient, no family at bedside.  Patient was out at a park with her family, stepped on some rocks, and fell forward.  Took most of the impact to her face and wrist.  Denies LOC, nausea, vomiting,not on blood thinners.  No dizziness, vomiting, nausea, vision changes currently.  Patient also complains of right wrist pain and left knee pain.  She was brought to the ER by her husband.  States she has not walked since her fall.  She states she has a few scratches on her forehead.  She has no other associated symptoms.    Past Medical History:  Diagnosis Date  . Anxiety   . Candida esophagitis ([place])   . Chronic kidney disease   . DDD (degenerative disc disease)   . Depression   . Erosive gastritis   . Falls   . GERD (gastroesophageal reflux disease)   . Hiatal hernia   . High cholesterol   . History of anal fissures   . Hypertension   . Obesity   . Osteoarthritis   . Panic disorder   . Sleep apnea    Does not need CPAP anymore    Patient Active Problem List   Diagnosis Date Noted  . Acute right-sided low back pain with right-sided sciatica 06/13/2018  . Hypertension   . High cholesterol   . Anxiety     Past Surgical History:  Procedure Laterality Date  . ABDOMINAL HYSTERECTOMY  1987   LSO / menorrhagia & leiomyomata  . APPENDECTOMY    . BREAST EXCISIONAL BIOPSY Left 1989   benign  . BREAST SURGERY  1989   benign tumor left breast  . CHOLECYSTECTOMY  2001  . TUBAL LIGATION       OB History    Gravida  3   Para  1   Term      Preterm      AB  2   Living  1     SAB  2   TAB      Ectopic      Multiple      Live Births              Family History  Problem Relation Age of Onset  . Heart disease Mother   . Lung cancer Father   . Diabetes Sister   . Hypertension Sister   . Diabetes Brother   . Heart disease Brother   . Heart attack Brother   . Ovarian cancer Sister   . Heart disease Brother   . Heart attack Brother   . Heart disease Brother   . Heart attack Brother   . Colon cancer Neg Hx     Social History   Tobacco Use  . Smoking status: Never Smoker  . Smokeless tobacco: Never Used  Substance Use Topics  . Alcohol use: No  . Drug use: No    Home Medications Prior to Admission medications   Medication Sig Start Date End Date Taking? Authorizing Provider  ALPRAZolam Duanne Moron) 1 MG tablet Take 0.5-1 mg by mouth 3 (three) times daily as needed for  anxiety.    Yes [provider]  AMBULATORY NON FORMULARY MEDICATION FDGARD 2 capsules by mouth twice a day   Yes [provider]  amLODipine (NORVASC) 10 MG tablet Take 10 mg by mouth daily.  07/01/12  Yes [provider]  benazepril (LOTENSIN) 40 MG tablet Take 40 mg by mouth daily.     Yes [provider]  BIOTIN PO Take 1 tablet by mouth daily.   Yes [provider]  BYSTOLIC 5 MG tablet Take 5 mg by mouth daily. 06/02/18  Yes [provider]  desvenlafaxine (PRISTIQ) 100 MG 24 hr tablet Take 100 mg by mouth daily.  05/03/18  Yes [provider]  dexlansoprazole (DEXILANT) 60 MG capsule Take 1 capsule (60 mg total) by mouth daily. 07/13/19  Yes Ladene Artist, MD  famotidine (SM ACID REDUCER MAX ST) 20 MG tablet TAKE ONE TABLET (20MG  TOTAL) BY MOUTH ATBEDTIME 10/23/19  Yes Ladene Artist, MD  lamoTRIgine (LAMICTAL) 200 MG tablet Take 200 mg by mouth 2 (two) times daily.    Yes [provider]  loratadine (CLARITIN) 10 MG tablet Take 10 mg by mouth daily.   Yes [provider]  meclizine (ANTIVERT) 25 MG tablet Take 25 mg by mouth 3  (three) times daily as needed for dizziness.   Yes [provider]  ondansetron (ZOFRAN) 8 MG tablet Take 1 tablet (8 mg total) by mouth every 8 (eight) hours as needed for nausea. 01/07/17  Yes Esterwood, Amy S, PA-C  simvastatin (ZOCOR) 20 MG tablet  07/22/18  Yes [provider]  spironolactone (ALDACTONE) 25 MG tablet Take 1 tablet (25 mg total) by mouth daily. 10/23/19 01/21/20 Yes Elouise Munroe, MD  acetaminophen (TYLENOL) 500 MG tablet Take 1 tablet (500 mg total) by mouth every 6 (six) hours as needed for up to 7 days. 01/10/20 01/17/20  Garald Balding, PA-C    Allergies    Amoxicillin and Abilify [aripiprazole]  Review of Systems   Review of Systems  Constitutional: Negative for chills, diaphoresis and fever.  HENT: Negative for ear pain, sinus pain, sore throat and tinnitus.   Eyes: Negative for pain and visual disturbance.  Respiratory: Negative for cough and shortness of breath.   Cardiovascular: Negative for chest pain and palpitations.  Gastrointestinal: Negative for abdominal pain and vomiting.  Genitourinary: Negative for dysuria and hematuria.  Musculoskeletal: Positive for arthralgias and joint swelling. Negative for back pain, neck pain and neck stiffness.  Skin: Negative for color change and rash.  Neurological: Negative for dizziness, seizures, syncope, speech difficulty, weakness, light-headedness, numbness and headaches.  Psychiatric/Behavioral: Negative for agitation and confusion. The patient is not nervous/anxious.   All other systems reviewed and are negative.   Physical Exam Updated Vital Signs BP (!) 154/75   Pulse 67   Temp 98.5 F (36.9 C) (Oral)   Resp 18   Ht 5\' 1"  (1.549 m)   Wt 81.6 kg   SpO2 95%   BMI 34.01 kg/m   Physical Exam Vitals and nursing note reviewed.  Constitutional:      General: She is not in acute distress.    Appearance: She is well-developed.  HENT:     Head: Normocephalic.     Comments: Small 1 cm  laceration on her left forehead and 1 cm laceration left cheek.  No obvious fractures, step-offs, bruising.  EOMs intact.  Patient able to open jaw freely.    Nose:     Comments:  Small laceration appreciated on nose bridge Eyes:     Extraocular Movements: Extraocular movements intact.     Conjunctiva/sclera: Conjunctivae normal.     Pupils: Pupils are equal, round, and reactive to light.  Cardiovascular:     Rate and Rhythm: Normal rate and regular rhythm.     Heart sounds: No murmur.  Pulmonary:     Effort: Pulmonary effort is normal. No respiratory distress.     Breath sounds: Normal breath sounds.  Abdominal:     Palpations: Abdomen is soft.     Tenderness: There is no abdominal tenderness.  Musculoskeletal:     Cervical back: Neck supple.     Comments: Mild to moderate edema around right wrist.  Mild bruising.  Range of motion, sensation, pulses, strength intact.  Patient able to move fingers and make a fist freely  Skin:    General: Skin is warm and dry.  Neurological:     Mental Status: She is alert.     ED Results / Procedures / Treatments   Labs (all labs ordered are listed, but only abnormal results are displayed) Labs Reviewed - No data to display  EKG None  Radiology DG Wrist Complete Right  Result Date: 01/10/2020 CLINICAL DATA:  [place], right wrist pain EXAM: RIGHT WRIST - COMPLETE 3+ VIEW COMPARISON:  None. FINDINGS: Frontal, oblique, lateral, and ulnar deviated views of the right wrist are obtained. There is an impacted fracture of the distal right radial metaphysis with slight dorsal angulation. No intra-articular extension. Moderate osteoarthritis at the first and second carpometacarpal joints. Diffuse soft tissue edema. IMPRESSION: 1. Minimally impacted extra-articular distal right radial fracture. 2. Osteoarthritis. Electronically Signed   By: Randa Ngo M.D.   On: 01/10/2020 19:10   CT Head Wo Contrast  Result Date: 01/10/2020 CLINICAL DATA:  Head  trauma, minor. Additional history provided: Patient fell and hit head on pavement, denies loss of consciousness, not taking blood thinners, laceration over left eyebrow. EXAM: CT HEAD WITHOUT CONTRAST CT CERVICAL SPINE WITHOUT CONTRAST TECHNIQUE: Multidetector CT imaging of the head and cervical spine was performed following the standard protocol without intravenous contrast. Multiplanar CT image reconstructions of the cervical spine were also generated. COMPARISON:  Head CT 06/09/2018, CT cervical spine 05/22/2018. FINDINGS: CT HEAD FINDINGS Brain: There is no evidence of acute intracranial hemorrhage, intracranial mass, midline shift or extra-axial fluid collection.No demarcated cortical infarction. Moderate generalized parenchymal atrophy. Vascular: No hyperdense vessel.  Atherosclerotic calcifications. Skull: Normal. Negative for fracture or focal lesion. Sinuses/Orbits: Visualized orbits demonstrate no acute abnormality. Postsurgical appearance of the paranasal sinuses. Mild mucosal thickening within the ethmoid and visualized right maxillary sinus. No significant mastoid effusion. Other: Left forehead scalp hematoma. CT CERVICAL SPINE FINDINGS Alignment: Straightening of the expected cervical lordosis. Trace C4-C5 anterolisthesis Skull base and vertebrae: The basion-dental and atlanto-dental intervals are maintained.No evidence of acute fracture to the cervical spine. Soft tissues and spinal canal: No prevertebral fluid or swelling. No visible canal hematoma. Disc levels: Mild for age cervical spondylosis without significant bony spinal canal narrowing. Upper chest: No consolidation within the imaged lung apices. No visible pneumothorax. IMPRESSION: CT head: 1. No evidence of acute intracranial abnormality. Left forehead scalp hematoma. 2. Moderate generalized parenchymal atrophy. 3. Mild ethmoid and right maxillary sinus mucosal thickening at the imaged levels. CT cervical spine: 1. No evidence of acute  fracture to the cervical spine. 2. Cervical spondylosis without significant bony spinal canal narrowing. 3. Trace C4-C5 degenerative grade 1 anterolisthesis. Electronically Signed  By: Kellie Simmering DO   On: 01/10/2020 19:06   CT Cervical Spine Wo Contrast  Result Date: 01/10/2020 CLINICAL DATA:  Head trauma, minor. Additional history provided: Patient fell and hit head on pavement, denies loss of consciousness, not taking blood thinners, laceration over left eyebrow. EXAM: CT HEAD WITHOUT CONTRAST CT CERVICAL SPINE WITHOUT CONTRAST TECHNIQUE: Multidetector CT imaging of the head and cervical spine was performed following the standard protocol without intravenous contrast. Multiplanar CT image reconstructions of the cervical spine were also generated. COMPARISON:  Head CT 06/09/2018, CT cervical spine 05/22/2018. FINDINGS: CT HEAD FINDINGS Brain: There is no evidence of acute intracranial hemorrhage, intracranial mass, midline shift or extra-axial fluid collection.No demarcated cortical infarction. Moderate generalized parenchymal atrophy. Vascular: No hyperdense vessel.  Atherosclerotic calcifications. Skull: Normal. Negative for fracture or focal lesion. Sinuses/Orbits: Visualized orbits demonstrate no acute abnormality. Postsurgical appearance of the paranasal sinuses. Mild mucosal thickening within the ethmoid and visualized right maxillary sinus. No significant mastoid effusion. Other: Left forehead scalp hematoma. CT CERVICAL SPINE FINDINGS Alignment: Straightening of the expected cervical lordosis. Trace C4-C5 anterolisthesis Skull base and vertebrae: The basion-dental and atlanto-dental intervals are maintained.No evidence of acute fracture to the cervical spine. Soft tissues and spinal canal: No prevertebral fluid or swelling. No visible canal hematoma. Disc levels: Mild for age cervical spondylosis without significant bony spinal canal narrowing. Upper chest: No consolidation within the imaged lung  apices. No visible pneumothorax. IMPRESSION: CT head: 1. No evidence of acute intracranial abnormality. Left forehead scalp hematoma. 2. Moderate generalized parenchymal atrophy. 3. Mild ethmoid and right maxillary sinus mucosal thickening at the imaged levels. CT cervical spine: 1. No evidence of acute fracture to the cervical spine. 2. Cervical spondylosis without significant bony spinal canal narrowing. 3. Trace C4-C5 degenerative grade 1 anterolisthesis. Electronically Signed   By: Kellie Simmering DO   On: 01/10/2020 19:06   DG Knee Complete 4 Views Left  Result Date: 01/10/2020 CLINICAL DATA:  Fall.  Knee pain, swelling EXAM: LEFT KNEE - COMPLETE 4+ VIEW COMPARISON:  10/26/2018 FINDINGS: Anterior soft tissue swelling. Lucency noted along the inferior pole of the of the patella on the lateral view which is a change in appearance since prior study. Findings concerning for inferior patellar nondisplaced fracture. No joint effusion. No subluxation or dislocation. IMPRESSION: Anterior soft tissue swelling. Findings concerning for nondisplaced fracture through the inferior pole of the patella. Electronically Signed   By: Rolm Baptise M.D.   On: 01/10/2020 19:14    Procedures Procedures (including critical care time)  Medications Ordered in ED Medications - No data to display  ED Course  I have reviewed the triage vital signs and the nursing notes.  Pertinent labs & imaging results that were available during my care of the patient were reviewed by me and considered in my medical decision making (see chart for details).  Clinical Course as of Jan 10 2055  Wed Jan 10, 2020  1947 X-ray of knee shows findings concerning for nondisplaced fracture through the inferior pole of the patella.  Wrist x-ray shows minimally impacted extra-articular distal right radial fracture.  CT of head and neck negative for acute fractures.  Will place a brace on left knee and splint right wrist.  For abrasions appear to be  skin tears and do not require suturing.  Will have patient attempt to ambulate with brace with nursing before discharge.  She follows with Dr. Ninfa Linden with [facility].  We will have her follow-up with him in  a few days.     [MB]    Clinical Course User Index [MB] Lyndel Safe   MDM Rules/Calculators/A&P                     79 year old female with a history of hypertension anxiety presents to the ER after a fall. Patient is a hypertensive, heart rate mildly low but this appears to be at patient's baseline.  She appears comfortable, in no acute distress.  Notable laceration on her left forehead, nose bridge and left cheek.  Her lacerations do not seem deep and I do not suspect the need for sutures.  No obvious facial fractures or deformities.  Right wrist slightly edematous, mild bruising, mild tenderness to palpation with no appreciable open fractures.  Left knee mildly edematous with a noted scab.  Patella is not seem displaced. I will order a CT of head and C-spine, x-ray of right wrist and left knee.  Patient states she is in no acute pain and does not require pain management at this time.  X-ray of knee shows findings concerning for nondisplaced fracture through the inferior pole of the patella.  Wrist x-ray shows minimally impacted extra-articular distal right radial facture. At this point these are not emergent and can be evaluated as an outpatient. CT of head and neck negative for acute fracture or intracranial bleed.  Left forehead scalp hematoma noted around the area of her laceration.  Her lacerations appear to be tears and are superficial,  suturing is not indicated at this time.  We will have nursing staff clean and dress the wounds.   Patient's right wrist placed in a sugar tong splint along with  left knee immobilizer. Nursing ambulated patient with walker with no issues.  Patient follows with Dr. Ninfa Linden with [facility], will have her follow-up with him as an  outpatient. At this point in the ED course the patient is stable for discharge.  Will send Tylenol for pain.  Stressed outpatient follow-up.  Return precautions given.  Patient voices understanding and is agreeable with this plan.  Patient was evaluated by Dr. Roderic Palau and he agrees with the above plan.   Final Clinical Impression(s) / ED Diagnoses Final diagnoses:  Fall, initial encounter  Closed fracture of right wrist, initial encounter  Closed nondisplaced fracture of left patella, unspecified fracture morphology, initial encounter    Rx / DC Orders ED Discharge Orders         Ordered    acetaminophen (TYLENOL) 500 MG tablet  Every 6 hours PRN     01/10/20 2025           Lyndel Safe 01/10/20 2056    Milton Ferguson, MD 01/10/20 2114

## 2020-01-11 ENCOUNTER — Encounter: Payer: Self-pay | Admitting: Orthopaedic Surgery

## 2020-01-11 ENCOUNTER — Ambulatory Visit (INDEPENDENT_AMBULATORY_CARE_PROVIDER_SITE_OTHER): Payer: Medicare Other | Admitting: Orthopaedic Surgery

## 2020-01-11 DIAGNOSIS — S52531A Colles' fracture of right radius, initial encounter for closed fracture: Secondary | ICD-10-CM

## 2020-01-11 DIAGNOSIS — S82092A Other fracture of left patella, initial encounter for closed fracture: Secondary | ICD-10-CM | POA: Diagnosis not present

## 2020-01-11 MED ORDER — ACETAMINOPHEN-CODEINE #3 300-30 MG PO TABS: 1.0000 | ORAL_TABLET | Freq: Three times a day (TID) | ORAL | 0 refills | Status: DC | PRN

## 2020-01-11 NOTE — Progress Notes (Signed)
Office Visit Note   Patient: Linda [name]           Date of Birth: 1941/10/02           MRN: [id number] Visit Date: 01/11/2020              Requested by: Crist Infante, MD [address] [place],  [place] [id number] PCP: Crist Infante, MD   Assessment & Plan: Visit Diagnoses:  1. Closed Colles' fracture of right radius, initial encounter   2. Other closed fracture of left patella, initial encounter     Plan: Both of these fractures can be treated nonoperatively.  We will need to keep her in the splint for the next 10 days to allow for soft tissue healing to subside and swelling to subside.  When we see her back in 10 days I would like a repeat 2 views of the right wrist out of her splint.  I will also like 2 views of the left knee out of any splint done supine.  She can weight-bear as tolerated.  I will send in some Tylenol 3 for pain.  All question concerns were answered and addressed.  Follow-Up Instructions: Return in about 10 days (around 01/21/2020).   Orders:  No orders of the defined types were placed in this encounter.  Meds ordered this encounter  Medications  . acetaminophen-codeine (TYLENOL #3) 300-30 MG tablet    Sig: Take 1-2 tablets by mouth every 8 (eight) hours as needed for moderate pain.    Dispense:  30 tablet    Refill:  0      Procedures: No procedures performed   Clinical Data: No additional findings.   Subjective: Chief Complaint  Patient presents with  . Left Knee - Injury, Pain  . Right Wrist - Pain, Injury  Patient is brought into clinic today after sustaining a significant mechanical fall yesterday.  She slipped on rocks at a park.  She injured her left knee and her right wrist.  She was seen at outlying emergency room.  She was found to have an inferior pole the patella fracture of the left knee was placed in the immobilizer.  She also has a distal radius fracture of her right wrist and was placed in a splint.  They did have to CT scan her  head and neck.  She does report port significant bruising of her skin head and face.  She fell directly landing on her face as well.  She denies any numbness and tingling in her feet or her hands.  She is only taking some Aleve for pain but does need something a little stronger.  Her husband is with her today.  She cannot tolerate the knee immobilizer so he put her into a hinged knee brace that he had at home.  She is not a smoker and not a diabetic.  HPI  Review of Systems She currently denies any headache, chest pain, shortness of breath, fever, chills, nausea, vomiting.  She does report some face pain but not a true headache.  She also reports some neck pain.  Objective: Vital Signs: There were no vitals taken for this visit.  Physical Exam She is alert and orient x3 and in some discomfort but no acute distress Ortho Exam Examination of her neck shows she has no step-off to palpation with good range of motion of the neck with only some stiffness in the paraspinal muscles.  Examination of her right wrist shows she has an intact  splint.  She is able to move her fingers and thumb and they are well perfused with normal sensation.  Examination of her left knee does show some bruising.  Her extensor mechanism is intact. Specialty Comments:  No specialty comments available.  Imaging: DG Wrist Complete Right  Result Date: 01/10/2020 CLINICAL DATA:  [place], right wrist pain EXAM: RIGHT WRIST - COMPLETE 3+ VIEW COMPARISON:  None. FINDINGS: Frontal, oblique, lateral, and ulnar deviated views of the right wrist are obtained. There is an impacted fracture of the distal right radial metaphysis with slight dorsal angulation. No intra-articular extension. Moderate osteoarthritis at the first and second carpometacarpal joints. Diffuse soft tissue edema. IMPRESSION: 1. Minimally impacted extra-articular distal right radial fracture. 2. Osteoarthritis. Electronically Signed   By: Randa Ngo M.D.   On:  01/10/2020 19:10   CT Head Wo Contrast  Result Date: 01/10/2020 CLINICAL DATA:  Head trauma, minor. Additional history provided: Patient fell and hit head on pavement, denies loss of consciousness, not taking blood thinners, laceration over left eyebrow. EXAM: CT HEAD WITHOUT CONTRAST CT CERVICAL SPINE WITHOUT CONTRAST TECHNIQUE: Multidetector CT imaging of the head and cervical spine was performed following the standard protocol without intravenous contrast. Multiplanar CT image reconstructions of the cervical spine were also generated. COMPARISON:  Head CT 06/09/2018, CT cervical spine 05/22/2018. FINDINGS: CT HEAD FINDINGS Brain: There is no evidence of acute intracranial hemorrhage, intracranial mass, midline shift or extra-axial fluid collection.No demarcated cortical infarction. Moderate generalized parenchymal atrophy. Vascular: No hyperdense vessel.  Atherosclerotic calcifications. Skull: Normal. Negative for fracture or focal lesion. Sinuses/Orbits: Visualized orbits demonstrate no acute abnormality. Postsurgical appearance of the paranasal sinuses. Mild mucosal thickening within the ethmoid and visualized right maxillary sinus. No significant mastoid effusion. Other: Left forehead scalp hematoma. CT CERVICAL SPINE FINDINGS Alignment: Straightening of the expected cervical lordosis. Trace C4-C5 anterolisthesis Skull base and vertebrae: The basion-dental and atlanto-dental intervals are maintained.No evidence of acute fracture to the cervical spine. Soft tissues and spinal canal: No prevertebral fluid or swelling. No visible canal hematoma. Disc levels: Mild for age cervical spondylosis without significant bony spinal canal narrowing. Upper chest: No consolidation within the imaged lung apices. No visible pneumothorax. IMPRESSION: CT head: 1. No evidence of acute intracranial abnormality. Left forehead scalp hematoma. 2. Moderate generalized parenchymal atrophy. 3. Mild ethmoid and right maxillary sinus  mucosal thickening at the imaged levels. CT cervical spine: 1. No evidence of acute fracture to the cervical spine. 2. Cervical spondylosis without significant bony spinal canal narrowing. 3. Trace C4-C5 degenerative grade 1 anterolisthesis. Electronically Signed   By: Kellie Simmering DO   On: 01/10/2020 19:06   CT Cervical Spine Wo Contrast  Result Date: 01/10/2020 CLINICAL DATA:  Head trauma, minor. Additional history provided: Patient fell and hit head on pavement, denies loss of consciousness, not taking blood thinners, laceration over left eyebrow. EXAM: CT HEAD WITHOUT CONTRAST CT CERVICAL SPINE WITHOUT CONTRAST TECHNIQUE: Multidetector CT imaging of the head and cervical spine was performed following the standard protocol without intravenous contrast. Multiplanar CT image reconstructions of the cervical spine were also generated. COMPARISON:  Head CT 06/09/2018, CT cervical spine 05/22/2018. FINDINGS: CT HEAD FINDINGS Brain: There is no evidence of acute intracranial hemorrhage, intracranial mass, midline shift or extra-axial fluid collection.No demarcated cortical infarction. Moderate generalized parenchymal atrophy. Vascular: No hyperdense vessel.  Atherosclerotic calcifications. Skull: Normal. Negative for fracture or focal lesion. Sinuses/Orbits: Visualized orbits demonstrate no acute abnormality. Postsurgical appearance of the paranasal sinuses. Mild mucosal thickening within the  ethmoid and visualized right maxillary sinus. No significant mastoid effusion. Other: Left forehead scalp hematoma. CT CERVICAL SPINE FINDINGS Alignment: Straightening of the expected cervical lordosis. Trace C4-C5 anterolisthesis Skull base and vertebrae: The basion-dental and atlanto-dental intervals are maintained.No evidence of acute fracture to the cervical spine. Soft tissues and spinal canal: No prevertebral fluid or swelling. No visible canal hematoma. Disc levels: Mild for age cervical spondylosis without significant  bony spinal canal narrowing. Upper chest: No consolidation within the imaged lung apices. No visible pneumothorax. IMPRESSION: CT head: 1. No evidence of acute intracranial abnormality. Left forehead scalp hematoma. 2. Moderate generalized parenchymal atrophy. 3. Mild ethmoid and right maxillary sinus mucosal thickening at the imaged levels. CT cervical spine: 1. No evidence of acute fracture to the cervical spine. 2. Cervical spondylosis without significant bony spinal canal narrowing. 3. Trace C4-C5 degenerative grade 1 anterolisthesis. Electronically Signed   By: Kellie Simmering DO   On: 01/10/2020 19:06   DG Knee Complete 4 Views Left  Result Date: 01/10/2020 CLINICAL DATA:  Fall.  Knee pain, swelling EXAM: LEFT KNEE - COMPLETE 4+ VIEW COMPARISON:  10/26/2018 FINDINGS: Anterior soft tissue swelling. Lucency noted along the inferior pole of the of the patella on the lateral view which is a change in appearance since prior study. Findings concerning for inferior patellar nondisplaced fracture. No joint effusion. No subluxation or dislocation. IMPRESSION: Anterior soft tissue swelling. Findings concerning for nondisplaced fracture through the inferior pole of the patella. Electronically Signed   By: Rolm Baptise M.D.   On: 01/10/2020 19:14   Independent review of x-rays of her left knee show a nondisplaced inferior pole patella fracture there is incomplete fracture.  X-rays of her right wrist show an extra-articular distal radius fracture with anatomic alignment.  There is no displacement.  PMFS History: Patient Active Problem List   Diagnosis Date Noted  . Acute right-sided low back pain with right-sided sciatica 06/13/2018  . Hypertension   . High cholesterol   . Anxiety    Past Medical History:  Diagnosis Date  . Anxiety   . Candida esophagitis ([place])   . Chronic kidney disease   . DDD (degenerative disc disease)   . Depression   . Erosive gastritis   . Falls   . GERD (gastroesophageal  reflux disease)   . Hiatal hernia   . High cholesterol   . History of anal fissures   . Hypertension   . Obesity   . Osteoarthritis   . Panic disorder   . Sleep apnea    Does not need CPAP anymore    Family History  Problem Relation Age of Onset  . Heart disease Mother   . Lung cancer Father   . Diabetes Sister   . Hypertension Sister   . Diabetes Brother   . Heart disease Brother   . Heart attack Brother   . Ovarian cancer Sister   . Heart disease Brother   . Heart attack Brother   . Heart disease Brother   . Heart attack Brother   . Colon cancer Neg Hx     Past Surgical History:  Procedure Laterality Date  . ABDOMINAL HYSTERECTOMY  1987   LSO / menorrhagia & leiomyomata  . APPENDECTOMY    . BREAST EXCISIONAL BIOPSY Left 1989   benign  . BREAST SURGERY  1989   benign tumor left breast  . CHOLECYSTECTOMY  2001  . TUBAL LIGATION     Social History   Occupational History  .  Occupation: Retired   Tobacco Use  . Smoking status: Never Smoker  . Smokeless tobacco: Never Used  Substance and Sexual Activity  . Alcohol use: No  . Drug use: No  . Sexual activity: Yes

## 2020-01-13 IMAGING — CT CT CERVICAL SPINE W/O CM
4 of 7 series · 13 of 33 positions shown, 15 images · non-contrast
Comparison: 11/01/2010

CLINICAL DATA: Pt walking up steps at church, does not know what
happened but fell backwards hitting back of head, C collar placed in
triage. Husband states she fell twice last month as if she looses
her balance. Unsure about LOC

EXAM:
CT HEAD WITHOUT CONTRAST
CT CERVICAL SPINE WITHOUT CONTRAST
TECHNIQUE: Multidetector CT imaging of the head and cervical spine was
performed following the standard protocol without intravenous
contrast. Multiplanar CT image reconstructions of the cervical spine
were also generated.

[Series 6: c spine soft · axial · 0.33mm/px · z∈[-288,-204]mm · 3 of 85 slices shown]
[im 22/85  soft-tissue]
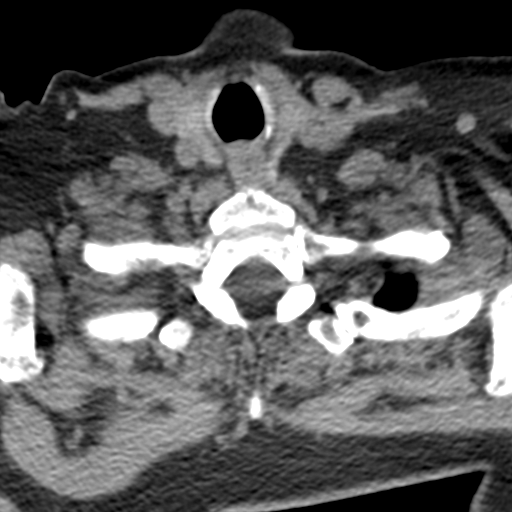
[im 43/85  soft-tissue]
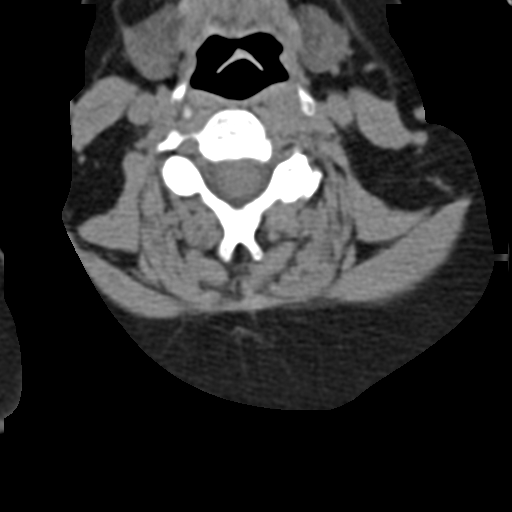
[im 64/85  soft-tissue]
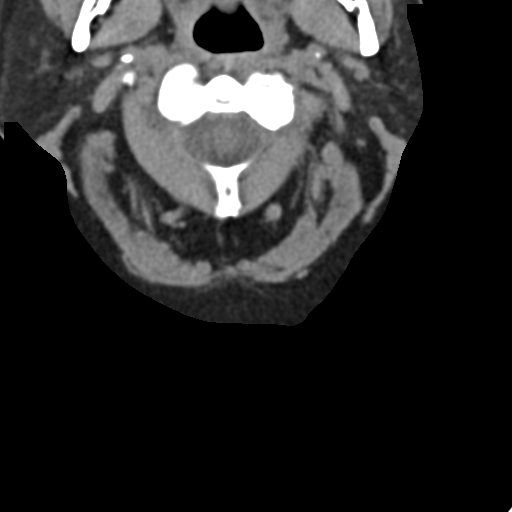

[Series 11: orthogonal bone · axial · 0.23mm/px · z∈[-327,-194]mm · 5 of 102 slices shown, 7 images]
[im 17/102  soft-tissue]
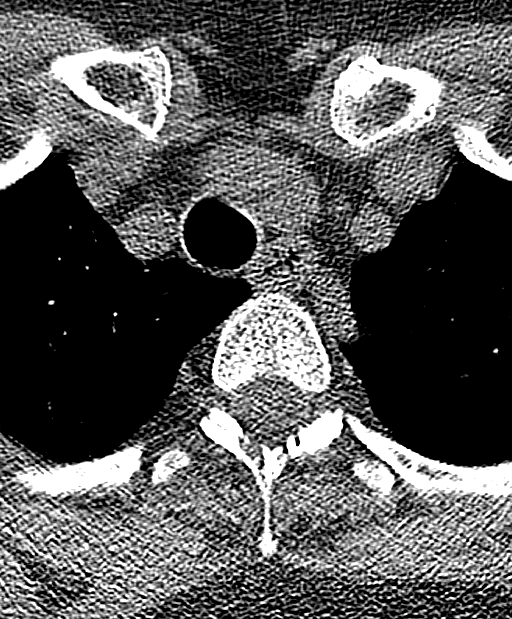
[im 17/102  bone]
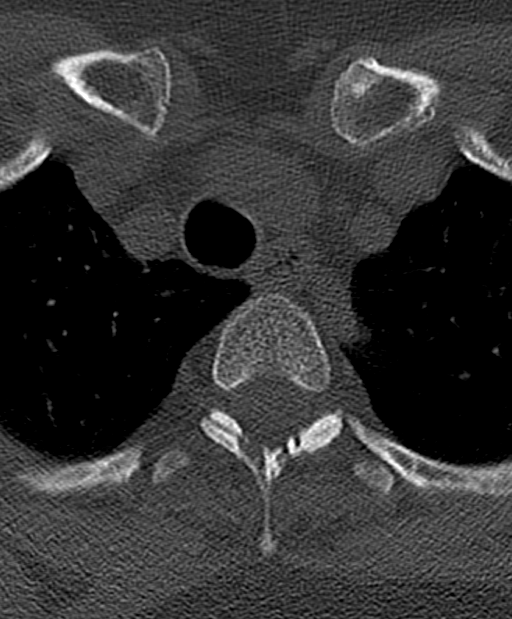
[im 34/102  bone]
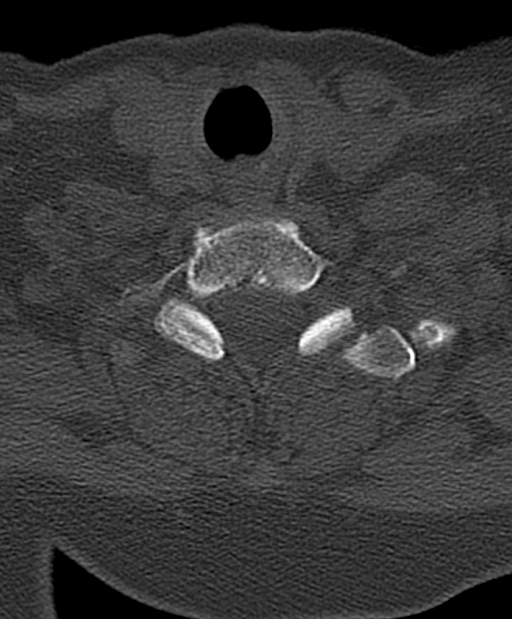
[im 51/102  bone]
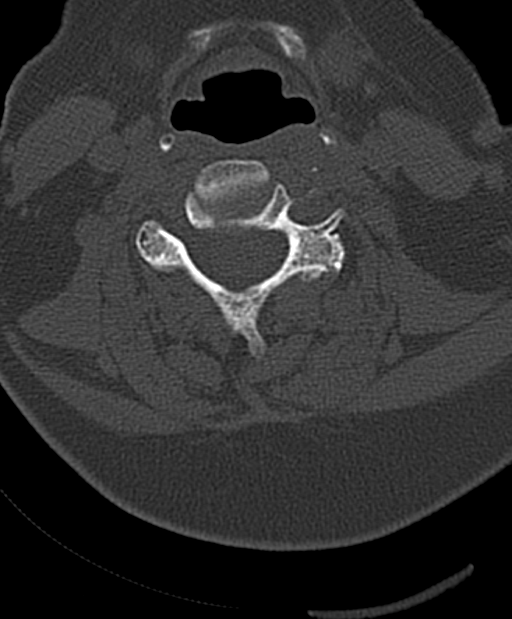
[im 68/102  bone]
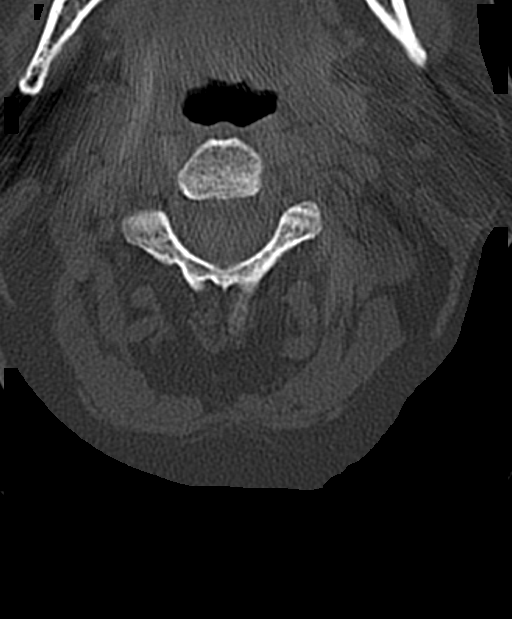
[im 85/102  soft-tissue]
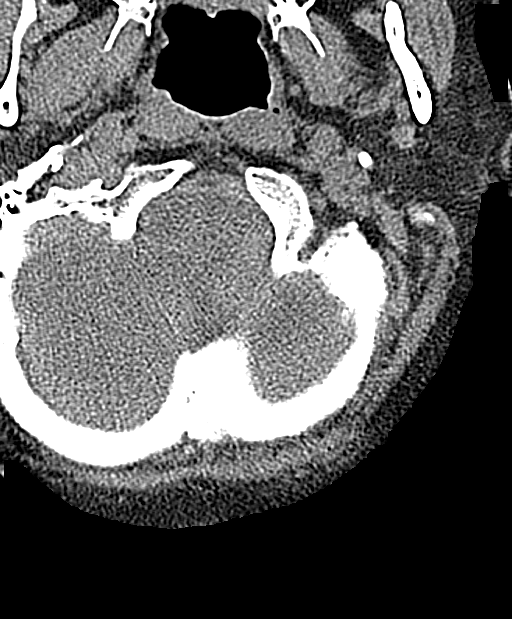
[im 85/102  bone]
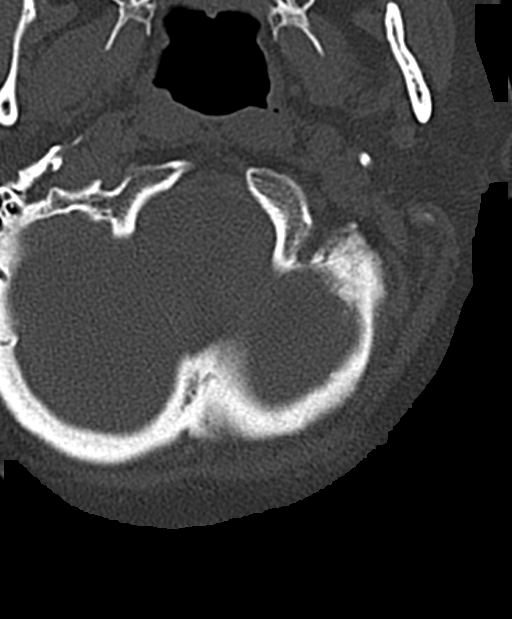

[Series 12: coronal bone · coronal · 0.33mm/px · 1 of 65 slices shown]
[im 33/65  bone]
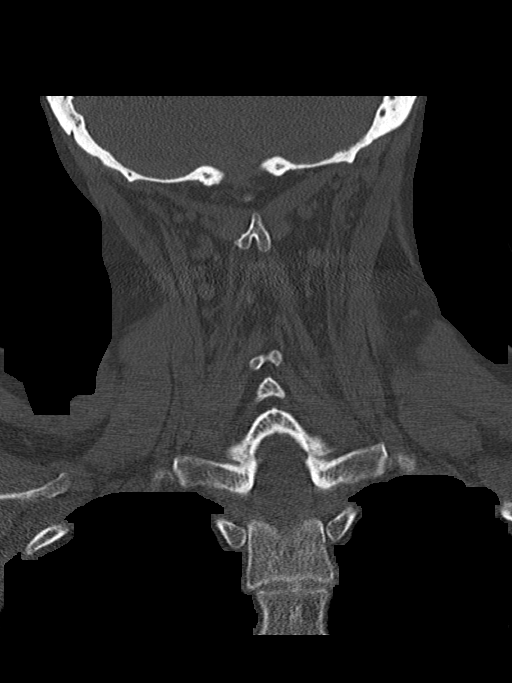

[Series 13: sagittal bone · sagittal · 0.33mm/px · 4 of 61 slices shown]
[im 13/61  bone]
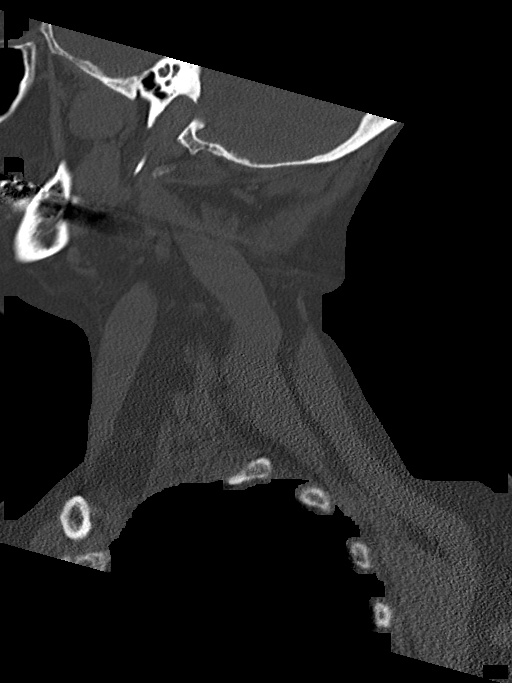
[im 25/61  bone]
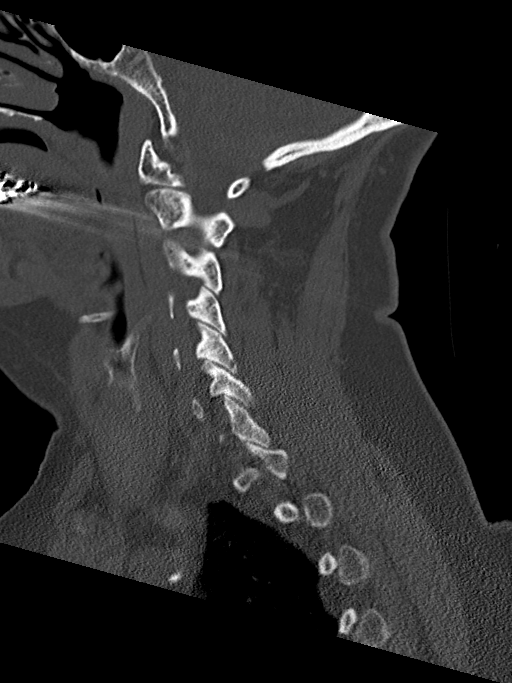
[im 37/61  bone]
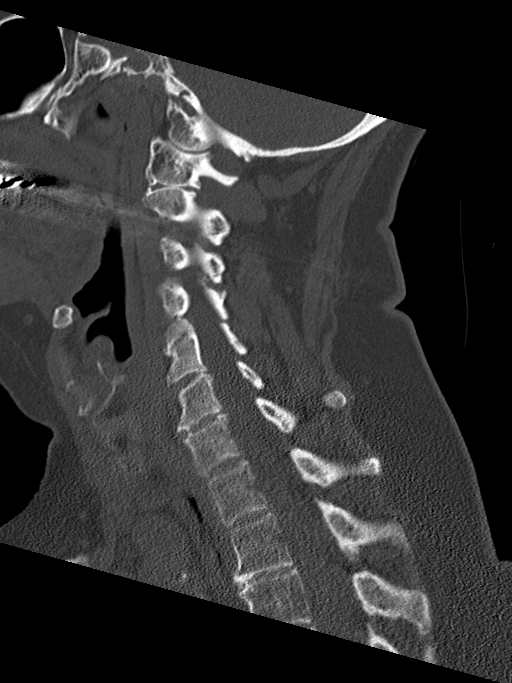
[im 49/61  bone]
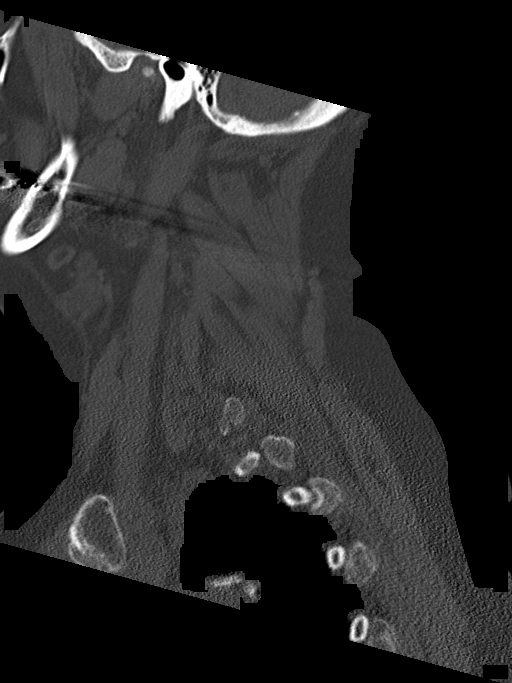

[13 of 33 positions shown; findings below may reference images not displayed]

FINDINGS: CT HEAD FINDINGS

Brain: There is central and cortical atrophy. Periventricular white
matter changes are consistent with small vessel disease. There is no
intra or extra-axial fluid collection or mass lesion. The basilar
cisterns and ventricles have a normal appearance. There is no CT
evidence for acute infarction or hemorrhage.

Vascular: No hyperdense vessels. There is atherosclerotic
calcifications of the carotic siphons.

Skull: Normal. Negative for fracture or focal lesion.

Sinuses/Orbits: No acute finding.

Other: None

CT CERVICAL SPINE FINDINGS

Alignment: Vertebral bodies are normally aligned with preservation
of the normal cervical lordosis.

Skull base and vertebrae: No acute fracture. There is a lucent
lesion along the LEFT aspect of the vertebral body of T3, not imaged
on the previous exam. This measures 7 millimeters in diameter and is
not further characterized.

Soft tissues and spinal canal: No prevertebral fluid or swelling. No
visible canal hematoma.

Disc levels:  There is mild disc height loss at C6-7 and C7-T1.

Upper chest: Negative.

Other: None
IMPRESSION: 1. Central cortical atrophy.
2.  No evidence for acute intracranial abnormality.
3.  No evidence for acute cervical spine fracture.
4. Lytic lesion within the vertebral body of T3 warranting further
evaluation. Considerations include metastatic disease or benign
lesion. Further characterization with outpatient MRI is recommended.

## 2020-01-22 ENCOUNTER — Encounter: Payer: Self-pay | Admitting: Orthopaedic Surgery

## 2020-01-22 ENCOUNTER — Ambulatory Visit: Payer: Self-pay

## 2020-01-22 ENCOUNTER — Other Ambulatory Visit: Payer: Self-pay

## 2020-01-22 ENCOUNTER — Ambulatory Visit (INDEPENDENT_AMBULATORY_CARE_PROVIDER_SITE_OTHER): Payer: Medicare Other | Admitting: Orthopaedic Surgery

## 2020-01-22 ENCOUNTER — Ambulatory Visit (INDEPENDENT_AMBULATORY_CARE_PROVIDER_SITE_OTHER): Payer: Medicare Other

## 2020-01-22 DIAGNOSIS — S82092A Other fracture of left patella, initial encounter for closed fracture: Secondary | ICD-10-CM

## 2020-01-22 DIAGNOSIS — S52531A Colles' fracture of right radius, initial encounter for closed fracture: Secondary | ICD-10-CM | POA: Diagnosis not present

## 2020-01-22 NOTE — Progress Notes (Signed)
The patient is now 10 days out from a mechanical fall in which she sustained an extra-articular distal radius fracture of the right wrist and an inferior pole patella fracture of the left knee.  We have had her in just a knee brace on the left knee allowing her to bend her knee.  She has been in a plaster splint on her right wrist due to swelling.  She reports that she is doing well overall.  She said the wrist is still pretty painful first thing in the morning and she is only taken Tylenol 3 on occasion.  Her husband is with her today.  She denies any numbness and tingling in her right hand.  On examination out of the splint on the right wrist there is some bruising but the swelling is minimal.  Clinically her alignment appears well-maintained.  She is able to move her fingers and thumb and her hand is well-perfused with normal sensation.  Examination of her left knee does show bruising.  Her extensor mechanism is intact.  She is able to flex and extend her knee without difficulty on the left side.  2 views of the left knee show a nondisplaced inferior pole patella fracture that is minimal.  2 views of the right wrist show a nondisplaced distal radius fracture that is extra-articular.  There is only just slight shortening and neutral angulation on the lateral view.  I am comfortable treating her right wrist with just a Velcro wrist splint with her knowing that she should not lift any thing heavy with that right wrist and she should remain in the splint more times than not.  She can stop any type of knee brace on the left knee.  I would like to see her back in 4 weeks to see how she is doing overall.  At that visit I would like an AP and lateral of the right wrist only.  We do not need to x-ray the left knee unless there are issues with the knee.  She understands that both of these will heal with time.  All question concerns were answered and addressed.  We will see her back in 4 weeks.

## 2020-02-19 ENCOUNTER — Ambulatory Visit (INDEPENDENT_AMBULATORY_CARE_PROVIDER_SITE_OTHER): Payer: Medicare Other

## 2020-02-19 ENCOUNTER — Ambulatory Visit (INDEPENDENT_AMBULATORY_CARE_PROVIDER_SITE_OTHER): Payer: Medicare Other | Admitting: Orthopaedic Surgery

## 2020-02-19 ENCOUNTER — Encounter: Payer: Self-pay | Admitting: Orthopaedic Surgery

## 2020-02-19 ENCOUNTER — Ambulatory Visit: Payer: Medicare Other | Admitting: Orthopedic Surgery

## 2020-02-19 ENCOUNTER — Other Ambulatory Visit: Payer: Self-pay

## 2020-02-19 DIAGNOSIS — S52531A Colles' fracture of right radius, initial encounter for closed fracture: Secondary | ICD-10-CM | POA: Diagnosis not present

## 2020-02-19 NOTE — Progress Notes (Signed)
The patient is now about 5 weeks into a extra-articular right distal radius fracture as well as a nondisplaced left inferior pole patella fracture.  We have been treating these nonoperative.  We already stopped her knee brace on her left side.  She has been wearing a Velcro wrist splint on the right side.  She says she is doing better overall and has just some pain when using the wrist or turning her wrist but no swelling.  On examination of her right wrist the swelling is minimal.  She has slightly weak grip strength comparing the left and right sides.  She can almost fully supinate her wrist at this standpoint and her pronation is full.  She has better flexion extension of the wrist.  She is able to extend her left knee easily on her own and has no pain to palpation over the patella.  This was a minimal fracture is patella standpoint.  2 views of the right wrist are obtained and show an extra-articular distal radius fracture with some interval healing.  There is slight volar angulation.  This point she is having a very good outcome based on her fracture pattern.  She is active 79 year old female.  She will still avoid heavy lifting with that wrist and I would like her to come in and out of the wrist splint as comfort allows.  We will see her back for final visit in 4 weeks with a final AP and lateral of the right wrist.

## 2020-03-19 ENCOUNTER — Encounter: Payer: Self-pay | Admitting: Orthopaedic Surgery

## 2020-03-19 ENCOUNTER — Ambulatory Visit (INDEPENDENT_AMBULATORY_CARE_PROVIDER_SITE_OTHER): Payer: Medicare Other

## 2020-03-19 ENCOUNTER — Other Ambulatory Visit: Payer: Self-pay

## 2020-03-19 ENCOUNTER — Ambulatory Visit (INDEPENDENT_AMBULATORY_CARE_PROVIDER_SITE_OTHER): Payer: Medicare Other | Admitting: Orthopaedic Surgery

## 2020-03-19 DIAGNOSIS — S52531A Colles' fracture of right radius, initial encounter for closed fracture: Secondary | ICD-10-CM

## 2020-03-19 DIAGNOSIS — S52531D Colles' fracture of right radius, subsequent encounter for closed fracture with routine healing: Secondary | ICD-10-CM

## 2020-03-19 NOTE — Progress Notes (Signed)
The patient is a 79 year old female who is between 9 and 10 weeks out from a mechanical fall in which she sustained a right nondisplaced distal radius fracture that was extra-articular.  We have been treating this with first a cast and now removable wrist splint.  She says that there is some soreness of her wrist but overall she is doing well.  She has been compliant with wearing the Velcro wrist splint and coming out of occasionally to work on wrist motion.  On examination of her right wrist there is certainly stiffness with dorsiflexion and palmar flexion of the right wrist.  Her pronation and supination are almost full.  Her hand is well-perfused and neurovascularly intact.  2 views of the right distal radius reviewed.  The fracture does show signs of interval healing.  Is an extra-articular fracture.  There is slight dorsal angulation and loss of reduction from previous film comparison.  She is doing fine overall.  She will come in and out of the wrist splint as comfort allows.  She will still avoid heavy lifting with that wrist.  In 4 weeks from now she can stop the splint overall if she would like.  From the standpoint I would not recommend any other treatment.  I would like to see her back for final visit in about 3 months and have a repeat 2 views of the right wrist.  All questions and concerns were answered and addressed.

## 2020-03-25 DIAGNOSIS — K219 Gastro-esophageal reflux disease without esophagitis: Secondary | ICD-10-CM | POA: Diagnosis not present

## 2020-03-25 DIAGNOSIS — I10 Essential (primary) hypertension: Secondary | ICD-10-CM | POA: Diagnosis not present

## 2020-03-25 DIAGNOSIS — N1831 Chronic kidney disease, stage 3a: Secondary | ICD-10-CM | POA: Diagnosis not present

## 2020-03-25 DIAGNOSIS — F339 Major depressive disorder, recurrent, unspecified: Secondary | ICD-10-CM | POA: Diagnosis not present

## 2020-03-25 DIAGNOSIS — R296 Repeated falls: Secondary | ICD-10-CM | POA: Diagnosis not present

## 2020-03-25 DIAGNOSIS — R001 Bradycardia, unspecified: Secondary | ICD-10-CM | POA: Diagnosis not present

## 2020-05-09 ENCOUNTER — Other Ambulatory Visit: Payer: Self-pay | Admitting: Internal Medicine

## 2020-05-09 DIAGNOSIS — Z1231 Encounter for screening mammogram for malignant neoplasm of breast: Secondary | ICD-10-CM

## 2020-06-14 ENCOUNTER — Other Ambulatory Visit: Payer: Self-pay

## 2020-06-14 ENCOUNTER — Ambulatory Visit (INDEPENDENT_AMBULATORY_CARE_PROVIDER_SITE_OTHER): Payer: Medicare Other | Admitting: Internal Medicine

## 2020-06-14 ENCOUNTER — Encounter: Payer: Self-pay | Admitting: Internal Medicine

## 2020-06-14 VITALS — BP 128/72 | HR 82 | Temp 96.3°F | Ht 61.0 in | Wt 174.0 lb

## 2020-06-14 DIAGNOSIS — R079 Chest pain, unspecified: Secondary | ICD-10-CM | POA: Diagnosis not present

## 2020-06-14 DIAGNOSIS — Z79899 Other long term (current) drug therapy: Secondary | ICD-10-CM

## 2020-06-14 DIAGNOSIS — I10 Essential (primary) hypertension: Secondary | ICD-10-CM | POA: Diagnosis not present

## 2020-06-14 DIAGNOSIS — Z8249 Family history of ischemic heart disease and other diseases of the circulatory system: Secondary | ICD-10-CM

## 2020-06-14 DIAGNOSIS — E785 Hyperlipidemia, unspecified: Secondary | ICD-10-CM | POA: Diagnosis not present

## 2020-06-14 MED ORDER — ROSUVASTATIN CALCIUM 5 MG PO TABS: 5.0000 mg | ORAL_TABLET | Freq: Every day | ORAL | 3 refills | Status: DC

## 2020-06-14 NOTE — Patient Instructions (Signed)
Medication Instructions:  Stop taking Simvastatin 20 mg  Start taking Rosuvastatin 5 mg daily  *If you need a refill on your cardiac medications before your next appointment, please call your pharmacy*   Lab Work: Please have blood work drawn at primary care (CMP, Fasting Lipid).   Testing/Procedures: None   Follow-Up: At [facility], you and your health needs are our priority.  As part of our continuing mission to provide you with exceptional heart care, we have created designated Provider Care Teams.  These Care Teams include your primary Cardiologist (physician) and Advanced Practice Providers (APPs -  Physician Assistants and Nurse Practitioners) who all work together to provide you with the care you need, when you need it.  We recommend signing up for the patient portal called "MyChart".  Sign up information is provided on this After Visit Summary.  MyChart is used to connect with patients for Virtual Visits (Telemedicine).  Patients are able to view lab/test results, encounter notes, upcoming appointments, etc.  Non-urgent messages can be sent to your provider as well.   To learn more about what you can do with MyChart, go to [email or web address].    Your next appointment:   6 month(s)  The format for your next appointment:   In Person  Provider:   Cherlynn Kaiser, MD

## 2020-06-14 NOTE — Progress Notes (Signed)
Cardiology Office Note:    Date:  06/14/2020   ID:  Linda [name], DOB 1940-12-11, MRN [id number]  PCP:  Crist Infante, MD  Cardiologist:  Elouise Munroe, MD  Electrophysiologist:  None   Referring MD: Crist Infante, MD   Chief Complaint: chest pain, HTN, bradycardia, HLD  History of Present Illness:    Linda [name] is a 79 y.o. female with a history of obesity, hyperlipidemia, hypertension, GERD, OSA not requiring treatment given weight loss, and frequent falls. She presents for follow up.  Stopped bystolic, felt better, less tired and weak. I suspect bradycardia was the source. BP and pulse look better today.   Acid reflux getting better.   Furosemide prn. Spironolactone helping more.   No CP since last visit. No significant SOB.    Past Medical History:  Diagnosis Date  . Anxiety   . Candida esophagitis ([place])   . Chronic kidney disease   . DDD (degenerative disc disease)   . Depression   . Erosive gastritis   . Falls   . GERD (gastroesophageal reflux disease)   . Hiatal hernia   . High cholesterol   . History of anal fissures   . Hypertension   . Obesity   . Osteoarthritis   . Panic disorder   . Sleep apnea    Does not need CPAP anymore    Past Surgical History:  Procedure Laterality Date  . ABDOMINAL HYSTERECTOMY  1987   LSO / menorrhagia & leiomyomata  . APPENDECTOMY    . BREAST EXCISIONAL BIOPSY Left 1989   benign  . BREAST SURGERY  1989   benign tumor left breast  . CHOLECYSTECTOMY  2001  . TUBAL LIGATION      Current Medications: Current Meds  Medication Sig  . acetaminophen-codeine (TYLENOL #3) 300-30 MG tablet Take 1-2 tablets by mouth every 8 (eight) hours as needed for moderate pain.  Marland Kitchen ALPRAZolam (XANAX) 1 MG tablet Take 0.5-1 mg by mouth 3 (three) times daily as needed for anxiety.   . AMBULATORY NON FORMULARY MEDICATION FDGARD 2 capsules by mouth twice a day  . amLODipine (NORVASC) 10 MG tablet Take 10 mg by mouth daily.     . benazepril (LOTENSIN) 40 MG tablet Take 40 mg by mouth daily.    Marland Kitchen BIOTIN PO Take 1 tablet by mouth daily.  Marland Kitchen desvenlafaxine (PRISTIQ) 100 MG 24 hr tablet Take 100 mg by mouth daily.   Marland Kitchen dexlansoprazole (DEXILANT) 60 MG capsule Take 1 capsule (60 mg total) by mouth daily.  . diazepam (VALIUM) 5 MG tablet SMARTSIG:1 Tablet(s) By Mouth Every Evening  . famotidine (SM ACID REDUCER MAX ST) 20 MG tablet TAKE ONE TABLET (20MG  TOTAL) BY MOUTH ATBEDTIME  . lamoTRIgine (LAMICTAL) 200 MG tablet Take 200 mg by mouth 2 (two) times daily.   Marland Kitchen loratadine (CLARITIN) 10 MG tablet Take 10 mg by mouth daily.  . meclizine (ANTIVERT) 25 MG tablet Take 25 mg by mouth 3 (three) times daily as needed for dizziness.  . ondansetron (ZOFRAN) 8 MG tablet Take 1 tablet (8 mg total) by mouth every 8 (eight) hours as needed for nausea.  . simvastatin (ZOCOR) 20 MG tablet   . [DISCONTINUED] BYSTOLIC 5 MG tablet Take 5 mg by mouth daily.   Current Facility-Administered Medications for the 06/14/20 encounter (Office Visit) with Elouise Munroe, MD  Medication  . 0.9 %  sodium chloride infusion     Allergies:   Amoxicillin and Abilify [aripiprazole]  Social History   Socioeconomic History  . Marital status: Married    Spouse name: Not on file  . Number of children: 1  . Years of education: Not on file  . Highest education level: Not on file  Occupational History  . Occupation: Retired   Tobacco Use  . Smoking status: Never Smoker  . Smokeless tobacco: Never Used  Vaping Use  . Vaping Use: Never used  Substance and Sexual Activity  . Alcohol use: No  . Drug use: No  . Sexual activity: Yes  Other Topics Concern  . Not on file  Social History Narrative   Daily caffeine    Social Determinants of Health   Financial Resource Strain:   . Difficulty of Paying Living Expenses: Not on file  Food Insecurity:   . Worried About Charity fundraiser in the Last Year: Not on file  . Ran Out of Food in the  Last Year: Not on file  Transportation Needs:   . Lack of Transportation (Medical): Not on file  . Lack of Transportation (Non-Medical): Not on file  Physical Activity:   . Days of Exercise per Week: Not on file  . Minutes of Exercise per Session: Not on file  Stress:   . Feeling of Stress : Not on file  Social Connections:   . Frequency of Communication with Friends and Family: Not on file  . Frequency of Social Gatherings with Friends and Family: Not on file  . Attends Religious Services: Not on file  . Active Member of Clubs or Organizations: Not on file  . Attends Archivist Meetings: Not on file  . Marital Status: Not on file     Family History: The patient's family history includes Diabetes in her brother and sister; Heart attack in her brother, brother, and brother; Heart disease in her brother, brother, brother, and mother; Hypertension in her sister; Lung cancer in her father; Ovarian cancer in her sister. There is no history of Colon cancer.  ROS:   Please see the history of present illness.    All other systems reviewed and are negative.  EKGs/Labs/Other Studies Reviewed:    The following studies were reviewed today:  Recent Labs: 10/30/2019: BUN 16; Creatinine, Ser 1.03; Potassium 4.7; Sodium 141  Recent Lipid Panel No results found for: CHOL, TRIG, HDL, CHOLHDL, VLDL, LDLCALC, LDLDIRECT  Physical Exam:    VS:  BP 128/72   Pulse 82   Temp (!) 96.3 F (35.7 C)   Ht 5\' 1"  (1.549 m)   Wt 174 lb (78.9 kg)   SpO2 96%   BMI 32.88 kg/m     Wt Readings from Last 5 Encounters:  06/14/20 174 lb (78.9 kg)  01/10/20 180 lb (81.6 kg)  12/11/19 178 lb 12.8 oz (81.1 kg)  11/13/19 177 lb (80.3 kg)  11/08/19 177 lb 9.6 oz (80.6 kg)     Constitutional: No acute distress Eyes: sclera non-icteric, normal conjunctiva and lids ENMT: normal dentition, moist mucous membranes Cardiovascular: regular rhythm, normal rate, no murmurs. S1 and S2 normal. Radial pulses  normal bilaterally. No jugular venous distention.  Respiratory: clear to auscultation bilaterally GI : normal bowel sounds, soft and nontender. No distention.   MSK: extremities warm, well perfused. No edema.  NEURO: grossly nonfocal exam, moves all extremities. PSYCH: alert and oriented x 3, normal mood and affect.   ASSESSMENT:    1. Chest pain, unspecified type   2. Hypertension, unspecified type   3. Medication  management   4. Hyperlipidemia, unspecified hyperlipidemia type   5. Family history of premature CAD    PLAN:    Chest pain - likely from reflux. Reflux symptoms improving.   HTN - stopped bystolic, BP still well controlled on spironolactone, prn lasix, amlodipine, benazepril. Continue at current doses.  Bradycardia - resolved after stopping bystolic.   HLD - transition to crestor 5 mg daily, recheck lipids in 3 mo and titrate as needed. LDL goal is <70.    Total time of encounter: 30 minutes total time of encounter, including 25 minutes spent in face-to-face patient care on the date of this encounter. This time includes coordination of care and counseling regarding above mentioned problem list. Remainder of non-face-to-face time involved reviewing chart documents/testing relevant to the patient encounter and documentation in the medical record. I have independently reviewed documentation from referring provider.   Cherlynn Kaiser, MD [place]  [facility]    Medication Adjustments/Labs and Tests Ordered: Current medicines are reviewed at length with the patient today.  Concerns regarding medicines are outlined above.  No orders of the defined types were placed in this encounter.  Meds ordered this encounter  Medications  . rosuvastatin (CRESTOR) 5 MG tablet    Sig: Take 1 tablet (5 mg total) by mouth daily.    Dispense:  90 tablet    Refill:  3    Patient Instructions  Medication Instructions:  Stop taking Simvastatin 20 mg  Start taking Rosuvastatin  5 mg daily  *If you need a refill on your cardiac medications before your next appointment, please call your pharmacy*   Lab Work: Please have blood work drawn at primary care (CMP, Fasting Lipid).   Testing/Procedures: None   Follow-Up: At [facility], you and your health needs are our priority.  As part of our continuing mission to provide you with exceptional heart care, we have created designated Provider Care Teams.  These Care Teams include your primary Cardiologist (physician) and Advanced Practice Providers (APPs -  Physician Assistants and Nurse Practitioners) who all work together to provide you with the care you need, when you need it.  We recommend signing up for the patient portal called "MyChart".  Sign up information is provided on this After Visit Summary.  MyChart is used to connect with patients for Virtual Visits (Telemedicine).  Patients are able to view lab/test results, encounter notes, upcoming appointments, etc.  Non-urgent messages can be sent to your provider as well.   To learn more about what you can do with MyChart, go to [email or web address].    Your next appointment:   6 month(s)  The format for your next appointment:   In Person  Provider:   Cherlynn Kaiser, MD

## 2020-06-18 ENCOUNTER — Ambulatory Visit
Admission: RE | Admit: 2020-06-18 | Discharge: 2020-06-18 | Disposition: A | Discharge status: Home / Self Care | Payer: Medicare Other | Source: Ambulatory Visit | Attending: Internal Medicine | Admitting: Internal Medicine

## 2020-06-18 ENCOUNTER — Other Ambulatory Visit: Payer: Self-pay

## 2020-06-18 DIAGNOSIS — Z1231 Encounter for screening mammogram for malignant neoplasm of breast: Secondary | ICD-10-CM

## 2020-06-19 ENCOUNTER — Ambulatory Visit (INDEPENDENT_AMBULATORY_CARE_PROVIDER_SITE_OTHER): Payer: Medicare Other | Admitting: Orthopaedic Surgery

## 2020-06-19 ENCOUNTER — Encounter: Payer: Self-pay | Admitting: Orthopaedic Surgery

## 2020-06-19 ENCOUNTER — Ambulatory Visit (INDEPENDENT_AMBULATORY_CARE_PROVIDER_SITE_OTHER): Payer: Medicare Other

## 2020-06-19 DIAGNOSIS — S52531D Colles' fracture of right radius, subsequent encounter for closed fracture with routine healing: Secondary | ICD-10-CM

## 2020-06-19 NOTE — Progress Notes (Signed)
The patient is now over 5 months out from a mechanical fall in which she sustained a right distal radius fracture.  She also had a small avulsion fracture of her left patella that required no treatment.  She is doing well overall.  She says she has a little bit of soreness in the wrist but is back to doing her regular activities.  On examination of her right wrist she lacks full dorsiflexion full palmar flexion but just a few degrees but she does have full pronation supination.  She has only a slightly weak grip strength.  2 views of the right wrist show that the distal radius fracture is healed completely with no shortening.  There is slight dorsal angulation.  Of note her right hand is well perfused with normal sensation.  Her left knee moves normally with no pain.  At this point follow-up can be as needed since she is doing so well.  All questions and concerns were answered and addressed.

## 2020-06-20 ENCOUNTER — Other Ambulatory Visit: Payer: Self-pay | Admitting: Internal Medicine

## 2020-06-20 DIAGNOSIS — R928 Other abnormal and inconclusive findings on diagnostic imaging of breast: Secondary | ICD-10-CM

## 2020-07-01 ENCOUNTER — Ambulatory Visit: Payer: Medicare Other | Admitting: Gastroenterology

## 2020-07-03 ENCOUNTER — Ambulatory Visit
Admission: RE | Admit: 2020-07-03 | Discharge: 2020-07-03 | Disposition: A | Discharge status: Home / Self Care | Payer: Medicare Other | Source: Ambulatory Visit | Attending: Internal Medicine | Admitting: Internal Medicine

## 2020-07-03 ENCOUNTER — Other Ambulatory Visit: Payer: Self-pay | Admitting: Internal Medicine

## 2020-07-03 ENCOUNTER — Other Ambulatory Visit: Payer: Self-pay

## 2020-07-03 DIAGNOSIS — R928 Other abnormal and inconclusive findings on diagnostic imaging of breast: Secondary | ICD-10-CM

## 2020-07-03 DIAGNOSIS — R921 Mammographic calcification found on diagnostic imaging of breast: Secondary | ICD-10-CM

## 2020-07-11 ENCOUNTER — Ambulatory Visit
Admission: RE | Admit: 2020-07-11 | Discharge: 2020-07-11 | Disposition: A | Discharge status: Home / Self Care | Payer: Medicare Other | Source: Ambulatory Visit | Attending: Internal Medicine | Admitting: Internal Medicine

## 2020-07-11 ENCOUNTER — Other Ambulatory Visit: Payer: Self-pay

## 2020-07-11 DIAGNOSIS — R921 Mammographic calcification found on diagnostic imaging of breast: Secondary | ICD-10-CM

## 2020-07-11 DIAGNOSIS — N6012 Diffuse cystic mastopathy of left breast: Secondary | ICD-10-CM | POA: Diagnosis not present

## 2020-07-11 HISTORY — PX: BREAST EXCISIONAL BIOPSY: SUR124

## 2020-07-19 ENCOUNTER — Other Ambulatory Visit: Payer: Self-pay | Admitting: Gastroenterology

## 2020-07-27 DIAGNOSIS — Z23 Encounter for immunization: Secondary | ICD-10-CM | POA: Diagnosis not present

## 2020-08-06 DIAGNOSIS — H35363 Drusen (degenerative) of macula, bilateral: Secondary | ICD-10-CM | POA: Diagnosis not present

## 2020-08-13 ENCOUNTER — Ambulatory Visit: Payer: Medicare Other | Admitting: Gastroenterology

## 2020-08-22 ENCOUNTER — Other Ambulatory Visit: Payer: Self-pay | Admitting: Gastroenterology

## 2020-09-17 ENCOUNTER — Ambulatory Visit (INDEPENDENT_AMBULATORY_CARE_PROVIDER_SITE_OTHER): Payer: Medicare Other

## 2020-09-17 ENCOUNTER — Ambulatory Visit (INDEPENDENT_AMBULATORY_CARE_PROVIDER_SITE_OTHER): Payer: Medicare Other | Admitting: Orthopaedic Surgery

## 2020-09-17 ENCOUNTER — Encounter: Payer: Self-pay | Admitting: Orthopaedic Surgery

## 2020-09-17 DIAGNOSIS — M1711 Unilateral primary osteoarthritis, right knee: Secondary | ICD-10-CM

## 2020-09-17 DIAGNOSIS — M1712 Unilateral primary osteoarthritis, left knee: Secondary | ICD-10-CM | POA: Diagnosis not present

## 2020-09-17 MED ORDER — METHYLPREDNISOLONE ACETATE 40 MG/ML IJ SUSP
40.0000 mg | INTRAMUSCULAR | Status: AC | PRN
  Administered 2020-09-17: 40 mg via INTRA_ARTICULAR

## 2020-09-17 MED ORDER — LIDOCAINE HCL 1 % IJ SOLN
0.5000 mL | INTRAMUSCULAR | Status: AC | PRN
  Administered 2020-09-17: .5 mL

## 2020-09-17 NOTE — Progress Notes (Signed)
Office Visit Note   Patient: Linda [name]           Date of Birth: [birth year]           MRN: [id number] Visit Date: 09/17/2020              Requested by: Crist Infante, MD [address] [place],  [place] [address] PCP: Crist Infante, MD   Assessment & Plan: Visit Diagnoses:  1. Primary osteoarthritis of right knee   2. Primary osteoarthritis of left knee     Plan: We will see her back in just 2 weeks to see what type of response she had to the cortisone injections both knees.  She may benefit from supplemental injections in the future we will discuss this with her at next visit did briefly discuss supplemental injections with her today.  Questions were encouraged and answered  Follow-Up Instructions: Return in about 2 weeks (around 10/01/2020).   Orders:  Orders Placed This Encounter  Procedures  . Large Joint Inj  . XR Knee 1-2 Views Right   No orders of the defined types were placed in this encounter.     Procedures: Large Joint Inj: bilateral knee on 09/17/2020 9:20 AM Indications: pain Details: 22 G 1.5 in needle, anterolateral approach  Arthrogram: No  Medications (Right): 0.5 mL lidocaine 1 %; 40 mg methylPREDNISolone acetate 40 MG/ML Medications (Left): 0.5 mL lidocaine 1 %; 40 mg methylPREDNISolone acetate 40 MG/ML Outcome: tolerated well, no immediate complications Procedure, treatment alternatives, risks and benefits explained, specific risks discussed. Consent was given by the patient. Immediately prior to procedure a time out was called to verify the correct patient, procedure, equipment, support staff and site/side marked as required. Patient was prepped and draped in the usual sterile fashion.       Clinical Data: No additional findings.   Subjective: Chief Complaint  Patient presents with  . Right Knee - Pain  . Left Knee - Pain    HPI Linda [name] is well-known to Dr. Ninfa Linden service comes in today due to bilateral knee pain.  She  states that her right knee pain is worse than her left.  She has had no new injury to either knee.  She is wearing a brace on the right knee that seems to help some.  She is taking Tylenol as needed for pain.  No mechanical symptoms of either knee.  Patient is nondiabetic. Review of Systems Denies any fevers chills.  Objective: Vital Signs: There were no vitals taken for this visit.  Physical Exam General: Well-developed well-nourished female no acute distress mood affect appropriate Psych: Alert and oriented x3 Ortho Exam Bilateral knees good range of motion bilateral knees crepitus both knees with passive range of motion right worse than left.  No instability with valgus varus stressing of either knee.  No abnormal warmth erythema or effusion of either knee. Specialty Comments:  No specialty comments available.  Imaging: XR Knee 1-2 Views Right  Result Date: 09/17/2020 Right knee 2 views: Shows tricompartmental arthritis with mild changes in the patellofemoral joint and the lateral joint line moderate narrowing medial joint line.  No acute fractures knee is well located.    PMFS History: Patient Active Problem List   Diagnosis Date Noted  . Acute right-sided low back pain with right-sided sciatica 06/13/2018  . Hypertension   . High cholesterol   . Anxiety    Past Medical History:  Diagnosis Date  . Anxiety   . Candida esophagitis ([place])   .  Chronic kidney disease   . DDD (degenerative disc disease)   . Depression   . Erosive gastritis   . Falls   . GERD (gastroesophageal reflux disease)   . Hiatal hernia   . High cholesterol   . History of anal fissures   . Hypertension   . Obesity   . Osteoarthritis   . Panic disorder   . Sleep apnea    Does not need CPAP anymore    Family History  Problem Relation Age of Onset  . Heart disease Mother   . Lung cancer Father   . Diabetes Sister   . Hypertension Sister   . Diabetes Brother   . Heart disease Brother   .  Heart attack Brother   . Ovarian cancer Sister   . Heart disease Brother   . Heart attack Brother   . Heart disease Brother   . Heart attack Brother   . Colon cancer Neg Hx     Past Surgical History:  Procedure Laterality Date  . ABDOMINAL HYSTERECTOMY  1987   LSO / menorrhagia & leiomyomata  . APPENDECTOMY    . BREAST EXCISIONAL BIOPSY Left 1989   benign  . BREAST SURGERY  1989   benign tumor left breast  . CHOLECYSTECTOMY  2001  . TUBAL LIGATION     Social History   Occupational History  . Occupation: Retired   Tobacco Use  . Smoking status: Never Smoker  . Smokeless tobacco: Never Used  Vaping Use  . Vaping Use: Never used  Substance and Sexual Activity  . Alcohol use: No  . Drug use: No  . Sexual activity: Yes

## 2020-09-20 ENCOUNTER — Other Ambulatory Visit: Payer: Self-pay | Admitting: Gastroenterology

## 2020-09-20 DIAGNOSIS — E785 Hyperlipidemia, unspecified: Secondary | ICD-10-CM | POA: Diagnosis not present

## 2020-09-27 DIAGNOSIS — E785 Hyperlipidemia, unspecified: Secondary | ICD-10-CM | POA: Diagnosis not present

## 2020-09-27 DIAGNOSIS — R82998 Other abnormal findings in urine: Secondary | ICD-10-CM | POA: Diagnosis not present

## 2020-09-27 DIAGNOSIS — K219 Gastro-esophageal reflux disease without esophagitis: Secondary | ICD-10-CM | POA: Diagnosis not present

## 2020-09-27 DIAGNOSIS — I4510 Unspecified right bundle-branch block: Secondary | ICD-10-CM | POA: Diagnosis not present

## 2020-09-27 DIAGNOSIS — F339 Major depressive disorder, recurrent, unspecified: Secondary | ICD-10-CM | POA: Diagnosis not present

## 2020-09-27 DIAGNOSIS — I739 Peripheral vascular disease, unspecified: Secondary | ICD-10-CM | POA: Diagnosis not present

## 2020-09-27 DIAGNOSIS — I10 Essential (primary) hypertension: Secondary | ICD-10-CM | POA: Diagnosis not present

## 2020-09-27 DIAGNOSIS — R296 Repeated falls: Secondary | ICD-10-CM | POA: Diagnosis not present

## 2020-09-27 DIAGNOSIS — Z780 Asymptomatic menopausal state: Secondary | ICD-10-CM | POA: Diagnosis not present

## 2020-09-27 DIAGNOSIS — G4733 Obstructive sleep apnea (adult) (pediatric): Secondary | ICD-10-CM | POA: Diagnosis not present

## 2020-09-27 DIAGNOSIS — M25561 Pain in right knee: Secondary | ICD-10-CM | POA: Diagnosis not present

## 2020-09-27 DIAGNOSIS — Z0000 Encounter for general adult medical examination without abnormal findings: Secondary | ICD-10-CM | POA: Diagnosis not present

## 2020-10-01 ENCOUNTER — Ambulatory Visit (INDEPENDENT_AMBULATORY_CARE_PROVIDER_SITE_OTHER): Payer: Medicare Other | Admitting: Orthopaedic Surgery

## 2020-10-01 DIAGNOSIS — M1712 Unilateral primary osteoarthritis, left knee: Secondary | ICD-10-CM

## 2020-10-01 DIAGNOSIS — M1711 Unilateral primary osteoarthritis, right knee: Secondary | ICD-10-CM

## 2020-10-01 NOTE — Progress Notes (Signed)
HPI: [name] returns today follow-up of bilateral knee pain. She has known tricompartmental arthritis both knees. She states the cortisone injections on 09/17/2020 helped. She is having no pain in either knee. She had no adverse effects to the injections.  Physical exam: Bilateral knees good range of motion. Ambulates without any assistive device.  Impression: Tricompartmental arthritis bilateral knees  Plan: Recommend she work on Forensic scientist. If she has short-lived good results with the cortisone injection she can call our office and we could order supplemental injection. However for now we will hold on ordering supplemental injection as she is happy with the results with cortisone. No charge for today's office visit. Follow-up as needed understands to wait at least 3 months between cortisone injections.

## 2020-10-22 ENCOUNTER — Other Ambulatory Visit: Payer: Self-pay | Admitting: Gastroenterology

## 2020-10-29 ENCOUNTER — Encounter: Payer: Self-pay | Admitting: Gastroenterology

## 2020-10-29 ENCOUNTER — Ambulatory Visit: Payer: Medicare HMO | Admitting: Gastroenterology

## 2020-10-29 VITALS — BP 114/60 | HR 64 | Ht 61.0 in | Wt 154.0 lb

## 2020-10-29 DIAGNOSIS — R0989 Other specified symptoms and signs involving the circulatory and respiratory systems: Secondary | ICD-10-CM

## 2020-10-29 DIAGNOSIS — R1310 Dysphagia, unspecified: Secondary | ICD-10-CM

## 2020-10-29 DIAGNOSIS — R198 Other specified symptoms and signs involving the digestive system and abdomen: Secondary | ICD-10-CM

## 2020-10-29 DIAGNOSIS — Z86010 Personal history of colonic polyps: Secondary | ICD-10-CM | POA: Diagnosis not present

## 2020-10-29 DIAGNOSIS — K219 Gastro-esophageal reflux disease without esophagitis: Secondary | ICD-10-CM

## 2020-10-29 DIAGNOSIS — Z860101 Personal history of adenomatous and serrated colon polyps: Secondary | ICD-10-CM

## 2020-10-29 DIAGNOSIS — R09A2 Foreign body sensation, throat: Secondary | ICD-10-CM

## 2020-10-29 MED ORDER — PLENVU 140 G PO SOLR: 1.0000 | Freq: Once | ORAL | 0 refills | Status: AC

## 2020-10-29 NOTE — Patient Instructions (Signed)
You have been scheduled for an endoscopy and colonoscopy. Please follow the written instructions given to you at your visit today. Please pick up your prep supplies at the pharmacy within the next 1-3 days. If you use inhalers (even only as needed), please bring them with you on the day of your procedure.  Due to recent changes in healthcare laws, you may see the results of your imaging and laboratory studies on MyChart before your provider has had a chance to review them.  We understand that in some cases there may be results that are confusing or concerning to you. Not all laboratory results come back in the same time frame and the provider may be waiting for multiple results in order to interpret others.  Please give [place] 48 hours in order for your provider to thoroughly review all the results before contacting the office for clarification of your results.   Normal BMI (Body Mass Index- based on height and weight) is between 23 and 30. Your BMI today is Body mass index is 29.1 kg/m. Marland Kitchen Please consider follow up  regarding your BMI with your Primary Care Provider.  Thank you for choosing me and [facility].  Pricilla Riffle. Dagoberto Ligas., MD., [facility]

## 2020-10-29 NOTE — Progress Notes (Signed)
    History of Present Illness: This is a 80 year old female who relates intermittent difficulties with a lump in the throat sensation and intermittent difficulties swallowing solid foods.  The symptoms have happened for a few months and have not progressed in frequency or severity.  She occasionally notes nausea at various times.  She is also noted intermittent difficulties with a decreased appetite, decreased taste and a bitter sensation on her tongue.  These symptoms have been present for few months as well.  Her constipation is well controlled with MiraLAX. Denies weight loss, abdominal pain, diarrhea, change in stool caliber, melena, hematochezia, vomiting, chest pain.  EGD 03/2017: small HH, otherwise normal Colonoscopy 05/2015: one 6 mm tubular adenoma, otherwise normal  Current Medications, Allergies, Past Medical History, Past Surgical History, Family History and Social History were reviewed in [employer] record.   Physical Exam: General: Well developed, well nourished, no acute distress Head: Normocephalic and atraumatic Eyes:  sclerae anicteric, EOMI Ears: Normal auditory acuity Mouth: Not examined, mask on during Covid-19 pandemic Lungs: Clear throughout to auscultation Heart: Regular rate and rhythm; no murmurs, rubs or bruits Abdomen: Soft, non tender and non distended. No masses, hepatosplenomegaly or hernias noted. Normal Bowel sounds Rectal: Not done Musculoskeletal: Symmetrical with no gross deformities  Pulses:  Normal pulses noted Extremities: No clubbing, cyanosis, edema or deformities noted Neurological: Alert oriented x 4, grossly nonfocal Psychological:  Alert and cooperative. Normal mood and affect   Assessment and Recommendations:  1. GERD, intermittent globus sensation, intermittent solid food dysphagia. R/O esophagitis, esophageal stricture. R/O postnasal drip, allergies, sinus disorder.  Follow antireflux measures.  Continue Dexilant 60  mg every morning and famotidine 20 mg at bedtime.  Schedule EGD with possible dilation. The risks (including bleeding, perforation, infection, missed lesions, medication reactions and possible hospitalization or surgery if complications occur), benefits, and alternatives to endoscopy with possible biopsy and possible dilation were discussed with the patient and they consent to proceed.   2. Personal history of adenomatous colon polyps.  She is due for surveillance colonoscopy.  Schedule colonoscopy at time of EGD. The risks (including bleeding, perforation, infection, missed lesions, medication reactions and possible hospitalization or surgery if complications occur), benefits, and alternatives to colonoscopy with possible biopsy and possible polypectomy were discussed with the patient and they consent to proceed.   3. Constipation.  Well controlled with MiraLAX.

## 2020-10-31 DIAGNOSIS — C44319 Basal cell carcinoma of skin of other parts of face: Secondary | ICD-10-CM | POA: Diagnosis not present

## 2020-11-14 ENCOUNTER — Telehealth: Payer: Self-pay | Admitting: Gastroenterology

## 2020-11-14 NOTE — Telephone Encounter (Signed)
Inbound call from pharmacy stating Plenvu is not covered under patient's insurance but Suprep is.  Wanting to know if ok to switch.  Please advise.

## 2020-11-14 NOTE — Telephone Encounter (Signed)
Left message for patient to return my call.

## 2020-11-14 NOTE — Telephone Encounter (Signed)
Patient states she will pay for the Plenvu as previously prescribed.

## 2020-11-14 NOTE — Telephone Encounter (Signed)
Patient is unaware of the prep prices and switching her prep to an alternative. Patient states she will contact the pharmacy and call our office back with a decision.

## 2020-11-18 ENCOUNTER — Other Ambulatory Visit: Payer: Self-pay | Admitting: Gastroenterology

## 2020-11-19 ENCOUNTER — Telehealth: Payer: Self-pay | Admitting: Gastroenterology

## 2020-11-19 ENCOUNTER — Encounter: Payer: Self-pay | Admitting: Gastroenterology

## 2020-11-19 ENCOUNTER — Other Ambulatory Visit: Payer: Self-pay

## 2020-11-19 ENCOUNTER — Ambulatory Visit (AMBULATORY_SURGERY_CENTER): Payer: Medicare HMO | Admitting: Gastroenterology

## 2020-11-19 VITALS — BP 102/61 | HR 67 | Temp 97.8°F | Resp 28 | Ht 61.0 in | Wt 154.0 lb

## 2020-11-19 DIAGNOSIS — D123 Benign neoplasm of transverse colon: Secondary | ICD-10-CM | POA: Diagnosis not present

## 2020-11-19 DIAGNOSIS — Z86010 Personal history of colonic polyps: Secondary | ICD-10-CM | POA: Diagnosis not present

## 2020-11-19 DIAGNOSIS — K219 Gastro-esophageal reflux disease without esophagitis: Secondary | ICD-10-CM

## 2020-11-19 DIAGNOSIS — R1310 Dysphagia, unspecified: Secondary | ICD-10-CM

## 2020-11-19 DIAGNOSIS — Z1211 Encounter for screening for malignant neoplasm of colon: Secondary | ICD-10-CM | POA: Diagnosis not present

## 2020-11-19 DIAGNOSIS — D120 Benign neoplasm of cecum: Secondary | ICD-10-CM | POA: Diagnosis not present

## 2020-11-19 MED ORDER — SODIUM CHLORIDE 0.9 % IV SOLN: 500.0000 mL | Freq: Once | INTRAVENOUS | Status: DC

## 2020-11-19 NOTE — Progress Notes (Signed)
Pt's states no medical or surgical changes since previsit or office visit. 

## 2020-11-19 NOTE — Telephone Encounter (Signed)
Pt states she finished her prep and now "my stomach is upset."  I told her to drink something with carbonation, such as Coke or Sprite, until her 12 noon cut off time to see if that can settle her stomach.  Understanding voiced

## 2020-11-19 NOTE — Op Note (Signed)
[place] Patient Name: Linda [name] Procedure Date: 11/19/2020 3:35 PM MRN: [id number] Endoscopist: Ladene Artist , MD Age: 80 Referring MD:  Date of Birth: 1941-02-16 Gender: Female Account #: [id number] Procedure:                Colonoscopy Indications:              Surveillance: Personal history of adenomatous                            polyps on last colonoscopy 5 years ago Medicines:                Monitored Anesthesia Care Procedure:                Pre-Anesthesia Assessment:                           - Prior to the procedure, a History and Physical                            was performed, and patient medications and                            allergies were reviewed. The patient's tolerance of                            previous anesthesia was also reviewed. The risks                            and benefits of the procedure and the sedation                            options and risks were discussed with the patient.                            All questions were answered, and informed consent                            was obtained. Prior Anticoagulants: The patient has                            taken no previous anticoagulant or antiplatelet                            agents. ASA Grade Assessment: II - A patient with                            mild systemic disease. After reviewing the risks                            and benefits, the patient was deemed in                            satisfactory condition to undergo the procedure.  After obtaining informed consent, the colonoscope                            was passed under direct vision. Throughout the                            procedure, the patient's blood pressure, pulse, and                            oxygen saturations were monitored continuously. The                            Olympus [id number]) [id number] was introduced                            through the anus and  advanced to the the cecum,                            identified by appendiceal orifice and ileocecal                            valve. The ileocecal valve, appendiceal orifice,                            and rectum were photographed. The quality of the                            bowel preparation was adequate after extensive                            lavage and suction. The colonoscopy was performed                            without difficulty. The patient tolerated the                            procedure well. Scope In: 3:46:30 PM Scope Out: 4:05:18 PM Scope Withdrawal Time: 0 hours 15 minutes 15 seconds  Total Procedure Duration: 0 hours 18 minutes 48 seconds  Findings:                 The perianal and digital rectal examinations were                            normal.                           Two sessile polyps were found in the cecum. The                            polyps were 2 to 4 mm in size. These polyps were                            removed with a cold biopsy forceps. Resection and  retrieval were complete.                           Four sessile polyps were found in the transverse                            colon. The polyps were 5 to 8 mm in size. These                            polyps were removed with a cold snare. Resection                            and retrieval were complete.                           The exam was otherwise without abnormality on                            direct and retroflexion views. Complications:            No immediate complications. Estimated blood loss:                            None. Estimated Blood Loss:     Estimated blood loss: none. Impression:               - Two 2 to 4 mm polyps in the cecum, removed with a                            cold biopsy forceps. Resected and retrieved.                           - Four 5 to 8 mm polyps in the transverse colon,                            removed with a cold snare.  Resected and retrieved.                           - The examination was otherwise normal on direct                            and retroflexion views. Recommendation:           - Patient has a contact number available for                            emergencies. The signs and symptoms of potential                            delayed complications were discussed with the                            patient. Return to normal activities tomorrow.  Written discharge instructions were provided to the                            patient.                           - Resume previous diet.                           - Continue present medications.                           - Await pathology results.                           - No repeat colonoscopy due to age. Ladene Artist, MD 11/19/2020 4:18:49 PM This report has been signed electronically.

## 2020-11-19 NOTE — Op Note (Signed)
[place] Patient Name: Linda [name] Procedure Date: 11/19/2020 3:35 PM MRN: [id number] Endoscopist: Ladene Artist , MD Age: 80 Referring MD:  Date of Birth: 09/04/1941 Gender: Female Account #: [id number] Procedure:                Upper GI endoscopy Indications:              Dysphagia, Gastroesophageal reflux disease, Globus                            sensation Medicines:                Monitored Anesthesia Care Procedure:                Pre-Anesthesia Assessment:                           - Prior to the procedure, a History and Physical                            was performed, and patient medications and                            allergies were reviewed. The patient's tolerance of                            previous anesthesia was also reviewed. The risks                            and benefits of the procedure and the sedation                            options and risks were discussed with the patient.                            All questions were answered, and informed consent                            was obtained. Prior Anticoagulants: The patient has                            taken no previous anticoagulant or antiplatelet                            agents. ASA Grade Assessment: II - A patient with                            mild systemic disease. After reviewing the risks                            and benefits, the patient was deemed in                            satisfactory condition to undergo the procedure.  After obtaining informed consent, the endoscope was                            passed under direct vision. Throughout the                            procedure, the patient's blood pressure, pulse, and                            oxygen saturations were monitored continuously. The                            Endoscope was introduced through the mouth, and                            advanced to the second part of duodenum.  The upper                            GI endoscopy was accomplished without difficulty.                            The patient tolerated the procedure well. Scope In: Scope Out: Findings:                 No endoscopic abnormality was evident in the                            esophagus to explain the patient's complaint of                            dysphagia. It was decided, however, to proceed with                            dilation of the entire esophagus. A guidewire was                            placed and the scope was withdrawn. Dilation was                            performed with a Savary dilator with no resistance                            at 16 mm.                           A small hiatal hernia was present.                           The exam of the stomach was otherwise normal.                           The duodenal bulb and second portion of the  duodenum were normal. Complications:            No immediate complications. Estimated Blood Loss:     Estimated blood loss was minimal. Impression:               - No endoscopic esophageal abnormality to explain                            patient's dysphagia. Esophagus dilated.                           - Small hiatal hernia.                           - Normal duodenal bulb and second portion of the                            duodenum.                           - No specimens collected. Recommendation:           - Patient has a contact number available for                            emergencies. The signs and symptoms of potential                            delayed complications were discussed with the                            patient. Return to normal activities tomorrow.                            Written discharge instructions were provided to the                            patient.                           - Clear liquid diet for 2 hours, then advance as                            tolerated to  soft diet today.                           - Resume prior diet tomorrow.                           - Follow antireflux measures.                           - Continue present medications.                           - GI follow in 1 year. Ladene Artist, MD 11/19/2020 4:23:17 PM This report has been signed electronically.

## 2020-11-19 NOTE — Patient Instructions (Signed)
Handouts provided on polyps, hiatal hernia/anti-reflux measures and post-dilation diet.   YOU HAD AN ENDOSCOPIC PROCEDURE TODAY AT [facility]:   Refer to the procedure report that was given to you for any specific questions about what was found during the examination.  If the procedure report does not answer your questions, please call your gastroenterologist to clarify.  If you requested that your care partner not be given the details of your procedure findings, then the procedure report has been included in a sealed envelope for you to review at your convenience later.  YOU SHOULD EXPECT: Some feelings of bloating in the abdomen. Passage of more gas than usual.  Walking can help get rid of the air that was put into your GI tract during the procedure and reduce the bloating. If you had a lower endoscopy (such as a colonoscopy or flexible sigmoidoscopy) you may notice spotting of blood in your stool or on the toilet paper. If you underwent a bowel prep for your procedure, you may not have a normal bowel movement for a few days.  Please Note:  You might notice some irritation and congestion in your nose or some drainage.  This is from the oxygen used during your procedure.  There is no need for concern and it should clear up in a day or so.  SYMPTOMS TO REPORT IMMEDIATELY:   Following lower endoscopy (colonoscopy or flexible sigmoidoscopy):  Excessive amounts of blood in the stool  Significant tenderness or worsening of abdominal pains  Swelling of the abdomen that is new, acute  Fever of 100F or higher   Following upper endoscopy (EGD)  Vomiting of blood or coffee ground material  New chest pain or pain under the shoulder blades  Painful or persistently difficult swallowing  New shortness of breath  Fever of 100F or higher  Black, tarry-looking stools  For urgent or emergent issues, a gastroenterologist can be reached at any hour by calling [phone]. Do not use  MyChart messaging for urgent concerns.    DIET:  Post-dilation diet: Clear liquids for 2 hours. Then a Soft diet (see handout) for the rest of today. You may resume your regular diet tomorrow if your swallowing feels intact.  Drink plenty of fluids but you should avoid alcoholic beverages for 24 hours.  ACTIVITY:  You should plan to take it easy for the rest of today and you should NOT DRIVE or use heavy machinery until tomorrow (because of the sedation medicines used during the test).    FOLLOW UP: Our staff will call the number listed on your records 48-72 hours following your procedure to check on you and address any questions or concerns that you may have regarding the information given to you following your procedure. If we do not reach you, we will leave a message.  We will attempt to reach you two times.  During this call, we will ask if you have developed any symptoms of COVID 19. If you develop any symptoms (ie: fever, flu-like symptoms, shortness of breath, cough etc.) before then, please call [phone].  If you test positive for Covid 19 in the 2 weeks post procedure, please call and report this information to [place].    If any biopsies were taken you will be contacted by phone or by letter within the next 1-3 weeks.  Please call us at [phone] if you have not heard about the biopsies in 3 weeks.    SIGNATURES/CONFIDENTIALITY: You and/or your care partner  have signed paperwork which will be entered into your electronic medical record.  These signatures attest to the fact that that the information above on your After Visit Summary has been reviewed and is understood.  Full responsibility of the confidentiality of this discharge information lies with you and/or your care-partner.

## 2020-11-19 NOTE — Progress Notes (Signed)
Called to room to assist during endoscopic procedure.  Patient ID and intended procedure confirmed with present staff. Received instructions for my participation in the procedure from the performing physician.  

## 2020-11-19 NOTE — Progress Notes (Signed)
A and O x3. Report to RN. Tolerated MAC anesthesia well.Teeth unchanged after procedure.

## 2020-11-19 NOTE — Progress Notes (Signed)
C.W. vital signs. 

## 2020-11-21 ENCOUNTER — Telehealth: Payer: Self-pay | Admitting: *Deleted

## 2020-11-21 ENCOUNTER — Telehealth: Payer: Self-pay

## 2020-11-21 NOTE — Telephone Encounter (Signed)
  Follow up Call-  Call back number 11/19/2020  Post procedure Call Back phone  # [phone]  Permission to leave phone message Yes  Some recent data might be hidden     Patient questions:  Do you have a fever, pain , or abdominal swelling? No. Pain Score  0 *  Have you tolerated food without any problems? Yes.    Have you been able to return to your normal activities? Yes.    Do you have any questions about your discharge instructions: Diet   No. Medications  No. Follow up visit  No.  Do you have questions or concerns about your Care? No.  Actions: * If pain score is 4 or above: No action needed, pain <4.  1. Have you developed a fever since your procedure? no  2.   Have you had an respiratory symptoms (SOB or cough) since your procedure? no  3.   Have you tested positive for COVID 19 since your procedure no  4.   Have you had any family members/close contacts diagnosed with the COVID 19 since your procedure?  no   If yes to any of these questions please route to Joylene John, RN and Joella Prince, RN

## 2020-11-21 NOTE — Telephone Encounter (Signed)
Left message on follow up call. 

## 2020-11-28 DIAGNOSIS — R296 Repeated falls: Secondary | ICD-10-CM | POA: Diagnosis not present

## 2020-11-28 DIAGNOSIS — S62109S Fracture of unspecified carpal bone, unspecified wrist, sequela: Secondary | ICD-10-CM | POA: Diagnosis not present

## 2020-11-28 DIAGNOSIS — Z13820 Encounter for screening for osteoporosis: Secondary | ICD-10-CM | POA: Diagnosis not present

## 2020-12-04 ENCOUNTER — Encounter: Payer: Self-pay | Admitting: Gastroenterology

## 2020-12-05 DIAGNOSIS — L821 Other seborrheic keratosis: Secondary | ICD-10-CM | POA: Diagnosis not present

## 2020-12-05 DIAGNOSIS — Z08 Encounter for follow-up examination after completed treatment for malignant neoplasm: Secondary | ICD-10-CM | POA: Diagnosis not present

## 2020-12-05 DIAGNOSIS — Z85828 Personal history of other malignant neoplasm of skin: Secondary | ICD-10-CM | POA: Diagnosis not present

## 2020-12-10 ENCOUNTER — Telehealth: Payer: Self-pay | Admitting: Gastroenterology

## 2020-12-10 NOTE — Telephone Encounter (Signed)
I reviewed the letter and results with the patient.  All of her questions were answered.  She will call back for additional questions or concerns.

## 2020-12-11 ENCOUNTER — Other Ambulatory Visit: Payer: Self-pay | Admitting: Internal Medicine

## 2020-12-11 DIAGNOSIS — I10 Essential (primary) hypertension: Secondary | ICD-10-CM

## 2020-12-16 ENCOUNTER — Telehealth: Payer: Self-pay | Admitting: Internal Medicine

## 2020-12-16 DIAGNOSIS — I10 Essential (primary) hypertension: Secondary | ICD-10-CM

## 2020-12-16 MED ORDER — SPIRONOLACTONE 25 MG PO TABS: 25.0000 mg | ORAL_TABLET | Freq: Every day | ORAL | 0 refills | Status: DC

## 2020-12-16 NOTE — Telephone Encounter (Signed)
   *  STAT* If patient is at the pharmacy, call can be transferred to refill team.   1. Which medications need to be refilled? (please list name of each medication and dose if known)   spironolactone (ALDACTONE) 25 MG tablet     2. Which pharmacy/location (including street and city if local pharmacy) is medication to be sent to? [place]  3. Do they need a 30 day or 90 day supply? 90 days   Pt has only 2 pills left

## 2020-12-22 ENCOUNTER — Other Ambulatory Visit: Payer: Self-pay

## 2020-12-22 ENCOUNTER — Ambulatory Visit
Admission: EM | Admit: 2020-12-22 | Discharge: 2020-12-22 | Disposition: A | Discharge status: Home / Self Care | Payer: Medicare HMO | Attending: Family Medicine | Admitting: Family Medicine

## 2020-12-22 DIAGNOSIS — J0100 Acute maxillary sinusitis, unspecified: Secondary | ICD-10-CM

## 2020-12-22 DIAGNOSIS — R0982 Postnasal drip: Secondary | ICD-10-CM

## 2020-12-22 DIAGNOSIS — J209 Acute bronchitis, unspecified: Secondary | ICD-10-CM

## 2020-12-22 MED ORDER — DOXYCYCLINE HYCLATE 100 MG PO CAPS: 100.0000 mg | ORAL_CAPSULE | Freq: Two times a day (BID) | ORAL | 0 refills | Status: DC

## 2020-12-22 MED ORDER — PSEUDOEPH-BROMPHEN-DM 30-2-10 MG/5ML PO SYRP: 5.0000 mL | ORAL_SOLUTION | Freq: Three times a day (TID) | ORAL | 0 refills | Status: DC | PRN

## 2020-12-22 NOTE — ED Provider Notes (Signed)
[facility]    CSN: [id number] Arrival date & time: 12/22/20  9924      History   Chief Complaint No chief complaint on file.   HPI Linda [name] is a 80 y.o. female.   HPI Patient presents today, with 5 days of worsening nasal congestion, persistent nasal drainage, sinus pressure, and persistent cough. No known sick contacts.  She has been managing with over-the-counter medication without relief.  She has been without fever.  Denies any shortness of breath or chest tightness. Past Medical History:  Diagnosis Date  . Anxiety   . Candida esophagitis ([place])   . Chronic kidney disease   . DDD (degenerative disc disease)   . Depression   . Erosive gastritis   . Falls   . GERD (gastroesophageal reflux disease)   . Hiatal hernia   . High cholesterol   . History of anal fissures   . Hypertension   . Obesity   . Osteoarthritis   . Panic disorder   . Sleep apnea    Does not need CPAP anymore    Patient Active Problem List   Diagnosis Date Noted  . Acute right-sided low back pain with right-sided sciatica 06/13/2018  . Hypertension   . High cholesterol   . Anxiety     Past Surgical History:  Procedure Laterality Date  . ABDOMINAL HYSTERECTOMY  1987   LSO / menorrhagia & leiomyomata  . APPENDECTOMY    . BREAST EXCISIONAL BIOPSY Left 1989   benign  . BREAST SURGERY  1989   benign tumor left breast  . CHOLECYSTECTOMY  2001  . TUBAL LIGATION      OB History    Gravida  3   Para  1   Term      Preterm      AB  2   Living  1     SAB  2   IAB      Ectopic      Multiple      Live Births               Home Medications    Prior to Admission medications   Medication Sig Start Date End Date Taking? Authorizing Provider  acetaminophen-codeine (TYLENOL #3) 300-30 MG tablet Take 1-2 tablets by mouth every 8 (eight) hours as needed for moderate pain. 01/11/20   Mcarthur Rossetti, MD  ALPRAZolam Duanne Moron) 0.5 MG tablet Take 0.5  mg by mouth daily. 08/20/20   [provider]  AMBULATORY NON FORMULARY MEDICATION FDGARD 2 capsules by mouth twice a day    [provider]  amLODipine (NORVASC) 10 MG tablet Take 10 mg by mouth daily.  07/01/12   [provider]  benazepril (LOTENSIN) 40 MG tablet Take 40 mg by mouth daily.    [provider]  BIOTIN PO Take 1 tablet by mouth daily.    [provider]  calcium carbonate (TUMS - DOSED IN MG ELEMENTAL CALCIUM) 500 MG chewable tablet Chew 1 tablet by mouth daily.    [provider]  desvenlafaxine (PRISTIQ) 100 MG 24 hr tablet Take 100 mg by mouth daily.  05/03/18   [provider]  DEXILANT 60 MG capsule TAKE ONE CAPSULE (60MG  TOTAL) BY MOUTH DAILY 11/18/20   Ladene Artist, MD  diazepam (VALIUM) 5 MG tablet SMARTSIG:1 Tablet(s) By Mouth Every Evening 05/22/20   [provider]  famotidine (SM ACID REDUCER MAX ST) 20 MG tablet TAKE ONE TABLET (  20MG  TOTAL) BY MOUTH ATBEDTIME 10/23/19   Ladene Artist, MD  lamoTRIgine (LAMICTAL) 200 MG tablet Take 200 mg by mouth 2 (two) times daily.    [provider]  loratadine (CLARITIN) 10 MG tablet Take 10 mg by mouth daily.    [provider]  ondansetron (ZOFRAN) 8 MG tablet Take 1 tablet (8 mg total) by mouth every 8 (eight) hours as needed for nausea. 01/07/17   Esterwood, Amy S, PA-C  rosuvastatin (CRESTOR) 10 MG tablet  09/27/20   [provider]  spironolactone (ALDACTONE) 25 MG tablet Take 1 tablet (25 mg total) by mouth daily. 12/16/20 03/16/21  Elouise Munroe, MD    Family History Family History  Problem Relation Age of Onset  . Heart disease Mother   . Lung cancer Father   . Diabetes Sister   . Hypertension Sister   . Diabetes Brother   . Heart disease Brother   . Heart attack Brother   . Ovarian cancer Sister   . Heart disease Brother   . Heart attack Brother   . Heart disease Brother   . Heart attack Brother   . Colon  cancer Neg Hx     Social History Social History   Tobacco Use  . Smoking status: Never Smoker  . Smokeless tobacco: Never Used  Vaping Use  . Vaping Use: Never used  Substance Use Topics  . Alcohol use: No  . Drug use: No     Allergies   Amoxicillin and Abilify [aripiprazole]   Review of Systems Review of Systems Pertinent negatives listed in HPI   Physical Exam Triage Vital Signs ED Triage Vitals  Enc Vitals Group     BP 12/22/20 0818 127/71     Pulse Rate 12/22/20 0818 94     Resp 12/22/20 0818 16     Temp 12/22/20 0818 98.1 F (36.7 C)     Temp Source 12/22/20 0818 Tympanic     SpO2 12/22/20 0818 94 %     Weight --      Height --      Head Circumference --      Peak Flow --      Pain Score 12/22/20 0820 0     Pain Loc --      Pain Edu? --      Excl. in [place]? --    No data found.  Updated Vital Signs BP 127/71 (BP Location: Right Arm)   Pulse 94   Temp 98.1 F (36.7 C) (Tympanic)   Resp 16   SpO2 94%   Visual Acuity Right Eye Distance:   Left Eye Distance:   Bilateral Distance:    Right Eye Near:   Left Eye Near:    Bilateral Near:     Physical Exam Constitutional:      General: She is not in acute distress.    Appearance: She is ill-appearing. She is not toxic-appearing.  HENT:     Head: Normocephalic.     Right Ear: Tympanic membrane normal.     Left Ear: Tympanic membrane normal.     Nose: Mucosal edema, congestion and rhinorrhea present. Rhinorrhea is purulent.     Mouth/Throat:     Mouth: Mucous membranes are dry.     Pharynx: No oropharyngeal exudate.  Eyes:     Extraocular Movements: Extraocular movements intact.     Pupils: Pupils are equal, round, and reactive to light.  Cardiovascular:     Rate and Rhythm: Normal  rate and regular rhythm.  Pulmonary:     Effort: Pulmonary effort is normal.     Comments: Coarse bilateral upper lung symptoms noted on exam clear to auscultation.  No wheezes, crackles, rhonchi  Musculoskeletal:      Cervical back: Normal range of motion.  Lymphadenopathy:     Cervical: Cervical adenopathy present.  Skin:    General: Skin is warm.     Capillary Refill: Capillary refill takes less than 2 seconds.  Neurological:     General: No focal deficit present.     Mental Status: She is alert.  Psychiatric:        Mood and Affect: Mood normal.        Behavior: Behavior normal.        Thought Content: Thought content normal.        Judgment: Judgment normal.      UC Treatments / Results  Labs (all labs ordered are listed, but only abnormal results are displayed) Labs Reviewed - No data to display  EKG   Radiology No results found.  Procedures Procedures (including critical care time)  Medications Ordered in UC Medications - No data to display  Initial Impression / Assessment and Plan / UC Course  I have reviewed the triage vital signs and the nursing notes.  Pertinent labs & imaging results that were available during my care of the patient were reviewed by me and considered in my medical decision making (see chart for details).     Treated for acute maxillary sinusitis, throat pain related to postnasal drainage, and acute bronchitis.  Cover with doxycycline 1 tablet twice daily for 10 days.  For nasal symptom management and cough Bromfed 3 times daily as needed.  Follow-up with primary care provider if symptoms do not resolve with current course of treatment.  ER precautions if severe symptoms develop. Final Clinical Impressions(s) / UC Diagnoses   Final diagnoses:  Acute non-recurrent maxillary sinusitis  Post-nasal drainage  Acute bronchitis, unspecified organism   Discharge Instructions   None    ED Prescriptions    Medication Sig Dispense Auth. Provider   doxycycline (VIBRAMYCIN) 100 MG capsule Take 1 capsule (100 mg total) by mouth 2 (two) times daily. 20 capsule Scot Jun, FNP   brompheniramine-pseudoephedrine-DM 30-2-10 MG/5ML syrup Take 5 mLs by  mouth 3 (three) times daily as needed. 120 mL Scot Jun, FNP     PDMP not reviewed this encounter.   Scot Jun, [place] 12/22/20 [phone]

## 2020-12-22 NOTE — ED Triage Notes (Signed)
Nasal discharge for 3 to 4 days.  Throat feels sore. Dry cough.

## 2020-12-24 ENCOUNTER — Ambulatory Visit: Payer: Medicare HMO | Admitting: Internal Medicine

## 2020-12-26 ENCOUNTER — Encounter: Payer: Self-pay | Admitting: Internal Medicine

## 2020-12-26 ENCOUNTER — Other Ambulatory Visit: Payer: Self-pay

## 2020-12-26 ENCOUNTER — Ambulatory Visit: Payer: Medicare HMO | Admitting: Internal Medicine

## 2020-12-26 VITALS — BP 117/54 | HR 70 | Ht 61.0 in | Wt 152.6 lb

## 2020-12-26 DIAGNOSIS — I10 Essential (primary) hypertension: Secondary | ICD-10-CM

## 2020-12-26 DIAGNOSIS — R079 Chest pain, unspecified: Secondary | ICD-10-CM | POA: Diagnosis not present

## 2020-12-26 DIAGNOSIS — Z79899 Other long term (current) drug therapy: Secondary | ICD-10-CM

## 2020-12-26 DIAGNOSIS — E785 Hyperlipidemia, unspecified: Secondary | ICD-10-CM

## 2020-12-26 NOTE — Progress Notes (Signed)
Cardiology Office Note:    Date:  12/26/2020   ID:  KIAMESHA SAMET, DOB 1941/05/24, MRN [id number]  PCP:  Crist Infante, MD  Cardiologist:  Elouise Munroe, MD  Electrophysiologist:  None   Referring MD: Crist Infante, MD   Chief Complaint/Reason for Referral: HTN, HLD  History of Present Illness:    Linda [name] is a 80 y.o. female with a history of obesity, hyperlipidemia, hypertension, GERD, OSA not requiring treatment given weight loss, and frequent falls. She presents for follow up.   Walking 30 mins a day. No chest pain.  BP a little low today. Home readings 130/80. No changes recommended today.   The patient denies chest pain, chest pressure, dyspnea at rest or with exertion, palpitations, PND, orthopnea, or leg swelling. Denies cough, fever, chills. Denies nausea, vomiting. Denies syncope or presyncope. Denies dizziness or lightheadedness. Denies snoring.   Past Medical History:  Diagnosis Date  . Anxiety   . Candida esophagitis ([facility])   . Chronic kidney disease   . DDD (degenerative disc disease)   . Depression   . Erosive gastritis   . Falls   . GERD (gastroesophageal reflux disease)   . Hiatal hernia   . High cholesterol   . History of anal fissures   . Hypertension   . Obesity   . Osteoarthritis   . Panic disorder   . Sleep apnea    Does not need CPAP anymore    Past Surgical History:  Procedure Laterality Date  . ABDOMINAL HYSTERECTOMY  1987   LSO / menorrhagia & leiomyomata  . APPENDECTOMY    . BREAST EXCISIONAL BIOPSY Left 1989   benign  . BREAST SURGERY  1989   benign tumor left breast  . CHOLECYSTECTOMY  2001  . TUBAL LIGATION      Current Medications: Current Meds  Medication Sig  . ALPRAZolam (XANAX) 0.5 MG tablet Take 0.5 mg by mouth daily.  . AMBULATORY NON FORMULARY MEDICATION FDGARD 2 capsules by mouth twice a day  . amLODipine (NORVASC) 10 MG tablet Take 10 mg by mouth daily.   . benazepril (LOTENSIN) 40 MG tablet Take  40 mg by mouth daily.  Marland Kitchen BIOTIN PO Take 1 tablet by mouth daily.  . brompheniramine-pseudoephedrine-DM 30-2-10 MG/5ML syrup Take 5 mLs by mouth 3 (three) times daily as needed.  . calcium carbonate (TUMS - DOSED IN MG ELEMENTAL CALCIUM) 500 MG chewable tablet Chew 1 tablet by mouth daily.  Marland Kitchen desvenlafaxine (PRISTIQ) 100 MG 24 hr tablet Take 100 mg by mouth daily.   Marland Kitchen DEXILANT 60 MG capsule TAKE ONE CAPSULE (60MG  TOTAL) BY MOUTH DAILY  . diazepam (VALIUM) 5 MG tablet SMARTSIG:1 Tablet(s) By Mouth Every Evening  . doxycycline (VIBRAMYCIN) 100 MG capsule Take 1 capsule (100 mg total) by mouth 2 (two) times daily.  . famotidine (SM ACID REDUCER MAX ST) 20 MG tablet TAKE ONE TABLET (20MG  TOTAL) BY MOUTH ATBEDTIME  . lamoTRIgine (LAMICTAL) 200 MG tablet Take 200 mg by mouth 2 (two) times daily.  Marland Kitchen loratadine (CLARITIN) 10 MG tablet Take 10 mg by mouth daily.  . ondansetron (ZOFRAN) 8 MG tablet Take 1 tablet (8 mg total) by mouth every 8 (eight) hours as needed for nausea.  . rosuvastatin (CRESTOR) 10 MG tablet   . spironolactone (ALDACTONE) 25 MG tablet Take 1 tablet (25 mg total) by mouth daily.  . [DISCONTINUED] acetaminophen-codeine (TYLENOL #3) 300-30 MG tablet Take 1-2 tablets by mouth every 8 (eight) hours as needed  for moderate pain.     Allergies:   Amoxicillin and Abilify [aripiprazole]   Social History   Tobacco Use  . Smoking status: Never Smoker  . Smokeless tobacco: Never Used  Vaping Use  . Vaping Use: Never used  Substance Use Topics  . Alcohol use: No  . Drug use: No     Family History: The patient's family history includes Diabetes in her brother and sister; Heart attack in her brother, brother, and brother; Heart disease in her brother, brother, brother, and mother; Hypertension in her sister; Lung cancer in her father; Ovarian cancer in her sister. There is no history of Colon cancer.  ROS:   Please see the history of present illness.    All other systems reviewed and  are negative.  EKGs/Labs/Other Studies Reviewed:    The following studies were reviewed today:  EKG: NSR, RBBB, no significant change from prior.  Recent Labs: No results found for requested labs within last 8760 hours.  Recent Lipid Panel No results found for: CHOL, TRIG, HDL, CHOLHDL, VLDL, LDLCALC, LDLDIRECT  Physical Exam:    VS:  BP (!) 117/54   Pulse 70   Ht 5\' 1"  (1.549 m)   Wt 152 lb 9.6 oz (69.2 kg)   SpO2 95%   BMI 28.83 kg/m     Wt Readings from Last 5 Encounters:  12/26/20 152 lb 9.6 oz (69.2 kg)  11/19/20 154 lb (69.9 kg)  10/29/20 154 lb (69.9 kg)  06/14/20 174 lb (78.9 kg)  01/10/20 180 lb (81.6 kg)    Constitutional: No acute distress Eyes: sclera non-icteric, normal conjunctiva and lids ENMT: normal dentition, moist mucous membranes Cardiovascular: regular rhythm, normal rate, no murmurs. S1 and S2 normal. Radial pulses normal bilaterally. No jugular venous distention.  Respiratory: clear to auscultation bilaterally GI : normal bowel sounds, soft and nontender. No distention.   MSK: extremities warm, well perfused. No edema.  NEURO: grossly nonfocal exam, moves all extremities. PSYCH: alert and oriented x 3, normal mood and affect.   ASSESSMENT:    1. Hypertension, unspecified type   2. Hyperlipidemia, unspecified hyperlipidemia type   3. Chest pain, unspecified type   4. Medication management    PLAN:    Hypertension, unspecified type - doing well on amlodipine and benazepril, as well as spironolactone. Feels like spironolactone helped a lot. No changes.   Hyperlipidemia, unspecified hyperlipidemia type - Plan: EKG 12-Lead - continue crestor 10 mg daily. Discussed goal LDL of <70, she is at 91. Doing well on crestor 10 mg, we participated in shared decision making and decided not to change today. Can reassess after next set of labs for any changes requiring more aggressive therapy.   Chest pain, unspecified type - no recurrence, doing well.  Negative nuclear stress test in 2021.   Total time of encounter: 20 minutes total time of encounter, including 14 minutes spent in face-to-face patient care on the date of this encounter. This time includes coordination of care and counseling regarding above mentioned problem list. Remainder of non-face-to-face time involved reviewing chart documents/testing relevant to the patient encounter and documentation in the medical record. I have independently reviewed documentation from referring provider.   Cherlynn Kaiser, MD, [facility]    Medication Adjustments/Labs and Tests Ordered: Current medicines are reviewed at length with the patient today.  Concerns regarding medicines are outlined above.   Orders Placed This Encounter  Procedures  . EKG 12-Lead  No orders of the defined types were placed in this encounter.   Patient Instructions  Medication Instructions:  No Changes In Medications at this time.  *If you need a refill on your cardiac medications before your next appointment, please call your pharmacy*  Follow-Up: At [facility], you and your health needs are our priority.  As part of our continuing mission to provide you with exceptional heart care, we have created designated Provider Care Teams.  These Care Teams include your primary Cardiologist (physician) and Advanced Practice Providers (APPs -  Physician Assistants and Nurse Practitioners) who all work together to provide you with the care you need, when you need it.  Your next appointment:   1 year(s)  The format for your next appointment:   In Person  Provider:   Cherlynn Kaiser, MD

## 2020-12-26 NOTE — Patient Instructions (Signed)

## 2021-01-13 ENCOUNTER — Ambulatory Visit: Payer: Medicare HMO | Admitting: Physician Assistant

## 2021-01-13 ENCOUNTER — Encounter: Payer: Self-pay | Admitting: Physician Assistant

## 2021-01-13 ENCOUNTER — Ambulatory Visit (INDEPENDENT_AMBULATORY_CARE_PROVIDER_SITE_OTHER): Payer: Medicare HMO

## 2021-01-13 DIAGNOSIS — G8929 Other chronic pain: Secondary | ICD-10-CM

## 2021-01-13 DIAGNOSIS — M545 Low back pain, unspecified: Secondary | ICD-10-CM

## 2021-01-13 MED ORDER — METHYLPREDNISOLONE 4 MG PO TABS: ORAL_TABLET | ORAL | 0 refills | Status: DC

## 2021-01-13 NOTE — Progress Notes (Signed)
Office Visit Note   Patient: Linda [name]           Date of Birth: 11-24-1940           MRN: [id number] Visit Date: 01/13/2021              Requested by: Crist Infante, MD [address] [place],   [address] PCP: Crist Infante, MD   Assessment & Plan: Visit Diagnoses:  1. Chronic right-sided low back pain without sciatica     Plan: We will send her to formal physical therapy for back exercises, core strengthening, hamstring stretching, IT band stretching, home exercise program and modalities.  Placed her on a Medrol Dosepak.  She can continue take her Tylenol but no NSAIDs.  Follow-up with [place] in 4 weeks if pain persist or becomes worse.  Questions were encouraged and answered at length.  Follow-Up Instructions: Return in about 4 weeks (around 02/10/2021).   Orders:  Orders Placed This Encounter  Procedures  . XR Lumbar Spine 2-3 Views   Meds ordered this encounter  Medications  . methylPREDNISolone (MEDROL) 4 MG tablet    Sig: Take as directed    Dispense:  21 tablet    Refill:  0      Procedures: No procedures performed   Clinical Data: No additional findings.   Subjective: Chief Complaint  Patient presents with  . Lower Back - Pain    HPI Mrs. Linda [name] comes in today with low back pain worse on the right.  She denies any radicular symptoms down right leg.  No groin pain.  Pain has been on and off for years.  She states her pain can be 9 out of 10 at worst.  Is worse with standing.  Does not awaken her.  No bowel bladder dysfunction.  No saddle anesthesia like symptoms.  She has tried Tylenol and heat which helps some.  No known injury.  No treatment for this.  Patient is nondiabetic Review of Systems No fevers, chills or ongoing infection.  Objective: Vital Signs: There were no vitals taken for this visit.  Physical Exam Constitutional:      Appearance: She is not ill-appearing or diaphoretic.  Cardiovascular:     Pulses: Normal pulses.   Neurological:     Mental Status: She is alert and oriented to person, place, and time.  Psychiatric:        Mood and Affect: Mood normal.     Ortho Exam Out of 5 strength throughout the lower extremities against resistance.  Negative straight leg raise bilaterally.  Tight hamstrings bilaterally.  She has tenderness over the lower lumbar paraspinous region bilaterally and over the lower lumbar spinal column.  She has limited flexion extension lumbar spine.  Sensation grossly intact bilateral feet to light touch.  Good range of motion bilateral hips without pain.  Tenderness over the trochanteric region of both hips.  Specialty Comments:  No specialty comments available.  Imaging: XR Lumbar Spine 2-3 Views  Result Date: 01/13/2021 Lumbar spine 2 views: No acute fracture.  Normal alignment.  Disc space overall well-maintained.  Lower lumbar facet changes.  Normal lordotic curvature.  Arthrosclerosis of aorta noted.  No spondylolisthesis.    PMFS History: Patient Active Problem List   Diagnosis Date Noted  . Acute right-sided low back pain with right-sided sciatica 06/13/2018  . Hypertension   . High cholesterol   . Anxiety    Past Medical History:  Diagnosis Date  . Anxiety   .  Candida esophagitis ([place])   . Chronic kidney disease   . DDD (degenerative disc disease)   . Depression   . Erosive gastritis   . Falls   . GERD (gastroesophageal reflux disease)   . Hiatal hernia   . High cholesterol   . History of anal fissures   . Hypertension   . Obesity   . Osteoarthritis   . Panic disorder   . Sleep apnea    Does not need CPAP anymore    Family History  Problem Relation Age of Onset  . Heart disease Mother   . Lung cancer Father   . Diabetes Sister   . Hypertension Sister   . Diabetes Brother   . Heart disease Brother   . Heart attack Brother   . Ovarian cancer Sister   . Heart disease Brother   . Heart attack Brother   . Heart disease Brother   . Heart attack  Brother   . Colon cancer Neg Hx     Past Surgical History:  Procedure Laterality Date  . ABDOMINAL HYSTERECTOMY  1987   LSO / menorrhagia & leiomyomata  . APPENDECTOMY    . BREAST EXCISIONAL BIOPSY Left 1989   benign  . BREAST SURGERY  1989   benign tumor left breast  . CHOLECYSTECTOMY  2001  . TUBAL LIGATION     Social History   Occupational History  . Occupation: Retired   Tobacco Use  . Smoking status: Never Smoker  . Smokeless tobacco: Never Used  Vaping Use  . Vaping Use: Never used  Substance and Sexual Activity  . Alcohol use: No  . Drug use: No  . Sexual activity: Yes

## 2021-01-13 NOTE — Addendum Note (Signed)
Addended by: Robyne Peers on: 01/13/2021 10:11 AM   Modules accepted: Orders

## 2021-01-27 ENCOUNTER — Encounter: Payer: Self-pay | Admitting: Physician Assistant

## 2021-01-27 ENCOUNTER — Ambulatory Visit (INDEPENDENT_AMBULATORY_CARE_PROVIDER_SITE_OTHER): Payer: Medicare HMO | Admitting: Physician Assistant

## 2021-01-27 DIAGNOSIS — M1711 Unilateral primary osteoarthritis, right knee: Secondary | ICD-10-CM

## 2021-01-27 DIAGNOSIS — M170 Bilateral primary osteoarthritis of knee: Secondary | ICD-10-CM | POA: Diagnosis not present

## 2021-01-27 DIAGNOSIS — M1712 Unilateral primary osteoarthritis, left knee: Secondary | ICD-10-CM | POA: Diagnosis not present

## 2021-01-27 MED ORDER — LIDOCAINE HCL 1 % IJ SOLN
0.5000 mL | INTRAMUSCULAR | Status: AC | PRN
  Administered 2021-01-27: .5 mL

## 2021-01-27 MED ORDER — METHYLPREDNISOLONE ACETATE 40 MG/ML IJ SUSP
40.0000 mg | INTRAMUSCULAR | Status: AC | PRN
  Administered 2021-01-27: 40 mg via INTRA_ARTICULAR

## 2021-01-27 NOTE — Progress Notes (Signed)
   Procedure Note  Patient: Linda [name]             Date of Birth: 10-27-1940           MRN: [id number]             Visit Date: 01/27/2021 HPI: [name] comes in today requesting repeat injections both knees.  She last had injections 09/17/2020 states that he did well until about 2 weeks ago.  She has had no new injury to either knee.  She does have known osteoarthritis of both knees.  Review of systems: Denies any injury, fevers, chills or recent vaccines.  Physical exam: Bilateral knees good range of motion  knees no abnormal warmth erythema or effusion.  Procedures: Visit Diagnoses:  1. Primary osteoarthritis of right knee   2. Primary osteoarthritis of left knee     Large Joint Inj: bilateral knee on 01/27/2021 9:41 AM Indications: pain Details: 22 G 1.5 in needle, anterolateral approach  Arthrogram: No  Medications (Right): 0.5 mL lidocaine 1 %; 40 mg methylPREDNISolone acetate 40 MG/ML Medications (Left): 0.5 mL lidocaine 1 %; 40 mg methylPREDNISolone acetate 40 MG/ML Outcome: tolerated well, no immediate complications Procedure, treatment alternatives, risks and benefits explained, specific risks discussed. Consent was given by the patient. Immediately prior to procedure a time out was called to verify the correct patient, procedure, equipment, support staff and site/side marked as required. Patient was prepped and draped in the usual sterile fashion.    Plan: She will follow up with [place] as needed.  She understands to wait at least 3 months between injections.  Questions encouraged and answered at length.

## 2021-01-28 ENCOUNTER — Ambulatory Visit (HOSPITAL_COMMUNITY): Payer: Medicare HMO | Attending: Physician Assistant | Admitting: Physical Therapy

## 2021-01-28 ENCOUNTER — Other Ambulatory Visit: Payer: Self-pay

## 2021-01-28 ENCOUNTER — Encounter (HOSPITAL_COMMUNITY): Payer: Self-pay | Admitting: Physical Therapy

## 2021-01-28 DIAGNOSIS — M545 Low back pain, unspecified: Secondary | ICD-10-CM | POA: Diagnosis not present

## 2021-01-28 DIAGNOSIS — M25561 Pain in right knee: Secondary | ICD-10-CM

## 2021-01-28 DIAGNOSIS — M25562 Pain in left knee: Secondary | ICD-10-CM | POA: Insufficient documentation

## 2021-01-28 NOTE — Therapy (Signed)
[place], [place], [address] Phone: [phone]   Fax:  [phone]  Physical Therapy Evaluation  Patient Details  Name: Linda [name] MRN: [id number] Date of Birth: Jun 07, 1941 Referring Provider (PT): Erskine Emery PA-C   Encounter Date: 01/28/2021   PT End of Session - 01/28/21 1045    Visit Number 1    Number of Visits 8    Date for PT Re-Evaluation 02/25/21    Authorization Type [employer]    Authorization Time Period check auth    PT Start Time 1034    PT Stop Time 1115    PT Time Calculation (min) 41 min    Activity Tolerance Patient tolerated treatment well    Behavior During Therapy [facility] for tasks assessed/performed           Past Medical History:  Diagnosis Date  . Anxiety   . Candida esophagitis ([place])   . Chronic kidney disease   . DDD (degenerative disc disease)   . Depression   . Erosive gastritis   . Falls   . GERD (gastroesophageal reflux disease)   . Hiatal hernia   . High cholesterol   . History of anal fissures   . Hypertension   . Obesity   . Osteoarthritis   . Panic disorder   . Sleep apnea    Does not need CPAP anymore    Past Surgical History:  Procedure Laterality Date  . ABDOMINAL HYSTERECTOMY  1987   LSO / menorrhagia & leiomyomata  . APPENDECTOMY    . BREAST EXCISIONAL BIOPSY Left 1989   benign  . BREAST SURGERY  1989   benign tumor left breast  . CHOLECYSTECTOMY  2001  . TUBAL LIGATION      There were no vitals filed for this visit.    Subjective Assessment - 01/28/21 1043    Subjective Patient presents to therapy with complaint of bilateral knee pain. She was recently sent referral for LBP, but states her back has not been bothering her much lately and she would like to focus on knee pain which has been bothering her more lately. Patient states knee pain is chronic in nature and progressively worsening. She says she recently had injections, which  she found helpful. She describes taking various medication, and states she is tapering down form Xanax currently which she feels may be causing her to shake/ tremor. She has made this known to MD.    Limitations Lifting;Standing;Walking;House hold activities    How long can you walk comfortably? 30 minutes    Patient Stated Goals Get my aches and pain in better shape    Currently in Pain? Yes   denies back pain presently   Pain Score 5     Pain Location Knee    Pain Orientation Right;Left    Pain Descriptors / Indicators Aching    Pain Type Chronic pain    Pain Onset More than a month ago    Pain Frequency Intermittent    Aggravating Factors  WB, walking, squatting    Pain Relieving Factors tylenol, biofreeze, injections    Effect of Pain on Daily Activities Limits              [facility] PT Assessment - 01/28/21 0001      Assessment   Medical Diagnosis Chronic right-sided low back pain without sciatica (PT diagnosis bilat knee pain)    Referring Provider (PT) Erskine Emery PA-C    Onset Date/Surgical  Date --   Chronic   Prior Therapy Yes      Restrictions   Weight Bearing Restrictions No      Balance Screen   Has the patient fallen in the past 6 months No      [place] residence    Living Arrangements Spouse/significant other      Prior Function   Level of Independence Independent      Cognition   Overall Cognitive Status Within Functional Limits for tasks assessed      Observation/Other Assessments   Focus on Therapeutic Outcomes (FOTO)  set up for knees, complete next visit      ROM / Strength   AROM / PROM / Strength AROM;Strength      AROM   AROM Assessment Site Knee    Right/Left Knee Right;Left    Right Knee Extension 8    Right Knee Flexion 120    Left Knee Extension 5    Left Knee Flexion 120      Strength   Strength Assessment Site Hip;Knee;Ankle    Right/Left Hip Right;Left    Right Hip Flexion 4+/5    Right Hip  Extension 3-/5    Right Hip ABduction 4/5    Left Hip Flexion 4+/5    Left Hip Extension 4/5    Left Hip ABduction 4+/5    Right/Left Knee Right;Left    Right Knee Extension 4+/5    Left Knee Extension 4+/5    Right/Left Ankle Right;Left    Right Ankle Dorsiflexion 4+/5    Left Ankle Dorsiflexion 4+/5      Flexibility   Soft Tissue Assessment /Muscle Length yes    Hamstrings mod restriction in bilateral HS      Palpation   Palpation comment Min TTP about LT patellar tendon, no TTP about lumbar      Transfers   Five time sit to stand comments  11.8 sec with minimal use of UEs                      Objective measurements completed on examination: See above findings.       [place] Adult PT Treatment/Exercise - 01/28/21 0001      Exercises   Exercises Knee/Hip      Knee/Hip Exercises: Stretches   Passive Hamstring Stretch Both;2 reps;10 seconds    Passive Hamstring Stretch Limitations with strap      Knee/Hip Exercises: Supine   Quad Sets Both;5 reps    Heel Slides Both;5 reps                  PT Education - 01/28/21 1047    Education Details on evaluation findings, POC and HEP    Person(s) Educated Patient    Methods Explanation;Handout    Comprehension Verbalized understanding            PT Short Term Goals - 01/28/21 1117      PT SHORT TERM GOAL #1   Title Patient will be independent with initial HEP and self-management strategies to improve functional outcomes    Time 2    Period Weeks    Status New    Target Date 02/11/21             PT Long Term Goals - 01/28/21 1155      PT LONG TERM GOAL #1   Title Patient will report at least 75% overall improvement in subjective complaint to indicate  improvement in ability to perform ADLs.    Time 4    Period Weeks    Status New    Target Date 02/25/21      PT LONG TERM GOAL #2   Title Patient will improve FOTO score to by 5% to indicate improvement in functional outcomes    Time 4     Period Weeks    Status New    Target Date 02/25/21      PT LONG TERM GOAL #3   Title Patient will be able to stand/ walk > 30 minutes with knee pain not to exceed 3/10 to improve ability to walk and exercise outdoors    Time 4    Period Weeks    Status New    Target Date 02/25/21      PT LONG TERM GOAL #4   Title Patient will have no knee pain at rest for improved baseline function and quality of life.    Time 4    Period Weeks    Status New    Target Date 02/25/21                  Plan - 01/28/21 1114    Clinical Impression Statement Patient is a 80 y.o. female who presents to physical therapy with complaint of bilateral knee pain. Patient demonstrates decreased strength, ROM restriction, reduced flexibility and increased tenderness to palpation which are likely contributing to symptoms of pain and are negatively impacting patient ability to perform ADLs and functional mobility tasks. Patient will benefit from skilled physical therapy services to address these deficits to reduce pain and improve level of function with ADLs and functional mobility tasks.    Personal Factors and Comorbidities Age;Time since onset of injury/illness/exacerbation    Examination-Activity Limitations Lift;Stand;Locomotion Level;Transfers;Squat;Stairs    Examination-Participation Restrictions Cleaning;Yard Work;Community Activity;Laundry    Stability/Clinical Decision Making Stable/Uncomplicated    Clinical Decision Making Low    Rehab Potential Good    PT Frequency 2x / week    PT Duration 4 weeks    PT Treatment/Interventions ADLs/Self Care Home Management;Aquatic Therapy;Biofeedback;Cryotherapy;Electrical Stimulation;Contrast Bath;Fluidtherapy;Therapeutic activities;Therapeutic exercise;Patient/family education;Orthotic Fit/Training;Manual techniques;Manual lymph drainage;Compression bandaging;Splinting;Taping;Vasopneumatic Device;Joint Manipulations;Spinal Manipulations;Energy conservation;Dry  needling;Scar mobilization;Iontophoresis 4mg /ml Dexamethasone;Moist Heat;Traction;Ultrasound;Parrafin;DME Instruction;Gait training;Stair training;Functional mobility training;Balance training;Neuromuscular re-education;Passive range of motion;Vestibular;Visual/perceptual remediation/compensation    PT Next Visit Plan FOTO (needed set up for knee). Progress hip and knee strength as tolerated. Try bridge, clam, SLR. Shift to standing when ready, add core work.    PT Home Exercise Plan Eval: quad set, heel slide, HS stretch    Consulted and Agree with Plan of Care Patient           Patient will benefit from skilled therapeutic intervention in order to improve the following deficits and impairments:  Pain,Improper body mechanics,Increased fascial restricitons,Abnormal gait,Difficulty walking,Impaired flexibility,Decreased strength,Decreased range of motion,Decreased activity tolerance,Decreased mobility,Hypomobility  Visit Diagnosis: Right knee pain, unspecified chronicity  Left knee pain, unspecified chronicity  Low back pain, unspecified back pain laterality, unspecified chronicity, unspecified whether sciatica present     Problem List Patient Active Problem List   Diagnosis Date Noted  . Acute right-sided low back pain with right-sided sciatica 06/13/2018  . Hypertension   . High cholesterol   . Anxiety    2:58 PM, 01/28/21 Josue Hector PT DPT  Physical Therapist with [facility]  [phone] [place] [address] [place], [place], [address] Phone: [phone]  Fax:  [phone]  Name: SINIYAH EVANGELIST MRN: [id number] Date of Birth: 17-Oct-1941

## 2021-01-28 NOTE — Patient Instructions (Signed)
Access Code: [email or web address] URL: [email or web address] Date: 01/28/2021 Prepared by: Josue Hector  Exercises Supine Quad Set - 3 x daily - 7 x weekly - 1 sets - 10 reps - 5 seconds hold Supine Heel Slide - 3 x daily - 7 x weekly - 1 sets - 10 reps - 5 seconds hold Supine Hamstring Stretch with Strap - 3 x daily - 7 x weekly - 1 sets - 10 reps - 10 seconds hold

## 2021-01-29 DIAGNOSIS — R251 Tremor, unspecified: Secondary | ICD-10-CM | POA: Diagnosis not present

## 2021-01-29 DIAGNOSIS — F339 Major depressive disorder, recurrent, unspecified: Secondary | ICD-10-CM | POA: Diagnosis not present

## 2021-01-30 ENCOUNTER — Ambulatory Visit (HOSPITAL_COMMUNITY): Payer: Medicare HMO | Admitting: Physical Therapy

## 2021-01-30 ENCOUNTER — Encounter (HOSPITAL_COMMUNITY): Payer: Self-pay | Admitting: Physical Therapy

## 2021-01-30 ENCOUNTER — Other Ambulatory Visit: Payer: Self-pay

## 2021-01-30 DIAGNOSIS — M545 Low back pain, unspecified: Secondary | ICD-10-CM

## 2021-01-30 DIAGNOSIS — M25561 Pain in right knee: Secondary | ICD-10-CM

## 2021-01-30 DIAGNOSIS — M25562 Pain in left knee: Secondary | ICD-10-CM

## 2021-01-30 NOTE — Therapy (Signed)
[facility] [address] [place], [place], [address] Phone: [phone]   Fax:  [phone]  Physical Therapy Treatment  Patient Details  Name: Linda [name] MRN: [id number] Date of Birth: 1941/09/02 Referring Provider (PT): Erskine Emery PA-C   Encounter Date: 01/30/2021   PT End of Session - 01/30/21 1128    Visit Number 2    Number of Visits 8    Date for PT Re-Evaluation 02/25/21    Authorization Type [employer]    Authorization Time Period check auth (pending)    Progress Note Due on Visit 8    PT Start Time 1124    PT Stop Time 1205    PT Time Calculation (min) 41 min    Activity Tolerance Patient tolerated treatment well    Behavior During Therapy [facility] for tasks assessed/performed           Past Medical History:  Diagnosis Date  . Anxiety   . Candida esophagitis ([place])   . Chronic kidney disease   . DDD (degenerative disc disease)   . Depression   . Erosive gastritis   . Falls   . GERD (gastroesophageal reflux disease)   . Hiatal hernia   . High cholesterol   . History of anal fissures   . Hypertension   . Obesity   . Osteoarthritis   . Panic disorder   . Sleep apnea    Does not need CPAP anymore    Past Surgical History:  Procedure Laterality Date  . ABDOMINAL HYSTERECTOMY  1987   LSO / menorrhagia & leiomyomata  . APPENDECTOMY    . BREAST EXCISIONAL BIOPSY Left 1989   benign  . BREAST SURGERY  1989   benign tumor left breast  . CHOLECYSTECTOMY  2001  . TUBAL LIGATION      There were no vitals filed for this visit.   Subjective Assessment - 01/30/21 1127    Subjective Patient states she is doing well. Did the HEP was a little sore tis AM. Took a Tylenol. Pain not bad, about a 2.    Limitations Lifting;Standing;Walking;House hold activities    How long can you walk comfortably? 30 minutes    Patient Stated Goals Get my aches and pain in better shape    Currently in Pain? Yes    Pain  Score 2     Pain Location Back    Pain Orientation Posterior;Lower    Pain Descriptors / Indicators Aching    Pain Type Chronic pain    Pain Onset More than a month ago    Pain Frequency Intermittent              [facility] PT Assessment - 01/30/21 0001      Observation/Other Assessments   Focus on Therapeutic Outcomes (FOTO)  79% function (knees)                         [facility] Adult PT Treatment/Exercise - 01/30/21 0001      Exercises   Exercises Lumbar      Lumbar Exercises: Stretches   Passive Hamstring Stretch Both;5 reps;10 seconds    Passive Hamstring Stretch Limitations with bed sheet      Lumbar Exercises: Supine   Ab Set 10 reps;5 seconds      Knee/Hip Exercises: Supine   Quad Sets Both;1 set;10 reps    Quad Sets Limitations 5" holds    Heel Slides Both;10 reps  Bridges Both;10 reps    Straight Leg Raises Both;10 reps                    PT Short Term Goals - 01/28/21 1117      PT SHORT TERM GOAL #1   Title Patient will be independent with initial HEP and self-management strategies to improve functional outcomes    Time 2    Period Weeks    Status New    Target Date 02/11/21             PT Long Term Goals - 01/28/21 1155      PT LONG TERM GOAL #1   Title Patient will report at least 75% overall improvement in subjective complaint to indicate improvement in ability to perform ADLs.    Time 4    Period Weeks    Status New    Target Date 02/25/21      PT LONG TERM GOAL #2   Title Patient will improve FOTO score to by 5% to indicate improvement in functional outcomes    Time 4    Period Weeks    Status New    Target Date 02/25/21      PT LONG TERM GOAL #3   Title Patient will be able to stand/ walk > 30 minutes with knee pain not to exceed 3/10 to improve ability to walk and exercise outdoors    Time 4    Period Weeks    Status New    Target Date 02/25/21      PT LONG TERM GOAL #4   Title Patient will have no knee  pain at rest for improved baseline function and quality of life.    Time 4    Period Weeks    Status New    Target Date 02/25/21                 Plan - 01/30/21 1205    Clinical Impression Statement Patient tolerated session well today. Reviewed HEP and added LE strength progression on mat. Patient required verbal cues for proper form and rep count with added exercise. Patient reports no increased complaint of pain. Patient educated on function of all added exercises. Issued updated HEP handout. Patient will continue to benefit from skilled therapy services to decrease limitations and improve functional ability.    Personal Factors and Comorbidities Age;Time since onset of injury/illness/exacerbation    Examination-Activity Limitations Lift;Stand;Locomotion Level;Transfers;Squat;Stairs    Examination-Participation Restrictions Cleaning;Yard Work;Community Activity;Laundry    Stability/Clinical Decision Making Stable/Uncomplicated    Rehab Potential Good    PT Frequency 2x / week    PT Duration 4 weeks    PT Treatment/Interventions ADLs/Self Care Home Management;Aquatic Therapy;Biofeedback;Cryotherapy;Electrical Stimulation;Contrast Bath;Fluidtherapy;Therapeutic activities;Therapeutic exercise;Patient/family education;Orthotic Fit/Training;Manual techniques;Manual lymph drainage;Compression bandaging;Splinting;Taping;Vasopneumatic Device;Joint Manipulations;Spinal Manipulations;Energy conservation;Dry needling;Scar mobilization;Iontophoresis 4mg /ml Dexamethasone;Moist Heat;Traction;Ultrasound;Parrafin;DME Instruction;Gait training;Stair training;Functional mobility training;Balance training;Neuromuscular re-education;Passive range of motion;Vestibular;Visual/perceptual remediation/compensation    PT Next Visit Plan Progress hip and knee strength as tolerated.Progress to standing when ready, continue core work. Step ups, hip abd, heel raises.    PT Home Exercise Plan Eval: quad set, heel  slide, HS stretch 4/14: bridge, ab set, SLR    Consulted and Agree with Plan of Care Patient           Patient will benefit from skilled therapeutic intervention in order to improve the following deficits and impairments:  Pain,Improper body mechanics,Increased fascial restricitons,Abnormal gait,Difficulty walking,Impaired flexibility,Decreased strength,Decreased range of motion,Decreased activity tolerance,Decreased mobility,Hypomobility  Visit  Diagnosis: Right knee pain, unspecified chronicity  Left knee pain, unspecified chronicity  Low back pain, unspecified back pain laterality, unspecified chronicity, unspecified whether sciatica present     Problem List Patient Active Problem List   Diagnosis Date Noted  . Acute right-sided low back pain with right-sided sciatica 06/13/2018  . Hypertension   . High cholesterol   . Anxiety    5:26 PM, 01/30/21 Josue Hector PT DPT  Physical Therapist with [facility]  [phone]   [facility] [address] [place], [place], [address] Phone: [phone]   Fax:  [phone]  Name: Linda [name] MRN: [id number] Date of Birth: May 16, 1941

## 2021-01-30 NOTE — Patient Instructions (Signed)
Access Code: [email or web address] URL: [email or web address] Date: 01/30/2021 Prepared by: Josue Hector  Exercises Supine Bridge - 2 x daily - 7 x weekly - 2 sets - 10 reps - 5 seconds hold Supine Transversus Abdominis Bracing - Hands on Stomach - 2 x daily - 7 x weekly - 2 sets - 10 reps - 5 seconds hold Straight Leg Raise - 2 x daily - 7 x weekly - 2 sets - 10 reps

## 2021-02-03 ENCOUNTER — Ambulatory Visit (HOSPITAL_COMMUNITY): Payer: Medicare HMO | Admitting: Physical Therapy

## 2021-02-03 ENCOUNTER — Encounter (HOSPITAL_COMMUNITY): Payer: Self-pay | Admitting: Physical Therapy

## 2021-02-03 ENCOUNTER — Other Ambulatory Visit: Payer: Self-pay

## 2021-02-03 DIAGNOSIS — M545 Low back pain, unspecified: Secondary | ICD-10-CM | POA: Diagnosis not present

## 2021-02-03 DIAGNOSIS — M25561 Pain in right knee: Secondary | ICD-10-CM

## 2021-02-03 DIAGNOSIS — M25562 Pain in left knee: Secondary | ICD-10-CM | POA: Diagnosis not present

## 2021-02-03 NOTE — Therapy (Signed)
[facility] [place], [place], [address] Phone: [phone]   Fax:  [phone]  Physical Therapy Treatment  Patient Details  Name: Linda [name] MRN: [id number] Date of Birth: [birth year] Referring Provider (PT): Erskine Emery PA-C   Encounter Date: 02/03/2021   PT End of Session - 02/03/21 1046    Visit Number 3    Number of Visits 8    Date for PT Re-Evaluation 02/25/21    Authorization Type [employer]    Authorization Time Period 8 approved 4/12-5/10    Authorization - Visit Number 3    Authorization - Number of Visits 8    Progress Note Due on Visit 8    PT Start Time 1034    PT Stop Time 1115    PT Time Calculation (min) 41 min    Activity Tolerance Patient tolerated treatment well    Behavior During Therapy [facility] for tasks assessed/performed           Past Medical History:  Diagnosis Date  . Anxiety   . Candida esophagitis ([place])   . Chronic kidney disease   . DDD (degenerative disc disease)   . Depression   . Erosive gastritis   . Falls   . GERD (gastroesophageal reflux disease)   . Hiatal hernia   . High cholesterol   . History of anal fissures   . Hypertension   . Obesity   . Osteoarthritis   . Panic disorder   . Sleep apnea    Does not need CPAP anymore    Past Surgical History:  Procedure Laterality Date  . ABDOMINAL HYSTERECTOMY  1987   LSO / menorrhagia & leiomyomata  . APPENDECTOMY    . BREAST EXCISIONAL BIOPSY Left 1989   benign  . BREAST SURGERY  1989   benign tumor left breast  . CHOLECYSTECTOMY  2001  . TUBAL LIGATION      There were no vitals filed for this visit.   Subjective Assessment - 02/03/21 1039    Subjective Patient says her back was sore on Friday. She had some pain in her leg. She has been using a walker this weekend because sometimes it hurt to put weight through LT leg. She denies any change in activity level. She is feeling better today but was stiff  this morning. Better once she is up and moving around.    Limitations Lifting;Standing;Walking;House hold activities    How long can you walk comfortably? 30 minutes    Patient Stated Goals Get my aches and pain in better shape    Currently in Pain? Yes    Pain Score 6     Pain Location Hip    Pain Orientation Posterior;Left    Pain Descriptors / Indicators Sharp    Pain Type Chronic pain    Pain Onset More than a month ago    Pain Frequency Intermittent                             [facility] Adult PT Treatment/Exercise - 02/03/21 0001      Lumbar Exercises: Stretches   Lower Trunk Rotation 5 reps;10 seconds      Lumbar Exercises: Supine   Ab Set 10 reps;5 seconds;20 reps    Bridge 20 reps    Straight Leg Raise 10 reps    Other Supine Lumbar Exercises iso hip abduction/ adduction 2 x10 with 5" holds  Manual Therapy   Manual Therapy Soft tissue mobilization    Manual therapy comments Completed separate form all other activity    Soft tissue mobilization STM using tennis ball to LT posterior hip/ low lumbar patient in prone                    PT Short Term Goals - 01/28/21 1117      PT SHORT TERM GOAL #1   Title Patient will be independent with initial HEP and self-management strategies to improve functional outcomes    Time 2    Period Weeks    Status New    Target Date 02/11/21             PT Long Term Goals - 01/28/21 1155      PT LONG TERM GOAL #1   Title Patient will report at least 75% overall improvement in subjective complaint to indicate improvement in ability to perform ADLs.    Time 4    Period Weeks    Status New    Target Date 02/25/21      PT LONG TERM GOAL #2   Title Patient will improve FOTO score to by 5% to indicate improvement in functional outcomes    Time 4    Period Weeks    Status New    Target Date 02/25/21      PT LONG TERM GOAL #3   Title Patient will be able to stand/ walk > 30 minutes with knee pain  not to exceed 3/10 to improve ability to walk and exercise outdoors    Time 4    Period Weeks    Status New    Target Date 02/25/21      PT LONG TERM GOAL #4   Title Patient will have no knee pain at rest for improved baseline function and quality of life.    Time 4    Period Weeks    Status New    Target Date 02/25/21                 Plan - 02/03/21 1727    Clinical Impression Statement Patient tolerated session well today. She did note some "catching" behind LT knee with SLR, but no increased pain. Activity graded per patient tolerance. Added hip isometrics for improved glute activation while reducing back pain. Also added Manual STM to address pain and restriction. Patient reporting pain reduction from 6/10 to 3/10 post session and improved gait. Patient will continue to benefit from skilled therapy services to reduce limitations and improve functional ability.    Personal Factors and Comorbidities Age;Time since onset of injury/illness/exacerbation    Examination-Activity Limitations Lift;Stand;Locomotion Level;Transfers;Squat;Stairs    Examination-Participation Restrictions Cleaning;Yard Work;Community Activity;Laundry    Stability/Clinical Decision Making Stable/Uncomplicated    Rehab Potential Good    PT Frequency 2x / week    PT Duration 4 weeks    PT Treatment/Interventions ADLs/Self Care Home Management;Aquatic Therapy;Biofeedback;Cryotherapy;Electrical Stimulation;Contrast Bath;Fluidtherapy;Therapeutic activities;Therapeutic exercise;Patient/family education;Orthotic Fit/Training;Manual techniques;Manual lymph drainage;Compression bandaging;Splinting;Taping;Vasopneumatic Device;Joint Manipulations;Spinal Manipulations;Energy conservation;Dry needling;Scar mobilization;Iontophoresis 4mg /ml Dexamethasone;Moist Heat;Traction;Ultrasound;Parrafin;DME Instruction;Gait training;Stair training;Functional mobility training;Balance training;Neuromuscular re-education;Passive range of  motion;Vestibular;Visual/perceptual remediation/compensation    PT Next Visit Plan Assess response to manuals. Progress hip and knee strength as tolerated.Progress to standing when ready, continue core work. Step ups, hip abd, heel raises.    PT Home Exercise Plan Eval: quad set, heel slide, HS stretch 4/14: bridge, ab set, SLR    Consulted and Agree with Plan of Care Patient  Patient will benefit from skilled therapeutic intervention in order to improve the following deficits and impairments:  Pain,Improper body mechanics,Increased fascial restricitons,Abnormal gait,Difficulty walking,Impaired flexibility,Decreased strength,Decreased range of motion,Decreased activity tolerance,Decreased mobility,Hypomobility  Visit Diagnosis: Right knee pain, unspecified chronicity  Left knee pain, unspecified chronicity  Low back pain, unspecified back pain laterality, unspecified chronicity, unspecified whether sciatica present     Problem List Patient Active Problem List   Diagnosis Date Noted  . Acute right-sided low back pain with right-sided sciatica 06/13/2018  . Hypertension   . High cholesterol   . Anxiety    5:37 PM, 02/03/21 Josue Hector PT DPT  Physical Therapist with [facility]  [phone]   [facility] [address] [place], [place], [address] Phone: [phone]   Fax:  [phone]  Name: Linda [name] MRN: [id number] Date of Birth: 1940-12-10

## 2021-02-03 NOTE — Patient Instructions (Signed)
Access Code: [email or web address] URL: [email or web address] Date: 02/03/2021 Prepared by: Josue Hector  Exercises Supine Lower Trunk Rotation - 2 x daily - 7 x weekly - 1 sets - 10 reps - 10 sec hold

## 2021-02-05 ENCOUNTER — Encounter (HOSPITAL_COMMUNITY): Payer: Self-pay | Admitting: Physical Therapy

## 2021-02-05 ENCOUNTER — Other Ambulatory Visit: Payer: Self-pay

## 2021-02-05 ENCOUNTER — Ambulatory Visit (HOSPITAL_COMMUNITY): Payer: Medicare HMO | Admitting: Physical Therapy

## 2021-02-05 DIAGNOSIS — M545 Low back pain, unspecified: Secondary | ICD-10-CM | POA: Diagnosis not present

## 2021-02-05 DIAGNOSIS — M25561 Pain in right knee: Secondary | ICD-10-CM | POA: Diagnosis not present

## 2021-02-05 DIAGNOSIS — M25562 Pain in left knee: Secondary | ICD-10-CM

## 2021-02-05 NOTE — Patient Instructions (Signed)
Access Code: [email or web address] URL: [email or web address] Date: 02/05/2021 Prepared by: Josue Hector  Exercises Heel rises with counter support - 2 x daily - 7 x weekly - 2 sets - 10 reps Standing Hip Abduction with Counter Support - 2 x daily - 7 x weekly - 2 sets - 10 reps Standing Tandem Balance with Counter Support - 2 x daily - 7 x weekly - 1 sets - 3 reps - 20-30 second hold

## 2021-02-05 NOTE — Therapy (Signed)
[facility] [place], [place], [address] Phone: [phone]   Fax:  [phone]  Physical Therapy Treatment  Patient Details  Name: Linda [name] MRN: [id number] Date of Birth: [birth year] Referring Provider (PT): Erskine Emery PA-C   Encounter Date: 02/05/2021   PT End of Session - 02/05/21 1123    Visit Number 4    Number of Visits 8    Date for PT Re-Evaluation 02/25/21    Authorization Type [employer]    Authorization Time Period 8 approved 4/12-5/10    Authorization - Visit Number 4    Authorization - Number of Visits 8    Progress Note Due on Visit 8    PT Start Time 1118    PT Stop Time 1157    PT Time Calculation (min) 39 min    Activity Tolerance Patient tolerated treatment well    Behavior During Therapy [facility] for tasks assessed/performed           Past Medical History:  Diagnosis Date  . Anxiety   . Candida esophagitis ([place])   . Chronic kidney disease   . DDD (degenerative disc disease)   . Depression   . Erosive gastritis   . Falls   . GERD (gastroesophageal reflux disease)   . Hiatal hernia   . High cholesterol   . History of anal fissures   . Hypertension   . Obesity   . Osteoarthritis   . Panic disorder   . Sleep apnea    Does not need CPAP anymore    Past Surgical History:  Procedure Laterality Date  . ABDOMINAL HYSTERECTOMY  1987   LSO / menorrhagia & leiomyomata  . APPENDECTOMY    . BREAST EXCISIONAL BIOPSY Left 1989   benign  . BREAST SURGERY  1989   benign tumor left breast  . CHOLECYSTECTOMY  2001  . TUBAL LIGATION      There were no vitals filed for this visit.   Subjective Assessment - 02/05/21 1122    Subjective Patient says she is doing better today. No pain currently. She is walking better.  A little sore yesterday but much better today.    Limitations Lifting;Standing;Walking;House hold activities    How long can you walk comfortably? 30 minutes    Patient  Stated Goals Get my aches and pain in better shape    Currently in Pain? No/denies    Pain Onset More than a month ago                             [facility] Adult PT Treatment/Exercise - 02/05/21 0001      Lumbar Exercises: Supine   Ab Set 5 seconds;15 reps    Bent Knee Raise 20 reps    Bridge 20 reps    Straight Leg Raise 15 reps    Other Supine Lumbar Exercises iso hip abduction/ adduction x15 each with 5" holds      Knee/Hip Exercises: Standing   Heel Raises Both;2 sets;10 reps    Hip Abduction Both;2 sets;10 reps    Forward Step Up Both;2 sets;10 reps;Hand Hold: 2;Step Height: 4"    Other Standing Knee Exercises 4 inch box step taps x20 HHA x 1    Other Standing Knee Exercises tandem stance 2 x 30" HHA x1                    PT Short  Term Goals - 01/28/21 1117      PT SHORT TERM GOAL #1   Title Patient will be independent with initial HEP and self-management strategies to improve functional outcomes    Time 2    Period Weeks    Status New    Target Date 02/11/21             PT Long Term Goals - 01/28/21 1155      PT LONG TERM GOAL #1   Title Patient will report at least 75% overall improvement in subjective complaint to indicate improvement in ability to perform ADLs.    Time 4    Period Weeks    Status New    Target Date 02/25/21      PT LONG TERM GOAL #2   Title Patient will improve FOTO score to by 5% to indicate improvement in functional outcomes    Time 4    Period Weeks    Status New    Target Date 02/25/21      PT LONG TERM GOAL #3   Title Patient will be able to stand/ walk > 30 minutes with knee pain not to exceed 3/10 to improve ability to walk and exercise outdoors    Time 4    Period Weeks    Status New    Target Date 02/25/21      PT LONG TERM GOAL #4   Title Patient will have no knee pain at rest for improved baseline function and quality of life.    Time 4    Period Weeks    Status New    Target Date 02/25/21                  Plan - 02/05/21 1329    Clinical Impression Statement Patient tolerated session well today. Progressed to standing strengthening and static balance activity. Patient was well challenged with balance. She requires verbal cues for upright posturing. Patient also cued on foot placement with step taps. Issued updated HEP handout. Patient will continue to benefit from skilled therapy services to reduce limitations and improve functional ability.    Personal Factors and Comorbidities Age;Time since onset of injury/illness/exacerbation    Examination-Activity Limitations Lift;Stand;Locomotion Level;Transfers;Squat;Stairs    Examination-Participation Restrictions Cleaning;Yard Work;Community Activity;Laundry    Stability/Clinical Decision Making Stable/Uncomplicated    Rehab Potential Good    PT Frequency 2x / week    PT Duration 4 weeks    PT Treatment/Interventions ADLs/Self Care Home Management;Aquatic Therapy;Biofeedback;Cryotherapy;Electrical Stimulation;Contrast Bath;Fluidtherapy;Therapeutic activities;Therapeutic exercise;Patient/family education;Orthotic Fit/Training;Manual techniques;Manual lymph drainage;Compression bandaging;Splinting;Taping;Vasopneumatic Device;Joint Manipulations;Spinal Manipulations;Energy conservation;Dry needling;Scar mobilization;Iontophoresis 4mg /ml Dexamethasone;Moist Heat;Traction;Ultrasound;Parrafin;DME Instruction;Gait training;Stair training;Functional mobility training;Balance training;Neuromuscular re-education;Passive range of motion;Vestibular;Visual/perceptual remediation/compensation    PT Next Visit Plan Continue to progress hip and knee strength as tolerated. Continue core work and standing strengthening. Lateral step ups, sidestepping    PT Home Exercise Plan Eval: quad set, heel slide, HS stretch 4/14: bridge, ab set, SLR 4/20 heel raise, standing hip abduction, tandem stance    Consulted and Agree with Plan of Care Patient            Patient will benefit from skilled therapeutic intervention in order to improve the following deficits and impairments:  Pain,Improper body mechanics,Increased fascial restricitons,Abnormal gait,Difficulty walking,Impaired flexibility,Decreased strength,Decreased range of motion,Decreased activity tolerance,Decreased mobility,Hypomobility  Visit Diagnosis: Right knee pain, unspecified chronicity  Left knee pain, unspecified chronicity  Low back pain, unspecified back pain laterality, unspecified chronicity, unspecified whether sciatica present     Problem List  Patient Active Problem List   Diagnosis Date Noted  . Acute right-sided low back pain with right-sided sciatica 06/13/2018  . Hypertension   . High cholesterol   . Anxiety     1:33 PM, 02/05/21 Josue Hector PT DPT  Physical Therapist with [facility]  [phone]   [facility] [address] [place], [place], [address] Phone: [phone]   Fax:  [phone]  Name: Linda [name] MRN: [id number] Date of Birth: [birth year]

## 2021-02-10 ENCOUNTER — Encounter (HOSPITAL_COMMUNITY): Payer: Self-pay | Admitting: Physical Therapy

## 2021-02-10 ENCOUNTER — Other Ambulatory Visit: Payer: Self-pay

## 2021-02-10 ENCOUNTER — Ambulatory Visit (HOSPITAL_COMMUNITY): Payer: Medicare HMO | Admitting: Physical Therapy

## 2021-02-10 DIAGNOSIS — M545 Low back pain, unspecified: Secondary | ICD-10-CM | POA: Diagnosis not present

## 2021-02-10 DIAGNOSIS — M25561 Pain in right knee: Secondary | ICD-10-CM

## 2021-02-10 DIAGNOSIS — M25562 Pain in left knee: Secondary | ICD-10-CM

## 2021-02-10 NOTE — Therapy (Signed)
[place], [place], [address] Phone: [phone]   Fax:  [phone]  Physical Therapy Treatment  Patient Details  Name: Linda [name] MRN: [id number] Date of Birth: 04/19/1941 Referring Provider (PT): Erskine Emery PA-C   Encounter Date: 02/10/2021   PT End of Session - 02/10/21 1124    Visit Number 5    Number of Visits 8    Date for PT Re-Evaluation 02/25/21    Authorization Type [employer]    Authorization Time Period 8 approved 4/12-5/10    Authorization - Visit Number 5    Authorization - Number of Visits 8    Progress Note Due on Visit 8    PT Start Time 1118    PT Stop Time 1159    PT Time Calculation (min) 41 min    Activity Tolerance Patient tolerated treatment well    Behavior During Therapy [facility] for tasks assessed/performed           Past Medical History:  Diagnosis Date  . Anxiety   . Candida esophagitis ([place])   . Chronic kidney disease   . DDD (degenerative disc disease)   . Depression   . Erosive gastritis   . Falls   . GERD (gastroesophageal reflux disease)   . Hiatal hernia   . High cholesterol   . History of anal fissures   . Hypertension   . Obesity   . Osteoarthritis   . Panic disorder   . Sleep apnea    Does not need CPAP anymore    Past Surgical History:  Procedure Laterality Date  . ABDOMINAL HYSTERECTOMY  1987   LSO / menorrhagia & leiomyomata  . APPENDECTOMY    . BREAST EXCISIONAL BIOPSY Left 1989   benign  . BREAST SURGERY  1989   benign tumor left breast  . CHOLECYSTECTOMY  2001  . TUBAL LIGATION      There were no vitals filed for this visit.   Subjective Assessment - 02/10/21 1123    Subjective Patient says she is doing much better. She says her knees are feeling great. She has a slight pain in her low back on left side "about a 2".    Limitations Lifting;Standing;Walking;House hold activities    How long can you walk comfortably? 30 minutes     Patient Stated Goals Get my aches and pain in better shape    Currently in Pain? Yes    Pain Score 2     Pain Location Back    Pain Orientation Posterior;Left;Lower    Pain Descriptors / Indicators Aching    Pain Onset More than a month ago    Pain Frequency Intermittent                             [facility] Adult PT Treatment/Exercise - 02/10/21 0001      Lumbar Exercises: Supine   Ab Set 5 seconds;15 reps    Bent Knee Raise 20 reps    Bridge 20 reps    Straight Leg Raise 10 reps    Other Supine Lumbar Exercises iso hip abduction/ adduction x10 each with 5" holds      Knee/Hip Exercises: Standing   Heel Raises Both;15 reps    Lateral Step Up Both;2 sets;10 reps;Hand Hold: 2;Step Height: 4"    Forward Step Up Both;2 sets;10 reps;Step Height: 4";Hand Hold: 1    Other Standing Knee Exercises 4  inch box step taps x20 HHA x 1, sidestepping in // bars 5 RT    Other Standing Knee Exercises tandem stance 2 x 30" int HHA                    PT Short Term Goals - 01/28/21 1117      PT SHORT TERM GOAL #1   Title Patient will be independent with initial HEP and self-management strategies to improve functional outcomes    Time 2    Period Weeks    Status New    Target Date 02/11/21             PT Long Term Goals - 01/28/21 1155      PT LONG TERM GOAL #1   Title Patient will report at least 75% overall improvement in subjective complaint to indicate improvement in ability to perform ADLs.    Time 4    Period Weeks    Status New    Target Date 02/25/21      PT LONG TERM GOAL #2   Title Patient will improve FOTO score to by 5% to indicate improvement in functional outcomes    Time 4    Period Weeks    Status New    Target Date 02/25/21      PT LONG TERM GOAL #3   Title Patient will be able to stand/ walk > 30 minutes with knee pain not to exceed 3/10 to improve ability to walk and exercise outdoors    Time 4    Period Weeks    Status New     Target Date 02/25/21      PT LONG TERM GOAL #4   Title Patient will have no knee pain at rest for improved baseline function and quality of life.    Time 4    Period Weeks    Status New    Target Date 02/25/21                 Plan - 02/10/21 1729    Clinical Impression Statement Patient tolerated session well today with no increased complaint of pain. Patient continues to be challenged with static balance. Patient also had increased difficulty sequencing forward step up today. Required frequent verbal cues for correction, and reminder to use decreased HHA for increased balance. Patient will continue to benefit from skilled therapy service to reduce deficits and improve functional ability.    Personal Factors and Comorbidities Age;Time since onset of injury/illness/exacerbation    Examination-Activity Limitations Lift;Stand;Locomotion Level;Transfers;Squat;Stairs    Examination-Participation Restrictions Cleaning;Yard Work;Community Activity;Laundry    Stability/Clinical Decision Making Stable/Uncomplicated    Rehab Potential Good    PT Frequency 2x / week    PT Duration 4 weeks    PT Treatment/Interventions ADLs/Self Care Home Management;Aquatic Therapy;Biofeedback;Cryotherapy;Electrical Stimulation;Contrast Bath;Fluidtherapy;Therapeutic activities;Therapeutic exercise;Patient/family education;Orthotic Fit/Training;Manual techniques;Manual lymph drainage;Compression bandaging;Splinting;Taping;Vasopneumatic Device;Joint Manipulations;Spinal Manipulations;Energy conservation;Dry needling;Scar mobilization;Iontophoresis 4mg /ml Dexamethasone;Moist Heat;Traction;Ultrasound;Parrafin;DME Instruction;Gait training;Stair training;Functional mobility training;Balance training;Neuromuscular re-education;Passive range of motion;Vestibular;Visual/perceptual remediation/compensation    PT Next Visit Plan Continue to progress hip and knee strength as tolerated. Continue core work and standing  strengthening. Band hip abd/ ext. Try single limb stance    PT Home Exercise Plan Eval: quad set, heel slide, HS stretch 4/14: bridge, ab set, SLR 4/20 heel raise, standing hip abduction, tandem stance    Consulted and Agree with Plan of Care Patient           Patient will benefit from skilled therapeutic intervention in  order to improve the following deficits and impairments:  Pain,Improper body mechanics,Increased fascial restricitons,Abnormal gait,Difficulty walking,Impaired flexibility,Decreased strength,Decreased range of motion,Decreased activity tolerance,Decreased mobility,Hypomobility  Visit Diagnosis: Right knee pain, unspecified chronicity  Left knee pain, unspecified chronicity  Low back pain, unspecified back pain laterality, unspecified chronicity, unspecified whether sciatica present     Problem List Patient Active Problem List   Diagnosis Date Noted  . Acute right-sided low back pain with right-sided sciatica 06/13/2018  . Hypertension   . High cholesterol   . Anxiety     5:34 PM, 02/10/21 Josue Hector PT DPT  Physical Therapist with [facility]  [phone]   [facility] [address] [place], [place], [address] Phone: [phone]   Fax:  [phone]  Name: Linda [name] MRN: [id number] Date of Birth: Nov 20, 1940

## 2021-02-12 ENCOUNTER — Encounter (HOSPITAL_COMMUNITY): Payer: Self-pay | Admitting: Physical Therapy

## 2021-02-12 ENCOUNTER — Ambulatory Visit (HOSPITAL_COMMUNITY): Payer: Medicare HMO | Admitting: Physical Therapy

## 2021-02-12 ENCOUNTER — Other Ambulatory Visit: Payer: Self-pay

## 2021-02-12 DIAGNOSIS — M545 Low back pain, unspecified: Secondary | ICD-10-CM | POA: Diagnosis not present

## 2021-02-12 DIAGNOSIS — M25562 Pain in left knee: Secondary | ICD-10-CM | POA: Diagnosis not present

## 2021-02-12 DIAGNOSIS — M25561 Pain in right knee: Secondary | ICD-10-CM | POA: Diagnosis not present

## 2021-02-12 NOTE — Therapy (Signed)
[place], [place], [address] Phone: [phone]   Fax:  [phone]  Physical Therapy Treatment  Patient Details  Name: Linda [name] MRN: [id number] Date of Birth: Oct 07, 1941 Referring Provider (PT): Erskine Emery PA-C   Encounter Date: 02/12/2021   PT End of Session - 02/12/21 1121    Visit Number 6    Number of Visits 8    Date for PT Re-Evaluation 02/25/21    Authorization Type [employer]    Authorization Time Period 8 approved 4/12-5/10    Authorization - Visit Number 6    Authorization - Number of Visits 8    Progress Note Due on Visit 8    PT Start Time 1117    PT Stop Time 1159    PT Time Calculation (min) 42 min    Activity Tolerance Patient tolerated treatment well    Behavior During Therapy [facility] for tasks assessed/performed           Past Medical History:  Diagnosis Date  . Anxiety   . Candida esophagitis ([place])   . Chronic kidney disease   . DDD (degenerative disc disease)   . Depression   . Erosive gastritis   . Falls   . GERD (gastroesophageal reflux disease)   . Hiatal hernia   . High cholesterol   . History of anal fissures   . Hypertension   . Obesity   . Osteoarthritis   . Panic disorder   . Sleep apnea    Does not need CPAP anymore    Past Surgical History:  Procedure Laterality Date  . ABDOMINAL HYSTERECTOMY  1987   LSO / menorrhagia & leiomyomata  . APPENDECTOMY    . BREAST EXCISIONAL BIOPSY Left 1989   benign  . BREAST SURGERY  1989   benign tumor left breast  . CHOLECYSTECTOMY  2001  . TUBAL LIGATION      There were no vitals filed for this visit.   Subjective Assessment - 02/12/21 1120    Subjective Patient reports no new issues, no pain. Says she is doing much better.    Limitations Lifting;Standing;Walking;House hold activities    How long can you walk comfortably? 30 minutes    Patient Stated Goals Get my aches and pain in better shape     Currently in Pain? No/denies    Pain Onset More than a month ago                             [facility] Adult PT Treatment/Exercise - 02/12/21 0001      Lumbar Exercises: Standing   Other Standing Lumbar Exercises palloff press RTB x 15 each      Lumbar Exercises: Supine   Ab Set 5 seconds;10 reps    Bent Knee Raise 20 reps    Dead Bug 10 reps    Bridge 20 reps    Straight Leg Raise 10 reps      Knee/Hip Exercises: Standing   Heel Raises Both;20 reps    Hip Abduction Both;2 sets;10 reps    Hip Extension Both;2 sets;10 reps    Lateral Step Up Both;2 sets;10 reps;Hand Hold: 2;Step Height: 4"    Forward Step Up Both;2 sets;10 reps;Step Height: 4";Hand Hold: 1    SLS 3 x 10" with HHA x 1    Other Standing Knee Exercises sidesteeping in // bars 5RT    Other Standing Knee Exercises  tandem stance 2 x 30" int HHA                    PT Short Term Goals - 01/28/21 1117      PT SHORT TERM GOAL #1   Title Patient will be independent with initial HEP and self-management strategies to improve functional outcomes    Time 2    Period Weeks    Status New    Target Date 02/11/21             PT Long Term Goals - 01/28/21 1155      PT LONG TERM GOAL #1   Title Patient will report at least 75% overall improvement in subjective complaint to indicate improvement in ability to perform ADLs.    Time 4    Period Weeks    Status New    Target Date 02/25/21      PT LONG TERM GOAL #2   Title Patient will improve FOTO score to by 5% to indicate improvement in functional outcomes    Time 4    Period Weeks    Status New    Target Date 02/25/21      PT LONG TERM GOAL #3   Title Patient will be able to stand/ walk > 30 minutes with knee pain not to exceed 3/10 to improve ability to walk and exercise outdoors    Time 4    Period Weeks    Status New    Target Date 02/25/21      PT LONG TERM GOAL #4   Title Patient will have no knee pain at rest for improved  baseline function and quality of life.    Time 4    Period Weeks    Status New    Target Date 02/25/21                 Plan - 02/12/21 1205    Clinical Impression Statement Patient tolerated session well today. Showed improved sequencing with standing exercises. Well challenged with coordinating deadbugs. Progressed core with added palloff pressing. Patient cued on posture and technique with tandem stance, showed improvement. Patient will continue to benefit form skilled therapy services to reduce deficits and improve functional ability.    Personal Factors and Comorbidities Age;Time since onset of injury/illness/exacerbation    Examination-Activity Limitations Lift;Stand;Locomotion Level;Transfers;Squat;Stairs    Examination-Participation Restrictions Cleaning;Yard Work;Community Activity;Laundry    Stability/Clinical Decision Making Stable/Uncomplicated    Rehab Potential Good    PT Frequency 2x / week    PT Duration 4 weeks    PT Treatment/Interventions ADLs/Self Care Home Management;Aquatic Therapy;Biofeedback;Cryotherapy;Electrical Stimulation;Contrast Bath;Fluidtherapy;Therapeutic activities;Therapeutic exercise;Patient/family education;Orthotic Fit/Training;Manual techniques;Manual lymph drainage;Compression bandaging;Splinting;Taping;Vasopneumatic Device;Joint Manipulations;Spinal Manipulations;Energy conservation;Dry needling;Scar mobilization;Iontophoresis 4mg /ml Dexamethasone;Moist Heat;Traction;Ultrasound;Parrafin;DME Instruction;Gait training;Stair training;Functional mobility training;Balance training;Neuromuscular re-education;Passive range of motion;Vestibular;Visual/perceptual remediation/compensation    PT Next Visit Plan Continue to progress hip and knee strength as tolerated. Continue core work and standing strengthening. Band hip abd/ ext.    PT Home Exercise Plan Eval: quad set, heel slide, HS stretch 4/14: bridge, ab set, SLR 4/20 heel raise, standing hip abduction,  tandem stance 4/27 standing hip extension, single limb stance with support    Consulted and Agree with Plan of Care Patient           Patient will benefit from skilled therapeutic intervention in order to improve the following deficits and impairments:  Pain,Improper body mechanics,Increased fascial restricitons,Abnormal gait,Difficulty walking,Impaired flexibility,Decreased strength,Decreased range of motion,Decreased activity tolerance,Decreased mobility,Hypomobility  Visit Diagnosis:  Right knee pain, unspecified chronicity  Left knee pain, unspecified chronicity  Low back pain, unspecified back pain laterality, unspecified chronicity, unspecified whether sciatica present     Problem List Patient Active Problem List   Diagnosis Date Noted  . Acute right-sided low back pain with right-sided sciatica 06/13/2018  . Hypertension   . High cholesterol   . Anxiety    12:18 PM, 02/12/21 Josue Hector PT DPT  Physical Therapist with [facility]  [phone]   [facility] [address] [place], [place], [address] Phone: [phone]   Fax:  [phone]  Name: Linda [name] MRN: [id number] Date of Birth: Jan 23, 1941

## 2021-02-12 NOTE — Patient Instructions (Signed)
Access Code: [email or web address] URL: [email or web address] Date: 02/12/2021 Prepared by: Josue Hector  Exercises Standing Single Leg Stance with Counter Support - 2 x daily - 7 x weekly - 1 sets - 3 reps - 10 second hold Standing Hip Extension with Counter Support - 2 x daily - 7 x weekly - 2 sets - 10 reps - 1-2 second hold

## 2021-02-18 ENCOUNTER — Ambulatory Visit (HOSPITAL_COMMUNITY): Payer: Medicare HMO | Attending: Physician Assistant | Admitting: Physical Therapy

## 2021-02-18 ENCOUNTER — Encounter (HOSPITAL_COMMUNITY): Payer: Self-pay | Admitting: Physical Therapy

## 2021-02-18 ENCOUNTER — Other Ambulatory Visit: Payer: Self-pay

## 2021-02-18 DIAGNOSIS — M545 Low back pain, unspecified: Secondary | ICD-10-CM | POA: Diagnosis not present

## 2021-02-18 DIAGNOSIS — M25562 Pain in left knee: Secondary | ICD-10-CM | POA: Insufficient documentation

## 2021-02-18 DIAGNOSIS — M25561 Pain in right knee: Secondary | ICD-10-CM | POA: Diagnosis not present

## 2021-02-18 NOTE — Therapy (Signed)
[place], [place], [address] Phone: [phone]   Fax:  [phone]  Physical Therapy Treatment  Patient Details  Name: Linda [name] MRN: [id number] Date of Birth: 1941-07-04 Referring Provider (PT): Erskine Emery PA-C   Encounter Date: 02/18/2021   PT End of Session - 02/18/21 1436    Visit Number 7    Number of Visits 8    Date for PT Re-Evaluation 02/25/21    Authorization Type [employer]    Authorization Time Period 8 approved 4/12-5/10    Authorization - Visit Number 7    Authorization - Number of Visits 8    Progress Note Due on Visit 8    PT Start Time 0962    PT Stop Time 1513    PT Time Calculation (min) 41 min    Activity Tolerance Patient tolerated treatment well    Behavior During Therapy [facility] for tasks assessed/performed           Past Medical History:  Diagnosis Date  . Anxiety   . Candida esophagitis ([place])   . Chronic kidney disease   . DDD (degenerative disc disease)   . Depression   . Erosive gastritis   . Falls   . GERD (gastroesophageal reflux disease)   . Hiatal hernia   . High cholesterol   . History of anal fissures   . Hypertension   . Obesity   . Osteoarthritis   . Panic disorder   . Sleep apnea    Does not need CPAP anymore    Past Surgical History:  Procedure Laterality Date  . ABDOMINAL HYSTERECTOMY  1987   LSO / menorrhagia & leiomyomata  . APPENDECTOMY    . BREAST EXCISIONAL BIOPSY Left 1989   benign  . BREAST SURGERY  1989   benign tumor left breast  . CHOLECYSTECTOMY  2001  . TUBAL LIGATION      There were no vitals filed for this visit.   Subjective Assessment - 02/18/21 1435    Subjective Patient doing well no new complaints. No pain right now.    Limitations Lifting;Standing;Walking;House hold activities    How long can you walk comfortably? 30 minutes    Patient Stated Goals Get my aches and pain in better shape    Currently in Pain?  No/denies    Pain Onset More than a month ago                             [facility] Adult PT Treatment/Exercise - 02/18/21 0001      Lumbar Exercises: Standing   Heel Raises 20 reps    Other Standing Lumbar Exercises palloff press GTB x 20 each    Other Standing Lumbar Exercises band extension GTB 2 x10      Knee/Hip Exercises: Standing   Hip Abduction Both;2 sets;10 reps    Abduction Limitations RTB    Hip Extension Both;2 sets;10 reps    Extension Limitations RTB    Lateral Step Up Both;2 sets;10 reps;Hand Hold: 2;Step Height: 6"    Forward Step Up Both;2 sets;10 reps;Hand Hold: 1;Step Height: 6"    Other Standing Knee Exercises sidestepping in // bars 5RT RTB, alt step taps on 6 inch box x20 each    Other Standing Knee Exercises tandem stance 3 x 30" int HHA  PT Short Term Goals - 01/28/21 1117      PT SHORT TERM GOAL #1   Title Patient will be independent with initial HEP and self-management strategies to improve functional outcomes    Time 2    Period Weeks    Status New    Target Date 02/11/21             PT Long Term Goals - 01/28/21 1155      PT LONG TERM GOAL #1   Title Patient will report at least 75% overall improvement in subjective complaint to indicate improvement in ability to perform ADLs.    Time 4    Period Weeks    Status New    Target Date 02/25/21      PT LONG TERM GOAL #2   Title Patient will improve FOTO score to by 5% to indicate improvement in functional outcomes    Time 4    Period Weeks    Status New    Target Date 02/25/21      PT LONG TERM GOAL #3   Title Patient will be able to stand/ walk > 30 minutes with knee pain not to exceed 3/10 to improve ability to walk and exercise outdoors    Time 4    Period Weeks    Status New    Target Date 02/25/21      PT LONG TERM GOAL #4   Title Patient will have no knee pain at rest for improved baseline function and quality of life.    Time 4     Period Weeks    Status New    Target Date 02/25/21                 Plan - 02/18/21 1554    Clinical Impression Statement Patient progressing well toward therapy goals. Tolerated band resisted hip abduction and extension for LE strength progressions well. Patient continues to be challenged with static balance. Patient reporting notable improvement with back pain since beginning therapy. Reassess next visit, adjust POC as indicated.    Personal Factors and Comorbidities Age;Time since onset of injury/illness/exacerbation    Examination-Activity Limitations Lift;Stand;Locomotion Level;Transfers;Squat;Stairs    Examination-Participation Restrictions Cleaning;Yard Work;Community Activity;Laundry    Stability/Clinical Decision Making Stable/Uncomplicated    Rehab Potential Good    PT Frequency 2x / week    PT Duration 4 weeks    PT Treatment/Interventions ADLs/Self Care Home Management;Aquatic Therapy;Biofeedback;Cryotherapy;Electrical Stimulation;Contrast Bath;Fluidtherapy;Therapeutic activities;Therapeutic exercise;Patient/family education;Orthotic Fit/Training;Manual techniques;Manual lymph drainage;Compression bandaging;Splinting;Taping;Vasopneumatic Device;Joint Manipulations;Spinal Manipulations;Energy conservation;Dry needling;Scar mobilization;Iontophoresis 4mg /ml Dexamethasone;Moist Heat;Traction;Ultrasound;Parrafin;DME Instruction;Gait training;Stair training;Functional mobility training;Balance training;Neuromuscular re-education;Passive range of motion;Vestibular;Visual/perceptual remediation/compensation    PT Next Visit Plan Reassess next visit    PT Home Exercise Plan Eval: quad set, heel slide, HS stretch 4/14: bridge, ab set, SLR 4/20 heel raise, standing hip abduction, tandem stance 4/27 standing hip extension, single limb stance with support 5/3 band hip abduction/ extension    Consulted and Agree with Plan of Care Patient           Patient will benefit from skilled  therapeutic intervention in order to improve the following deficits and impairments:  Pain,Improper body mechanics,Increased fascial restricitons,Abnormal gait,Difficulty walking,Impaired flexibility,Decreased strength,Decreased range of motion,Decreased activity tolerance,Decreased mobility,Hypomobility  Visit Diagnosis: Right knee pain, unspecified chronicity  Left knee pain, unspecified chronicity  Low back pain, unspecified back pain laterality, unspecified chronicity, unspecified whether sciatica present     Problem List Patient Active Problem List   Diagnosis Date Noted  . Acute  right-sided low back pain with right-sided sciatica 06/13/2018  . Hypertension   . High cholesterol   . Anxiety     4:14 PM, 02/18/21 Josue Hector PT DPT  Physical Therapist with [facility]  [phone]   [facility] [address] [place], [place], [address] Phone: [phone]   Fax:  [phone]  Name: Linda [name] MRN: [id number] Date of Birth: 08-Aug-1941

## 2021-02-20 ENCOUNTER — Encounter (HOSPITAL_COMMUNITY): Payer: Self-pay | Admitting: Physical Therapy

## 2021-02-20 ENCOUNTER — Other Ambulatory Visit: Payer: Self-pay

## 2021-02-20 ENCOUNTER — Ambulatory Visit (HOSPITAL_COMMUNITY): Payer: Medicare HMO | Admitting: Physical Therapy

## 2021-02-20 DIAGNOSIS — M25561 Pain in right knee: Secondary | ICD-10-CM | POA: Diagnosis not present

## 2021-02-20 DIAGNOSIS — M545 Low back pain, unspecified: Secondary | ICD-10-CM

## 2021-02-20 DIAGNOSIS — M25562 Pain in left knee: Secondary | ICD-10-CM | POA: Diagnosis not present

## 2021-02-20 NOTE — Patient Instructions (Signed)
Access Code: [email or web address] URL: [email or web address] Date: 02/20/2021 Prepared by: Josue Hector  Exercises Supine Hamstring Stretch with Strap - 1 x daily - 4 x weekly - 1 sets - 3 reps - 30 seconds hold Active Straight Leg Raise with Quad Set - 1 x daily - 4 x weekly - 2 sets - 10 reps Supine Bridge - 1 x daily - 4 x weekly - 2 sets - 10 reps Heel rises with counter support - 1 x daily - 4 x weekly - 2 sets - 10 reps Standing Hip Abduction with Resistance at Ankles and Counter Support - 1 x daily - 4 x weekly - 2 sets - 10 reps Standing Hip Extension with Resistance at Ankles and Counter Support - 1 x daily - 4 x weekly - 2 sets - 10 reps Standing Tandem Balance with Counter Support - 1 x daily - 4 x weekly - 1 sets - 3 reps - 30 seconds hold Standing Single Leg Stance with Counter Support - 1 x daily - 4 x weekly - 1 sets - 3 reps - 15 seconds hold

## 2021-02-20 NOTE — Therapy (Signed)
[facility] [address] [place], [place], [address] Phone: [phone]   Fax:  [phone]  Physical Therapy Treatment  Patient Details  Name: Linda [name] MRN: [id number] Date of Birth: 08-Feb-1941 Referring Provider (PT): Erskine Emery PA-C  PHYSICAL THERAPY DISCHARGE SUMMARY  Visits from Start of Care: 8  Current functional level related to goals / functional outcomes: See below    Remaining deficits: See below   Education / Equipment: See assessment Plan: Patient agrees to discharge.  Patient goals were met. Patient is being discharged due to meeting the stated rehab goals.  ?????      Encounter Date: 02/20/2021   PT End of Session - 02/20/21 1655    Visit Number 8    Number of Visits 8    Date for PT Re-Evaluation 02/25/21    Authorization Type [employer]    Authorization Time Period 8 approved 4/12-5/10    Authorization - Visit Number 8    Authorization - Number of Visits 8    Progress Note Due on Visit 8    PT Start Time 4580    PT Stop Time 1728    PT Time Calculation (min) 38 min    Activity Tolerance Patient tolerated treatment well    Behavior During Therapy WFL for tasks assessed/performed           Past Medical History:  Diagnosis Date  . Anxiety   . Candida esophagitis ([employer] [facility] [employer])   . Chronic kidney disease   . DDD (degenerative disc disease)   . Depression   . Erosive gastritis   . Falls   . GERD (gastroesophageal reflux disease)   . Hiatal hernia   . High cholesterol   . History of anal fissures   . Hypertension   . Obesity   . Osteoarthritis   . Panic disorder   . Sleep apnea    Does not need CPAP anymore    Past Surgical History:  Procedure Laterality Date  . ABDOMINAL HYSTERECTOMY  1987   LSO / menorrhagia & leiomyomata  . APPENDECTOMY    . BREAST EXCISIONAL BIOPSY Left 1989   benign  . BREAST SURGERY  1989   benign tumor left breast  . CHOLECYSTECTOMY  2001  . TUBAL  LIGATION      There were no vitals filed for this visit.   Subjective Assessment - 02/20/21 1655    Subjective Patient says things are great. She reports no new issues. Denies pain currently. Patient reports 95% improvement since starting therapy.    Limitations Lifting;Standing;Walking;House hold activities    How long can you walk comfortably? 30 minutes    Patient Stated Goals Get my aches and pain in better shape    Currently in Pain? No/denies    Pain Onset More than a month ago              [facility] PT Assessment - 02/20/21 0001      Assessment   Medical Diagnosis Chronic right-sided low back pain without sciatica (PT diagnosis bilat knee pain)    Referring Provider (PT) Erskine Emery PA-C    Prior Therapy Yes      Restrictions   Weight Bearing Restrictions No      Balance Screen   Has the patient fallen in the past 6 months No      [place] residence    Living Arrangements Spouse/significant other      Prior Function  Level of Independence Independent      Cognition   Overall Cognitive Status Within Functional Limits for tasks assessed      Observation/Other Assessments   Focus on Therapeutic Outcomes (FOTO)  86% function   was 79%     AROM   Right Knee Extension 6   was 8   Right Knee Flexion 123   was 120   Left Knee Extension 3   was 5   Left Knee Flexion 125   was 120     Strength   Right Hip Flexion 4+/5    Right Hip Extension 3-/5   (due to hip flexibility restriciton)   Right Hip ABduction 4+/5    Left Hip Flexion 4+/5    Left Hip Extension 4/5    Left Hip ABduction 4+/5    Right Knee Extension 5/5    Left Knee Extension 4+/5      Flexibility   Hamstrings min HS restriciton bilaterally      Balance   Balance Assessed Yes      Static Standing Balance   Static Standing Balance -  Activities  Tandam Stance - Right Leg;Tandam Stance - Left Leg    Static Standing - Comment/# of Minutes 30 seconds  bilaterally in semi tandem                         [facility] Adult PT Treatment/Exercise - 02/20/21 0001      Knee/Hip Exercises: Standing   Heel Raises Both;10 reps    Hip Abduction Both;1 set;10 reps    Hip Extension Both;1 set;10 reps    Stairs 7 in recipriocal single rail 2RT    Other Standing Knee Exercises semi tandem stance 2 x 30" int HHA                    PT Short Term Goals - 02/20/21 1749      PT SHORT TERM GOAL #1   Title Patient will be independent with initial HEP and self-management strategies to improve functional outcomes    Time 2    Period Weeks    Status Achieved    Target Date 02/11/21             PT Long Term Goals - 02/20/21 1750      PT LONG TERM GOAL #1   Title Patient will report at least 75% overall improvement in subjective complaint to indicate improvement in ability to perform ADLs.    Baseline Reports 95% improvement    Time 4    Period Weeks    Status Achieved      PT LONG TERM GOAL #2   Title Patient will improve FOTO score to by 5% to indicate improvement in functional outcomes    Baseline Improved 7%    Time 4    Period Weeks    Status Achieved      PT LONG TERM GOAL #3   Title Patient will be able to stand/ walk > 30 minutes with knee pain not to exceed 3/10 to improve ability to walk and exercise outdoors    Baseline Going for 30+ min walks almost every day    Time 4    Period Weeks    Status Achieved      PT LONG TERM GOAL #4   Title Patient will have no knee pain at rest for improved baseline function and quality of life.    Baseline 0/10 pain  currently    Time 4    Period Weeks    Status Achieved                 Plan - 02/20/21 1755    Clinical Impression Statement Patient has made very good progress in therapy and has currently met all therapy goal. Patient currently reports no pain at rest or with ADLs, and no functional limitations at presents. Patient has returned to walking 1 mile  daily when weather permits with no complaints. Reviewed HEP exercises and issued updated handout. Answered all patient questions. Patient being DC today with all goals met. Patient encouraged to follow up with therapy services with any further questions or concerns.    Personal Factors and Comorbidities Age;Time since onset of injury/illness/exacerbation    Examination-Activity Limitations Lift;Stand;Locomotion Level;Transfers;Squat;Stairs    Examination-Participation Restrictions Cleaning;Yard Work;Community Activity;Laundry    Stability/Clinical Decision Making Stable/Uncomplicated    Rehab Potential Good    PT Treatment/Interventions ADLs/Self Care Home Management;Aquatic Therapy;Biofeedback;Cryotherapy;Electrical Stimulation;Contrast Bath;Fluidtherapy;Therapeutic activities;Therapeutic exercise;Patient/family education;Orthotic Fit/Training;Manual techniques;Manual lymph drainage;Compression bandaging;Splinting;Taping;Vasopneumatic Device;Joint Manipulations;Spinal Manipulations;Energy conservation;Dry needling;Scar mobilization;Iontophoresis 50m/ml Dexamethasone;Moist Heat;Traction;Ultrasound;Parrafin;DME Instruction;Gait training;Stair training;Functional mobility training;Balance training;Neuromuscular re-education;Passive range of motion;Vestibular;Visual/perceptual remediation/compensation    PT Next Visit Plan DC to HEP    PT Home Exercise Plan Eval: quad set, heel slide, HS stretch 4/14: bridge, ab set, SLR 4/20 heel raise, standing hip abduction, tandem stance 4/27 standing hip extension, single limb stance with support 5/3 band hip abduction/ extension    Consulted and Agree with Plan of Care Patient           Patient will benefit from skilled therapeutic intervention in order to improve the following deficits and impairments:  Pain,Improper body mechanics,Increased fascial restricitons,Abnormal gait,Difficulty walking,Impaired flexibility,Decreased strength,Decreased range of  motion,Decreased activity tolerance,Decreased mobility,Hypomobility  Visit Diagnosis: Right knee pain, unspecified chronicity  Left knee pain, unspecified chronicity  Low back pain, unspecified back pain laterality, unspecified chronicity, unspecified whether sciatica present     Problem List Patient Active Problem List   Diagnosis Date Noted  . Acute right-sided low back pain with right-sided sciatica 06/13/2018  . Hypertension   . High cholesterol   . Anxiety     6:01 PM, 02/20/21 CJosue HectorPT DPT  Physical Therapist with [facility] ([phone]  [facility] [address]Phone: [phone]  Fax:  [phone] Name: FDHANVI BOESENMRN: [id number] of Birth: 3Dec 14, 1942

## 2021-03-07 ENCOUNTER — Telehealth: Payer: Self-pay | Admitting: Gastroenterology

## 2021-03-07 NOTE — Telephone Encounter (Signed)
Pt has been dealing with nausea and severe gas for the past weeks, she states that it radiates to right side of her stomach and back. She has also been experiencing indigestion. She would like some advise. Pls call her.

## 2021-03-07 NOTE — Telephone Encounter (Signed)
Left message for patient to call back  

## 2021-03-07 NOTE — Telephone Encounter (Signed)
Patient reports she has occasional chest pain and was inquiring if GERD can cause pain.  She has excess gas and indigestion.  All episodes are short.  She has been evaluated for her heart recently and told that her pain is non cardiac.  She will remain on her dexilant and use her Pepcid prn.  She will call back for additional questions or concerns.

## 2021-03-18 ENCOUNTER — Other Ambulatory Visit: Payer: Self-pay | Admitting: Internal Medicine

## 2021-03-18 DIAGNOSIS — I10 Essential (primary) hypertension: Secondary | ICD-10-CM

## 2021-03-24 ENCOUNTER — Telehealth: Payer: Self-pay | Admitting: Internal Medicine

## 2021-03-24 DIAGNOSIS — I10 Essential (primary) hypertension: Secondary | ICD-10-CM

## 2021-03-24 MED ORDER — SPIRONOLACTONE 25 MG PO TABS: 25.0000 mg | ORAL_TABLET | Freq: Every day | ORAL | 2 refills | Status: DC

## 2021-03-24 NOTE — Telephone Encounter (Signed)
*  STAT* If patient is at the pharmacy, call can be transferred to refill team.   1. Which medications need to be refilled? (please list name of each medication and dose if known) spironolactone (ALDACTONE) 25 MG tablet   2. Which pharmacy/location (including street and city if local pharmacy) is medication to be sent to? [facility]  3. Do they need a 30 day or 90 day supply? 90ds

## 2021-03-25 DIAGNOSIS — J309 Allergic rhinitis, unspecified: Secondary | ICD-10-CM | POA: Diagnosis not present

## 2021-03-25 DIAGNOSIS — F339 Major depressive disorder, recurrent, unspecified: Secondary | ICD-10-CM | POA: Diagnosis not present

## 2021-03-25 DIAGNOSIS — R251 Tremor, unspecified: Secondary | ICD-10-CM | POA: Diagnosis not present

## 2021-03-25 DIAGNOSIS — I10 Essential (primary) hypertension: Secondary | ICD-10-CM | POA: Diagnosis not present

## 2021-03-25 DIAGNOSIS — G4733 Obstructive sleep apnea (adult) (pediatric): Secondary | ICD-10-CM | POA: Diagnosis not present

## 2021-03-25 DIAGNOSIS — E785 Hyperlipidemia, unspecified: Secondary | ICD-10-CM | POA: Diagnosis not present

## 2021-03-25 DIAGNOSIS — J069 Acute upper respiratory infection, unspecified: Secondary | ICD-10-CM | POA: Diagnosis not present

## 2021-03-25 DIAGNOSIS — R001 Bradycardia, unspecified: Secondary | ICD-10-CM | POA: Diagnosis not present

## 2021-05-01 ENCOUNTER — Telehealth: Payer: Self-pay | Admitting: Gastroenterology

## 2021-05-01 DIAGNOSIS — Z85828 Personal history of other malignant neoplasm of skin: Secondary | ICD-10-CM | POA: Diagnosis not present

## 2021-05-01 DIAGNOSIS — L304 Erythema intertrigo: Secondary | ICD-10-CM | POA: Diagnosis not present

## 2021-05-01 DIAGNOSIS — Z08 Encounter for follow-up examination after completed treatment for malignant neoplasm: Secondary | ICD-10-CM | POA: Diagnosis not present

## 2021-05-01 DIAGNOSIS — Z1283 Encounter for screening for malignant neoplasm of skin: Secondary | ICD-10-CM | POA: Diagnosis not present

## 2021-05-01 NOTE — Telephone Encounter (Signed)
Inbound call from pt requesting a call back stating that she is experiencing Acid reflux, nausea, loss of appetite. Please advise. Thanks.

## 2021-05-01 NOTE — Telephone Encounter (Signed)
Patient reports that she has reflux and "acid" in the am.  She confirms Dexilant 60 mg daily and famotidine at HS.  She will increase her famotidine to BID for 1 week then try to resume at HS.  She is advised she can try Gaviscon or similar for break through during the night.  She will call back if this fails to control her symptoms for an appointment.

## 2021-05-14 ENCOUNTER — Ambulatory Visit: Payer: Medicare HMO | Admitting: Orthopaedic Surgery

## 2021-05-15 ENCOUNTER — Other Ambulatory Visit: Payer: Self-pay | Admitting: Internal Medicine

## 2021-05-15 DIAGNOSIS — Z1231 Encounter for screening mammogram for malignant neoplasm of breast: Secondary | ICD-10-CM

## 2021-05-20 ENCOUNTER — Ambulatory Visit: Payer: Medicare HMO | Admitting: Physician Assistant

## 2021-05-20 ENCOUNTER — Encounter: Payer: Self-pay | Admitting: Physician Assistant

## 2021-05-20 DIAGNOSIS — M1712 Unilateral primary osteoarthritis, left knee: Secondary | ICD-10-CM | POA: Diagnosis not present

## 2021-05-20 DIAGNOSIS — M1711 Unilateral primary osteoarthritis, right knee: Secondary | ICD-10-CM | POA: Diagnosis not present

## 2021-05-20 DIAGNOSIS — M170 Bilateral primary osteoarthritis of knee: Secondary | ICD-10-CM

## 2021-05-20 MED ORDER — LIDOCAINE HCL 1 % IJ SOLN
3.0000 mL | INTRAMUSCULAR | Status: AC | PRN
  Administered 2021-05-20: 3 mL

## 2021-05-20 MED ORDER — METHYLPREDNISOLONE ACETATE 40 MG/ML IJ SUSP
40.0000 mg | INTRAMUSCULAR | Status: AC | PRN
  Administered 2021-05-20: 40 mg via INTRA_ARTICULAR

## 2021-05-20 NOTE — Progress Notes (Signed)
   Procedure Note  Patient: Linda [name]             Date of Birth: November 23, 1940           MRN: [id number]             Visit Date: 05/20/2021   HPI: [name] returns today requesting cortisone injections both knees.  Last injections were given on 01/27/2021.  She states both knees did well until the last couple of weeks.  She has had no new injury to either knee.  She does have known osteoarthritis both knees.  Review of systems: Denies any fevers, chills, recent vaccines please see HPI otherwise negative Physical exam: General well-developed well-nourished female no acute distress mood and affect appropriate Bilateral knees: Good range of motion of both knees.  No abnormal warmth erythema or effusion of either knee.  No gross instability of either knee.  Procedures: Visit Diagnoses:  1. Primary osteoarthritis of right knee   2. Primary osteoarthritis of left knee     Large Joint Inj: bilateral knee on 05/20/2021 10:56 AM Indications: pain Details: 22 G 1.5 in needle, anterolateral approach  Arthrogram: No  Medications (Right): 3 mL lidocaine 1 %; 40 mg methylPREDNISolone acetate 40 MG/ML Medications (Left): 3 mL lidocaine 1 %; 40 mg methylPREDNISolone acetate 40 MG/ML Outcome: tolerated well, no immediate complications Procedure, treatment alternatives, risks and benefits explained, specific risks discussed. Consent was given by the patient. Immediately prior to procedure a time out was called to verify the correct patient, procedure, equipment, support staff and site/side marked as required. Patient was prepped and draped in the usual sterile fashion.     Plan: She understands to wait at least 3 months between injections.  Questions encouraged and answered at length.  Follow-up as needed.

## 2021-07-14 ENCOUNTER — Ambulatory Visit
Admission: RE | Admit: 2021-07-14 | Discharge: 2021-07-14 | Disposition: A | Discharge status: Home / Self Care | Payer: Medicare HMO | Source: Ambulatory Visit | Attending: Internal Medicine | Admitting: Internal Medicine

## 2021-07-14 ENCOUNTER — Other Ambulatory Visit: Payer: Self-pay

## 2021-07-14 DIAGNOSIS — Z1231 Encounter for screening mammogram for malignant neoplasm of breast: Secondary | ICD-10-CM | POA: Diagnosis not present

## 2021-07-15 ENCOUNTER — Encounter: Payer: Self-pay | Admitting: Gastroenterology

## 2021-07-15 ENCOUNTER — Ambulatory Visit: Payer: Medicare HMO | Admitting: Gastroenterology

## 2021-07-15 VITALS — BP 112/58 | HR 66 | Ht 61.0 in | Wt 165.0 lb

## 2021-07-15 DIAGNOSIS — R110 Nausea: Secondary | ICD-10-CM | POA: Diagnosis not present

## 2021-07-15 DIAGNOSIS — K219 Gastro-esophageal reflux disease without esophagitis: Secondary | ICD-10-CM | POA: Diagnosis not present

## 2021-07-15 MED ORDER — FAMOTIDINE 20 MG PO TABS: ORAL_TABLET | ORAL | 5 refills | Status: DC

## 2021-07-15 MED ORDER — DEXLANSOPRAZOLE 60 MG PO CPDR: DELAYED_RELEASE_CAPSULE | ORAL | 11 refills | Status: DC

## 2021-07-15 NOTE — Progress Notes (Signed)
    History of Present Illness: This is a 80 year old female with a history of GERD and LPR.  Her reflux symptoms have been under very good control on Dexilant and famotidine.  She relates a burning sensation on her tongue and in her mouth which is not associated with regurgitation or heartburn.  She has had long-term difficulties with nausea without a clear etiology.  Zofran has been effective.  Over the past week her nausea has not been active.  Current Medications, Allergies, Past Medical History, Past Surgical History, Family History and Social History were reviewed in [employer] record.   Physical Exam: General: Well developed, well nourished, no acute distress Head: Normocephalic and atraumatic Eyes: Sclerae anicteric, EOMI Ears: Normal auditory acuity Mouth: No exudates or lesions noted Lungs: Clear throughout to auscultation Heart: Regular rate and rhythm; no murmurs, rubs or bruits Abdomen: Soft, non tender and non distended. No masses, hepatosplenomegaly or hernias noted. Normal Bowel sounds Rectal: Musculoskeletal: Symmetrical with no gross deformities  Pulses:  Normal pulses noted Extremities: No clubbing, cyanosis, edema or deformities noted Neurological: Alert oriented x 4, grossly nonfocal Psychological:  Alert and cooperative. Normal mood and affect   Assessment and Recommendations:  GERD, LPR under good control.  Follow antireflux measures.  Continue Dexilant 60 mg p.o. every morning and famotidine 20 mg at bedtime. REV in 1 year. Nausea, etiology unclear.  Continue Zofran 8 mg p.o. 3 times daily as needed. Burning sensation on tongue and in mouth does not appear to be related to GERD, reflux or regurgitation.  Follow-up with PCP.  Consider Dental or ENT referral, defer to PCP.

## 2021-07-15 NOTE — Patient Instructions (Signed)
We have sent the following medications to your pharmacy for you to pick up at your convenience: Dexilant and famotidine.   Due to recent changes in healthcare laws, you may see the results of your imaging and laboratory studies on MyChart before your provider has had a chance to review them.  We understand that in some cases there may be results that are confusing or concerning to you. Not all laboratory results come back in the same time frame and the provider may be waiting for multiple results in order to interpret others.  Please give [place] 48 hours in order for your provider to thoroughly review all the results before contacting the office for clarification of your results.   The [facility] providers would like to encourage you to use [facility] to communicate with providers for non-urgent requests or questions.  Due to long hold times on the telephone, sending your provider a message by [facility] may be a faster and more efficient way to get a response.  Please allow 48 business hours for a response.  Please remember that this is for non-urgent requests.   Thank you for choosing me and [facility].  Pricilla Riffle. Dagoberto Ligas., MD., Marval Regal

## 2021-07-24 DIAGNOSIS — F430 Acute stress reaction: Secondary | ICD-10-CM | POA: Diagnosis not present

## 2021-07-24 DIAGNOSIS — R531 Weakness: Secondary | ICD-10-CM | POA: Diagnosis not present

## 2021-07-24 DIAGNOSIS — F339 Major depressive disorder, recurrent, unspecified: Secondary | ICD-10-CM | POA: Diagnosis not present

## 2021-07-24 DIAGNOSIS — R42 Dizziness and giddiness: Secondary | ICD-10-CM | POA: Diagnosis not present

## 2021-07-24 DIAGNOSIS — R413 Other amnesia: Secondary | ICD-10-CM | POA: Diagnosis not present

## 2021-07-26 DIAGNOSIS — Z23 Encounter for immunization: Secondary | ICD-10-CM | POA: Diagnosis not present

## 2021-08-04 ENCOUNTER — Other Ambulatory Visit: Payer: Self-pay

## 2021-08-04 ENCOUNTER — Encounter: Payer: Self-pay | Admitting: Physician Assistant

## 2021-08-04 ENCOUNTER — Ambulatory Visit: Payer: Self-pay

## 2021-08-04 ENCOUNTER — Ambulatory Visit: Payer: Medicare HMO | Admitting: Physician Assistant

## 2021-08-04 ENCOUNTER — Telehealth: Payer: Self-pay

## 2021-08-04 DIAGNOSIS — M1712 Unilateral primary osteoarthritis, left knee: Secondary | ICD-10-CM | POA: Diagnosis not present

## 2021-08-04 DIAGNOSIS — M170 Bilateral primary osteoarthritis of knee: Secondary | ICD-10-CM | POA: Diagnosis not present

## 2021-08-04 DIAGNOSIS — M1711 Unilateral primary osteoarthritis, right knee: Secondary | ICD-10-CM | POA: Diagnosis not present

## 2021-08-04 DIAGNOSIS — M545 Low back pain, unspecified: Secondary | ICD-10-CM

## 2021-08-04 MED ORDER — METHYLPREDNISOLONE 4 MG PO TABS: ORAL_TABLET | ORAL | 0 refills | Status: DC

## 2021-08-04 NOTE — Telephone Encounter (Signed)
Please get auth for bilateral knee gel injection-gil pt 

## 2021-08-04 NOTE — Telephone Encounter (Signed)
Noted  

## 2021-08-04 NOTE — Progress Notes (Signed)
Office Visit Note   Patient: Linda [name]           Date of Birth: 1941/06/18           MRN: [id number] Visit Date: 08/04/2021              Requested by: Crist Infante, MD [address] [place],  [place] [address] PCP: Crist Infante, MD   Assessment & Plan: Visit Diagnoses:  1. Primary osteoarthritis of right knee   2. Primary osteoarthritis of left knee   3. Midline low back pain without sciatica, unspecified chronicity     Plan: We will try to gain supplemental injections for both knees and have her back once this is available.  Regards to her back she has home exercise program.  I did offer her going back to formal therapy she defers.  We will place her on a Medrol Dosepak.  She can continue extra strength Tylenol and Biofreeze as needed as beneficial in regards to her back pain.  Follow-Up Instructions: Return for Supplemental injection.   Orders:  Orders Placed This Encounter  Procedures   XR Knee 1-2 Views Right   XR Knee 1-2 Views Left   Meds ordered this encounter  Medications   methylPREDNISolone (MEDROL) 4 MG tablet    Sig: Take as directed    Dispense:  21 tablet    Refill:  0      Procedures: No procedures performed   Clinical Data: No additional findings.   Subjective: Chief Complaint  Patient presents with   Lower Back - Pain, Follow-up   Right Knee - Pain, Follow-up    HPI [name] comes in today with bilateral knee pain.  Also low back pain.  She states that knee injections given to her on 8-22 with cortisone did not last long.  She has had no new injury to either knee.  It has been sometime since we x-rayed her knees.  In regards to her low back pain that started a week ago she states physical therapy has helped in the past she does have home exercises that she can do but has not been doing.  She ranks her back pain to be 6 out of 10 pain.  No radicular symptoms.  Denies any saddle anesthesia bowel or bladder dysfunction waking pain or  radicular pain.  She has been using Biofreeze and extra strength Tylenol which she finds beneficial for her back pain.  She is nondiabetic.  No ongoing infections. Review of Systems See HPI otherwise negative  Objective: Vital Signs: There were no vitals taken for this visit.  Physical Exam General: Well-developed well-nourished female no acute distress mood affect appropriate.  Ambulates without any assistive device. Psych alert and oriented x3.   Ortho Exam Bilateral knees good range of motion both knees she has global tenderness no effusion abnormal warmth erythema.  Patellofemoral crepitus bilaterally no instability valgus varus stressing.  Lower extremities 5 out of 5 strength throughout against resistance negative straight leg raise bilaterally.  Tight hamstrings bilaterally.  She has limited flexion extension lumbar spine.  Tenderness over the lower lumbar spinal column with palpation. Specialty Comments:  No specialty comments available.  Imaging: XR Knee 1-2 Views Left  Result Date: 08/04/2021 Left knee 2 views: No acute fracture.  Knee is well located.  Mild patellofemoral arthritic changes moderate medial compartmental narrowing.  Lateral compartment with periarticular spurring.  XR Knee 1-2 Views Right  Result Date: 08/04/2021 Right knee 2 views: Shows mild  to moderate patellofemoral arthritis.  Moderate medial lateral joint line narrowing.  Knee is well located no acute fractures or bony abnormalities.    PMFS History: Patient Active Problem List   Diagnosis Date Noted   Acute right-sided low back pain with right-sided sciatica 06/13/2018   Hypertension    High cholesterol    Anxiety    Past Medical History:  Diagnosis Date   Anxiety    Candida esophagitis ([facility])    Chronic kidney disease    DDD (degenerative disc disease)    Depression    Erosive gastritis    Falls    GERD (gastroesophageal reflux disease)    Hiatal hernia    High cholesterol    History of  anal fissures    Hypertension    Obesity    Osteoarthritis    Panic disorder    Sleep apnea    Does not need CPAP anymore    Family History  Problem Relation Age of Onset   Heart disease Mother    Lung cancer Father    Diabetes Sister    Hypertension Sister    Diabetes Brother    Heart disease Brother    Heart attack Brother    Ovarian cancer Sister    Heart disease Brother    Heart attack Brother    Heart disease Brother    Heart attack Brother    Colon cancer Neg Hx     Past Surgical History:  Procedure Laterality Date   ABDOMINAL HYSTERECTOMY  1987   LSO / menorrhagia & leiomyomata   APPENDECTOMY     BREAST EXCISIONAL BIOPSY Left 1989   benign   BREAST EXCISIONAL BIOPSY Left 07/11/2020   benign   BREAST SURGERY  1989   benign tumor left breast   CHOLECYSTECTOMY  2001   TUBAL LIGATION     Social History   Occupational History   Occupation: Retired   Tobacco Use   Smoking status: Never   Smokeless tobacco: Never  Vaping Use   Vaping Use: Never used  Substance and Sexual Activity   Alcohol use: No   Drug use: No   Sexual activity: Yes

## 2021-08-07 DIAGNOSIS — H25813 Combined forms of age-related cataract, bilateral: Secondary | ICD-10-CM | POA: Diagnosis not present

## 2021-08-14 ENCOUNTER — Telehealth: Payer: Self-pay | Admitting: Gastroenterology

## 2021-08-14 NOTE — Telephone Encounter (Signed)
Patient called states she is having issues with nausea and having to go to the bathroom seeking advise.

## 2021-08-14 NOTE — Telephone Encounter (Signed)
Patient encouraged to continue her FD gard and Dexilant.  Take her omeprazole as needed.  She has not had any more diarrhea today. She will call back for additional questions or concerns.

## 2021-09-01 ENCOUNTER — Telehealth: Payer: Self-pay

## 2021-09-01 ENCOUNTER — Encounter: Payer: Self-pay | Admitting: Physician Assistant

## 2021-09-01 ENCOUNTER — Ambulatory Visit: Payer: Medicare HMO | Admitting: Physician Assistant

## 2021-09-01 DIAGNOSIS — M25562 Pain in left knee: Secondary | ICD-10-CM | POA: Diagnosis not present

## 2021-09-01 DIAGNOSIS — M1712 Unilateral primary osteoarthritis, left knee: Secondary | ICD-10-CM | POA: Diagnosis not present

## 2021-09-01 DIAGNOSIS — M170 Bilateral primary osteoarthritis of knee: Secondary | ICD-10-CM

## 2021-09-01 DIAGNOSIS — M1711 Unilateral primary osteoarthritis, right knee: Secondary | ICD-10-CM

## 2021-09-01 DIAGNOSIS — M25561 Pain in right knee: Secondary | ICD-10-CM | POA: Diagnosis not present

## 2021-09-01 MED ORDER — LIDOCAINE HCL 1 % IJ SOLN
3.0000 mL | INTRAMUSCULAR | Status: AC | PRN
  Administered 2021-09-01: 3 mL

## 2021-09-01 MED ORDER — METHYLPREDNISOLONE ACETATE 40 MG/ML IJ SUSP
40.0000 mg | INTRAMUSCULAR | Status: AC | PRN
  Administered 2021-09-01: 40 mg via INTRA_ARTICULAR

## 2021-09-01 NOTE — Progress Notes (Signed)
   Procedure Note  Patient: Linda [name]             Date of Birth: 1941/06/22           MRN: [id number]             Visit Date: 09/01/2021 HPI: [name] returns today requesting cortisone injections both knees.  She is awaiting approval for supplemental injections both knees.  She has had no new injury to either knee.  Cortisone injections have not given her much relief.  Most recent past.  She denies any fevers chills or recent vaccines.  Physical exam: Bilateral knees good range of motion both knees no abnormal warmth erythema or effusion.  Procedures: Visit Diagnoses:  1. Primary osteoarthritis of right knee   2. Primary osteoarthritis of left knee     Large Joint Inj: bilateral knee on 09/01/2021 9:38 AM Indications: pain Details: 22 G 1.5 in needle, anterolateral approach  Arthrogram: No  Medications (Right): 3 mL lidocaine 1 %; 40 mg methylPREDNISolone acetate 40 MG/ML Medications (Left): 3 mL lidocaine 1 %; 40 mg methylPREDNISolone acetate 40 MG/ML Outcome: tolerated well, no immediate complications Procedure, treatment alternatives, risks and benefits explained, specific risks discussed. Consent was given by the patient. Immediately prior to procedure a time out was called to verify the correct patient, procedure, equipment, support staff and site/side marked as required. Patient was prepped and draped in the usual sterile fashion.    Plan: We will have her follow-up with [place] once her supplemental injections are approved.  Questions were encouraged and answered at length.  She knows to wait at least 3 months between cortisone injections.

## 2021-09-01 NOTE — Telephone Encounter (Signed)
Pt came in today for repeat cortisone inj. Artis Delay wanted to know status of gel injections for this pt. Please advise

## 2021-09-02 ENCOUNTER — Telehealth: Payer: Self-pay

## 2021-09-02 NOTE — Telephone Encounter (Signed)
VOB has been submitted for Monovisc, bilateral knee. BV pending. PA required

## 2021-09-02 NOTE — Telephone Encounter (Signed)
PA required for gel injection. Will call patient to discuss.

## 2021-09-05 ENCOUNTER — Telehealth: Payer: Self-pay

## 2021-09-05 DIAGNOSIS — Z23 Encounter for immunization: Secondary | ICD-10-CM | POA: Diagnosis not present

## 2021-09-05 NOTE — Telephone Encounter (Signed)
Approved for Monovisc, bilateral knee. Leonore Patient will be responsible for 20% OOP. Co-pay of $20.00 PA Approval# [id number] Valid 09/03/2021- 10/18/2021

## 2021-09-17 ENCOUNTER — Telehealth: Payer: Self-pay | Admitting: Gastroenterology

## 2021-09-17 NOTE — Telephone Encounter (Signed)
Patient called states she received a letter form her insurance advising her that they will no longer be covering her Dexilant medication.

## 2021-09-17 NOTE — Telephone Encounter (Signed)
Patient states she received a letter in the mail from [facility] stating they will no longer pay for Dexilant starting in 2023. Informed patient that once we get the rejection from the pharmacy in January then we will attempt a PA for the [place] and see if we can get it approved. If not then we will look into alternative medications. Patient agreed.

## 2021-09-22 ENCOUNTER — Encounter: Payer: Self-pay | Admitting: Physician Assistant

## 2021-09-22 ENCOUNTER — Ambulatory Visit: Payer: Medicare HMO | Admitting: Physician Assistant

## 2021-09-22 DIAGNOSIS — M170 Bilateral primary osteoarthritis of knee: Secondary | ICD-10-CM

## 2021-09-22 DIAGNOSIS — M1711 Unilateral primary osteoarthritis, right knee: Secondary | ICD-10-CM

## 2021-09-22 DIAGNOSIS — M1712 Unilateral primary osteoarthritis, left knee: Secondary | ICD-10-CM

## 2021-09-22 MED ORDER — HYALURONAN 88 MG/4ML IX SOSY
88.0000 mg | PREFILLED_SYRINGE | INTRA_ARTICULAR | Status: AC | PRN
  Administered 2021-09-22: 88 mg via INTRA_ARTICULAR

## 2021-09-22 NOTE — Progress Notes (Signed)
   Procedure Note  Patient: Linda [name]             Date of Birth: January 14, 1941           MRN: [id number]             Visit Date: 09/22/2021 HPI: [name] comes in today for scheduled Monovisc injections both knees.  She has known osteoarthritis of both knees.  She has had no new injury.  She does know that she has had some swelling of both knees particularly the left knee.  She states with icing the the swelling goes down.  Patient's failed conservative treatment otherwise.  She has no upcoming knee surgery in the next 6 months.  Physical exam: Bilateral knees good range of motion.  Slight edema left knee no abnormal warmth.  Negative fusion bilaterally.  Procedures: Visit Diagnoses:  1. Primary osteoarthritis of right knee   2. Primary osteoarthritis of left knee     Large Joint Inj: bilateral knee on 09/22/2021 9:28 AM Indications: pain Details: 22 G 1.5 in needle, superolateral approach  Arthrogram: No  Medications (Right): 88 mg Hyaluronan 88 MG/4ML Medications (Left): 88 mg Hyaluronan 88 MG/4ML Outcome: tolerated well, no immediate complications Procedure, treatment alternatives, risks and benefits explained, specific risks discussed. Consent was given by the patient. Immediately prior to procedure a time out was called to verify the correct patient, procedure, equipment, support staff and site/side marked as required. Patient was prepped and draped in the usual sterile fashion.    Plan: She will follow-up with [place] as needed she knows that the injections may take 6 to 8 weeks to begin working.  She also understands to wait at least 6 months between Monovisc injections.  Questions encouraged and answered.

## 2021-10-22 ENCOUNTER — Telehealth: Payer: Self-pay | Admitting: Gastroenterology

## 2021-10-22 NOTE — Telephone Encounter (Signed)
Patient reports an isolated incident last week of bright red blood in her stool. She has not seen any additional bleeding.  She is reassured that this is most likely a hemorrhoid.  She will call back if she has any additional bleeding.

## 2021-10-23 DIAGNOSIS — Z1283 Encounter for screening for malignant neoplasm of skin: Secondary | ICD-10-CM | POA: Diagnosis not present

## 2021-10-23 DIAGNOSIS — Z85828 Personal history of other malignant neoplasm of skin: Secondary | ICD-10-CM | POA: Diagnosis not present

## 2021-10-23 DIAGNOSIS — Z08 Encounter for follow-up examination after completed treatment for malignant neoplasm: Secondary | ICD-10-CM | POA: Diagnosis not present

## 2021-10-24 NOTE — Telephone Encounter (Signed)
Patient called stating that she has not seen anymore blood in her stool. Seeking advice if you think she needs to come in for a OV. Please advise.

## 2021-10-30 DIAGNOSIS — H01002 Unspecified blepharitis right lower eyelid: Secondary | ICD-10-CM | POA: Diagnosis not present

## 2021-10-30 DIAGNOSIS — H25813 Combined forms of age-related cataract, bilateral: Secondary | ICD-10-CM | POA: Diagnosis not present

## 2021-10-30 DIAGNOSIS — H01001 Unspecified blepharitis right upper eyelid: Secondary | ICD-10-CM | POA: Diagnosis not present

## 2021-10-30 DIAGNOSIS — H01004 Unspecified blepharitis left upper eyelid: Secondary | ICD-10-CM | POA: Diagnosis not present

## 2021-11-05 ENCOUNTER — Encounter: Payer: Self-pay | Admitting: Gastroenterology

## 2021-11-06 DIAGNOSIS — E785 Hyperlipidemia, unspecified: Secondary | ICD-10-CM | POA: Diagnosis not present

## 2021-11-06 DIAGNOSIS — I10 Essential (primary) hypertension: Secondary | ICD-10-CM | POA: Diagnosis not present

## 2021-11-09 ENCOUNTER — Ambulatory Visit
Admission: EM | Admit: 2021-11-09 | Discharge: 2021-11-09 | Disposition: A | Discharge status: Home / Self Care | Payer: Medicare HMO | Attending: Urgent Care | Admitting: Urgent Care

## 2021-11-09 ENCOUNTER — Ambulatory Visit (INDEPENDENT_AMBULATORY_CARE_PROVIDER_SITE_OTHER): Payer: Medicare HMO

## 2021-11-09 ENCOUNTER — Other Ambulatory Visit: Payer: Self-pay

## 2021-11-09 DIAGNOSIS — M546 Pain in thoracic spine: Secondary | ICD-10-CM | POA: Diagnosis not present

## 2021-11-09 DIAGNOSIS — R0989 Other specified symptoms and signs involving the circulatory and respiratory systems: Secondary | ICD-10-CM

## 2021-11-09 DIAGNOSIS — J3489 Other specified disorders of nose and nasal sinuses: Secondary | ICD-10-CM | POA: Diagnosis not present

## 2021-11-09 DIAGNOSIS — J069 Acute upper respiratory infection, unspecified: Secondary | ICD-10-CM

## 2021-11-09 DIAGNOSIS — R059 Cough, unspecified: Secondary | ICD-10-CM | POA: Diagnosis not present

## 2021-11-09 DIAGNOSIS — R079 Chest pain, unspecified: Secondary | ICD-10-CM | POA: Diagnosis not present

## 2021-11-09 MED ORDER — PROMETHAZINE-DM 6.25-15 MG/5ML PO SYRP: 5.0000 mL | ORAL_SOLUTION | Freq: Every evening | ORAL | 0 refills | Status: DC | PRN

## 2021-11-09 MED ORDER — CETIRIZINE HCL 10 MG PO TABS: 10.0000 mg | ORAL_TABLET | Freq: Every day | ORAL | 0 refills | Status: DC

## 2021-11-09 MED ORDER — PREDNISONE 20 MG PO TABS: ORAL_TABLET | ORAL | 0 refills | Status: DC

## 2021-11-09 MED ORDER — BENZONATATE 100 MG PO CAPS: 100.0000 mg | ORAL_CAPSULE | Freq: Three times a day (TID) | ORAL | 0 refills | Status: DC | PRN

## 2021-11-09 NOTE — ED Provider Notes (Signed)
[place]   MRN: [id number] DOB: 05/07/1941  Subjective:   Linda [name] is a 81 y.o. female presenting for 4 to 5-day history of persistent sinus pressure, runny and stuffy nose, coughing.  The cough is eliciting some left-sided thoracic back pain.  She would like to be checked for COVID.  Has not had it within the past 6 months.  No history of asthma.  She is not a smoker.  No history of diabetes.  No chest pain, shortness of breath or wheezing.  No current facility-administered medications for this encounter.  Current Outpatient Medications:    ALPRAZolam (XANAX) 0.5 MG tablet, Take 0.5 mg by mouth daily., Disp: , Rfl:    AMBULATORY NON FORMULARY MEDICATION, FDGARD 2 capsules by mouth twice a day, Disp: , Rfl:    amLODipine (NORVASC) 10 MG tablet, Take 10 mg by mouth daily. , Disp: , Rfl:    benazepril (LOTENSIN) 40 MG tablet, Take 40 mg by mouth daily., Disp: , Rfl:    BIOTIN PO, Take 1 tablet by mouth daily., Disp: , Rfl:    brompheniramine-pseudoephedrine-DM 30-2-10 MG/5ML syrup, Take 5 mLs by mouth 3 (three) times daily as needed., Disp: 120 mL, Rfl: 0   calcium carbonate (TUMS - DOSED IN MG ELEMENTAL CALCIUM) 500 MG chewable tablet, Chew 1 tablet by mouth daily., Disp: , Rfl:    desvenlafaxine (PRISTIQ) 100 MG 24 hr tablet, Take 100 mg by mouth daily. , Disp: , Rfl:    dexlansoprazole (DEXILANT) 60 MG capsule, TAKE ONE CAPSULE (60MG  TOTAL) BY MOUTH DAILY, Disp: 30 capsule, Rfl: 11   famotidine (SM ACID REDUCER MAX ST) 20 MG tablet, TAKE ONE TABLET (20MG  TOTAL) BY MOUTH ATBEDTIME, Disp: 30 tablet, Rfl: 5   lamoTRIgine (LAMICTAL) 200 MG tablet, Take 200 mg by mouth 2 (two) times daily., Disp: , Rfl:    methylPREDNISolone (MEDROL) 4 MG tablet, Take as directed, Disp: 21 tablet, Rfl: 0   ondansetron (ZOFRAN) 8 MG tablet, Take 1 tablet (8 mg total) by mouth every 8 (eight) hours as needed for nausea., Disp: 20 tablet, Rfl: 6   rosuvastatin (CRESTOR) 10 MG tablet,  , Disp: , Rfl:    spironolactone (ALDACTONE) 25 MG tablet, Take 1 tablet (25 mg total) by mouth daily., Disp: 90 tablet, Rfl: 2   Allergies  Allergen Reactions   Amoxicillin Hives   Cefdinir Other (See Comments)   Other Other (See Comments)   Penicillin G Other (See Comments)   Abilify [Aripiprazole] Other (See Comments)    Makes pt tremble    Past Medical History:  Diagnosis Date   Anxiety    Candida esophagitis (HCC)    Chronic kidney disease    DDD (degenerative disc disease)    Depression    Erosive gastritis    Falls    GERD (gastroesophageal reflux disease)    Hiatal hernia    High cholesterol    History of anal fissures    Hypertension    Obesity    Osteoarthritis    Panic disorder    Sleep apnea    Does not need CPAP anymore     Past Surgical History:  Procedure Laterality Date   ABDOMINAL HYSTERECTOMY  1987   LSO / menorrhagia & leiomyomata   APPENDECTOMY     BREAST EXCISIONAL BIOPSY Left 1989   benign   BREAST EXCISIONAL BIOPSY Left 07/11/2020   benign   BREAST SURGERY  1989   benign tumor left breast   CHOLECYSTECTOMY  2001   TUBAL LIGATION      Family History  Problem Relation Age of Onset   Heart disease Mother    Lung cancer Father    Diabetes Sister    Hypertension Sister    Diabetes Brother    Heart disease Brother    Heart attack Brother    Ovarian cancer Sister    Heart disease Brother    Heart attack Brother    Heart disease Brother    Heart attack Brother    Colon cancer Neg Hx     Social History   Tobacco Use   Smoking status: Never   Smokeless tobacco: Never  Vaping Use   Vaping Use: Never used  Substance Use Topics   Alcohol use: No   Drug use: No    ROS   Objective:   Vitals: BP 114/70 (BP Location: Right Arm)    Pulse (!) 56    Temp 99.1 F (37.3 C) (Oral)    Resp (!) 26    SpO2 95%   Physical Exam Constitutional:      General: She is not in acute distress.    Appearance: Normal appearance. She is  well-developed. She is not ill-appearing, toxic-appearing or diaphoretic.  HENT:     Head: Normocephalic and atraumatic.     Nose: Congestion and rhinorrhea present.     Mouth/Throat:     Mouth: Mucous membranes are moist.  Eyes:     General: No scleral icterus.       Right eye: No discharge.        Left eye: No discharge.     Extraocular Movements: Extraocular movements intact.  Cardiovascular:     Rate and Rhythm: Normal rate.     Heart sounds: No murmur heard.   No friction rub. No gallop.  Pulmonary:     Effort: Pulmonary effort is normal. No respiratory distress.     Breath sounds: No stridor. Rhonchi (both lung fields mid-lower bases) present. No wheezing or rales.  Chest:     Chest wall: No tenderness.  Skin:    General: Skin is warm and dry.  Neurological:     General: No focal deficit present.     Mental Status: She is alert and oriented to person, place, and time.  Psychiatric:        Mood and Affect: Mood normal.        Behavior: Behavior normal.        Thought Content: Thought content normal.        Judgment: Judgment normal.   DG Chest 2 View  Result Date: 11/09/2021 CLINICAL DATA:  Cough, left-sided thoracic pain EXAM: CHEST - 2 VIEW COMPARISON:  None. FINDINGS: The heart size and mediastinal contours are within normal limits. Both lungs are clear. Disc degenerative disease of the thoracic spine. IMPRESSION: No acute abnormality of the lungs. Electronically Signed   By: Delanna Ahmadi M.D.   On: 11/09/2021 14:19     Assessment and Plan :   PDMP not reviewed this encounter.  1. Viral URI with cough   2. Stuffy and runny nose   3. Rhonchi    Will use prednisone course given her lung sounds. Will manage for viral illness such as viral URI, viral syndrome, viral rhinitis, COVID-19. Recommended supportive care. Offered scripts for symptomatic relief. Testing is pending. Counseled patient on potential for adverse effects with medications prescribed/recommended  today, ER and return-to-clinic precautions discussed, patient verbalized understanding.     Bess Harvest,  Freida Busman, PA-C 11/09/21 1428

## 2021-11-09 NOTE — ED Triage Notes (Signed)
Pt reports cough, sinus pressure, runny nose x 4-5 days.

## 2021-11-10 LAB — NOVEL CORONAVIRUS, NAA: SARS-CoV-2, NAA: NOT DETECTED

## 2021-11-10 LAB — SARS-COV-2, NAA 2 DAY TAT

## 2021-11-13 DIAGNOSIS — R9431 Abnormal electrocardiogram [ECG] [EKG]: Secondary | ICD-10-CM | POA: Diagnosis not present

## 2021-11-13 DIAGNOSIS — I739 Peripheral vascular disease, unspecified: Secondary | ICD-10-CM | POA: Diagnosis not present

## 2021-11-13 DIAGNOSIS — I4510 Unspecified right bundle-branch block: Secondary | ICD-10-CM | POA: Diagnosis not present

## 2021-11-13 DIAGNOSIS — M25561 Pain in right knee: Secondary | ICD-10-CM | POA: Diagnosis not present

## 2021-11-13 DIAGNOSIS — I10 Essential (primary) hypertension: Secondary | ICD-10-CM | POA: Diagnosis not present

## 2021-11-13 DIAGNOSIS — K5909 Other constipation: Secondary | ICD-10-CM | POA: Diagnosis not present

## 2021-11-13 DIAGNOSIS — R82998 Other abnormal findings in urine: Secondary | ICD-10-CM | POA: Diagnosis not present

## 2021-11-13 DIAGNOSIS — Z0000 Encounter for general adult medical examination without abnormal findings: Secondary | ICD-10-CM | POA: Diagnosis not present

## 2021-11-13 DIAGNOSIS — J40 Bronchitis, not specified as acute or chronic: Secondary | ICD-10-CM | POA: Diagnosis not present

## 2021-11-13 DIAGNOSIS — F339 Major depressive disorder, recurrent, unspecified: Secondary | ICD-10-CM | POA: Diagnosis not present

## 2021-11-13 DIAGNOSIS — E785 Hyperlipidemia, unspecified: Secondary | ICD-10-CM | POA: Diagnosis not present

## 2021-11-20 DIAGNOSIS — J40 Bronchitis, not specified as acute or chronic: Secondary | ICD-10-CM | POA: Diagnosis not present

## 2021-12-10 ENCOUNTER — Ambulatory Visit: Payer: Medicare HMO | Admitting: Gastroenterology

## 2021-12-10 ENCOUNTER — Other Ambulatory Visit: Payer: Self-pay | Admitting: Internal Medicine

## 2021-12-10 DIAGNOSIS — I10 Essential (primary) hypertension: Secondary | ICD-10-CM

## 2021-12-12 ENCOUNTER — Telehealth: Payer: Self-pay | Admitting: Internal Medicine

## 2021-12-12 DIAGNOSIS — I10 Essential (primary) hypertension: Secondary | ICD-10-CM

## 2021-12-12 MED ORDER — SPIRONOLACTONE 25 MG PO TABS: 25.0000 mg | ORAL_TABLET | Freq: Every day | ORAL | 0 refills | Status: DC

## 2021-12-12 NOTE — Telephone Encounter (Signed)
Refills has been sent to the pharmacy. 

## 2021-12-12 NOTE — Telephone Encounter (Signed)
°*  STAT* If patient is at the pharmacy, call can be transferred to refill team.   1. Which medications need to be refilled? (please list name of each medication and dose if known)  spironolactone (ALDACTONE) 25 MG tablet  2. Which pharmacy/location (including street and city if local pharmacy) is medication to be sent to? [facility]  3. Do they need a 30 day or 90 day supply? 90 day

## 2021-12-15 DIAGNOSIS — R001 Bradycardia, unspecified: Secondary | ICD-10-CM | POA: Insufficient documentation

## 2021-12-15 DIAGNOSIS — J309 Allergic rhinitis, unspecified: Secondary | ICD-10-CM | POA: Insufficient documentation

## 2021-12-16 ENCOUNTER — Encounter: Payer: Self-pay | Admitting: Nurse Practitioner

## 2021-12-16 ENCOUNTER — Ambulatory Visit: Payer: Medicare HMO | Admitting: Nurse Practitioner

## 2021-12-16 VITALS — BP 128/60 | HR 72 | Ht 59.5 in | Wt 173.2 lb

## 2021-12-16 DIAGNOSIS — K625 Hemorrhage of anus and rectum: Secondary | ICD-10-CM

## 2021-12-16 NOTE — Progress Notes (Signed)
12/16/2021 Linda [name] [id number] 23-Jul-1941   Chief Complaint: blood with bowel movements   History of Present Illness: Linda [name]. Linda [name] is a 81 year old female with a past medical history of anxiety, depression, hypertension, hypercholesterolemia, sleep apnea, obesity, GERD, anal fissure and colon polyps.  She is followed by Dr. Fuller Plan.  She presents to our office today for further evaluation regarding rectal bleeding.  Approximately 1 month ago, she passed a normal formed brown bowel movement with a small amount of bright red blood on the toilet tissue and on the stool.  2 days later, she possibly strained and passed a harder stool and saw a few drops of bright red blood in the toilet water and approximately 1 week later she saw small amount of bright red blood on the toilet tissue without recurrence.  She reports having a remote history of an anal fissure in the past.  She suspects she might have hemorrhoids.  No associated anorectal pain.  She has infrequent nausea that is well controlled by taking Zofran as needed.  She has infrequent epigastric burning discomfort and rare acid regurgitation.  No dysphagia.  She underwent an EGD and colonoscopy by Dr. Fuller Plan 11/19/2020.  The EGD identified a small hiatal hernia, her esophagus was normal and was empirically dilated due to having dysphagia symptoms.  The colonoscopy identified 6 tubular adenomatous polyps which were removed from the cecum and transverse colon.  No further colon polyp surveillance colonoscopies recommended due to age.  Laboratory studies 11/06/2021: WBC 7.33.  Hemoglobin 13.4.  Hematocrit 38.5.  Platelet 283.  Glucose 99.  BUN 8.  Creatinine 1.0.  Sodium 140.  Potassium 4.5.  Calcium 9.6.  Albumin 4.1.  AST 20.  ALT 18.  Alk phos 91.  Total bili 0.4.  EGD 11/19/2020: - No endoscopic esophageal abnormality to explain patient's dysphagia. Esophagus dilated. - Small hiatal hernia. - Normal duodenal bulb and second portion of  the duodenum. - No specimens collected. - Follow up in GI office in 1 year  Colonoscopy 11/19/2020: Two 2 to 4 mm polyps in the cecum, removed with a cold biopsy forceps. Resected and retrieved. - Four 5 to 8 mm polyps in the transverse colon, removed with a cold snare. Resected and retrieved. - The examination was otherwise normal on direct and retroflexion views. TUBULAR ADENOMA(S). - NO HIGH GRADE DYSPLASIA OR CARCINOMA.  Past Medical History:  Diagnosis Date   Anxiety    Candida esophagitis (HCC)    Chronic kidney disease    DDD (degenerative disc disease)    Depression    Erosive gastritis    Falls    GERD (gastroesophageal reflux disease)    Hiatal hernia    High cholesterol    History of anal fissures    Hypertension    Obesity    Osteoarthritis    Panic disorder    Sleep apnea    Does not need CPAP anymore   Current Outpatient Medications on File Prior to Visit  Medication Sig Dispense Refill   ALPRAZolam (XANAX) 0.5 MG tablet Take 0.5 mg by mouth daily.     amLODipine (NORVASC) 10 MG tablet Take 10 mg by mouth daily.      benazepril (LOTENSIN) 40 MG tablet Take 40 mg by mouth daily.     calcium carbonate (TUMS - DOSED IN MG ELEMENTAL CALCIUM) 500 MG chewable tablet Chew 1 tablet by mouth daily.     Calcium-Vitamin D-Vitamin K (VIACTIV CALCIUM PLUS D PO)  Take 2 tablets by mouth 2 (two) times daily.     Caraway Oil-Levomenthol (FDGARD) 25-20.75 MG CAPS Take 2 capsules by mouth in the morning and at bedtime.     desvenlafaxine (PRISTIQ) 100 MG 24 hr tablet Take 100 mg by mouth daily.      dexlansoprazole (DEXILANT) 60 MG capsule TAKE ONE CAPSULE (60MG TOTAL) BY MOUTH DAILY 30 capsule 11   lamoTRIgine (LAMICTAL) 150 MG tablet Take 300 mg by mouth daily.     Multiple Vitamins-Minerals (ONE-A-DAY WOMENS 50+ ADVANTAGE) TABS Take 1 tablet by mouth daily.     ondansetron (ZOFRAN) 8 MG tablet Take 1 tablet (8 mg total) by mouth every 8 (eight) hours as needed for nausea. 20  tablet 6   propranolol (INDERAL) 10 MG tablet Take 10 mg by mouth 2 (two) times daily.     rosuvastatin (CRESTOR) 10 MG tablet      spironolactone (ALDACTONE) 25 MG tablet Take 1 tablet (25 mg total) by mouth daily. 90 tablet 0   triamcinolone cream (KENALOG) 0.1 % apply to the itching areas TID prn     No current facility-administered medications on file prior to visit.   Allergies  Allergen Reactions   Amoxicillin Hives   Cefdinir Other (See Comments)   Other Other (See Comments)   Penicillin G Other (See Comments)   Abilify [Aripiprazole] Other (See Comments)    Makes pt tremble   Current Medications, Allergies, Past Medical History, Past Surgical History, Family History and Social History were reviewed in [employer] record.  Review of Systems:   Constitutional: Negative for fever, sweats, chills or weight loss.  Respiratory: Negative for shortness of breath.   Cardiovascular: Negative for chest pain, palpitations and leg swelling.  Gastrointestinal: See HPI.  Musculoskeletal: Negative for back pain or muscle aches.  Neurological: Negative for dizziness, headaches or paresthesias.    Physical Exam: BP 128/60 (BP Location: Left Arm, Patient Position: Sitting, Cuff Size: Normal)    Pulse 72    Ht 4' 11.5" (1.511 m) Comment: height measured without shoes   Wt 173 lb 4 oz (78.6 kg)    BMI 34.41 kg/m  General: 81 year old female in no acute distress. Head: Normocephalic and atraumatic. Eyes: No scleral icterus. Conjunctiva pink . Ears: Normal auditory acuity. Mouth: Dentition intact. No ulcers or lesions.  Lungs: Clear throughout to auscultation. Heart: Regular rate and rhythm, no murmur. Abdomen: Soft, nontender and nondistended. No masses or hepatomegaly. Normal bowel sounds x 4 quadrants.  Rectal: Patient declined exam. Musculoskeletal: Symmetrical with no gross deformities. Extremities: No edema. Neurological: Alert oriented x 4. No focal deficits.   Psychological: Alert and cooperative. Normal mood and affect  Assessment and Recommendations:  52) 82 year old female with rectal bleeding.  Remote history of an anal fissure. -Patient declined rectal exam today, however, she will contact her office if her rectal bleeding persists or worsens -Benefiber 1 tablespoon daily -MiraLAX nightly as needed as tolerated to facilitate easy bowel movements -Apply a small amount of Desitin inside the anal opening and to the external anal area tid as needed for anal or hemorrhoidal irritation/bleeding.   2) GERD, stable on Dexilant 60 mg daily  3) Six tubular adenomatous polyps measuring 2 to 8 mm removed from the cecum and transverse colon per colonoscopy 11/19/2020 -No further colon polyp surveillance colonoscopies recommended due to age  62) Infrequent nausea -Zofran as needed as previously prescribed -Patient to contact office if nausea symptoms worsen

## 2021-12-16 NOTE — Patient Instructions (Signed)
RECOMMENDATIONS:  Benefiber- 1 tablespoon daily as tolerated. Miralax- Dissolve one capful in 8 ounces of water and drink before bed. Desitin: Apply a small amount to the external and internal anal area three times a day as needed. Please contact our office if rectal bleeding worsens.  Thank you for trusting me with your gastrointestinal care!    Noralyn Pick, CRNP    BMI:  If you are age 79 or older, your body mass index should be between 23-30. Your Body mass index is 34.41 kg/m. If this is out of the aforementioned range listed, please consider follow up with your Primary Care Provider.  If you are age 65 or younger, your body mass index should be between 19-25. Your Body mass index is 34.41 kg/m. If this is out of the aformentioned range listed, please consider follow up with your Primary Care Provider.   MY CHART:  The [facility] providers would like to encourage you to use [facility] to communicate with providers for non-urgent requests or questions.  Due to long hold times on the telephone, sending your provider a message by [facility] may be a faster and more efficient way to get a response.  Please allow 48 business hours for a response.  Please remember that this is for non-urgent requests.

## 2021-12-22 ENCOUNTER — Encounter (HOSPITAL_COMMUNITY)
Admission: RE | Admit: 2021-12-22 | Discharge: 2021-12-22 | Disposition: A | Discharge status: Home / Self Care | Payer: Medicare HMO | Source: Ambulatory Visit | Attending: Ophthalmology | Admitting: Ophthalmology

## 2021-12-22 ENCOUNTER — Encounter (HOSPITAL_COMMUNITY): Payer: Self-pay

## 2021-12-22 ENCOUNTER — Other Ambulatory Visit: Payer: Self-pay

## 2021-12-22 HISTORY — DX: Other specified postprocedural states: R11.2

## 2021-12-22 HISTORY — DX: Other specified postprocedural states: Z98.890

## 2021-12-22 NOTE — Progress Notes (Signed)
?   12/22/21 1058  ?OBSTRUCTIVE SLEEP APNEA  ?Have you ever been diagnosed with sleep apnea through a sleep study? No  ?Do you snore loudly (loud enough to be heard through closed doors)?  1  ?Do you often feel tired, fatigued, or sleepy during the daytime (such as falling asleep during driving or talking to someone)? 0  ?Has anyone observed you stop breathing during your sleep? 1  ?Do you have, or are you being treated for high blood pressure? 1  ?BMI more than 35 kg/m2? 1  ?Age > 41 (1-yes) 1  ?Neck circumference greater than:Female 16 inches or larger, Female 17inches or larger? 0  ?Female Gender (Yes=1) 0  ?Obstructive Sleep Apnea Score 5  ?Score 5 or greater  Results sent to PCP  ? ? ?

## 2021-12-23 ENCOUNTER — Telehealth: Payer: Self-pay | Admitting: Gastroenterology

## 2021-12-23 NOTE — H&P (Signed)
Surgical History & Physical ? ?Patient Name: Linda [name] DOB: 07-03-1941 ? ?Surgery: Cataract extraction with intraocular lens implant phacoemulsification; Left Eye ? ?Surgeon: Baruch Goldmann MD ?Surgery Date:  12-29-21 ?Pre-Op Date:  12-22-21 ? ?HPI: ?A 38 Yr. old female patient Pt referred by Dr. Jorja Loa for cataract evaluation. The patient complains of difficulty when reading fine print, books, newspaper, instructions etc., which began 2-4 year ago. Both eyes are affected. The episode is gradual. The condition's severity is worsening. The complaint is associated with blurry vision, glare and halos. PT has decreased night driving due to symptoms. This is negatively affecting the patient's quality of life and the patient is unable to function adequately in life with the current level of vision. Pt taking Systane prn OU. Pt denies any eye pain or increase in floaters/flashes of light. HPI was performed by Baruch Goldmann . ? ?Medical History: ?Dry Eyes ?Macula Degeneration ?Cataracts ?Macula Degeneration ?Glaucoma ?Arthritis ?High Blood Pressure ?LDL ? ?Review of Systems ?Negative Allergic/Immunologic ?Negative Cardiovascular ?Negative Constitutional ?Negative Ear, Nose, Mouth & Throat ?Negative Endocrine ?Negative Eyes ?Negative Gastrointestinal ?Negative Genitourinary ?Negative Hemotologic/Lymphatic ?Negative Integumentary ?Negative Musculoskeletal ?Negative Neurological ?Negative Psychiatry ?Negative Respiratory ? ?Social ?  Never smoked  ? ?Medication ?Rosuvastatin, Dexilant, Benazepril, Amlodipine, Alprazolam, Lamictal, Desvenlafaxine Succinate, Spironolactone, Propranolol, Ondansetron, Acid reducer, Multivitamin, Hair Vitamins,  ? ?Sx/Procedures ? None ? ?Drug Allergies  ?Abilify, Amoxicillin,  ? ?History & Physical: ?Heent: Cataract, Left Eye ?NECK: supple without bruits ?LUNGS: lungs clear to auscultation ?CV: regular rate and rhythm ?Abdomen: soft and non-tender ? ?Impression & Plan: ?Assessment: ?1.   COMBINED FORMS AGE RELATED CATARACT; Both Eyes (H25.813) ?2.  BLEPHARITIS; Right Upper Lid, Right Lower Lid, Left Upper Lid, Left Lower Lid (H01.001, H01.002,H01.004,H01.005) ?3.  DERMATOCHALASIS, no surgery; Right Upper Lid, Left Upper Lid (X38.182, X93.716) ? ?Plan: 1.  Cataract accounts for the patient's decreased vision. This visual impairment is not correctable with a tolerable change in glasses or contact lenses. Cataract surgery with an implantation of a new lens should significantly improve the visual and functional status of the patient. Discussed all risks, benefits, alternatives, and potential complications. Discussed the procedures and recovery. Patient desires to have surgery. A-scan ordered and performed today for intra-ocular lens calculations. The surgery will be performed in order to improve vision for driving, reading, and for eye examinations. Recommend phacoemulsification with intra-ocular lens. Recommend Dextenza for post-operative pain and inflammation. ?Left Eye. ?Dilates well - shugarcaine by protocol. ? ?2.  Recommend regular lid cleaning. ? ?3.  Asymptomatic, recommend observation for now. Findings, prognosis and treatment options reviewed. ?

## 2021-12-23 NOTE — Telephone Encounter (Signed)
Inbound call from patient stated that she got a letter in the mail from her insurance that she could get a discount on some of her medications. Patient is wanting to discuss this with you. Please advise.   ?

## 2021-12-23 NOTE — Telephone Encounter (Signed)
Patient called in to see if it would be possible to see what other options she could try in replace of dexilant. She states she received a letter from her insurance company stating she could change medication to omeprazole to lower the cost, and would like to have MD recommendation to see if this is possible. ? ?Colleen, please advise. Thank you!  ?

## 2021-12-24 ENCOUNTER — Other Ambulatory Visit: Payer: Self-pay

## 2021-12-24 DIAGNOSIS — K219 Gastro-esophageal reflux disease without esophagitis: Secondary | ICD-10-CM

## 2021-12-24 MED ORDER — OMEPRAZOLE 40 MG PO CPDR: 40.0000 mg | DELAYED_RELEASE_CAPSULE | Freq: Every day | ORAL | 1 refills | Status: DC

## 2021-12-24 MED ORDER — FAMOTIDINE 20 MG PO TABS: 20.0000 mg | ORAL_TABLET | Freq: Every evening | ORAL | 1 refills | Status: DC | PRN

## 2021-12-24 NOTE — Telephone Encounter (Signed)
Rx's sent to pt preferred pharmacy per Carl Best, NP's request. Called pt and informed her about new orders. Advised she call if these Rx's do not seem to help control her symptoms. Verbalized acceptance, understanding and appreciation. ?

## 2021-12-24 NOTE — Telephone Encounter (Signed)
Ammie, pls contact patient and let her know she can try Omeprazole '40mg'$  one po QD, send RX for # 90, 1 refill. Stop Dexilant. Pt to contact our office if GERD sx are not well controlled on Omeprazole. If needed, she cant take Pepcid '20mg'$  one po QD PRN for any breakthrough reflux symptoms. THX ?

## 2021-12-25 DIAGNOSIS — H25812 Combined forms of age-related cataract, left eye: Secondary | ICD-10-CM | POA: Diagnosis not present

## 2021-12-29 ENCOUNTER — Encounter (HOSPITAL_COMMUNITY): Payer: Self-pay | Admitting: Ophthalmology

## 2021-12-29 ENCOUNTER — Ambulatory Visit (HOSPITAL_COMMUNITY)
Admission: RE | Admit: 2021-12-29 | Discharge: 2021-12-29 | Disposition: A | Discharge status: Home / Self Care | Payer: Medicare HMO | Attending: Ophthalmology | Admitting: Ophthalmology

## 2021-12-29 ENCOUNTER — Ambulatory Visit (HOSPITAL_COMMUNITY): Payer: Medicare HMO | Admitting: Anesthesiology

## 2021-12-29 ENCOUNTER — Ambulatory Visit (HOSPITAL_BASED_OUTPATIENT_CLINIC_OR_DEPARTMENT_OTHER): Payer: Medicare HMO | Admitting: Anesthesiology

## 2021-12-29 ENCOUNTER — Encounter (HOSPITAL_COMMUNITY)
Admission: RE | Disposition: A | Discharge status: Home / Self Care | Payer: Self-pay | Source: Home / Self Care | Attending: Ophthalmology

## 2021-12-29 DIAGNOSIS — F32A Depression, unspecified: Secondary | ICD-10-CM | POA: Diagnosis not present

## 2021-12-29 DIAGNOSIS — H0100B Unspecified blepharitis left eye, upper and lower eyelids: Secondary | ICD-10-CM | POA: Diagnosis not present

## 2021-12-29 DIAGNOSIS — F419 Anxiety disorder, unspecified: Secondary | ICD-10-CM | POA: Diagnosis not present

## 2021-12-29 DIAGNOSIS — G4730 Sleep apnea, unspecified: Secondary | ICD-10-CM | POA: Insufficient documentation

## 2021-12-29 DIAGNOSIS — H25813 Combined forms of age-related cataract, bilateral: Secondary | ICD-10-CM | POA: Insufficient documentation

## 2021-12-29 DIAGNOSIS — H02831 Dermatochalasis of right upper eyelid: Secondary | ICD-10-CM | POA: Diagnosis not present

## 2021-12-29 DIAGNOSIS — H02834 Dermatochalasis of left upper eyelid: Secondary | ICD-10-CM | POA: Insufficient documentation

## 2021-12-29 DIAGNOSIS — H5712 Ocular pain, left eye: Secondary | ICD-10-CM

## 2021-12-29 DIAGNOSIS — H25812 Combined forms of age-related cataract, left eye: Secondary | ICD-10-CM | POA: Diagnosis not present

## 2021-12-29 DIAGNOSIS — Z79899 Other long term (current) drug therapy: Secondary | ICD-10-CM | POA: Diagnosis not present

## 2021-12-29 DIAGNOSIS — H0100A Unspecified blepharitis right eye, upper and lower eyelids: Secondary | ICD-10-CM | POA: Diagnosis not present

## 2021-12-29 DIAGNOSIS — K219 Gastro-esophageal reflux disease without esophagitis: Secondary | ICD-10-CM | POA: Diagnosis not present

## 2021-12-29 DIAGNOSIS — N189 Chronic kidney disease, unspecified: Secondary | ICD-10-CM | POA: Diagnosis not present

## 2021-12-29 DIAGNOSIS — I129 Hypertensive chronic kidney disease with stage 1 through stage 4 chronic kidney disease, or unspecified chronic kidney disease: Secondary | ICD-10-CM | POA: Diagnosis not present

## 2021-12-29 DIAGNOSIS — I10 Essential (primary) hypertension: Secondary | ICD-10-CM | POA: Insufficient documentation

## 2021-12-29 SURGERY — CATARACT EXTRACTION PHACO AND INTRAOCULAR LENS PLACEMENT (IOC) with placement of Corticosteroid
Anesthesia: Monitor Anesthesia Care | Site: Eye | Laterality: Left

## 2021-12-29 MED ORDER — PHENYLEPHRINE HCL 2.5 % OP SOLN
1.0000 [drp] | OPHTHALMIC | Status: AC | PRN
  Administered 2021-12-29 (×3): 1 [drp] via OPHTHALMIC

## 2021-12-29 MED ORDER — STERILE WATER FOR IRRIGATION IR SOLN
Status: DC | PRN
  Administered 2021-12-29: 250 mL

## 2021-12-29 MED ORDER — BSS IO SOLN
INTRAOCULAR | Status: DC | PRN
  Administered 2021-12-29: 15 mL via INTRAOCULAR

## 2021-12-29 MED ORDER — LIDOCAINE HCL 3.5 % OP GEL
1.0000 "application " | Freq: Once | OPHTHALMIC | Status: AC
  Administered 2021-12-29: 1 via OPHTHALMIC

## 2021-12-29 MED ORDER — LACTATED RINGERS IV SOLN: INTRAVENOUS | Status: DC

## 2021-12-29 MED ORDER — TROPICAMIDE 1 % OP SOLN
1.0000 [drp] | OPHTHALMIC | Status: AC | PRN
  Administered 2021-12-29 (×3): 1 [drp] via OPHTHALMIC
  Filled 2021-12-29: qty 3

## 2021-12-29 MED ORDER — DEXAMETHASONE 0.4 MG OP INST
VAGINAL_INSERT | OPHTHALMIC | Status: DC | PRN
  Administered 2021-12-29: 0.4 mg via OPHTHALMIC

## 2021-12-29 MED ORDER — TETRACAINE HCL 0.5 % OP SOLN
1.0000 [drp] | OPHTHALMIC | Status: AC | PRN
  Administered 2021-12-29 (×3): 1 [drp] via OPHTHALMIC

## 2021-12-29 MED ORDER — POVIDONE-IODINE 5 % OP SOLN
OPHTHALMIC | Status: DC | PRN
  Administered 2021-12-29: 1 via OPHTHALMIC

## 2021-12-29 MED ORDER — LIDOCAINE HCL (PF) 1 % IJ SOLN
INTRAOCULAR | Status: DC | PRN
  Administered 2021-12-29: 1 mL via OPHTHALMIC

## 2021-12-29 MED ORDER — EPINEPHRINE PF 1 MG/ML IJ SOLN
INTRAMUSCULAR | Status: AC
  Filled 2021-12-29: qty 2

## 2021-12-29 MED ORDER — EPINEPHRINE PF 1 MG/ML IJ SOLN
INTRAOCULAR | Status: DC | PRN
  Administered 2021-12-29: 500 mL

## 2021-12-29 MED ORDER — SODIUM HYALURONATE 10 MG/ML IO SOLUTION
PREFILLED_SYRINGE | INTRAOCULAR | Status: DC | PRN
  Administered 2021-12-29: 0.85 mL via INTRAOCULAR

## 2021-12-29 MED ORDER — SODIUM HYALURONATE 23MG/ML IO SOSY
PREFILLED_SYRINGE | INTRAOCULAR | Status: DC | PRN
  Administered 2021-12-29: 0.6 mL via INTRAOCULAR

## 2021-12-29 SURGICAL SUPPLY — 14 items
CATARACT SUITE SIGHTPATH (MISCELLANEOUS) ×2 IMPLANT
CLOTH BEACON ORANGE TIMEOUT ST (SAFETY) ×2 IMPLANT
EYE SHIELD UNIVERSAL CLEAR (GAUZE/BANDAGES/DRESSINGS) ×1 IMPLANT
FEE CATARACT SUITE SIGHTPATH (MISCELLANEOUS) ×1 IMPLANT
GLOVE SURG UNDER POLY LF SZ7 (GLOVE) ×2 IMPLANT
LENS IOL RAYNER 16.0 (Intraocular Lens) ×2 IMPLANT
LENS IOL RAYONE EMV 16.0 (Intraocular Lens) IMPLANT
NDL HYPO 18GX1.5 BLUNT FILL (NEEDLE) ×1 IMPLANT
NEEDLE HYPO 18GX1.5 BLUNT FILL (NEEDLE) ×2 IMPLANT
PAD ARMBOARD 7.5X6 YLW CONV (MISCELLANEOUS) ×2 IMPLANT
SYR TB 1ML LL NO SAFETY (SYRINGE) ×2 IMPLANT
TAPE SURG TRANSPORE 1 IN (GAUZE/BANDAGES/DRESSINGS) IMPLANT
TAPE SURGICAL TRANSPORE 1 IN (GAUZE/BANDAGES/DRESSINGS) ×2
WATER STERILE IRR 250ML POUR (IV SOLUTION) ×2 IMPLANT

## 2021-12-29 NOTE — Transfer of Care (Signed)
Immediate Anesthesia Transfer of Care Note ? ?Patient: Linda [name] ? ?Procedure(s) Performed: CATARACT EXTRACTION PHACO AND INTRAOCULAR LENS PLACEMENT (IOC) with placement of Corticosteroid (Left: Eye) ? ?Patient Location: PACU ? ?Anesthesia Type:MAC ? ?Level of Consciousness: awake, alert , oriented and patient cooperative ? ?Airway & Oxygen Therapy: Patient Spontanous Breathing ? ?Post-op Assessment: Report given to RN, Post -op Vital signs reviewed and stable and Patient moving all extremities X 4 ? ?Post vital signs: Reviewed and stable ? ?Last Vitals:  ?Vitals Value Taken Time  ?BP    ?Temp    ?Pulse    ?Resp    ?SpO2    ? ? ?Last Pain:  ?Vitals:  ? 12/29/21 1024  ?PainSc: 0-No pain  ?   ? ?  ? ?Complications: No notable events documented. ?

## 2021-12-29 NOTE — Anesthesia Postprocedure Evaluation (Signed)
Anesthesia Post Note ? ?Patient: Linda [name] ? ?Procedure(s) Performed: CATARACT EXTRACTION PHACO AND INTRAOCULAR LENS PLACEMENT (IOC) with placement of Corticosteroid (Left: Eye) ? ?Patient location during evaluation: Phase II ?Anesthesia Type: MAC ?Level of consciousness: awake ?Pain management: pain level controlled ?Vital Signs Assessment: post-procedure vital signs reviewed and stable ?Respiratory status: spontaneous breathing and respiratory function stable ?Cardiovascular status: blood pressure returned to baseline and stable ?Postop Assessment: no headache and no apparent nausea or vomiting ?Anesthetic complications: no ?Comments: Late entry ? ? ?No notable events documented. ? ? ?Last Vitals:  ?Vitals:  ? 12/29/21 1045 12/29/21 1121  ?BP:  (!) 128/57  ?Pulse: 64 65  ?Resp: 20 18  ?Temp:  36.9 ?C  ?SpO2: 100% 96%  ?  ?Last Pain:  ?Vitals:  ? 12/29/21 1121  ?TempSrc: Oral  ?PainSc: 0-No pain  ? ? ?  ?  ?  ?  ?  ?  ? ?Louann Sjogren ? ? ? ? ?

## 2021-12-29 NOTE — Op Note (Signed)
Date of procedure: 12/29/21 ? 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 ?Post-operative diagnosis:  ?Visually significant age-related combined cataract, Left Eye (H25.812) ?2.   Pain and inflammation following cataract surgery, Left Eye (H57.12) ? ?Procedure:  ?Removal of cataract via phacoemulsification and insertion of intra-ocular lens Rayner RAO200E +16.0D into the capsular bag of the Left Eye ?2. Placement of Dextenza Implant, Left Lower Lid ? ?Attending surgeon: Gerda Diss. Marisa Hua, MD, MA ? ?Anesthesia: MAC, Topical Akten ? ?Complications: None ? ?Estimated Blood Loss: <70m (minimal) ? ?Specimens: None ? ?Implants: As above ? ?Indications:  Visually significant age-related cataract, Left Eye ? ?Procedure:  ?The patient was seen and identified in the pre-operative area. The operative eye was identified and dilated.  The operative eye was marked.  Topical anesthesia was administered to the operative eye.    ? ?The patient was then to the operative suite and placed in the supine position.  A timeout was performed confirming the patient, procedure to be performed, and all other relevant information.   The patient's face was prepped and draped in the usual fashion for intra-ocular surgery.  A lid speculum was placed into the operative eye and the surgical microscope moved into place and focused.  An inferotemporal paracentesis was created using a 20 gauge paracentesis blade.  Shugarcaine was injected into the anterior chamber.  Viscoelastic was injected into the anterior chamber.  A temporal clear-corneal main wound incision was created using a 2.466mmicrokeratome.  A continuous curvilinear capsulorrhexis was initiated using an irrigating cystitome and completed using capsulorrhexis forceps.  Hydrodissection and hydrodeliniation were performed.  Viscoelastic was injected into the anterior chamber.  A phacoemulsification handpiece and a chopper as a second  instrument were used to remove the nucleus and epinucleus. The irrigation/aspiration handpiece was used to remove any remaining cortical material.  ? ?The capsular bag was reinflated with viscoelastic, checked, and found to be intact.  The intraocular lens was inserted into the capsular bag.  The irrigation/aspiration handpiece was used to remove any remaining viscoelastic.  The clear corneal wound and paracentesis wounds were then hydrated and checked with Weck-Cels to be watertight.   ? ?The lid-speculum was removed. The lower punctum was dilated. A Dextenza implant was placed in the lower canaliculus without complication. ? ?The drape was removed.  The patient's face was cleaned with a wet and dry 4x4.   A clear shield was taped over the eye. The patient was taken to the post-operative care unit in good condition, having tolerated the procedure well. ? ?Post-Op Instructions: [facility] and will receive all other orders and instructions. ? ?

## 2021-12-29 NOTE — Discharge Instructions (Signed)
Please discharge patient when stable, will follow up today with Dr. Monifa Blanchette at the [facility] [place] office immediately following discharge.  Leave shield in place until visit.  All paperwork with discharge instructions will be given at the office.  [facility] Address:  [address]  , [place] [address]  

## 2021-12-29 NOTE — Interval H&P Note (Signed)
History and Physical Interval Note: ? ?12/29/2021 ?10:59 AM ? ?Linda [name]  has presented today for surgery, with the diagnosis of combined forms age related cataract; left eye.  The various methods of treatment have been discussed with the patient and family. After consideration of risks, benefits and other options for treatment, the patient has consented to  Procedure(s) with comments: ?CATARACT EXTRACTION PHACO AND INTRAOCULAR LENS PLACEMENT (IOC) with placement of Corticosteroid (Left) - CDE: as a surgical intervention.  The patient's history has been reviewed, patient examined, no change in status, stable for surgery.  I have reviewed the patient's chart and labs.  Questions were answered to the patient's satisfaction.   ? ? ?Baruch Goldmann ? ? ?

## 2021-12-29 NOTE — Anesthesia Preprocedure Evaluation (Signed)
Anesthesia Evaluation  ?Patient identified by MRN, date of birth, ID band ?Patient awake ? ? ? ?Reviewed: ?Allergy & Precautions, H&P , NPO status , Patient's Chart, lab work & pertinent test results, reviewed documented beta blocker date and time  ? ?History of Anesthesia Complications ?(+) PONV and history of anesthetic complications ? ?Airway ?Mallampati: II ? ?TM Distance: >3 FB ?Neck ROM: full ? ? ? Dental ?no notable dental hx. ? ?  ?Pulmonary ?sleep apnea ,  ?  ?Pulmonary exam normal ?breath sounds clear to auscultation ? ? ? ? ? ? Cardiovascular ?Exercise Tolerance: Good ?hypertension, negative cardio ROS ? ? ?Rhythm:regular Rate:Normal ? ? ?  ?Neuro/Psych ?PSYCHIATRIC DISORDERS Anxiety Depression  Neuromuscular disease   ? GI/Hepatic ?Neg liver ROS, hiatal hernia, PUD, GERD  Medicated,  ?Endo/Other  ?negative endocrine ROS ? Renal/GU ?CRFRenal disease  ?negative genitourinary ?  ?Musculoskeletal ? ? Abdominal ?  ?Peds ? Hematology ?negative hematology ROS ?(+)   ?Anesthesia Other Findings ? ? Reproductive/Obstetrics ?negative OB ROS ? ?  ? ? ? ? ? ? ? ? ? ? ? ? ? ?  ?  ? ? ? ? ? ? ? ? ?Anesthesia Physical ?Anesthesia Plan ? ?ASA: 3 ? ?Anesthesia Plan: MAC  ? ?Post-op Pain Management:   ? ?Induction:  ? ?PONV Risk Score and Plan:  ? ?Airway Management Planned:  ? ?Additional Equipment:  ? ?Intra-op Plan:  ? ?Post-operative Plan:  ? ?Informed Consent: I have reviewed the patients History and Physical, chart, labs and discussed the procedure including the risks, benefits and alternatives for the proposed anesthesia with the patient or authorized representative who has indicated his/her understanding and acceptance.  ? ? ? ?Dental Advisory Given ? ?Plan Discussed with: CRNA ? ?Anesthesia Plan Comments:   ? ? ? ? ? ? ?Anesthesia Quick Evaluation ? ?

## 2022-01-05 DIAGNOSIS — H25811 Combined forms of age-related cataract, right eye: Secondary | ICD-10-CM | POA: Diagnosis not present

## 2022-01-07 ENCOUNTER — Telehealth: Payer: Self-pay | Admitting: Internal Medicine

## 2022-01-07 ENCOUNTER — Other Ambulatory Visit: Payer: Self-pay

## 2022-01-07 ENCOUNTER — Encounter (HOSPITAL_COMMUNITY)
Admission: RE | Admit: 2022-01-07 | Discharge: 2022-01-07 | Disposition: A | Discharge status: Home / Self Care | Payer: Medicare HMO | Source: Ambulatory Visit | Attending: Ophthalmology | Admitting: Ophthalmology

## 2022-01-07 ENCOUNTER — Encounter (HOSPITAL_COMMUNITY): Payer: Self-pay

## 2022-01-07 NOTE — Telephone Encounter (Signed)
I spoke to a pharmacy tech at [facility] and was informed the patient was giving a 90 day supply of Spironolactone (Aldactone) 25 MG on 12/12/21. And insurance will not cover patients medication until another 3 months from 12/12/21. I also spoke with the patient and I explained to her what I was told by the pharmacy tech. Patient acknowledged my statement and understood. ?

## 2022-01-07 NOTE — Telephone Encounter (Signed)
?*  STAT* If patient is at the pharmacy, call can be transferred to refill team. ? ? ?1. Which medications need to be refilled? (please list name of each medication and dose if known)  ?spironolactone (ALDACTONE) 25 MG tablet ? ?2. Which pharmacy/location (including street and city if local pharmacy) is medication to be sent to? ?[facility], [place] - [place] ? ?3. Do they need a 30 day or 90 day supply? 90 with refills  ? ?Patient is scheduled to see Dr. Margaretann Loveless 02/04/22 ?

## 2022-01-07 NOTE — H&P (Signed)
Surgical History & Physical ? ?Patient Name: Linda [name] DOB: [birth year] ? ?Surgery: Cataract extraction with intraocular lens implant phacoemulsification; Right Eye ? ?Surgeon: Baruch Goldmann MD ?Surgery Date:  01-12-22 ?Pre-Op Date:  01-05-22 ? ?HPI: ?A 43 Yr. old female patient The patient is returning after cataract surgery. The left eye is affected. Status post cataract surgery, which began 1 weeks ago: Since the last visit, the affected area is doing well. The patient's vision is improved. Patient is following medication instructions. The patient is also returning for a cataract follow-up of the right eye. Since the last visit, the affected area is worsening. The patient's vision is blurry. The complaint is associated witH difficulty reading small print on medicine bottles, experiencing glare on bright sunny days, and difficulty seeing steps/stairs. This is negatively affecting the patient's quality of life and the patient is unable to function adequately in life with the current level of vision. HPI was performed by Baruch Goldmann . ? ?Medical History: ?Dry Eyes ?Macula Degeneration ?Cataracts ?Macula Degeneration ?Glaucoma ?Arthritis ?High Blood Pressure ?LDL ? ?Review of Systems ?Negative Allergic/Immunologic ?Negative Cardiovascular ?Negative Constitutional ?Negative Ear, Nose, Mouth & Throat ?Negative Endocrine ?Negative Eyes ?Negative Gastrointestinal ?Negative Genitourinary ?Negative Hemotologic/Lymphatic ?Negative Integumentary ?Negative Musculoskeletal ?Negative Neurological ?Negative Psychiatry ?Negative Respiratory ? ?Social ?  Never smoked  ? ?Medication ?Prednisolone-Moxifloxacin-Bromfenac,  ?Rosuvastatin, Dexilant, Benazepril, Amlodipine, Alprazolam, Lamictal, Desvenlafaxine Succinate, Spironolactone, Propranolol, Ondansetron, Acid reducer, Multivitamin, Hair Vitamins,  ? ?Sx/Procedures ?Phaco c IOL OS with Dextenza,  ? ?Drug Allergies  ?Abilify, Amoxicillin,  ? ?History & Physical: ?Heent:  Cataract, Right Eye ?NECK: supple without bruits ?LUNGS: lungs clear to auscultation ?CV: regular rate and rhythm ?Abdomen: soft and non-tender ? ?Impression & Plan: ?Assessment: ?1.  CATARACT EXTRACTION STATUS; Left Eye [phone]) ?2.  COMBINED FORMS AGE RELATED CATARACT; Both Eyes (H25.813) ? ?Plan: 1.  1 week after cataract surgery. Doing well with improved vision and normal eye pressure. Call with any problems or concerns. ?Stop drops - Dextenza. Call with any concerning symptoms. ? ?2.  Cataract accounts for the patient's decreased vision. This visual impairment is not correctable with a tolerable change in glasses or contact lenses. Cataract surgery with an implantation of a new lens should significantly improve the visual and functional status of the patient. Discussed all risks, benefits, alternatives, and potential complications. Discussed the procedures and recovery. Patient desires to have surgery. A-scan ordered and performed today for intra-ocular lens calculations. The surgery will be performed in order to improve vision for driving, reading, and for eye examinations. Recommend phacoemulsification with intra-ocular lens. Recommend Dextenza for post-operative pain and inflammation. ?Right Eye. ?Surgery required to correct imbalance of vision. ?Dilates well - shugarcaine by protocol. ?

## 2022-01-12 ENCOUNTER — Encounter (HOSPITAL_COMMUNITY)
Admission: RE | Disposition: A | Discharge status: Home / Self Care | Payer: Self-pay | Source: Home / Self Care | Attending: Ophthalmology

## 2022-01-12 ENCOUNTER — Encounter (HOSPITAL_COMMUNITY): Payer: Self-pay | Admitting: Ophthalmology

## 2022-01-12 ENCOUNTER — Ambulatory Visit (HOSPITAL_BASED_OUTPATIENT_CLINIC_OR_DEPARTMENT_OTHER): Payer: Medicare HMO | Admitting: Certified Registered"

## 2022-01-12 ENCOUNTER — Ambulatory Visit (HOSPITAL_COMMUNITY)
Admission: RE | Admit: 2022-01-12 | Discharge: 2022-01-12 | Disposition: A | Discharge status: Home / Self Care | Payer: Medicare HMO | Attending: Ophthalmology | Admitting: Ophthalmology

## 2022-01-12 ENCOUNTER — Ambulatory Visit (HOSPITAL_COMMUNITY): Payer: Medicare HMO | Admitting: Certified Registered"

## 2022-01-12 DIAGNOSIS — I129 Hypertensive chronic kidney disease with stage 1 through stage 4 chronic kidney disease, or unspecified chronic kidney disease: Secondary | ICD-10-CM | POA: Diagnosis not present

## 2022-01-12 DIAGNOSIS — I10 Essential (primary) hypertension: Secondary | ICD-10-CM | POA: Insufficient documentation

## 2022-01-12 DIAGNOSIS — H5711 Ocular pain, right eye: Secondary | ICD-10-CM | POA: Insufficient documentation

## 2022-01-12 DIAGNOSIS — Z79899 Other long term (current) drug therapy: Secondary | ICD-10-CM | POA: Insufficient documentation

## 2022-01-12 DIAGNOSIS — F419 Anxiety disorder, unspecified: Secondary | ICD-10-CM | POA: Diagnosis not present

## 2022-01-12 DIAGNOSIS — K449 Diaphragmatic hernia without obstruction or gangrene: Secondary | ICD-10-CM | POA: Insufficient documentation

## 2022-01-12 DIAGNOSIS — G4730 Sleep apnea, unspecified: Secondary | ICD-10-CM | POA: Diagnosis not present

## 2022-01-12 DIAGNOSIS — N189 Chronic kidney disease, unspecified: Secondary | ICD-10-CM

## 2022-01-12 DIAGNOSIS — H25811 Combined forms of age-related cataract, right eye: Secondary | ICD-10-CM | POA: Insufficient documentation

## 2022-01-12 DIAGNOSIS — Z9842 Cataract extraction status, left eye: Secondary | ICD-10-CM | POA: Insufficient documentation

## 2022-01-12 DIAGNOSIS — K219 Gastro-esophageal reflux disease without esophagitis: Secondary | ICD-10-CM | POA: Diagnosis not present

## 2022-01-12 SURGERY — CATARACT EXTRACTION PHACO AND INTRAOCULAR LENS PLACEMENT (IOC) with placement of Corticosteroid
Anesthesia: Monitor Anesthesia Care | Site: Eye | Laterality: Right

## 2022-01-12 MED ORDER — ORAL CARE MOUTH RINSE: 15.0000 mL | Freq: Once | OROMUCOSAL | Status: DC

## 2022-01-12 MED ORDER — STERILE WATER FOR IRRIGATION IR SOLN
Status: DC | PRN
  Administered 2022-01-12: 250 mL

## 2022-01-12 MED ORDER — SODIUM HYALURONATE 10 MG/ML IO SOLUTION
PREFILLED_SYRINGE | INTRAOCULAR | Status: DC | PRN
  Administered 2022-01-12: 0.85 mL via INTRAOCULAR

## 2022-01-12 MED ORDER — SODIUM HYALURONATE 23MG/ML IO SOSY
PREFILLED_SYRINGE | INTRAOCULAR | Status: DC | PRN
  Administered 2022-01-12: 0.6 mL via INTRAOCULAR

## 2022-01-12 MED ORDER — TETRACAINE HCL 0.5 % OP SOLN
1.0000 [drp] | OPHTHALMIC | Status: AC | PRN
  Administered 2022-01-12 (×3): 1 [drp] via OPHTHALMIC

## 2022-01-12 MED ORDER — LIDOCAINE HCL (PF) 1 % IJ SOLN
INTRAMUSCULAR | Status: DC | PRN
  Administered 2022-01-12: 1 mL via OPHTHALMIC

## 2022-01-12 MED ORDER — CHLORHEXIDINE GLUCONATE 0.12 % MT SOLN: 15.0000 mL | Freq: Once | OROMUCOSAL | Status: DC

## 2022-01-12 MED ORDER — TROPICAMIDE 1 % OP SOLN
1.0000 [drp] | OPHTHALMIC | Status: AC | PRN
  Administered 2022-01-12 (×3): 1 [drp] via OPHTHALMIC

## 2022-01-12 MED ORDER — DEXAMETHASONE 0.4 MG OP INST
VAGINAL_INSERT | OPHTHALMIC | Status: AC
  Filled 2022-01-12: qty 1

## 2022-01-12 MED ORDER — EPINEPHRINE PF 1 MG/ML IJ SOLN
INTRAMUSCULAR | Status: AC
  Filled 2022-01-12: qty 2

## 2022-01-12 MED ORDER — POVIDONE-IODINE 5 % OP SOLN
OPHTHALMIC | Status: DC | PRN
  Administered 2022-01-12: 1 via OPHTHALMIC

## 2022-01-12 MED ORDER — EPINEPHRINE PF 1 MG/ML IJ SOLN
INTRAOCULAR | Status: DC | PRN
  Administered 2022-01-12: 500 mL

## 2022-01-12 MED ORDER — BSS IO SOLN
INTRAOCULAR | Status: DC | PRN
  Administered 2022-01-12: 15 mL via INTRAOCULAR

## 2022-01-12 MED ORDER — PHENYLEPHRINE HCL 2.5 % OP SOLN
1.0000 [drp] | OPHTHALMIC | Status: AC | PRN
  Administered 2022-01-12 (×3): 1 [drp] via OPHTHALMIC

## 2022-01-12 MED ORDER — LIDOCAINE HCL 3.5 % OP GEL
1.0000 "application " | Freq: Once | OPHTHALMIC | Status: AC
  Administered 2022-01-12: 1 via OPHTHALMIC

## 2022-01-12 MED ORDER — DEXAMETHASONE 0.4 MG OP INST
VAGINAL_INSERT | OPHTHALMIC | Status: DC | PRN
Start: 2022-01-12 — End: 2022-01-12
  Administered 2022-01-12: 0.4 mg via OPHTHALMIC

## 2022-01-12 SURGICAL SUPPLY — 17 items
CATARACT SUITE SIGHTPATH (MISCELLANEOUS) ×2 IMPLANT
CLOTH BEACON ORANGE TIMEOUT ST (SAFETY) ×2 IMPLANT
EYE SHIELD UNIVERSAL CLEAR (GAUZE/BANDAGES/DRESSINGS) ×1 IMPLANT
FEE CATARACT SUITE SIGHTPATH (MISCELLANEOUS) ×1 IMPLANT
GLOVE SURG UNDER POLY LF SZ6.5 (GLOVE) IMPLANT
GLOVE SURG UNDER POLY LF SZ7 (GLOVE) ×1 IMPLANT
LENS IOL RAYNER 17.5 (Intraocular Lens) ×2 IMPLANT
LENS IOL RAYONE EMV 17.5 (Intraocular Lens) IMPLANT
NDL HYPO 18GX1.5 BLUNT FILL (NEEDLE) ×1 IMPLANT
NEEDLE HYPO 18GX1.5 BLUNT FILL (NEEDLE) ×2 IMPLANT
PAD ARMBOARD 7.5X6 YLW CONV (MISCELLANEOUS) ×2 IMPLANT
RING MALYGIN (MISCELLANEOUS) IMPLANT
RING MALYGIN 7.0 (MISCELLANEOUS) IMPLANT
SYR TB 1ML LL NO SAFETY (SYRINGE) ×2 IMPLANT
TAPE SURG TRANSPORE 1 IN (GAUZE/BANDAGES/DRESSINGS) IMPLANT
TAPE SURGICAL TRANSPORE 1 IN (GAUZE/BANDAGES/DRESSINGS) ×2
WATER STERILE IRR 250ML POUR (IV SOLUTION) ×2 IMPLANT

## 2022-01-12 NOTE — Anesthesia Preprocedure Evaluation (Signed)
Anesthesia Evaluation  ?Patient identified by MRN, date of birth, ID band ?Patient awake ? ? ? ?Reviewed: ?Allergy & Precautions, H&P , NPO status , Patient's Chart, lab work & pertinent test results, reviewed documented beta blocker date and time  ? ?History of Anesthesia Complications ?(+) PONV and history of anesthetic complications ? ?Airway ?Mallampati: II ? ?TM Distance: >3 FB ?Neck ROM: full ? ? ? Dental ?no notable dental hx. ? ?  ?Pulmonary ?sleep apnea ,  ?  ?Pulmonary exam normal ?breath sounds clear to auscultation ? ? ? ? ? ? Cardiovascular ?Exercise Tolerance: Good ?hypertension, negative cardio ROS ? ? ?Rhythm:regular Rate:Normal ? ? ?  ?Neuro/Psych ?Anxiety Depression negative neurological ROS ? negative psych ROS  ? GI/Hepatic ?Neg liver ROS, hiatal hernia, PUD, GERD  Medicated,  ?Endo/Other  ?negative endocrine ROS ? Renal/GU ?CRFRenal disease  ?negative genitourinary ?  ?Musculoskeletal ? ? Abdominal ?  ?Peds ? Hematology ?negative hematology ROS ?(+)   ?Anesthesia Other Findings ? ? Reproductive/Obstetrics ?negative OB ROS ? ?  ? ? ? ? ? ? ? ? ? ? ? ? ? ?  ?  ? ? ? ? ? ? ? ? ?Anesthesia Physical ? ?Anesthesia Plan ? ?ASA: 2 ? ?Anesthesia Plan: MAC  ? ?Post-op Pain Management:   ? ?Induction:  ? ?PONV Risk Score and Plan:  ? ?Airway Management Planned:  ? ?Additional Equipment:  ? ?Intra-op Plan:  ? ?Post-operative Plan:  ? ?Informed Consent: I have reviewed the patients History and Physical, chart, labs and discussed the procedure including the risks, benefits and alternatives for the proposed anesthesia with the patient or authorized representative who has indicated his/her understanding and acceptance.  ? ? ? ?Dental Advisory Given ? ?Plan Discussed with: CRNA ? ?Anesthesia Plan Comments:   ? ? ? ? ? ? ?Anesthesia Quick Evaluation ? ?

## 2022-01-12 NOTE — Anesthesia Procedure Notes (Addendum)
Procedure Name: [place] ?Date/Time: 01/12/2022 8:06 AM ?Performed by: Orlie Dakin, CRNA ?Pre-anesthesia Checklist: Patient identified, Emergency Drugs available, Suction available and Patient being monitored ?Patient Re-evaluated:Patient Re-evaluated prior to induction ?Oxygen Delivery Method: Nasal cannula ?Placement Confirmation: positive ETCO2 ? ? ? ? ?

## 2022-01-12 NOTE — Op Note (Signed)
Date of procedure: 01/12/22 ? ?Pre-operative diagnosis:  Visually significant combined form age-related cataract, Right Eye (H25.811) ? ?Post-operative diagnosis:   ?1. Visually significant combined form age-related cataract, Right Eye (H25.811) ?2. Pain and inflammation following cataract surgery Right Eye (H57.11) ? ?Procedure:  ?Removal of cataract via phacoemulsification and insertion of intra-ocular lens Rayner RAO200E +17.5D into the capsular bag of the Right Eye ?2. Placement of Dextenza insert, Right Eye ? ?Attending surgeon: Gerda Diss. Marisa Hua, MD, MA ? ?Anesthesia: MAC, Topical Akten ? ?Complications: None ? ?Estimated Blood Loss: <94m (minimal) ? ?Specimens: None ? ?Implants: As above ? ?Indications:  Visually significant age-related cataract, Right Eye ? ?Procedure:  ?The patient was seen and identified in the pre-operative area. The operative eye was identified and dilated.  The operative eye was marked.  Topical anesthesia was administered to the operative eye.    ? ?The patient was then to the operative suite and placed in the supine position.  A timeout was performed confirming the patient, procedure to be performed, and all other relevant information.   The patient's face was prepped and draped in the usual fashion for intra-ocular surgery.  A lid speculum was placed into the operative eye and the surgical microscope moved into place and focused.  A superotemporal paracentesis was created using a 20 gauge paracentesis blade.  Shugarcaine was injected into the anterior chamber.  Viscoelastic was injected into the anterior chamber.  A temporal clear-corneal main wound incision was created using a 2.454mmicrokeratome.  A continuous curvilinear capsulorrhexis was initiated using an irrigating cystitome and completed using capsulorrhexis forceps.  Hydrodissection and hydrodeliniation were performed.  Viscoelastic was injected into the anterior chamber.  A phacoemulsification handpiece and a chopper as a  second instrument were used to remove the nucleus and epinucleus. The irrigation/aspiration handpiece was used to remove any remaining cortical material.  ? ?The capsular bag was reinflated with viscoelastic, checked, and found to be intact.  The intraocular lens was inserted into the capsular bag.  The irrigation/aspiration handpiece was used to remove any remaining viscoelastic.  The clear corneal wound and paracentesis wounds were then hydrated and checked with Weck-Cels to be watertight.   ? ?The lid-speculum was removed. The lower punctum was dilated. A Dextenza implant was placed in the lower canaliculus without complication. ? ?The drape was removed.  The patient's face was cleaned with a wet and dry 4x4. A clear shield was taped over the eye. The patient was taken to the post-operative care unit in good condition, having tolerated the procedure well. ? ?Post-Op Instructions: [facility] and will receive all other orders and instructions. ? ?

## 2022-01-12 NOTE — Discharge Instructions (Signed)
Please discharge patient when stable, will follow up today with Dr. Jaelynne Hockley at the [facility] [place] office immediately following discharge.  Leave shield in place until visit.  All paperwork with discharge instructions will be given at the office.  [facility] Address:  [address]  [place], [place] [address]  

## 2022-01-12 NOTE — Anesthesia Postprocedure Evaluation (Signed)
Anesthesia Post Note ? ?Patient: Linda [name] ? ?Procedure(s) Performed: CATARACT EXTRACTION PHACO AND INTRAOCULAR LENS PLACEMENT (IOC) with placement of Corticosteroid (Right: Eye) ? ?Patient location during evaluation: Phase II ?Anesthesia Type: MAC ?Level of consciousness: awake ?Pain management: pain level controlled ?Vital Signs Assessment: post-procedure vital signs reviewed and stable ?Respiratory status: spontaneous breathing and respiratory function stable ?Cardiovascular status: blood pressure returned to baseline and stable ?Postop Assessment: no headache and no apparent nausea or vomiting ?Anesthetic complications: no ?Comments: Late entry ? ? ?No notable events documented. ? ? ?Last Vitals:  ?Vitals:  ? 01/12/22 0752 01/12/22 0826  ?BP: (!) 126/55 (!) 134/52  ?Pulse: 73 65  ?Resp: 17 18  ?Temp: 36.7 ?C 36.9 ?C  ?SpO2: 97% 95%  ?  ?Last Pain:  ?Vitals:  ? 01/12/22 0826  ?TempSrc: Oral  ?PainSc: 0-No pain  ? ? ?  ?  ?  ?  ?  ?  ? ?Louann Sjogren ? ? ? ? ?

## 2022-01-12 NOTE — Interval H&P Note (Signed)
History and Physical Interval Note: ? ?01/12/2022 ?7:54 AM ? ?Linda [name]  has presented today for surgery, with the diagnosis of combined forms age related cataract; right.  The various methods of treatment have been discussed with the patient and family. After consideration of risks, benefits and other options for treatment, the patient has consented to  Procedure(s) with comments: ?CATARACT EXTRACTION PHACO AND INTRAOCULAR LENS PLACEMENT (IOC) with placement of Corticosteroid (Right) - CDE:  as a surgical intervention.  The patient's history has been reviewed, patient examined, no change in status, stable for surgery.  I have reviewed the patient's chart and labs.  Questions were answered to the patient's satisfaction.   ? ? ?Baruch Goldmann ? ? ?

## 2022-01-12 NOTE — Transfer of Care (Signed)
Immediate Anesthesia Transfer of Care Note ? ?Patient: Linda [name] ? ?Procedure(s) Performed: CATARACT EXTRACTION PHACO AND INTRAOCULAR LENS PLACEMENT (IOC) with placement of Corticosteroid (Right: Eye) ? ?Patient Location: Short Stay ? ?Anesthesia Type:MAC ? ?Level of Consciousness: awake, alert  and oriented ? ?Airway & Oxygen Therapy: Patient Spontanous Breathing ? ?Post-op Assessment: Report given to RN and Post -op Vital signs reviewed and stable ? ?Post vital signs: Reviewed and stable ? ?Last Vitals:  ?Vitals Value Taken Time  ?BP    ?Temp    ?Pulse    ?Resp    ?SpO2    ? ? ?Last Pain:  ?Vitals:  ? 01/12/22 0752  ?TempSrc: Oral  ?PainSc: 0-No pain  ?   ? ?Patients Stated Pain Goal: 6 (01/12/22 8563) ? ?Complications: No notable events documented. ?

## 2022-01-19 ENCOUNTER — Ambulatory Visit: Payer: Medicare HMO | Admitting: Physician Assistant

## 2022-01-28 ENCOUNTER — Telehealth: Payer: Self-pay

## 2022-01-28 ENCOUNTER — Encounter: Payer: Self-pay | Admitting: Physician Assistant

## 2022-01-28 ENCOUNTER — Ambulatory Visit (INDEPENDENT_AMBULATORY_CARE_PROVIDER_SITE_OTHER): Payer: Medicare HMO | Admitting: Physician Assistant

## 2022-01-28 DIAGNOSIS — M1711 Unilateral primary osteoarthritis, right knee: Secondary | ICD-10-CM

## 2022-01-28 DIAGNOSIS — M170 Bilateral primary osteoarthritis of knee: Secondary | ICD-10-CM | POA: Diagnosis not present

## 2022-01-28 DIAGNOSIS — M1712 Unilateral primary osteoarthritis, left knee: Secondary | ICD-10-CM

## 2022-01-28 MED ORDER — LIDOCAINE HCL 1 % IJ SOLN
3.0000 mL | INTRAMUSCULAR | Status: AC | PRN
  Administered 2022-01-28: 3 mL

## 2022-01-28 MED ORDER — METHYLPREDNISOLONE ACETATE 40 MG/ML IJ SUSP
40.0000 mg | INTRAMUSCULAR | Status: AC | PRN
  Administered 2022-01-28: 40 mg via INTRA_ARTICULAR

## 2022-01-28 NOTE — Telephone Encounter (Signed)
Please get auth for bilateral knee gel injection. Last one was in December ?

## 2022-01-28 NOTE — Telephone Encounter (Signed)
Will submit for gel injection in May, 2023 due to next available gel injection to be completed after 03/23/2022. ?

## 2022-01-28 NOTE — Progress Notes (Signed)
? ?  Procedure Note ? ?Patient: Linda [name]             ?Date of Birth: 12-19-1940           ?MRN: [id number]             ?Visit Date: 01/28/2022 ? ? ?HPI: This Linda [name] comes in today requesting cortisone injections in both knees.  She has known osteoarthritis of both knees.  She said no new injury.  She denies any fevers chills.  Last injection was 09/01/2021.  She is also wanting to repeat the supplemental injections of both knees . ? ?Physical exam: Bilateral knees no abnormal warmth erythema or effusion.  Good range of motion of both knees. ? ? ?Procedures: ?Visit Diagnoses:  ?1. Primary osteoarthritis of right knee   ?2. Primary osteoarthritis of left knee   ? ? ?Large Joint Inj: bilateral knee on 01/28/2022 2:37 PM ?Indications: pain ?Details: 22 G 1.5 in needle, anterolateral approach ? ?Arthrogram: No ? ?Medications (Right): 3 mL lidocaine 1 %; 40 mg methylPREDNISolone acetate 40 MG/ML ?Medications (Left): 3 mL lidocaine 1 %; 40 mg methylPREDNISolone acetate 40 MG/ML ?Outcome: tolerated well, no immediate complications ?Procedure, treatment alternatives, risks and benefits explained, specific risks discussed. Consent was given by the patient. Immediately prior to procedure a time out was called to verify the correct patient, procedure, equipment, support staff and site/side marked as required. Patient was prepped and draped in the usual sterile fashion.  ? ? ?Plan: ?We will have her back once supplemental injections are available.  She tolerated the cortisone injections well today.  Questions encouraged and answered ? ? ?

## 2022-02-04 ENCOUNTER — Ambulatory Visit: Payer: Medicare HMO | Admitting: Internal Medicine

## 2022-02-04 ENCOUNTER — Encounter: Payer: Self-pay | Admitting: Internal Medicine

## 2022-02-04 VITALS — BP 124/68 | HR 58 | Ht 61.0 in | Wt 171.8 lb

## 2022-02-04 DIAGNOSIS — E785 Hyperlipidemia, unspecified: Secondary | ICD-10-CM | POA: Diagnosis not present

## 2022-02-04 DIAGNOSIS — R079 Chest pain, unspecified: Secondary | ICD-10-CM | POA: Diagnosis not present

## 2022-02-04 DIAGNOSIS — Z23 Encounter for immunization: Secondary | ICD-10-CM | POA: Diagnosis not present

## 2022-02-04 DIAGNOSIS — I10 Essential (primary) hypertension: Secondary | ICD-10-CM

## 2022-02-04 DIAGNOSIS — Z8249 Family history of ischemic heart disease and other diseases of the circulatory system: Secondary | ICD-10-CM

## 2022-02-04 DIAGNOSIS — Z79899 Other long term (current) drug therapy: Secondary | ICD-10-CM | POA: Diagnosis not present

## 2022-02-04 MED ORDER — ROSUVASTATIN CALCIUM 20 MG PO TABS: 20.0000 mg | ORAL_TABLET | Freq: Every day | ORAL | 3 refills | Status: AC

## 2022-02-04 NOTE — Progress Notes (Signed)
?Cardiology Office Note:   ? ?Date:  02/04/2022  ? ?ID:  Linda [name], DOB August 06, 1941, MRN [id number] ? ?PCP:  Crist Infante, MD  ?Cardiologist:  Elouise Munroe, MD  ?Electrophysiologist:  None  ? ?Referring MD: Crist Infante, MD  ? ?Chief Complaint/Reason for Referral: ?HTN, HLD ? ?History of Present Illness:   ? ?Linda [name] is a 81 y.o. female with a history of obesity, hyperlipidemia, hypertension, GERD, OSA not requiring treatment given weight loss, and frequent falls. She presents for follow up.  ? ?At her last appointment she was doing well, walking 30 mins a day with no chest pain. Her BP was a little low in clinic, but home readings were controlled around 130/80. No changes were recommended.  ? ?Today, she is feeling well overall aside from some anxiety.  ? ?About 1-2 weeks ago she noticed one episode of chest pain lasting 5-10 minutes. The pain started in her central chest, and radiated deeply to her back. Typically these episodes are very random and sporadic, she could go months between episodes. ? ?She also endorses ongoing GI issues followed by Dr. Fuller Plan. Last night, she suddenly became nauseated after stepping out of the shower. This was after she ate dinner. She confirms nausea is sometimes associated with her acid reflux. She plans to make an appointment to see Dr. Fuller Plan soon.  I recommended to her no late night eating prior to lying down, and recommended a food journal to identify foods that may trigger her reflux more significantly. ? ?The other night she noticed some minor edema in her ankles. This improved after elevating her legs for a time. ? ?Her BP remains well controlled on her current regimen. She is compliant and tolerates her meds well. ? ?Most recent lab work shows her LDL at 108. ? ?The patient denies chest pressure, dyspnea at rest or with exertion, palpitations, PND, orthopnea. Denies cough, fever, chills. Denies vomiting. Denies syncope or presyncope. Denies dizziness or  lightheadedness. Denies snoring. ? ?In July she is scheduled to follow up with her PCP. ? ? ?Past Medical History:  ?Diagnosis Date  ? Anxiety   ? Candida esophagitis ([place])   ? Chronic kidney disease   ? DDD (degenerative disc disease)   ? Depression   ? Erosive gastritis   ? Falls   ? GERD (gastroesophageal reflux disease)   ? Hiatal hernia   ? High cholesterol   ? History of anal fissures   ? Hypertension   ? Obesity   ? Osteoarthritis   ? Panic disorder   ? PONV (postoperative nausea and vomiting)   ? Sleep apnea   ? Does not need CPAP anymore  ? ? ?Past Surgical History:  ?Procedure Laterality Date  ? ABDOMINAL HYSTERECTOMY  1987  ? LSO / menorrhagia & leiomyomata  ? APPENDECTOMY    ? BREAST EXCISIONAL BIOPSY Left 1989  ? benign  ? BREAST EXCISIONAL BIOPSY Left 07/11/2020  ? benign  ? BREAST SURGERY  1989  ? benign tumor left breast  ? CHOLECYSTECTOMY  2001  ? TUBAL LIGATION    ? ? ?Current Medications: ?Current Meds  ?Medication Sig  ? ALPRAZolam (XANAX) 0.5 MG tablet Take 0.5 mg by mouth daily.  ? amLODipine (NORVASC) 10 MG tablet Take 10 mg by mouth daily.   ? benazepril (LOTENSIN) 40 MG tablet Take 40 mg by mouth daily.  ? calcium carbonate (TUMS - DOSED IN MG ELEMENTAL CALCIUM) 500 MG chewable tablet Chew 1 tablet by  mouth daily.  ? Calcium-Vitamin D-Vitamin K (VIACTIV CALCIUM PLUS D PO) Take 2 tablets by mouth 2 (two) times daily.  ? [place] Oil-Levomenthol (FDGARD) 25-20.75 MG CAPS Take 2 capsules by mouth in the morning and at bedtime.  ? desvenlafaxine (PRISTIQ) 100 MG 24 hr tablet Take 100 mg by mouth daily.   ? famotidine (PEPCID) 20 MG tablet Take 1 tablet (20 mg total) by mouth at bedtime as needed for heartburn or indigestion.  ? lamoTRIgine (LAMICTAL) 150 MG tablet Take 300 mg by mouth daily.  ? Multiple Vitamins-Minerals (ONE-A-DAY WOMENS 50+ ADVANTAGE) TABS Take 1 tablet by mouth daily.  ? omeprazole (PRILOSEC) 40 MG capsule Take 1 capsule (40 mg total) by mouth daily.  ? ondansetron (ZOFRAN) 8  MG tablet Take 1 tablet (8 mg total) by mouth every 8 (eight) hours as needed for nausea.  ? propranolol (INDERAL) 10 MG tablet Take 10 mg by mouth 2 (two) times daily.  ? rosuvastatin (CRESTOR) 10 MG tablet   ? spironolactone (ALDACTONE) 25 MG tablet Take 1 tablet (25 mg total) by mouth daily.  ? triamcinolone cream (KENALOG) 0.1 % apply to the itching areas TID prn  ?  ? ?Allergies:   Amoxicillin, Cefdinir, Other, Penicillin g, and Abilify [aripiprazole]  ? ?Social History  ? ?Tobacco Use  ? Smoking status: Never  ? Smokeless tobacco: Never  ?Vaping Use  ? Vaping Use: Never used  ?Substance Use Topics  ? Alcohol use: No  ? Drug use: No  ?  ? ?Family History: ?The patient's family history includes Diabetes in her brother and sister; Heart attack in her brother, brother, and brother; Heart disease in her brother, brother, brother, and mother; Hypertension in her sister; Lung cancer in her father; Ovarian cancer in her sister. There is no history of Colon cancer. ? ?ROS:   ?Please see the history of present illness.    ?(+) Chest pain radiating to back ?(+) Nausea ?(+) Acid reflux ?(+) Bilateral ankle edema ?(+) Anxiety ?All other systems reviewed and are negative. ? ?EKGs/Labs/Other Studies Reviewed:   ? ?The following studies were reviewed today: ? ?Lexiscan Myoview 11/13/2019: ?Nuclear stress EF: 73%. ?The left ventricular ejection fraction is hyperdynamic (>65%). ?There was no ST segment deviation noted during stress. ?The study is normal. ?This is a low risk study. ?  ?Normal pharmacologic nuclear stress test with no evidence for prior infarct or ischemia and hyperdynamic LVEF. ? ?Echo 10/30/2019: ? 1. Left ventricular ejection fraction, by visual estimation, is 60 to  ?65%. The left ventricle has normal function. There is no left ventricular  ?hypertrophy.  ? 2. Left ventricular diastolic parameters are consistent with Grade II  ?diastolic dysfunction (pseudonormalization).  ? 3. The left ventricle has no  regional wall motion abnormalities.  ? 4. Global right ventricle has normal systolic function.The right  ?ventricular size is normal. No increase in right ventricular wall  ?thickness.  ? 5. Left atrial size was mild-moderately dilated.  ? 6. Right atrial size was normal.  ? 7. The mitral valve is grossly normal. Mild mitral valve regurgitation.  ? 8. The tricuspid valve is grossly normal.  ? 9. The aortic valve is tricuspid. Aortic valve regurgitation is mild.  ?10. The pulmonic valve was grossly normal. Pulmonic valve regurgitation is trivial.  ?11. Normal pulmonary artery systolic pressure.  ?12. The tricuspid regurgitant velocity is 2.57 m/s, and with an assumed  ?right atrial pressure of 3 mmHg, the estimated right ventricular systolic  ?pressure is normal  at 29.4 mmHg.  ?13. The inferior vena cava is normal in size with greater than 50%  ?respiratory variability, suggesting right atrial pressure of 3 mmHg.  ? ?EKG:  EKG is personally reviewed. ?02/04/2022: Sinus bradycardia. Rate 58 bpm. RBBB. ?12/26/2020: NSR, RBBB, no significant change from prior. ? ?Recent Labs: ?No results found for requested labs within last 8760 hours.  ? ?Recent Lipid Panel ?No results found for: CHOL, TRIG, HDL, CHOLHDL, VLDL, LDLCALC, LDLDIRECT ? ?Physical Exam:   ? ?VS:  BP 124/68 (BP Location: Left Arm, Patient Position: Sitting, Cuff Size: Normal)   Pulse (!) 58   Ht '5\' 1"'$  (1.549 m)   Wt 171 lb 12.8 oz (77.9 kg)   SpO2 95%   BMI 32.46 kg/m?    ? ?Wt Readings from Last 5 Encounters:  ?02/04/22 171 lb 12.8 oz (77.9 kg)  ?01/07/22 173 lb 4.5 oz (78.6 kg)  ?12/16/21 173 lb 4 oz (78.6 kg)  ?07/15/21 165 lb (74.8 kg)  ?12/26/20 152 lb 9.6 oz (69.2 kg)  ?  ?Constitutional: No acute distress ?Eyes: sclera non-icteric, normal conjunctiva and lids ?ENMT: normal dentition, moist mucous membranes ?Cardiovascular: regular rhythm, normal rate, no murmurs. S1 and S2 normal. Radial pulses normal bilaterally. No jugular venous distention.   ?Respiratory: clear to auscultation bilaterally ?GI : normal bowel sounds, soft and nontender. No distention.   ?MSK: extremities warm, well perfused. No edema.  ?NEURO: grossly nonfocal exam, moves all extremities.

## 2022-02-04 NOTE — Patient Instructions (Signed)
Medication Instructions:  ?INCREASE CRESTOR TO '20mg'$  ONCE DAILY  ?*If you need a refill on your cardiac medications before your next appointment, please call your pharmacy* ? ?Lab Work: ?Please return for Blood Work in [place] . No appointment needed, lab here at the office is open Monday-Friday from 8AM to 4PM and closed daily for lunch from 12:45-1:45.  ? ?If you have labs (blood work) drawn today and your tests are completely normal, you will receive your results only by: ?MyChart Message (if you have MyChart) OR ?A paper copy in the mail ?If you have any lab test that is abnormal or we need to change your treatment, we will call you to review the results. ? ?Follow-Up: ?At [facility], you and your health needs are our priority.  As part of our continuing mission to provide you with exceptional heart care, we have created designated Provider Care Teams.  These Care Teams include your primary Cardiologist (physician) and Advanced Practice Providers (APPs -  Physician Assistants and Nurse Practitioners) who all work together to provide you with the care you need, when you need it. ? ?Your next appointment:   ?6 month(s) ? ?The format for your next appointment:   ?In Person ? ?Provider:   ?Elouise Munroe, MD   ? ?Other Instructions ?IF ADDITIONAL EPISODES OF CHEST PAIN PLEASE CALL THE OFFICE TO SCHEDULE CARDIAC PET STRESS TEST [phone] ? ?Important Information About Sugar ? ? ? ? ?  ?

## 2022-02-09 NOTE — Addendum Note (Signed)
Addended by: Rexanne Mano B on: 02/09/2022 03:05 PM ? ? Modules accepted: Orders ? ?

## 2022-02-10 ENCOUNTER — Other Ambulatory Visit: Payer: Self-pay | Admitting: Gastroenterology

## 2022-02-10 DIAGNOSIS — K219 Gastro-esophageal reflux disease without esophagitis: Secondary | ICD-10-CM

## 2022-02-11 ENCOUNTER — Ambulatory Visit: Payer: Medicare HMO | Admitting: Physician Assistant

## 2022-02-11 ENCOUNTER — Encounter: Payer: Self-pay | Admitting: Physician Assistant

## 2022-02-11 ENCOUNTER — Ambulatory Visit (INDEPENDENT_AMBULATORY_CARE_PROVIDER_SITE_OTHER): Payer: Medicare HMO

## 2022-02-11 DIAGNOSIS — M5442 Lumbago with sciatica, left side: Secondary | ICD-10-CM

## 2022-02-11 MED ORDER — TIZANIDINE HCL 2 MG PO CAPS: 2.0000 mg | ORAL_CAPSULE | Freq: Every evening | ORAL | 0 refills | Status: DC

## 2022-02-11 MED ORDER — METHYLPREDNISOLONE 4 MG PO TABS: ORAL_TABLET | ORAL | 0 refills | Status: DC

## 2022-02-11 NOTE — Progress Notes (Signed)
? ?Office Visit Note ?  ?Patient: Linda [name]           ?Date of Birth: [birth year]           ?MRN: [id number] ?Visit Date: 02/11/2022 ?             ?Requested by: Crist Infante, MD ?[address] ?[place],  [place] [address] ?PCP: Crist Infante, MD ? ? ?Assessment & Plan: ?Visit Diagnoses:  ?1. Acute left-sided low back pain with left-sided sciatica   ? ? ?Plan: We will send her to formal physical therapy had come in at any pain.  He will work on range of motion lumbar spine, back exercises, core strengthening, home exercise program modalities.  Also placed her on a Medrol Dosepak for 6 days she will take no other NSAIDs.  She is given Zanaflex to take predominantly at night.  See her back first part of June for her knee injections we will reassess her back at that point time.  Questions were encouraged and answered at length. ? ?Follow-Up Instructions: Return in about 6 weeks (around 03/25/2022).  ? ?Orders:  ?Orders Placed This Encounter  ?Procedures  ? XR Lumbar Spine 2-3 Views  ? ?Meds ordered this encounter  ?Medications  ? methylPREDNISolone (MEDROL) 4 MG tablet  ?  Sig: Take as directed  ?  Dispense:  21 tablet  ?  Refill:  0  ? tizanidine (ZANAFLEX) 2 MG capsule  ?  Sig: Take 1 capsule (2 mg total) by mouth at bedtime.  ?  Dispense:  20 capsule  ?  Refill:  0  ? ? ? ? Procedures: ?No procedures performed ? ? ?Clinical Data: ?No additional findings. ? ? ?Subjective: ?Chief Complaint  ?Patient presents with  ? Lower Back - Pain  ? ? ?HPI ?Linda [name] comes in today with new complaint of low back pain for the past couple weeks.  She states the pain comes and goes.  Pain started on the left.  She notes that she has pain that radiates down into her left leg in the calf describes that as tingling sharp burning sensation.  Does awaken her.  States that the pain is independent of position.  Sitting lying down or standing.  She denies any bowel or bladder dysfunction denies any saddle anesthesia like symptoms.   Denies any weight loss.  Denies any fevers chills.  States back pain is 10 out of 10 pain at worst.  She has tried extra strength Tylenol and Aleve. ?Review of Systems ?See HPI otherwise negative ? ?Objective: ?Vital Signs: There were no vitals taken for this visit. ? ?Physical Exam ?Constitutional:   ?   Appearance: She is not ill-appearing or diaphoretic.  ?Pulmonary:  ?   Effort: Pulmonary effort is normal.  ?Neurological:  ?   Mental Status: She is alert and oriented to person, place, and time.  ?Psychiatric:     ?   Mood and Affect: Mood normal.  ? ? ?Ortho Exam ?Bilateral lower extremities 5 out of 5 strength throughout against resistance.  Tight hamstrings bilaterally.  Negative straight leg raise bilaterally.  Sensation grossly intact bilateral feet light touch.  Tenderness over the lower lumbar paraspinous region on the left.  She has full forward flexion limited extension lumbar spine.  Bilateral hips good range of motion.  Tenderness over the trochanteric region both hips. ?Specialty Comments:  ?No specialty comments available. ? ?Imaging: ?XR Lumbar Spine 2-3 Views ? ?Result Date: 02/11/2022 ?Lumbar spine 2 views: Shows  slight scoliosis.  Disc base overall well-maintained.  Lower lumbar facet arthritic changes.  No acute fractures.  No spondylolisthesis.  ? ? ?PMFS History: ?Patient Active Problem List  ? Diagnosis Date Noted  ? Allergic rhinitis 12/15/2021  ? Bradycardia 12/15/2021  ? Pain in right knee 02/17/2019  ? Cardiac murmur, unspecified 08/08/2018  ? Acute right-sided low back pain with right-sided sciatica 06/13/2018  ? Recurrent falls 06/10/2018  ? Recurrent major depression ([place]) 05/14/2016  ? Constipation 04/10/2015  ? Palpitations 02/15/2014  ? Hypertension   ? High cholesterol   ? Anxiety   ? Proteinuria 09/04/2010  ? Right bundle branch block 08/23/2009  ? Obstructive sleep apnea syndrome 07/05/2009  ? Gastro-esophageal reflux disease without esophagitis 07/05/2009  ? ?Past Medical  History:  ?Diagnosis Date  ? Anxiety   ? Candida esophagitis ([place])   ? Chronic kidney disease   ? DDD (degenerative disc disease)   ? Depression   ? Erosive gastritis   ? Falls   ? GERD (gastroesophageal reflux disease)   ? Hiatal hernia   ? High cholesterol   ? History of anal fissures   ? Hypertension   ? Obesity   ? Osteoarthritis   ? Panic disorder   ? PONV (postoperative nausea and vomiting)   ? Sleep apnea   ? Does not need CPAP anymore  ?  ?Family History  ?Problem Relation Age of Onset  ? Heart disease Mother   ? Lung cancer Father   ? Diabetes Sister   ? Hypertension Sister   ? Diabetes Brother   ? Heart disease Brother   ? Heart attack Brother   ? Ovarian cancer Sister   ? Heart disease Brother   ? Heart attack Brother   ? Heart disease Brother   ? Heart attack Brother   ? Colon cancer Neg Hx   ?  ?Past Surgical History:  ?Procedure Laterality Date  ? ABDOMINAL HYSTERECTOMY  1987  ? LSO / menorrhagia & leiomyomata  ? APPENDECTOMY    ? BREAST EXCISIONAL BIOPSY Left 1989  ? benign  ? BREAST EXCISIONAL BIOPSY Left 07/11/2020  ? benign  ? BREAST SURGERY  1989  ? benign tumor left breast  ? CHOLECYSTECTOMY  2001  ? TUBAL LIGATION    ? ?Social History  ? ?Occupational History  ? Occupation: Retired   ?Tobacco Use  ? Smoking status: Never  ? Smokeless tobacco: Never  ?Vaping Use  ? Vaping Use: Never used  ?Substance and Sexual Activity  ? Alcohol use: No  ? Drug use: No  ? Sexual activity: Yes  ? ? ? ? ? ? ?

## 2022-02-16 ENCOUNTER — Telehealth: Payer: Self-pay | Admitting: Internal Medicine

## 2022-02-16 DIAGNOSIS — R072 Precordial pain: Secondary | ICD-10-CM

## 2022-02-16 NOTE — Addendum Note (Signed)
Addended by: Waylan Rocher on: 02/16/2022 11:44 AM ? ? Modules accepted: Orders ? ?

## 2022-02-16 NOTE — Addendum Note (Signed)
Addended by: Rexanne Mano B on: 02/16/2022 02:23 PM ? ? Modules accepted: Orders ? ?

## 2022-02-16 NOTE — Addendum Note (Signed)
Addended by: Cherlynn Kaiser A on: 02/16/2022 02:39 PM ? ? Modules accepted: Orders ? ?

## 2022-02-16 NOTE — Telephone Encounter (Signed)
New Message: ? ? ?Patient said Dr Margaretann Loveless told her if she had any more chest pain to call and schedule Cardiac Pet Stress Test. She had some this morning around 4:00 AM. She would like to go ahead and schedule that test please. ? ? ?Pt c/o of Chest Pain: STAT if CP now or developed within 24 hours ? ?1. Are you having CP right now? Not at this time, early this morning had some ? ?2. Are you experiencing any other symptoms (ex. SOB, nausea, vomiting, sweating)? no ? ?3. How long have you been experiencing CP? Started in April ? ?4. Is your CP continuous or coming and going? comes and goes ? ?5. Have you taken Nitroglycerin? She never had any Nitroglycerin ?? ? ?

## 2022-02-16 NOTE — Telephone Encounter (Signed)
Returned call to patient who states that she had an episode of chest pain this morning that last around 5 mins. Patient denies any shortness of breath or any other symptoms during episode. Patient states that she feels it could have been heart burn related but is unsure and would like to proceed with cardiac pet stress test. Advised patient that order has been placed and went over instructions for Pet. Advised patient that someone from scheduling would be reaching out to get her scheduled. ? ?Made patient aware of ED precautions should new or worsening symptoms develop. Patient verbalized understanding.  ? ?Advised patient to call back to office with any issues, questions, or concerns. Patient verbalized understanding.  ? ?

## 2022-02-23 NOTE — Addendum Note (Signed)
Addended by: Cherlynn Kaiser A on: 02/23/2022 10:50 AM ? ? Modules accepted: Orders ? ?

## 2022-02-25 DIAGNOSIS — R110 Nausea: Secondary | ICD-10-CM | POA: Diagnosis not present

## 2022-02-25 DIAGNOSIS — Z79899 Other long term (current) drug therapy: Secondary | ICD-10-CM | POA: Diagnosis not present

## 2022-02-25 DIAGNOSIS — R109 Unspecified abdominal pain: Secondary | ICD-10-CM | POA: Diagnosis not present

## 2022-02-25 DIAGNOSIS — K219 Gastro-esophageal reflux disease without esophagitis: Secondary | ICD-10-CM | POA: Diagnosis not present

## 2022-02-25 DIAGNOSIS — N9489 Other specified conditions associated with female genital organs and menstrual cycle: Secondary | ICD-10-CM | POA: Diagnosis not present

## 2022-03-06 ENCOUNTER — Ambulatory Visit
Admission: EM | Admit: 2022-03-06 | Discharge: 2022-03-06 | Disposition: A | Discharge status: Home / Self Care | Payer: Medicare HMO | Attending: Family Medicine | Admitting: Family Medicine

## 2022-03-06 DIAGNOSIS — T148XXA Other injury of unspecified body region, initial encounter: Secondary | ICD-10-CM

## 2022-03-06 DIAGNOSIS — S60811A Abrasion of right wrist, initial encounter: Secondary | ICD-10-CM

## 2022-03-06 MED ORDER — CHLORHEXIDINE GLUCONATE 4 % EX LIQD
Freq: Every day | CUTANEOUS | 0 refills | Status: DC | PRN
Start: 2022-03-06 — End: 2023-07-27

## 2022-03-06 MED ORDER — DOXYCYCLINE HYCLATE 100 MG PO CAPS: 100.0000 mg | ORAL_CAPSULE | Freq: Two times a day (BID) | ORAL | 0 refills | Status: DC

## 2022-03-06 NOTE — ED Triage Notes (Signed)
Pt states she already had bruising on her right lower arm and she fell on Tuesday evening out to eat  Pt states they used neosporin and guaze and would like to make sure its ok

## 2022-03-06 NOTE — ED Provider Notes (Signed)
[facility]    CSN: [id number] Arrival date & time: 03/06/22  0805      History   Chief Complaint Chief Complaint  Patient presents with   Abrasion    Skin tear on the arm    HPI Linda [name] is a 81 y.o. female.   Presenting today with a large skin abrasion to the right dorsal wrist after falling on a paved driveway about a week ago.  She denies any decreased range of motion, deformity, numbness, tingling, diffuse arm swelling.  Has been cleaning the area daily, dressing with Polysporin and nonstick gauze and denies any fever, worsening redness, drainage.  States her tetanus shot is up-to-date in the last few years.   Past Medical History:  Diagnosis Date   Anxiety    Candida esophagitis ([place])    Chronic kidney disease    DDD (degenerative disc disease)    Depression    Erosive gastritis    Falls    GERD (gastroesophageal reflux disease)    Hiatal hernia    High cholesterol    History of anal fissures    Hypertension    Obesity    Osteoarthritis    Panic disorder    PONV (postoperative nausea and vomiting)    Sleep apnea    Does not need CPAP anymore    Patient Active Problem List   Diagnosis Date Noted   Allergic rhinitis 12/15/2021   Bradycardia 12/15/2021   Pain in right knee 02/17/2019   Cardiac murmur, unspecified 08/08/2018   Acute right-sided low back pain with right-sided sciatica 06/13/2018   Recurrent falls 06/10/2018   Recurrent major depression ([place]) 05/14/2016   Constipation 04/10/2015   Palpitations 02/15/2014   Hypertension    High cholesterol    Anxiety    Proteinuria 09/04/2010   Right bundle branch block 08/23/2009   Obstructive sleep apnea syndrome 07/05/2009   Gastro-esophageal reflux disease without esophagitis 07/05/2009    Past Surgical History:  Procedure Laterality Date   ABDOMINAL HYSTERECTOMY  1987   LSO / menorrhagia & leiomyomata   APPENDECTOMY     BREAST EXCISIONAL BIOPSY Left 1989   benign    BREAST EXCISIONAL BIOPSY Left 07/11/2020   benign   BREAST SURGERY  1989   benign tumor left breast   CHOLECYSTECTOMY  2001   TUBAL LIGATION      OB History     Gravida  3   Para  1   Term      Preterm      AB  2   Living  1      SAB  2   IAB      Ectopic      Multiple      Live Births               Home Medications    Prior to Admission medications   Medication Sig Start Date End Date Taking? Authorizing Provider  chlorhexidine (HIBICLENS) 4 % external liquid Apply topically daily as needed. 03/06/22  Yes Volney American, PA-C  doxycycline (VIBRAMYCIN) 100 MG capsule Take 1 capsule (100 mg total) by mouth 2 (two) times daily. 03/06/22  Yes Volney American, PA-C  ALPRAZolam Duanne Moron) 0.5 MG tablet Take 0.5 mg by mouth daily. 08/20/20   [provider]  amLODipine (NORVASC) 10 MG tablet Take 10 mg by mouth daily.  07/01/12   [provider]  benazepril (LOTENSIN) 40 MG tablet Take 40 mg by mouth  daily.    [provider]  calcium carbonate (TUMS - DOSED IN MG ELEMENTAL CALCIUM) 500 MG chewable tablet Chew 1 tablet by mouth daily.    [provider]  Calcium-Vitamin D-Vitamin K (VIACTIV CALCIUM PLUS D PO) Take 2 tablets by mouth 2 (two) times daily.    [provider]  [place] Oil-Levomenthol (FDGARD) 25-20.75 MG CAPS Take 2 capsules by mouth in the morning and at bedtime.    [provider]  desvenlafaxine (PRISTIQ) 100 MG 24 hr tablet Take 100 mg by mouth daily.  05/03/18   [provider]  famotidine (PEPCID) 20 MG tablet TAKE ONE TABLET ('20MG'$  TOTAL) BY MOUTH ATBEDTIME AS NEEDED FOR HEARTBURN OR INDIGESTION 02/10/22   Ladene Artist, MD  lamoTRIgine (LAMICTAL) 150 MG tablet Take 300 mg by mouth daily. 12/10/21   [provider]  methylPREDNISolone (MEDROL) 4 MG tablet Take as directed 02/11/22   Pete Pelt, PA-C  Multiple Vitamins-Minerals (ONE-A-DAY WOMENS 50+ ADVANTAGE) TABS  Take 1 tablet by mouth daily. 03/13/14   [provider]  omeprazole (PRILOSEC) 40 MG capsule Take 1 capsule (40 mg total) by mouth daily. 12/24/21   Ladene Artist, MD  ondansetron (ZOFRAN) 8 MG tablet Take 1 tablet (8 mg total) by mouth every 8 (eight) hours as needed for nausea. 01/07/17   Esterwood, Amy S, PA-C  propranolol (INDERAL) 10 MG tablet Take 10 mg by mouth 2 (two) times daily. 11/11/21   [provider]  rosuvastatin (CRESTOR) 20 MG tablet Take 1 tablet (20 mg total) by mouth daily. 02/04/22   Elouise Munroe, MD  spironolactone (ALDACTONE) 25 MG tablet Take 1 tablet (25 mg total) by mouth daily. 12/12/21 03/12/22  Elouise Munroe, MD  tizanidine (ZANAFLEX) 2 MG capsule Take 1 capsule (2 mg total) by mouth at bedtime. 02/11/22   Pete Pelt, PA-C  triamcinolone cream (KENALOG) 0.1 % apply to the itching areas TID prn 02/20/19   [provider]    Family History Family History  Problem Relation Age of Onset   Heart disease Mother    Lung cancer Father    Diabetes Sister    Hypertension Sister    Diabetes Brother    Heart disease Brother    Heart attack Brother    Ovarian cancer Sister    Heart disease Brother    Heart attack Brother    Heart disease Brother    Heart attack Brother    Colon cancer Neg Hx     Social History Social History   Tobacco Use   Smoking status: Never    Passive exposure: Never   Smokeless tobacco: Never  Vaping Use   Vaping Use: Never used  Substance Use Topics   Alcohol use: No   Drug use: No     Allergies   Amoxicillin, Cefdinir, Other, Penicillin g, and Abilify [aripiprazole]   Review of Systems Review of Systems Per HPI  Physical Exam Triage Vital Signs ED Triage Vitals  Enc Vitals Group     BP 03/06/22 0817 (!) 147/79     Pulse Rate 03/06/22 0817 71     Resp 03/06/22 0817 16     Temp 03/06/22 0817 98.2 F (36.8 C)     Temp Source 03/06/22 0817 Oral     SpO2 03/06/22 0817 95 %      Weight --      Height --      Head Circumference --  Peak Flow --      Pain Score 03/06/22 0814 9     Pain Loc --      Pain Edu? --      Excl. in [place]? --    No data found.  Updated Vital Signs BP (!) 147/79 (BP Location: Right Arm)   Pulse 71   Temp 98.2 F (36.8 C) (Oral)   Resp 16   SpO2 95%   Visual Acuity Right Eye Distance:   Left Eye Distance:   Bilateral Distance:    Right Eye Near:   Left Eye Near:    Bilateral Near:     Physical Exam Vitals and nursing note reviewed.  Constitutional:      Appearance: Normal appearance. She is not ill-appearing.  HENT:     Head: Atraumatic.  Eyes:     Extraocular Movements: Extraocular movements intact.     Conjunctiva/sclera: Conjunctivae normal.  Cardiovascular:     Rate and Rhythm: Normal rate and regular rhythm.     Heart sounds: Normal heart sounds.  Pulmonary:     Effort: Pulmonary effort is normal.     Breath sounds: Normal breath sounds.  Musculoskeletal:        General: Normal range of motion.     Cervical back: Normal range of motion and neck supple.  Skin:    General: Skin is warm.     Comments: One 2 cm x 2 cm area of abrasion and one slightly smaller area to the right dorsal wrist with surrounding petechiae.  No active drainage, bleeding well controlled, no surrounding erythema, edema.  No foreign body noted in wound  Neurological:     Mental Status: She is alert and oriented to person, place, and time.  Psychiatric:        Mood and Affect: Mood normal.        Thought Content: Thought content normal.        Judgment: Judgment normal.   UC Treatments / Results  Labs (all labs ordered are listed, but only abnormal results are displayed) Labs Reviewed - No data to display  EKG  Radiology No results found.  Procedures Procedures (including critical care time)  Medications Ordered in UC Medications - No data to display  Initial Impression / Assessment and Plan / UC Course  I have reviewed the  triage vital signs and the nursing notes.  Pertinent labs & imaging results that were available during my care of the patient were reviewed by me and considered in my medical decision making (see chart for details).     Wound cleaned, dressed in clinic, tetanus already up-to-date, will treat with Hibiclens, mupirocin, good home wound care and placed on a course of antibiotics to check from infection.  Return precautions reviewed  Final Clinical Impressions(s) / UC Diagnoses   Final diagnoses:  Skin abrasion   Discharge Instructions   None    ED Prescriptions     Medication Sig Dispense Auth. Provider   chlorhexidine (HIBICLENS) 4 % external liquid Apply topically daily as needed. 120 mL Volney American, [place]   doxycycline (VIBRAMYCIN) 100 MG capsule Take 1 capsule (100 mg total) by mouth 2 (two) times daily. 14 capsule Volney American, [place]      PDMP not reviewed this encounter.   Merrie Roof [place], [place] 03/06/22 [phone]

## 2022-03-17 ENCOUNTER — Telehealth: Payer: Self-pay

## 2022-03-17 NOTE — Telephone Encounter (Signed)
VOB submitted for Monovisc, bilateral knee. BV pending 

## 2022-03-24 ENCOUNTER — Other Ambulatory Visit: Payer: Self-pay

## 2022-03-24 DIAGNOSIS — M1712 Unilateral primary osteoarthritis, left knee: Secondary | ICD-10-CM

## 2022-03-24 DIAGNOSIS — M1711 Unilateral primary osteoarthritis, right knee: Secondary | ICD-10-CM

## 2022-03-26 ENCOUNTER — Other Ambulatory Visit: Payer: Self-pay | Admitting: Internal Medicine

## 2022-03-26 DIAGNOSIS — I10 Essential (primary) hypertension: Secondary | ICD-10-CM

## 2022-03-26 DIAGNOSIS — N83291 Other ovarian cyst, right side: Secondary | ICD-10-CM

## 2022-03-31 ENCOUNTER — Telehealth: Payer: Self-pay | Admitting: Internal Medicine

## 2022-03-31 NOTE — Telephone Encounter (Signed)
*  STAT* If patient is at the pharmacy, call can be transferred to refill team.   1. Which medications need to be refilled? (please list name of each medication and dose if known)   spironolactone (ALDACTONE) 25 MG tablet (Expired   2. Which pharmacy/location (including street and city if local pharmacy) is medication to be sent to? [place]  3. Do they need a 30 day or 90 day supply?  90 day

## 2022-03-31 NOTE — Telephone Encounter (Addendum)
Attempted to call pt. No voicemail.   Prescription was sent in.

## 2022-03-31 NOTE — Telephone Encounter (Signed)
*  STAT* If patient is at the pharmacy, call can be transferred to refill team.   1. Which medications need to be refilled? (please list name of each medication and dose if known)   spironolactone (ALDACTONE) 25 MG tablet (Expired    2. Which pharmacy/location (including street and city if local pharmacy) is medication to be sent to?  [place] 3. Do they need a 30 day or 90 day supply?  90 day

## 2022-04-02 ENCOUNTER — Encounter: Payer: Self-pay | Admitting: Physician Assistant

## 2022-04-02 ENCOUNTER — Ambulatory Visit: Payer: Medicare HMO | Admitting: Physician Assistant

## 2022-04-02 DIAGNOSIS — M1712 Unilateral primary osteoarthritis, left knee: Secondary | ICD-10-CM

## 2022-04-02 DIAGNOSIS — M170 Bilateral primary osteoarthritis of knee: Secondary | ICD-10-CM

## 2022-04-02 DIAGNOSIS — M1711 Unilateral primary osteoarthritis, right knee: Secondary | ICD-10-CM

## 2022-04-02 MED ORDER — HYALURONAN 88 MG/4ML IX SOSY
88.0000 mg | PREFILLED_SYRINGE | INTRA_ARTICULAR | Status: AC | PRN
  Administered 2022-04-02: 88 mg via INTRA_ARTICULAR

## 2022-04-02 NOTE — Progress Notes (Signed)
   Procedure Note  Patient: Linda [name]             Date of Birth: Oct 08, 1941           MRN: [id number]             Visit Date: 04/02/2022 HPI: Mrs. Linda [name] comes in today for scheduled bilateral Monovisc injections.  Last gel injections were 09/22/2021 she got good relief.  She has had no new injury to either knee.  She has no scheduled surgery for either knee.  She has known tricompartmental arthritis of the left and right knee.  Physical exam: Bilateral knees no abnormal warmth erythema or effusion.  Bilateral knees good range of motion.  Ligamentously stable. Procedures: Visit Diagnoses:  1. Primary osteoarthritis of right knee   2. Primary osteoarthritis of left knee     Large Joint Inj: bilateral knee on 04/02/2022 4:39 PM Indications: pain Details: 22 G 1.5 in needle, anterolateral approach  Arthrogram: No  Medications (Right): 88 mg Hyaluronan 88 MG/4ML Medications (Left): 88 mg Hyaluronan 88 MG/4ML Outcome: tolerated well, no immediate complications Procedure, treatment alternatives, risks and benefits explained, specific risks discussed. Consent was given by the patient. Immediately prior to procedure a time out was called to verify the correct patient, procedure, equipment, support staff and site/side marked as required. Patient was prepped and draped in the usual sterile fashion.    Plan: She needs to wait least 6 months between supplemental injections.  Questions were encouraged and answered.  Follow-up as needed

## 2022-04-03 ENCOUNTER — Ambulatory Visit
Admission: RE | Admit: 2022-04-03 | Discharge: 2022-04-03 | Disposition: A | Discharge status: Home / Self Care | Payer: Medicare HMO | Source: Ambulatory Visit | Attending: Internal Medicine | Admitting: Internal Medicine

## 2022-04-03 DIAGNOSIS — N83291 Other ovarian cyst, right side: Secondary | ICD-10-CM

## 2022-04-28 ENCOUNTER — Other Ambulatory Visit: Payer: Self-pay

## 2022-04-28 DIAGNOSIS — Z79899 Other long term (current) drug therapy: Secondary | ICD-10-CM | POA: Diagnosis not present

## 2022-04-28 DIAGNOSIS — E785 Hyperlipidemia, unspecified: Secondary | ICD-10-CM

## 2022-04-29 LAB — HEPATIC FUNCTION PANEL
ALT: 15 IU/L (ref 0–32)
AST: 22 IU/L (ref 0–40)
Albumin: 4.7 g/dL (ref 3.8–4.8)
Alkaline Phosphatase: 81 IU/L (ref 44–121)
Bilirubin Total: 0.4 mg/dL (ref 0.0–1.2)
Bilirubin, Direct: 0.1 mg/dL (ref 0.00–0.40)
Total Protein: 6.8 g/dL (ref 6.0–8.5)

## 2022-04-29 LAB — LIPID PANEL
Chol/HDL Ratio: 3.1 ratio (ref 0.0–4.4)
Cholesterol, Total: 196 mg/dL (ref 100–199)
HDL: 63 mg/dL (ref >39)
LDL Chol Calc (NIH): 98 mg/dL (ref 0–99)
Triglycerides: 207 mg/dL — ABNORMAL HIGH (ref 0–149)
VLDL Cholesterol Cal: 35 mg/dL (ref 5–40)

## 2022-05-11 ENCOUNTER — Telehealth (HOSPITAL_COMMUNITY): Payer: Self-pay | Admitting: Emergency Medicine

## 2022-05-11 NOTE — Telephone Encounter (Signed)
Reaching out to patient to offer assistance regarding upcoming cardiac imaging study; pt verbalizes understanding of appt date/time, parking situation and where to check in, pre-test NPO status and medications ordered, and verified current allergies; name and call back number provided for further questions should they arise Marchia Bond RN [facility] [phone] office [phone] cell  Pt states she does not take propanolol Denies iv issues Arrival 830

## 2022-05-12 ENCOUNTER — Encounter (HOSPITAL_COMMUNITY): Payer: Medicare HMO

## 2022-05-22 ENCOUNTER — Telehealth (HOSPITAL_COMMUNITY): Payer: Self-pay | Admitting: Emergency Medicine

## 2022-05-22 NOTE — Telephone Encounter (Signed)
Unable to leave vm Anjanae Woehrle RN Navigator Cardiac Imaging [facility] [phone] Office  [phone] Cell  

## 2022-05-26 ENCOUNTER — Ambulatory Visit (HOSPITAL_COMMUNITY)
Admission: RE | Admit: 2022-05-26 | Discharge: 2022-05-26 | Disposition: A | Discharge status: Home / Self Care | Payer: Medicare HMO | Source: Ambulatory Visit | Attending: Internal Medicine | Admitting: Internal Medicine

## 2022-05-26 DIAGNOSIS — R072 Precordial pain: Secondary | ICD-10-CM | POA: Insufficient documentation

## 2022-05-26 MED ORDER — REGADENOSON 0.4 MG/5ML IV SOLN: 0.4000 mg | Freq: Once | INTRAVENOUS | Status: AC

## 2022-05-26 MED ORDER — RUBIDIUM RB82 GENERATOR (RUBYFILL)
20.0000 | PACK | Freq: Once | INTRAVENOUS | Status: AC
  Administered 2022-05-26: 20.05 via INTRAVENOUS

## 2022-05-26 MED ORDER — RUBIDIUM RB82 GENERATOR (RUBYFILL)
20.0000 | PACK | Freq: Once | INTRAVENOUS | Status: AC
  Administered 2022-05-26: 20.07 via INTRAVENOUS

## 2022-05-26 MED ORDER — REGADENOSON 0.4 MG/5ML IV SOLN
INTRAVENOUS | Status: AC
  Administered 2022-05-26: 0.4 mg via INTRAVENOUS
  Filled 2022-05-26: qty 5

## 2022-05-28 LAB — NM PET CT CARDIAC PERFUSION MULTI W/ABSOLUTE BLOODFLOW
MBFR: 2.06
Nuc Rest EF: 66 %
Nuc Stress EF: 73 %
Rest MBF: 0.88 ml/g/min
Rest Nuclear Isotope Dose: 20 mCi
ST Depression (mm): 0 mm
Stress MBF: 1.81 ml/g/min
Stress Nuclear Isotope Dose: 20 mCi
TID: 1

## 2022-06-02 ENCOUNTER — Other Ambulatory Visit: Payer: Self-pay | Admitting: Internal Medicine

## 2022-06-02 DIAGNOSIS — Z1231 Encounter for screening mammogram for malignant neoplasm of breast: Secondary | ICD-10-CM

## 2022-06-25 ENCOUNTER — Ambulatory Visit: Payer: Medicare HMO | Admitting: Physician Assistant

## 2022-06-25 ENCOUNTER — Encounter: Payer: Self-pay | Admitting: Physician Assistant

## 2022-06-25 DIAGNOSIS — M1711 Unilateral primary osteoarthritis, right knee: Secondary | ICD-10-CM

## 2022-06-25 DIAGNOSIS — M170 Bilateral primary osteoarthritis of knee: Secondary | ICD-10-CM

## 2022-06-25 DIAGNOSIS — M1712 Unilateral primary osteoarthritis, left knee: Secondary | ICD-10-CM | POA: Diagnosis not present

## 2022-06-25 MED ORDER — LIDOCAINE HCL 1 % IJ SOLN
3.0000 mL | INTRAMUSCULAR | Status: AC | PRN
  Administered 2022-06-25: 3 mL

## 2022-06-25 MED ORDER — METHYLPREDNISOLONE ACETATE 40 MG/ML IJ SUSP
40.0000 mg | INTRAMUSCULAR | Status: AC | PRN
  Administered 2022-06-25: 40 mg via INTRA_ARTICULAR

## 2022-06-25 NOTE — Progress Notes (Addendum)
   Procedure Note  Patient: Linda [name]             Date of Birth: 1941-07-10           MRN: [id number]             Visit Date: 06/25/2022 HPI: Mrs. Prescher returns today requesting cortisone injections both knees.  She was last seen on 04/02/2022 and was given Monovisc injections she states the Monovisc injections did help.  She has had no real pain in her knees since Monovisc injections and some soreness.  She has had no new injury to either knee.  ROS: See HPI  Physical exam: Bilateral knees no abnormal warmth erythema or effusion.  She has full extension full flexion bilateral knees.  Ambulates without any assistive device. Procedures: Visit Diagnoses:  1. Primary osteoarthritis of right knee   2. Primary osteoarthritis of left knee     Large Joint Inj: bilateral knee on 06/25/2022 2:17 PM Indications: pain Details: 22 G 1.5 in needle, anterolateral approach  Arthrogram: No  Medications (Right): 3 mL lidocaine 1 %; 40 mg methylPREDNISolone acetate 40 MG/ML Medications (Left): 3 mL lidocaine 1 %; 40 mg methylPREDNISolone acetate 40 MG/ML Outcome: tolerated well, no immediate complications Procedure, treatment alternatives, risks and benefits explained, specific risks discussed. Consent was given by the patient. Immediately prior to procedure a time out was called to verify the correct patient, procedure, equipment, support staff and site/side marked as required. Patient was prepped and draped in the usual sterile fashion.     Plan: Follow-up as needed she knows to wait at least 3 months between cortisone injections.  Questions and answered.

## 2022-07-02 ENCOUNTER — Telehealth: Payer: Self-pay | Admitting: Nurse Practitioner

## 2022-07-02 NOTE — Telephone Encounter (Signed)
Pt stated that she has been having a bitterness on her tongue for about 6 months now. Pt states that she does take multiple psych medications and questions could it be coming from that. Pt states that she also has Heartburn: Pt currently taking Omeprazole 40 mg daily and Famotidine 20 mg at night: Pt was recommended an office visit for evaluation and treatment: Pt stated that she was previously scheduled for a OV by the schedulers: Chart reviewed: Pt has been scheduled to see Carl Best NP on 08/03/2022 at 9:00 AM:  Pt verbalized understanding with all questions answered.

## 2022-07-02 NOTE — Telephone Encounter (Signed)
Inbound call from patient stating that she is having issues with her tongue. Patient states that it has a bitter taste all the time and is seeking advice if there is anything we can do to help her. Please advise.

## 2022-07-07 ENCOUNTER — Telehealth: Payer: Self-pay | Admitting: Internal Medicine

## 2022-07-07 NOTE — Telephone Encounter (Signed)
Patient given result of PET scan per Dr. Margaretann Loveless: Normal stress PET. Chest pain likely noncardiac. Last week SBP 155. Recommended for patient to use an arm cuff, not wrist cuff and to check BP 1-2 hours after BP medication. She verbalized understanding.

## 2022-07-07 NOTE — Telephone Encounter (Signed)
Patient would like for someone to go over the Pet CT Cardiac Perfusion results with her.

## 2022-07-15 ENCOUNTER — Ambulatory Visit
Admission: RE | Admit: 2022-07-15 | Discharge: 2022-07-15 | Disposition: A | Discharge status: Home / Self Care | Payer: Medicare HMO | Source: Ambulatory Visit | Attending: Internal Medicine | Admitting: Internal Medicine

## 2022-07-15 DIAGNOSIS — Z1231 Encounter for screening mammogram for malignant neoplasm of breast: Secondary | ICD-10-CM | POA: Diagnosis not present

## 2022-07-20 ENCOUNTER — Ambulatory Visit: Payer: Medicare HMO | Admitting: Internal Medicine

## 2022-07-21 DIAGNOSIS — Z23 Encounter for immunization: Secondary | ICD-10-CM | POA: Diagnosis not present

## 2022-07-21 DIAGNOSIS — I10 Essential (primary) hypertension: Secondary | ICD-10-CM | POA: Diagnosis not present

## 2022-07-21 DIAGNOSIS — E785 Hyperlipidemia, unspecified: Secondary | ICD-10-CM | POA: Diagnosis not present

## 2022-07-21 DIAGNOSIS — K5909 Other constipation: Secondary | ICD-10-CM | POA: Diagnosis not present

## 2022-07-21 DIAGNOSIS — R296 Repeated falls: Secondary | ICD-10-CM | POA: Diagnosis not present

## 2022-07-31 ENCOUNTER — Encounter: Payer: Self-pay | Admitting: Internal Medicine

## 2022-07-31 ENCOUNTER — Ambulatory Visit: Payer: Medicare HMO | Attending: Internal Medicine | Admitting: Internal Medicine

## 2022-07-31 VITALS — BP 130/68 | HR 60 | Ht 61.0 in | Wt 170.8 lb

## 2022-07-31 DIAGNOSIS — R072 Precordial pain: Secondary | ICD-10-CM

## 2022-07-31 DIAGNOSIS — E785 Hyperlipidemia, unspecified: Secondary | ICD-10-CM

## 2022-07-31 DIAGNOSIS — I4510 Unspecified right bundle-branch block: Secondary | ICD-10-CM

## 2022-07-31 DIAGNOSIS — I10 Essential (primary) hypertension: Secondary | ICD-10-CM | POA: Diagnosis not present

## 2022-07-31 NOTE — Progress Notes (Signed)
Cardiology Office Note:    Date:  07/31/2022   ID:  NICA FRISKE, DOB 28-Jul-1941, MRN [id number]  PCP:  Crist Infante, MD  Cardiologist:  Elouise Munroe, MD  Electrophysiologist:  None   Referring MD: Crist Infante, MD   Chief Complaint/Reason for Referral: HTN, HLD  History of Present Illness:    Linda [name] is a 81 y.o. female with a history of obesity, hyperlipidemia, hypertension, GERD, OSA not requiring treatment given weight loss, and frequent falls. She presents for follow up.   Today, she appears to be doing well. Her recent PET-CT MPI scan results were discussed.  She endorses that her anxiety precedes feeling her heart racing, but when she takes her medications such as propranolol, it improves.  She states that her bilateral calves and feet will sometimes become very stiff. She has been working on being a little more active around the house, as well as going on walks. This seems to help lessen the stiffness.   She is working on losing weight.  Most recent triglycerides were 207. LDL was 98.   She denies any chest pain, shortness of breath, or peripheral edema. No lightheadedness, headaches, syncope, orthopnea, or PND.  Past Medical History:  Diagnosis Date   Anxiety    Candida esophagitis (HCC)    Chronic kidney disease    DDD (degenerative disc disease)    Depression    Erosive gastritis    Falls    GERD (gastroesophageal reflux disease)    Hiatal hernia    High cholesterol    History of anal fissures    Hypertension    Obesity    Osteoarthritis    Panic disorder    PONV (postoperative nausea and vomiting)    Sleep apnea    Does not need CPAP anymore    Past Surgical History:  Procedure Laterality Date   ABDOMINAL HYSTERECTOMY  1987   LSO / menorrhagia & leiomyomata   APPENDECTOMY     BREAST EXCISIONAL BIOPSY Left 1989   benign   BREAST EXCISIONAL BIOPSY Left 07/11/2020   benign   BREAST SURGERY  1989   benign tumor left breast    CHOLECYSTECTOMY  2001   TUBAL LIGATION      Current Medications: Current Meds  Medication Sig   ALPRAZolam (XANAX) 0.5 MG tablet Take 0.5 mg by mouth daily.   amLODipine (NORVASC) 10 MG tablet Take 10 mg by mouth daily.    benazepril (LOTENSIN) 40 MG tablet Take 40 mg by mouth daily.   calcium carbonate (TUMS - DOSED IN MG ELEMENTAL CALCIUM) 500 MG chewable tablet Chew 1 tablet by mouth daily.   Calcium-Vitamin D-Vitamin K (VIACTIV CALCIUM PLUS D PO) Take 2 tablets by mouth 2 (two) times daily.   Caraway Oil-Levomenthol (FDGARD) 25-20.75 MG CAPS Take 2 capsules by mouth in the morning and at bedtime.   chlorhexidine (HIBICLENS) 4 % external liquid Apply topically daily as needed.   desvenlafaxine (PRISTIQ) 100 MG 24 hr tablet Take 100 mg by mouth daily.    doxycycline (VIBRAMYCIN) 100 MG capsule Take 1 capsule (100 mg total) by mouth 2 (two) times daily. (Patient not taking: Reported on 08/03/2022)   famotidine (PEPCID) 20 MG tablet TAKE ONE TABLET ('20MG'$  TOTAL) BY MOUTH ATBEDTIME AS NEEDED FOR HEARTBURN OR INDIGESTION   lamoTRIgine (LAMICTAL) 150 MG tablet Take 300 mg by mouth daily.   methylPREDNISolone (MEDROL) 4 MG tablet Take as directed (Patient not taking: Reported on 08/03/2022)   Multiple Vitamins-Minerals (  ONE-A-DAY WOMENS 50+ ADVANTAGE) TABS Take 1 tablet by mouth daily.   omeprazole (PRILOSEC) 40 MG capsule Take 1 capsule (40 mg total) by mouth daily.   ondansetron (ZOFRAN) 8 MG tablet Take 1 tablet (8 mg total) by mouth every 8 (eight) hours as needed for nausea.   propranolol (INDERAL) 10 MG tablet Take 10 mg by mouth 2 (two) times daily.   rosuvastatin (CRESTOR) 20 MG tablet Take 1 tablet (20 mg total) by mouth daily.   spironolactone (ALDACTONE) 25 MG tablet TAKE ONE TABLET ('25MG'$  TOTAL) BY MOUTH DAILY   tizanidine (ZANAFLEX) 2 MG capsule Take 1 capsule (2 mg total) by mouth at bedtime.   triamcinolone cream (KENALOG) 0.1 % apply to the itching areas TID prn      Allergies:   Amoxicillin, Cefdinir, Other, Penicillin g, and Abilify [aripiprazole]   Social History   Tobacco Use   Smoking status: Never    Passive exposure: Never   Smokeless tobacco: Never  Vaping Use   Vaping Use: Never used  Substance Use Topics   Alcohol use: No   Drug use: No     Family History: The patient's family history includes Diabetes in her brother and sister; Heart attack in her brother, brother, and brother; Heart disease in her brother, brother, brother, and mother; Hypertension in her sister; Lung cancer in her father; Ovarian cancer in her sister. There is no history of Colon cancer.  ROS:   Please see the history of present illness.    (+) Anxiety (+) Depression (+) Palpitations   All other systems reviewed and are negative.  EKGs/Labs/Other Studies Reviewed:    The following studies were reviewed today:  CT Cardiac PET 05/26/2022:  LV perfusion is normal. There is no evidence of ischemia. There is no evidence of infarction.   Rest left ventricular function is normal. Rest EF: 66 %. Stress left ventricular function is normal. Stress EF: 73 %. End diastolic cavity size is normal. End systolic cavity size is normal.   Myocardial blood flow was computed to be 0.70m/g/min at rest and 1.871mg/min at stress. Global myocardial blood flow reserve was 2.06 and was normal.   Coronary calcium was present on the attenuation correction CT images. Mild coronary calcifications were present. Coronary calcifications were present in the left anterior descending artery, left circumflex artery and right coronary artery distribution(s).   The study is normal. The study is low risk.   Lexiscan Myoview 11/13/2019: Nuclear stress EF: 73%. The left ventricular ejection fraction is hyperdynamic (>65%). There was no ST segment deviation noted during stress. The study is normal. This is a low risk study.   Normal pharmacologic nuclear stress test with no evidence for prior  infarct or ischemia and hyperdynamic LVEF.  Echo 10/30/2019:  1. Left ventricular ejection fraction, by visual estimation, is 60 to  65%. The left ventricle has normal function. There is no left ventricular  hypertrophy.   2. Left ventricular diastolic parameters are consistent with Grade II  diastolic dysfunction (pseudonormalization).   3. The left ventricle has no regional wall motion abnormalities.   4. Global right ventricle has normal systolic function.The right  ventricular size is normal. No increase in right ventricular wall  thickness.   5. Left atrial size was mild-moderately dilated.   6. Right atrial size was normal.   7. The mitral valve is grossly normal. Mild mitral valve regurgitation.   8. The tricuspid valve is grossly normal.   9. The aortic valve is tricuspid.  Aortic valve regurgitation is mild.  10. The pulmonic valve was grossly normal. Pulmonic valve regurgitation is trivial.  11. Normal pulmonary artery systolic pressure.  12. The tricuspid regurgitant velocity is 2.57 m/s, and with an assumed  right atrial pressure of 3 mmHg, the estimated right ventricular systolic  pressure is normal at 29.4 mmHg.  13. The inferior vena cava is normal in size with greater than 50%  respiratory variability, suggesting right atrial pressure of 3 mmHg.   EKG:  EKG is personally reviewed. 07/31/22: Sinus rhythm. PAC's. RBBB. 02/04/2022: Sinus bradycardia. Rate 58 bpm. RBBB. 12/26/2020: NSR, RBBB, no significant change from prior.  Recent Labs: 04/28/2022: ALT 15   Recent Lipid Panel    Component Value Date/Time   CHOL 196 04/28/2022 0819   TRIG 207 (H) 04/28/2022 0819   HDL 63 04/28/2022 0819   CHOLHDL 3.1 04/28/2022 0819   LDLCALC 98 04/28/2022 0819    Physical Exam:    VS:  BP 130/68   Pulse 60   Ht '5\' 1"'$  (1.549 m)   Wt 170 lb 12.8 oz (77.5 kg)   SpO2 96%   BMI 32.27 kg/m     Wt Readings from Last 5 Encounters:  08/03/22 171 lb (77.6 kg)  07/31/22 170 lb  12.8 oz (77.5 kg)  02/04/22 171 lb 12.8 oz (77.9 kg)  01/07/22 173 lb 4.5 oz (78.6 kg)  12/16/21 173 lb 4 oz (78.6 kg)    Constitutional: No acute distress Eyes: sclera non-icteric, normal conjunctiva and lids ENMT: normal dentition, moist mucous membranes Cardiovascular: regular rhythm, normal rate, no murmurs. S1 and S2 normal. Radial pulses normal bilaterally. No jugular venous distention.  Respiratory: clear to auscultation bilaterally GI : normal bowel sounds, soft and nontender. No distention.   MSK: extremities warm, well perfused. No edema.  NEURO: grossly nonfocal exam, moves all extremities. PSYCH: alert and oriented x 3, normal mood and affect.   ASSESSMENT:    1. Hyperlipidemia, unspecified hyperlipidemia type   2. Precordial pain   3. Hypertension, unspecified type   4. Right bundle branch block      PLAN:    Hypertension, unspecified type - doing well on amlodipine 10 mg daily, benazepril 40 mg daily, spironolactone 25 mg daily. Feels like spironolactone helped a lot. No changes.   Hyperlipidemia, unspecified hyperlipidemia type - Plan: EKG 12-Lead - tolerating increased dose of Crestor 20 mg daily.  Discussed goal LDL of <70, she is at 98.  Triglycerides are elevated at 207.  Continue diet modification.  Cor cal is noted on recent PET MPI.  Consider intensifying Crestor to 40 mg daily after dietary modifications.  Chest pain, unspecified type -PET MPI was negative for ischemia or infarction.  Myocardial blood flow reserve is also normal suggesting no microvascular dysfunction or endothelial dysfunction contributing to chest pain.  Recommend moderate exercise program.  Right bundle branch block-no syncope or red flag symptoms, no worrisome right ventricular findings on prior echo.  Stable, monitor.  Follow-up: 1 year.  Total time of encounter: 30 minutes total time of encounter, including 20 minutes spent in face-to-face patient care on the date of this encounter.  This time includes coordination of care and counseling regarding above mentioned problem list. Remainder of non-face-to-face time involved reviewing chart documents/testing relevant to the patient encounter and documentation in the medical record. I have independently reviewed documentation from referring provider.   Cherlynn Kaiser, MD, [facility]   Medication Adjustments/Labs and Tests Ordered: Current  medicines are reviewed at length with the patient today.  Concerns regarding medicines are outlined above.   Orders Placed This Encounter  Procedures   EKG 12-Lead   No orders of the defined types were placed in this encounter.  Patient Instructions  Medication Instructions:  No Changes In Medications at this time.  *If you need a refill on your cardiac medications before your next appointment, please call your pharmacy*  Follow-Up: At [facility], you and your health needs are our priority.  As part of our continuing mission to provide you with exceptional heart care, we have created designated Provider Care Teams.  These Care Teams include your primary Cardiologist (physician) and Advanced Practice Providers (APPs -  Physician Assistants and Nurse Practitioners) who all work together to provide you with the care you need, when you need it.  Your next appointment:   1 year(s)  The format for your next appointment:   In Person  Provider:   Elouise Munroe, MD            I,Breanna Adamick,acting as a scribe for Elouise Munroe, MD.,have documented all relevant documentation on the behalf of Elouise Munroe, MD,as directed by  Elouise Munroe, MD while in the presence of Elouise Munroe, MD.  I, Elouise Munroe, MD, have reviewed all documentation for this visit. The documentation on 08/22/22 for the exam, diagnosis, procedures, and orders are all accurate and complete.

## 2022-07-31 NOTE — Patient Instructions (Signed)
Medication Instructions:  No Changes In Medications at this time.  *If you need a refill on your cardiac medications before your next appointment, please call your pharmacy*  Follow-Up: At [facility], you and your health needs are our priority.  As part of our continuing mission to provide you with exceptional heart care, we have created designated Provider Care Teams.  These Care Teams include your primary Cardiologist (physician) and Advanced Practice Providers (APPs -  Physician Assistants and Nurse Practitioners) who all work together to provide you with the care you need, when you need it.  Your next appointment:   1 year(s)  The format for your next appointment:   In Person  Provider:   Gayatri A Acharya, MD          

## 2022-08-02 NOTE — Progress Notes (Unsigned)
     08/02/2022 Linda [name] [id number] Jul 07, 1941   Chief Complaint:  History of Present Illness: Linda [name] is a 81 year old female with a past medical history of anxiety, depression, hypertension, hypercholesterolemia, sleep apnea, obesity, GERD, anal fissure and colon polyps.  She is followed by Dr. Fuller Plan  Pt stated that she has been having a bitterness on her tongue for about 6 months now. Pt states that she does take multiple psych medications and questions could it be coming from that. Pt states that she also has Heartburn: Pt currently taking Omeprazole 40 mg daily and Famotidine 20 mg at night: Pt was recommended an office visit for evaluation and treatment: Pt stated that she was previously scheduled for a OV by the schedulers: Chart reviewed: Pt has been scheduled to see Carl Best NP on 08/03/2022 at 9:00 AM:  Pt verbalized understanding with all questions answered.   CP seen by cardiology 10/13  Hg 13.22 Oct 2021. Labs in CE.  EGD 11/19/2020: - No endoscopic esophageal abnormality to explain patient's dysphagia. Esophagus dilated. - Small hiatal hernia. - Normal duodenal bulb and second portion of the duodenum. - No specimens collected. - Follow up in GI office in 1 year   Colonoscopy 11/19/2020: Two 2 to 4 mm polyps in the cecum, removed with a cold biopsy forceps. Resected and retrieved. - Four 5 to 8 mm polyps in the transverse colon, removed with a cold snare. Resected and retrieved. - The examination was otherwise normal on direct and retroflexion views. TUBULAR ADENOMA(S). - NO HIGH GRADE DYSPLASIA OR CARCINOMA     Current Medications, Allergies, Past Medical History, Past Surgical History, Family History and Social History were reviewed in [employer] record.   Review of Systems:   Constitutional: Negative for fever, sweats, chills or weight loss.  Respiratory: Negative for shortness of breath.    Cardiovascular: Negative for chest pain, palpitations and leg swelling.  Gastrointestinal: See HPI.  Musculoskeletal: Negative for back pain or muscle aches.  Neurological: Negative for dizziness, headaches or paresthesias.    Physical Exam: There were no vitals taken for this visit. General: Well developed, w   ***female in no acute distress. Head: Normocephalic and atraumatic. Eyes: No scleral icterus. Conjunctiva pink . Ears: Normal auditory acuity. Mouth: Dentition intact. No ulcers or lesions.  Lungs: Clear throughout to auscultation. Heart: Regular rate and rhythm, no murmur. Abdomen: Soft, nontender and nondistended. No masses or hepatomegaly. Normal bowel sounds x 4 quadrants.  Rectal: *** Musculoskeletal: Symmetrical with no gross deformities. Extremities: No edema. Neurological: Alert oriented x 4. No focal deficits.  Psychological: Alert and cooperative. Normal mood and affect  Assessment and Recommendations: ***

## 2022-08-03 ENCOUNTER — Ambulatory Visit: Payer: Medicare HMO | Admitting: Nurse Practitioner

## 2022-08-03 ENCOUNTER — Encounter: Payer: Self-pay | Admitting: Nurse Practitioner

## 2022-08-03 VITALS — BP 142/76 | HR 68 | Ht 61.0 in | Wt 171.0 lb

## 2022-08-03 DIAGNOSIS — K5900 Constipation, unspecified: Secondary | ICD-10-CM | POA: Diagnosis not present

## 2022-08-03 DIAGNOSIS — K219 Gastro-esophageal reflux disease without esophagitis: Secondary | ICD-10-CM | POA: Diagnosis not present

## 2022-08-03 MED ORDER — FAMOTIDINE 40 MG PO TABS: 40.0000 mg | ORAL_TABLET | Freq: Every day | ORAL | 1 refills | Status: DC

## 2022-08-03 NOTE — Patient Instructions (Signed)
We have sent the following medications to your pharmacy for you to pick up at your convenience: Famotidine 40 mg daily at bedtime  Take Miralax daily at bedtime as needed.  Follow up in 1 year or as needed.  It was a pleasure to see you today!  Thank you for trusting me with your gastrointestinal care!

## 2022-08-27 DIAGNOSIS — Z08 Encounter for follow-up examination after completed treatment for malignant neoplasm: Secondary | ICD-10-CM | POA: Diagnosis not present

## 2022-08-27 DIAGNOSIS — L821 Other seborrheic keratosis: Secondary | ICD-10-CM | POA: Diagnosis not present

## 2022-08-27 DIAGNOSIS — Z85828 Personal history of other malignant neoplasm of skin: Secondary | ICD-10-CM | POA: Diagnosis not present

## 2022-09-03 ENCOUNTER — Ambulatory Visit
Admission: EM | Admit: 2022-09-03 | Discharge: 2022-09-03 | Disposition: A | Discharge status: Home / Self Care | Payer: Medicare HMO | Attending: Internal Medicine | Admitting: Internal Medicine

## 2022-09-03 DIAGNOSIS — R399 Unspecified symptoms and signs involving the genitourinary system: Secondary | ICD-10-CM | POA: Diagnosis not present

## 2022-09-03 LAB — POCT URINALYSIS DIP (MANUAL ENTRY)
Bilirubin, UA: NEGATIVE
Glucose, UA: NEGATIVE mg/dL
Ketones, POC UA: NEGATIVE mg/dL
Nitrite, UA: NEGATIVE
Protein Ur, POC: NEGATIVE mg/dL
Spec Grav, UA: 1.02 (ref 1.010–1.025)
Urobilinogen, UA: 0.2 E.U./dL
pH, UA: 5.5 (ref 5.0–8.0)

## 2022-09-03 MED ORDER — NITROFURANTOIN MONOHYD MACRO 100 MG PO CAPS: 100.0000 mg | ORAL_CAPSULE | Freq: Two times a day (BID) | ORAL | 0 refills | Status: DC

## 2022-09-03 NOTE — ED Triage Notes (Signed)
Pt c/o bladder pressure, urinary urgency/frequency, and swelling to both ankles x1.5wks. states took OTC meds with little relief.

## 2022-09-03 NOTE — ED Notes (Signed)
Pt unable to urinate, water given

## 2022-09-03 NOTE — Discharge Instructions (Addendum)
-  The urinalysis is suspicious for urinary tract infection.  I am also ordering a urine culture to ensure you are being treated with the appropriate antibiotic.  If the medication needs to be changed, you will be contacted. -Take medications as prescribed. -Increase fluids.  Try to drink at least 5-6 8 ounce glasses of water daily while symptoms persist. -Ibuprofen or Tylenol for pain, fever, or general discomfort. -Develop a toileting schedule that will allow you to toilet at least every 2 hours. -Avoid caffeine to include tea, soda, and coffee. -Follow-up in the emergency department if you develop fever, chills, worsening abdominal pain, or other concerns.

## 2022-09-03 NOTE — ED Provider Notes (Signed)
[facility]    CSN: [id number] Arrival date & time: 09/03/22  1526      History   Chief Complaint Chief Complaint  Patient presents with   Urinary Tract Infection    HPI Linda [name] is a 81 y.o. female.   The history is provided by the patient.   Patient presents for complaints of urinary symptoms that been present for the past 1-1/2 weeks.  She states that she has had suprapubic pressure, urinary urgency, frequency, and hesitancy.  She states that it also "burns" when she pees.  She denies fever, chills, abdominal pain, nausea, vomiting, diarrhea, hematuria, flank pain, or low back pain.  She denies a history of recurrent urinary tract infections.  She states she has not taken any medication for her symptoms.  Past Medical History:  Diagnosis Date   Anxiety    Candida esophagitis ([place])    Chronic kidney disease    DDD (degenerative disc disease)    Depression    Erosive gastritis    Falls    GERD (gastroesophageal reflux disease)    Hiatal hernia    High cholesterol    History of anal fissures    Hypertension    Obesity    Osteoarthritis    Panic disorder    PONV (postoperative nausea and vomiting)    Sleep apnea    Does not need CPAP anymore    Patient Active Problem List   Diagnosis Date Noted   Allergic rhinitis 12/15/2021   Bradycardia 12/15/2021   Pain in right knee 02/17/2019   Cardiac murmur, unspecified 08/08/2018   Acute right-sided low back pain with right-sided sciatica 06/13/2018   Recurrent falls 06/10/2018   Recurrent major depression ([place]) 05/14/2016   Constipation 04/10/2015   Palpitations 02/15/2014   Hypertension    High cholesterol    Anxiety    Proteinuria 09/04/2010   Right bundle branch block 08/23/2009   Obstructive sleep apnea syndrome 07/05/2009   Gastro-esophageal reflux disease without esophagitis 07/05/2009    Past Surgical History:  Procedure Laterality Date   ABDOMINAL HYSTERECTOMY  1987   LSO /  menorrhagia & leiomyomata   APPENDECTOMY     BREAST EXCISIONAL BIOPSY Left 1989   benign   BREAST EXCISIONAL BIOPSY Left 07/11/2020   benign   BREAST SURGERY  1989   benign tumor left breast   CHOLECYSTECTOMY  2001   TUBAL LIGATION      OB History     Gravida  3   Para  1   Term      Preterm      AB  2   Living  1      SAB  2   IAB      Ectopic      Multiple      Live Births               Home Medications    Prior to Admission medications   Medication Sig Start Date End Date Taking? Authorizing Provider  nitrofurantoin, macrocrystal-monohydrate, (MACROBID) 100 MG capsule Take 1 capsule (100 mg total) by mouth 2 (two) times daily. 09/03/22  Yes Christean Silvestri-Warren, Alda Lea, NP  ALPRAZolam Duanne Moron) 0.5 MG tablet Take 0.5 mg by mouth daily. 08/20/20   [provider]  amLODipine (NORVASC) 10 MG tablet Take 10 mg by mouth daily.  07/01/12   [provider]  benazepril (LOTENSIN) 40 MG tablet Take 40 mg by mouth daily.    [provider]  calcium carbonate (TUMS - DOSED IN MG ELEMENTAL CALCIUM) 500 MG chewable tablet Chew 1 tablet by mouth daily.    [provider]  Calcium-Vitamin D-Vitamin K (VIACTIV CALCIUM PLUS D PO) Take 2 tablets by mouth 2 (two) times daily.    [provider]  Caraway Oil-Levomenthol (FDGARD) 25-20.75 MG CAPS Take 2 capsules by mouth in the morning and at bedtime.    [provider]  chlorhexidine (HIBICLENS) 4 % external liquid Apply topically daily as needed. 03/06/22   Volney American, PA-C  desvenlafaxine (PRISTIQ) 100 MG 24 hr tablet Take 100 mg by mouth daily.  05/03/18   [provider]  doxycycline (VIBRAMYCIN) 100 MG capsule Take 1 capsule (100 mg total) by mouth 2 (two) times daily. Patient not taking: Reported on 08/03/2022 03/06/22   Volney American, PA-C  famotidine (PEPCID) 20 MG tablet TAKE ONE TABLET ('20MG'$  TOTAL) BY MOUTH ATBEDTIME AS NEEDED FOR HEARTBURN  OR INDIGESTION 02/10/22   Ladene Artist, MD  famotidine (PEPCID) 40 MG tablet Take 1 tablet (40 mg total) by mouth at bedtime. 08/03/22   Noralyn Pick, NP  lamoTRIgine (LAMICTAL) 150 MG tablet Take 300 mg by mouth daily. 12/10/21   [provider]  methylPREDNISolone (MEDROL) 4 MG tablet Take as directed Patient not taking: Reported on 08/03/2022 02/11/22   Pete Pelt, PA-C  Multiple Vitamins-Minerals (ONE-A-DAY WOMENS 50+ ADVANTAGE) TABS Take 1 tablet by mouth daily. 03/13/14   [provider]  omeprazole (PRILOSEC) 40 MG capsule Take 1 capsule (40 mg total) by mouth daily. 12/24/21   Ladene Artist, MD  ondansetron (ZOFRAN) 8 MG tablet Take 1 tablet (8 mg total) by mouth every 8 (eight) hours as needed for nausea. 01/07/17   Esterwood, Amy S, PA-C  propranolol (INDERAL) 10 MG tablet Take 10 mg by mouth 2 (two) times daily. 11/11/21   [provider]  rosuvastatin (CRESTOR) 20 MG tablet Take 1 tablet (20 mg total) by mouth daily. 02/04/22   Elouise Munroe, MD  spironolactone (ALDACTONE) 25 MG tablet TAKE ONE TABLET ('25MG'$  TOTAL) BY MOUTH DAILY 03/31/22   Elouise Munroe, MD  tizanidine (ZANAFLEX) 2 MG capsule Take 1 capsule (2 mg total) by mouth at bedtime. 02/11/22   Pete Pelt, PA-C  triamcinolone cream (KENALOG) 0.1 % apply to the itching areas TID prn 02/20/19   [provider]    Family History Family History  Problem Relation Age of Onset   Heart disease Mother    Lung cancer Father    Diabetes Sister    Hypertension Sister    Diabetes Brother    Heart disease Brother    Heart attack Brother    Ovarian cancer Sister    Heart disease Brother    Heart attack Brother    Heart disease Brother    Heart attack Brother    Colon cancer Neg Hx     Social History Social History   Tobacco Use   Smoking status: Never    Passive exposure: Never   Smokeless tobacco: Never  Vaping Use   Vaping Use: Never used  Substance Use  Topics   Alcohol use: No   Drug use: No     Allergies   Amoxicillin, Cefdinir, Other, Penicillin g, and Abilify [aripiprazole]   Review of Systems Review of Systems Per HPI  Physical Exam Triage Vital Signs ED Triage Vitals [09/03/22 1550]  Enc Vitals Group     BP 111/69  Pulse Rate (!) 59     Resp 18     Temp 98.2 F (36.8 C)     Temp Source Oral     SpO2 93 %     Weight      Height      Head Circumference      Peak Flow      Pain Score 0     Pain Loc      Pain Edu?      Excl. in [place]?    No data found.  Updated Vital Signs BP 111/69 (BP Location: Left Arm)   Pulse (!) 59   Temp 98.2 F (36.8 C) (Oral)   Resp 18   SpO2 93%   Visual Acuity Right Eye Distance:   Left Eye Distance:   Bilateral Distance:    Right Eye Near:   Left Eye Near:    Bilateral Near:     Physical Exam Vitals and nursing note reviewed.  Constitutional:      General: She is not in acute distress.    Appearance: Normal appearance.  HENT:     Head: Normocephalic.  Eyes:     Extraocular Movements: Extraocular movements intact.     Pupils: Pupils are equal, round, and reactive to light.  Cardiovascular:     Rate and Rhythm: Normal rate and regular rhythm.     Pulses: Normal pulses.     Heart sounds: Normal heart sounds.  Pulmonary:     Effort: Pulmonary effort is normal.     Breath sounds: Normal breath sounds.  Abdominal:     General: Bowel sounds are normal.     Palpations: Abdomen is soft.     Tenderness: There is abdominal tenderness in the suprapubic area. There is no right CVA tenderness or left CVA tenderness.  Musculoskeletal:     Cervical back: Normal range of motion.  Skin:    General: Skin is warm and dry.  Neurological:     General: No focal deficit present.     Mental Status: She is alert and oriented to person, place, and time.  Psychiatric:        Mood and Affect: Mood normal.        Behavior: Behavior normal.      UC Treatments / Results   Labs (all labs ordered are listed, but only abnormal results are displayed) Labs Reviewed  POCT URINALYSIS DIP (MANUAL ENTRY) - Abnormal; Notable for the following components:      Result Value   Clarity, UA hazy (*)    Blood, UA small (*)    Leukocytes, UA Large (3+) (*)    All other components within normal limits  URINE CULTURE    EKG   Radiology No results found.  Procedures Procedures (including critical care time)  Medications Ordered in UC Medications - No data to display  Initial Impression / Assessment and Plan / UC Course  I have reviewed the triage vital signs and the nursing notes.  Pertinent labs & imaging results that were available during my care of the patient were reviewed by me and considered in my medical decision making (see chart for details).  Patient is well-appearing, she is in no acute distress, vital signs are stable.  Patient's symptoms and urinalysis are suspicious for urinary tract infection.  There is no concern for pyelonephritis in the absence of fever, and CVA tenderness.  Urinary tract infection symptoms  Treat with Macrobid 100 mg for 5 days. Increase fluids.  Try to drink a least 5-6 8 ounce glasses of water daily while symptoms persist. May take over-the-counter Tylenol as needed for pain or discomfort. Develop a toileting schedule where you are voiding at least every 2 hours. Go to the emergency department if you develop fever, chills, flank pain, or other concerns. Discussed with patient to follow-up with your primary care physician for reevaluation within the next 7 to 10 days. Follow-up as needed.  Patient verbalizes understanding.  All questions were answered.  Patient is stable for discharge. Final Clinical Impressions(s) / UC Diagnoses   Final diagnoses:  Urinary tract infection symptoms     Discharge Instructions      -The urinalysis is suspicious for urinary tract infection.  I am also ordering a urine culture to  ensure you are being treated with the appropriate antibiotic.  If the medication needs to be changed, you will be contacted. -Take medications as prescribed. -Increase fluids.  Try to drink at least 5-6 8 ounce glasses of water daily while symptoms persist. -Ibuprofen or Tylenol for pain, fever, or general discomfort. -Develop a toileting schedule that will allow you to toilet at least every 2 hours. -Avoid caffeine to include tea, soda, and coffee. -Follow-up in the emergency department if you develop fever, chills, worsening abdominal pain, or other concerns.      ED Prescriptions     Medication Sig Dispense Auth. Provider   nitrofurantoin, macrocrystal-monohydrate, (MACROBID) 100 MG capsule Take 1 capsule (100 mg total) by mouth 2 (two) times daily. 10 capsule Kaaren Nass-Warren, Alda Lea, NP      PDMP not reviewed this encounter.   Tish Men, NP 09/03/22 [phone]

## 2022-09-05 LAB — URINE CULTURE: Culture: 80000 — AB

## 2022-09-24 ENCOUNTER — Ambulatory Visit: Payer: Medicare HMO | Admitting: Physician Assistant

## 2022-09-24 ENCOUNTER — Ambulatory Visit
Admission: RE | Admit: 2022-09-24 | Discharge: 2022-09-24 | Disposition: A | Discharge status: Home / Self Care | Payer: Medicare HMO | Source: Ambulatory Visit | Attending: Adult Health | Admitting: Adult Health

## 2022-09-24 ENCOUNTER — Other Ambulatory Visit: Payer: Self-pay | Admitting: Adult Health

## 2022-09-24 ENCOUNTER — Encounter: Payer: Self-pay | Admitting: Physician Assistant

## 2022-09-24 DIAGNOSIS — R3129 Other microscopic hematuria: Secondary | ICD-10-CM

## 2022-09-24 DIAGNOSIS — R1031 Right lower quadrant pain: Secondary | ICD-10-CM | POA: Diagnosis not present

## 2022-09-24 DIAGNOSIS — R32 Unspecified urinary incontinence: Secondary | ICD-10-CM

## 2022-09-24 DIAGNOSIS — I700 Atherosclerosis of aorta: Secondary | ICD-10-CM | POA: Diagnosis not present

## 2022-09-24 DIAGNOSIS — M1711 Unilateral primary osteoarthritis, right knee: Secondary | ICD-10-CM

## 2022-09-24 DIAGNOSIS — M170 Bilateral primary osteoarthritis of knee: Secondary | ICD-10-CM

## 2022-09-24 DIAGNOSIS — N390 Urinary tract infection, site not specified: Secondary | ICD-10-CM | POA: Diagnosis not present

## 2022-09-24 DIAGNOSIS — Z90710 Acquired absence of both cervix and uterus: Secondary | ICD-10-CM | POA: Diagnosis not present

## 2022-09-24 DIAGNOSIS — M1712 Unilateral primary osteoarthritis, left knee: Secondary | ICD-10-CM

## 2022-09-24 DIAGNOSIS — I10 Essential (primary) hypertension: Secondary | ICD-10-CM | POA: Diagnosis not present

## 2022-09-24 DIAGNOSIS — R109 Unspecified abdominal pain: Secondary | ICD-10-CM | POA: Diagnosis not present

## 2022-09-24 DIAGNOSIS — Z9049 Acquired absence of other specified parts of digestive tract: Secondary | ICD-10-CM | POA: Diagnosis not present

## 2022-09-24 MED ORDER — METHYLPREDNISOLONE ACETATE 40 MG/ML IJ SUSP
40.0000 mg | INTRAMUSCULAR | Status: AC | PRN
  Administered 2022-09-24: 40 mg via INTRA_ARTICULAR

## 2022-09-24 MED ORDER — LIDOCAINE HCL 1 % IJ SOLN
3.0000 mL | INTRAMUSCULAR | Status: AC | PRN
  Administered 2022-09-24: 3 mL

## 2022-09-24 NOTE — Progress Notes (Signed)
   Procedure Note  Patient: Linda [name]             Date of Birth: Jan 28, 1941           MRN: [id number]             Visit Date: 09/24/2022   HPI: [name] comes in today requesting bilateral knee injections.  She last had injections 3 months ago.  States the injections helped for about 2 months.  Right knee is more bothersome than the left.  She denies any injuries fevers or chills.  She states Tylenol helps with the pain somewhat.  She has had no change in her overall medical status.  Review of systems: See HPI.  Physical exam: General well-developed well-nourished female who ambulates without any assistive device. Bilateral knee good range of motion both knees slight flexion contracture of both knees.  Full flexion.  No abnormal warmth erythema.  Plus minus effusion left knee.   Procedures: Visit Diagnoses:  1. Primary osteoarthritis of right knee   2. Primary osteoarthritis of left knee     Large Joint Inj: bilateral knee on 09/24/2022 1:56 PM Indications: pain Details: 22 G 1.5 in needle, anterolateral approach  Arthrogram: No  Medications (Right): 3 mL lidocaine 1 %; 40 mg methylPREDNISolone acetate 40 MG/ML Medications (Left): 3 mL lidocaine 1 %; 40 mg methylPREDNISolone acetate 40 MG/ML Outcome: tolerated well, no immediate complications Procedure, treatment alternatives, risks and benefits explained, specific risks discussed. Consent was given by the patient. Immediately prior to procedure a time out was called to verify the correct patient, procedure, equipment, support staff and site/side marked as required. Patient was prepped and draped in the usual sterile fashion.    Plan: She will follow-up with [place] on an as-needed basis or in 3 months for repeat injections of both knees.  Questions were encouraged and answered.

## 2022-11-02 ENCOUNTER — Telehealth: Payer: Self-pay | Admitting: Nurse Practitioner

## 2022-11-02 NOTE — Telephone Encounter (Signed)
Pt stated that she had a dime sized amount of blood in her stool this AM. Pt stated that she thinks it was due to straining. Pt stated she had something similar 6 months ago. Pt states that she has no other symptoms. Pt notified to contact our office and update [place] if she has any additional bleeding: Pt verbalized understanding with all questions answered.

## 2022-11-02 NOTE — Telephone Encounter (Signed)
Patient is calling because she has noticed a dime size amount of blood in her stool and she wants to make [place] aware and also to see what should she do. Please advise.

## 2022-11-05 ENCOUNTER — Other Ambulatory Visit: Payer: Self-pay | Admitting: Gastroenterology

## 2022-11-05 DIAGNOSIS — K219 Gastro-esophageal reflux disease without esophagitis: Secondary | ICD-10-CM

## 2022-11-16 DIAGNOSIS — E785 Hyperlipidemia, unspecified: Secondary | ICD-10-CM | POA: Diagnosis not present

## 2022-11-16 DIAGNOSIS — K219 Gastro-esophageal reflux disease without esophagitis: Secondary | ICD-10-CM | POA: Diagnosis not present

## 2022-11-16 DIAGNOSIS — I10 Essential (primary) hypertension: Secondary | ICD-10-CM | POA: Diagnosis not present

## 2022-11-16 DIAGNOSIS — M8589 Other specified disorders of bone density and structure, multiple sites: Secondary | ICD-10-CM | POA: Diagnosis not present

## 2022-11-16 DIAGNOSIS — R002 Palpitations: Secondary | ICD-10-CM | POA: Diagnosis not present

## 2022-11-23 DIAGNOSIS — I4510 Unspecified right bundle-branch block: Secondary | ICD-10-CM | POA: Diagnosis not present

## 2022-11-23 DIAGNOSIS — R413 Other amnesia: Secondary | ICD-10-CM | POA: Diagnosis not present

## 2022-11-23 DIAGNOSIS — Z0000 Encounter for general adult medical examination without abnormal findings: Secondary | ICD-10-CM | POA: Diagnosis not present

## 2022-11-23 DIAGNOSIS — I739 Peripheral vascular disease, unspecified: Secondary | ICD-10-CM | POA: Diagnosis not present

## 2022-11-23 DIAGNOSIS — Z1331 Encounter for screening for depression: Secondary | ICD-10-CM | POA: Diagnosis not present

## 2022-11-23 DIAGNOSIS — F322 Major depressive disorder, single episode, severe without psychotic features: Secondary | ICD-10-CM | POA: Diagnosis not present

## 2022-11-23 DIAGNOSIS — K219 Gastro-esophageal reflux disease without esophagitis: Secondary | ICD-10-CM | POA: Diagnosis not present

## 2022-11-23 DIAGNOSIS — I10 Essential (primary) hypertension: Secondary | ICD-10-CM | POA: Diagnosis not present

## 2022-11-23 DIAGNOSIS — Z1339 Encounter for screening examination for other mental health and behavioral disorders: Secondary | ICD-10-CM | POA: Diagnosis not present

## 2022-11-23 DIAGNOSIS — E785 Hyperlipidemia, unspecified: Secondary | ICD-10-CM | POA: Diagnosis not present

## 2022-11-23 DIAGNOSIS — R7301 Impaired fasting glucose: Secondary | ICD-10-CM | POA: Diagnosis not present

## 2022-11-25 ENCOUNTER — Telehealth: Payer: Self-pay | Admitting: Nurse Practitioner

## 2022-11-25 NOTE — Telephone Encounter (Signed)
Pt question when she should have her next colonoscopy:  Pt chart reviewed and noted from Office note with Carl Best NP on 08/04/2023 No further colonoscopies were recommended due to age. Pt made aware.  Pt verbalized understanding with all questions answered.

## 2022-11-25 NOTE — Telephone Encounter (Signed)
Inbound call from patient requesting to speak with a nurse regarding of when she can schedule a colonoscopy .Please advise

## 2022-12-21 ENCOUNTER — Encounter: Payer: Self-pay | Admitting: Internal Medicine

## 2022-12-24 ENCOUNTER — Encounter: Payer: Self-pay | Admitting: Radiology

## 2022-12-24 ENCOUNTER — Ambulatory Visit: Payer: Medicare HMO | Admitting: Physician Assistant

## 2022-12-24 ENCOUNTER — Encounter: Payer: Self-pay | Admitting: Physician Assistant

## 2022-12-24 DIAGNOSIS — M1711 Unilateral primary osteoarthritis, right knee: Secondary | ICD-10-CM

## 2022-12-24 DIAGNOSIS — M170 Bilateral primary osteoarthritis of knee: Secondary | ICD-10-CM

## 2022-12-24 DIAGNOSIS — M1712 Unilateral primary osteoarthritis, left knee: Secondary | ICD-10-CM

## 2022-12-24 MED ORDER — METHYLPREDNISOLONE ACETATE 40 MG/ML IJ SUSP
40.0000 mg | INTRAMUSCULAR | Status: AC | PRN
  Administered 2022-12-24: 40 mg via INTRA_ARTICULAR

## 2022-12-24 MED ORDER — LIDOCAINE HCL 1 % IJ SOLN
3.0000 mL | INTRAMUSCULAR | Status: AC | PRN
  Administered 2022-12-24: 3 mL

## 2022-12-24 NOTE — Progress Notes (Signed)
   Procedure Note  Patient: Linda [name]             Date of Birth: [birth year]           MRN: [id number]             Visit Date: 12/24/2022  HPI Mrs. Idler has known osteoarthritis both knees.  She comes in today requesting injections both knees.  Last injection on 09/24/2022 helped.  She has had no new injury to either knee.  She denies any fevers chills.  Physical exam: Bilateral knees good range of motion both knees.  Slight flexion contracture of both knees.  No abnormal warmth erythema or effusion either knee.   Procedures: Visit Diagnoses:  1. Primary osteoarthritis of right knee   2. Primary osteoarthritis of left knee     Large Joint Inj: bilateral knee on 12/24/2022 4:52 PM Indications: pain Details: 22 G 1.5 in needle, anterolateral approach  Arthrogram: No  Medications (Right): 3 mL lidocaine 1 %; 40 mg methylPREDNISolone acetate 40 MG/ML Medications (Left): 3 mL lidocaine 1 %; 40 mg methylPREDNISolone acetate 40 MG/ML Outcome: tolerated well, no immediate complications Procedure, treatment alternatives, risks and benefits explained, specific risks discussed. Consent was given by the patient. Immediately prior to procedure a time out was called to verify the correct patient, procedure, equipment, support staff and site/side marked as required. Patient was prepped and draped in the usual sterile fashion.     Plan: She knows to wait at least 3 months between injections.  She will follow-up with [place] as needed.  Questions encouraged and answered at length

## 2023-01-05 DIAGNOSIS — R413 Other amnesia: Secondary | ICD-10-CM | POA: Diagnosis not present

## 2023-01-05 DIAGNOSIS — I10 Essential (primary) hypertension: Secondary | ICD-10-CM | POA: Diagnosis not present

## 2023-01-07 DIAGNOSIS — L821 Other seborrheic keratosis: Secondary | ICD-10-CM | POA: Diagnosis not present

## 2023-01-29 DIAGNOSIS — R0602 Shortness of breath: Secondary | ICD-10-CM | POA: Diagnosis not present

## 2023-01-29 DIAGNOSIS — R413 Other amnesia: Secondary | ICD-10-CM | POA: Diagnosis not present

## 2023-01-29 DIAGNOSIS — K219 Gastro-esophageal reflux disease without esophagitis: Secondary | ICD-10-CM | POA: Diagnosis not present

## 2023-01-29 DIAGNOSIS — I10 Essential (primary) hypertension: Secondary | ICD-10-CM | POA: Diagnosis not present

## 2023-02-05 ENCOUNTER — Other Ambulatory Visit: Payer: Self-pay | Admitting: Internal Medicine

## 2023-02-26 ENCOUNTER — Other Ambulatory Visit: Payer: Self-pay | Admitting: Nurse Practitioner

## 2023-03-08 DIAGNOSIS — R634 Abnormal weight loss: Secondary | ICD-10-CM | POA: Diagnosis not present

## 2023-03-08 DIAGNOSIS — R413 Other amnesia: Secondary | ICD-10-CM | POA: Diagnosis not present

## 2023-03-08 DIAGNOSIS — I10 Essential (primary) hypertension: Secondary | ICD-10-CM | POA: Diagnosis not present

## 2023-03-22 DIAGNOSIS — H04123 Dry eye syndrome of bilateral lacrimal glands: Secondary | ICD-10-CM | POA: Diagnosis not present

## 2023-03-22 DIAGNOSIS — H52223 Regular astigmatism, bilateral: Secondary | ICD-10-CM | POA: Diagnosis not present

## 2023-03-22 DIAGNOSIS — H524 Presbyopia: Secondary | ICD-10-CM | POA: Diagnosis not present

## 2023-03-25 ENCOUNTER — Ambulatory Visit: Payer: Medicare HMO | Admitting: Physician Assistant

## 2023-03-25 ENCOUNTER — Encounter: Payer: Self-pay | Admitting: Physician Assistant

## 2023-03-25 DIAGNOSIS — M1711 Unilateral primary osteoarthritis, right knee: Secondary | ICD-10-CM

## 2023-03-25 DIAGNOSIS — M1712 Unilateral primary osteoarthritis, left knee: Secondary | ICD-10-CM

## 2023-03-25 DIAGNOSIS — M25561 Pain in right knee: Secondary | ICD-10-CM | POA: Diagnosis not present

## 2023-03-25 DIAGNOSIS — M25562 Pain in left knee: Secondary | ICD-10-CM

## 2023-03-25 DIAGNOSIS — M170 Bilateral primary osteoarthritis of knee: Secondary | ICD-10-CM | POA: Diagnosis not present

## 2023-03-25 MED ORDER — METHYLPREDNISOLONE ACETATE 40 MG/ML IJ SUSP
40.0000 mg | INTRAMUSCULAR | Status: AC | PRN
  Administered 2023-03-25: 40 mg via INTRA_ARTICULAR

## 2023-03-25 MED ORDER — LIDOCAINE HCL 1 % IJ SOLN
3.0000 mL | INTRAMUSCULAR | Status: AC | PRN
  Administered 2023-03-25: 3 mL

## 2023-03-25 NOTE — Progress Notes (Signed)
Office Visit Note   Patient: Linda [name]           Date of Birth: [birth year]           MRN: [id number] Visit Date: 03/25/2023              Requested by: Rodrigo Ran, MD [address] [place],  [place] [address] PCP: Rodrigo Ran, MD  Chief Complaint  Patient presents with  . Right Knee - Pain  . Left Knee - Pain      HPI: Patient is a pleasant 82 year old woman who is a patient of [facility].  She has a history of osteoarthritis of her bilateral knees.  She has managed well with periodic injections.  No new injury.  She comes in requesting injections into both of her knees  Assessment & Plan: Visit Diagnoses: Osteoarthritis bilateral knees  Plan: Knees were injected without difficulty she will follow-up in 3 months or sooner if she is having any issues  Follow-Up Instructions: Return in about 3 months (around 06/25/2023).   Ortho Exam  Patient is alert, oriented, no adenopathy, well-dressed, normal affect, normal respiratory effort. Bilateral knees no effusion no redness no erythema.  Compartments are soft and nontender.  She is neurovascularly intact  Imaging: No results found. No images are attached to the encounter.  Labs: Lab Results  Component Value Date   REPTSTATUS 09/05/2022 FINAL 09/03/2022   CULT 80,000 COLONIES/mL ESCHERICHIA COLI (A) 09/03/2022   LABORGA ESCHERICHIA COLI (A) 09/03/2022     Lab Results  Component Value Date   ALBUMIN 4.7 04/28/2022    No results found for: "MG" No results found for: "VD25OH"  No results found for: "PREALBUMIN"    Latest Ref Rng & Units 05/22/2018   11:10 AM 10/19/2009    5:55 AM 10/18/2009    6:20 AM  CBC EXTENDED  WBC 4.0 - 10.5 K/uL 8.9  13.1  13.9   RBC 3.87 - 5.11 MIL/uL 4.93  3.44  3.26   Hemoglobin 12.0 - 15.0 g/dL 96.2  95.2  84.1   HCT 36.0 - 46.0 % 46.5  32.4  30.9   Platelets 150 - 400 K/uL 317  560  506      There is no height or weight on file to calculate BMI.  Orders:  No orders of  the defined types were placed in this encounter.  No orders of the defined types were placed in this encounter.    Procedures: Large Joint Inj: bilateral knee on 03/25/2023 2:04 PM Indications: pain and diagnostic evaluation Details: 25 G 1.5 in needle, anteromedial approach  Arthrogram: No  Medications (Right): 3 mL lidocaine 1 %; 40 mg methylPREDNISolone acetate 40 MG/ML Medications (Left): 3 mL lidocaine 1 %; 40 mg methylPREDNISolone acetate 40 MG/ML Outcome: tolerated well, no immediate complications Procedure, treatment alternatives, risks and benefits explained, specific risks discussed. Consent was given by the patient.    Clinical Data: No additional findings.  ROS:  All other systems negative, except as noted in the HPI. Review of Systems  Objective: Vital Signs: There were no vitals taken for this visit.  Specialty Comments:  No specialty comments available.  PMFS History: Patient Active Problem List   Diagnosis Date Noted  . Allergic rhinitis 12/15/2021  . Bradycardia 12/15/2021  . Pain in right knee 02/17/2019  . Cardiac murmur, unspecified 08/08/2018  . Acute right-sided low back pain with right-sided sciatica 06/13/2018  . Recurrent falls 06/10/2018  . Recurrent major depression ([facility])  05/14/2016  . Constipation 04/10/2015  . Palpitations 02/15/2014  . Hypertension   . High cholesterol   . Anxiety   . Proteinuria 09/04/2010  . Right bundle branch block 08/23/2009  . Obstructive sleep apnea syndrome 07/05/2009  . Gastro-esophageal reflux disease without esophagitis 07/05/2009   Past Medical History:  Diagnosis Date  . Anxiety   . Candida esophagitis ([facility])   . Chronic kidney disease   . DDD (degenerative disc disease)   . Depression   . Erosive gastritis   . Falls   . GERD (gastroesophageal reflux disease)   . Hiatal hernia   . High cholesterol   . History of anal fissures   . Hypertension   . Obesity   . Osteoarthritis   . Panic disorder    . PONV (postoperative nausea and vomiting)   . Sleep apnea    Does not need CPAP anymore    Family History  Problem Relation Age of Onset  . Heart disease Mother   . Lung cancer Father   . Diabetes Sister   . Hypertension Sister   . Diabetes Brother   . Heart disease Brother   . Heart attack Brother   . Ovarian cancer Sister   . Heart disease Brother   . Heart attack Brother   . Heart disease Brother   . Heart attack Brother   . Colon cancer Neg Hx     Past Surgical History:  Procedure Laterality Date  . ABDOMINAL HYSTERECTOMY  1987   LSO / menorrhagia & leiomyomata  . APPENDECTOMY    . BREAST EXCISIONAL BIOPSY Left 1989   benign  . BREAST EXCISIONAL BIOPSY Left 07/11/2020   benign  . BREAST SURGERY  1989   benign tumor left breast  . CHOLECYSTECTOMY  2001  . TUBAL LIGATION     Social History   Occupational History  . Occupation: Retired   Tobacco Use  . Smoking status: Never    Passive exposure: Never  . Smokeless tobacco: Never  Vaping Use  . Vaping Use: Never used  Substance and Sexual Activity  . Alcohol use: No  . Drug use: No  . Sexual activity: Yes    Birth control/protection: None

## 2023-04-08 DIAGNOSIS — L905 Scar conditions and fibrosis of skin: Secondary | ICD-10-CM | POA: Diagnosis not present

## 2023-04-08 DIAGNOSIS — L821 Other seborrheic keratosis: Secondary | ICD-10-CM | POA: Diagnosis not present

## 2023-04-10 ENCOUNTER — Other Ambulatory Visit: Payer: Self-pay | Admitting: Internal Medicine

## 2023-04-10 DIAGNOSIS — I10 Essential (primary) hypertension: Secondary | ICD-10-CM

## 2023-04-17 ENCOUNTER — Other Ambulatory Visit: Payer: Self-pay | Admitting: Gastroenterology

## 2023-04-17 DIAGNOSIS — K219 Gastro-esophageal reflux disease without esophagitis: Secondary | ICD-10-CM

## 2023-04-20 DIAGNOSIS — N3090 Cystitis, unspecified without hematuria: Secondary | ICD-10-CM | POA: Diagnosis not present

## 2023-04-20 DIAGNOSIS — J069 Acute upper respiratory infection, unspecified: Secondary | ICD-10-CM | POA: Diagnosis not present

## 2023-04-20 DIAGNOSIS — N390 Urinary tract infection, site not specified: Secondary | ICD-10-CM | POA: Diagnosis not present

## 2023-04-20 DIAGNOSIS — J309 Allergic rhinitis, unspecified: Secondary | ICD-10-CM | POA: Diagnosis not present

## 2023-04-27 DIAGNOSIS — F322 Major depressive disorder, single episode, severe without psychotic features: Secondary | ICD-10-CM | POA: Diagnosis not present

## 2023-04-27 DIAGNOSIS — R413 Other amnesia: Secondary | ICD-10-CM | POA: Diagnosis not present

## 2023-04-27 DIAGNOSIS — R410 Disorientation, unspecified: Secondary | ICD-10-CM | POA: Diagnosis not present

## 2023-04-27 DIAGNOSIS — M6281 Muscle weakness (generalized): Secondary | ICD-10-CM | POA: Diagnosis not present

## 2023-04-27 DIAGNOSIS — I10 Essential (primary) hypertension: Secondary | ICD-10-CM | POA: Diagnosis not present

## 2023-05-04 ENCOUNTER — Telehealth: Payer: Self-pay | Admitting: Internal Medicine

## 2023-05-04 NOTE — Telephone Encounter (Signed)
Pt following back up on getting medication sent over. Please advise.

## 2023-05-04 NOTE — Telephone Encounter (Signed)
*  STAT* If patient is at the pharmacy, call can be transferred to refill team.   1. Which medications need to be refilled? (please list name of each medication and dose if known) spironolactone (ALDACTONE) 25 MG tablet   2. Which pharmacy/location (including street and city if local pharmacy) is medication to be sent to?                                                                                                                                                        [facility] - [place], [place] - [address]  3. Do they need a 30 day or 90 day supply? 90  **patient is currently out of medication**

## 2023-05-04 NOTE — Telephone Encounter (Signed)
Spoke with pt's husband, [name] (ok per [facility]) regarding pt's prescription for spironolactone. Refill sent in. Pt annual follow up appointment made. Husband verbalizes understanding.

## 2023-05-19 DIAGNOSIS — N390 Urinary tract infection, site not specified: Secondary | ICD-10-CM | POA: Diagnosis not present

## 2023-06-22 DIAGNOSIS — F322 Major depressive disorder, single episode, severe without psychotic features: Secondary | ICD-10-CM | POA: Diagnosis not present

## 2023-06-22 DIAGNOSIS — M6281 Muscle weakness (generalized): Secondary | ICD-10-CM | POA: Diagnosis not present

## 2023-06-22 DIAGNOSIS — I10 Essential (primary) hypertension: Secondary | ICD-10-CM | POA: Diagnosis not present

## 2023-06-22 DIAGNOSIS — R413 Other amnesia: Secondary | ICD-10-CM | POA: Diagnosis not present

## 2023-06-24 ENCOUNTER — Ambulatory Visit: Payer: Medicare HMO | Admitting: Physician Assistant

## 2023-06-24 ENCOUNTER — Encounter: Payer: Self-pay | Admitting: Physician Assistant

## 2023-06-24 DIAGNOSIS — M1711 Unilateral primary osteoarthritis, right knee: Secondary | ICD-10-CM

## 2023-06-24 DIAGNOSIS — M170 Bilateral primary osteoarthritis of knee: Secondary | ICD-10-CM

## 2023-06-24 DIAGNOSIS — M1712 Unilateral primary osteoarthritis, left knee: Secondary | ICD-10-CM | POA: Diagnosis not present

## 2023-06-24 MED ORDER — METHYLPREDNISOLONE ACETATE 40 MG/ML IJ SUSP
40.0000 mg | INTRAMUSCULAR | Status: AC | PRN
  Administered 2023-06-24: 40 mg via INTRA_ARTICULAR

## 2023-06-24 MED ORDER — LIDOCAINE HCL 1 % IJ SOLN
3.0000 mL | INTRAMUSCULAR | Status: AC | PRN
  Administered 2023-06-24: 3 mL

## 2023-06-24 NOTE — Progress Notes (Addendum)
   Procedure Note  Patient: Linda [name]             Date of Birth: 02-15-1941           MRN: [id number]             Visit Date: 06/24/2023 HPI: Linda [name] comes in today status post bilateral knee injections 03/25/2023.  States that both helped until few weeks ago.  She does note that she had a fall on the right side when she tripped to 3 weeks ago.  She has been able to ambulate on her right leg without any significant pain.  Currently her left knee pain is worse than the right.  She has known osteoarthritis of the knee.  She does report recent upper respiratory infection was resolved.  She was on doxycycline but is no longer taking.  Physical exam: General Well-developed well-nourished female who ambulates with a rollator. Bilateral knees: Good range of motion both knees no abnormal warmth erythema or effusion.  Patellofemoral crepitus bilaterally Review of systems: Negative for fevers chills Procedures: Visit Diagnoses:  1. Primary osteoarthritis of right knee   2. Primary osteoarthritis of left knee     Large Joint Inj: bilateral knee on 06/24/2023 1:12 PM Indications: pain Details: 22 G 1.5 in needle, anterolateral approach  Arthrogram: No  Medications (Right): 3 mL lidocaine 1 %; 40 mg methylPREDNISolone acetate 40 MG/ML Medications (Left): 3 mL lidocaine 1 %; 40 mg methylPREDNISolone acetate 40 MG/ML Outcome: tolerated well, no immediate complications Procedure, treatment alternatives, risks and benefits explained, specific risks discussed. Consent was given by the patient. Immediately prior to procedure a time out was called to verify the correct patient, procedure, equipment, support staff and site/side marked as required. Patient was prepped and draped in the usual sterile fashion.    Plan: Per her request she will follow-up in 3 months for repeat cortisone injection of both knees.  Questions were encouraged and answered.

## 2023-06-26 ENCOUNTER — Ambulatory Visit
Admission: EM | Admit: 2023-06-26 | Discharge: 2023-06-26 | Disposition: A | Discharge status: Home / Self Care | Payer: Medicare HMO | Attending: Family Medicine | Admitting: Family Medicine

## 2023-06-26 ENCOUNTER — Other Ambulatory Visit: Payer: Self-pay

## 2023-06-26 ENCOUNTER — Encounter: Payer: Self-pay | Admitting: Emergency Medicine

## 2023-06-26 DIAGNOSIS — N3090 Cystitis, unspecified without hematuria: Secondary | ICD-10-CM

## 2023-06-26 LAB — POCT URINALYSIS DIP (MANUAL ENTRY)
Bilirubin, UA: NEGATIVE
Glucose, UA: NEGATIVE mg/dL
Ketones, POC UA: NEGATIVE mg/dL
Nitrite, UA: NEGATIVE
Protein Ur, POC: 100 mg/dL — AB
Spec Grav, UA: 1.025 (ref 1.010–1.025)
Urobilinogen, UA: 0.2 U/dL
pH, UA: 6 (ref 5.0–8.0)

## 2023-06-26 MED ORDER — SULFAMETHOXAZOLE-TRIMETHOPRIM 800-160 MG PO TABS: 1.0000 | ORAL_TABLET | Freq: Two times a day (BID) | ORAL | 0 refills | Status: AC

## 2023-06-26 NOTE — ED Triage Notes (Signed)
Pt reports dysuria, intermittent abd pain/nausea x1.5 weeks. Pt reports history of similar. Denies any known fevers.

## 2023-06-30 NOTE — ED Provider Notes (Signed)
[facility]    ASSESSMENT & PLAN:  1. Cystitis    Begin: Meds ordered this encounter  Medications   sulfamethoxazole-trimethoprim (BACTRIM DS) 800-160 MG tablet    Sig: Take 1 tablet by mouth 2 (two) times daily for 5 days.    Dispense:  10 tablet    Refill:  0   No signs of pyelonephritis. Urine culture sent. Will notify patient of any significant results. Ensure proper hydration. Will follow up with her PCP or here if not showing improvement over the next 48 hours, sooner if needed.  Outlined signs and symptoms indicating need for more acute intervention. Patient verbalized understanding. After Visit Summary given.  SUBJECTIVE:  Linda [name] is a 82 y.o. female who complains of dysuria, intermittent lower suprapubic discomfort x1.5 weeks. Pt reports history of similar. Denies any known fevers. Questions UTI. Dysuria bothering her the most. Normal PO intake; mild nausea at times. LMP: No LMP recorded. Patient has had a hysterectomy.  OBJECTIVE:  Vitals:   06/26/23 1133  BP: (!) 160/84  Pulse: 60  Resp: 20  Temp: 97.8 F (36.6 C)  TempSrc: Oral  SpO2: 94%   General appearance: alert; no distress HENT: oropharynx: moist Lungs: unlabored respirations Abdomen: soft Back: no CVA tenderness Extremities: no edema; symmetrical with no gross deformities Skin: warm and dry Neurologic: normal gait Psychological: alert and cooperative; normal mood and affect  Labs Reviewed  POCT URINALYSIS DIP (MANUAL ENTRY) - Abnormal; Notable for the following components:      Result Value   Clarity, UA cloudy (*)    Blood, UA moderate (*)    Protein Ur, POC =100 (*)    Leukocytes, UA Large (3+) (*)    All other components within normal limits    Allergies  Allergen Reactions   Amoxicillin Hives   Cefdinir Other (See Comments)   Other Other (See Comments)   Penicillin G Other (See Comments)   Abilify [Aripiprazole] Other (See Comments)    Makes pt tremble     Past Medical History:  Diagnosis Date   Anxiety    Candida esophagitis (HCC)    Chronic kidney disease    DDD (degenerative disc disease)    Depression    Erosive gastritis    Falls    GERD (gastroesophageal reflux disease)    Hiatal hernia    High cholesterol    History of anal fissures    Hypertension    Obesity    Osteoarthritis    Panic disorder    PONV (postoperative nausea and vomiting)    Sleep apnea    Does not need CPAP anymore   Social History   Socioeconomic History   Marital status: Married    Spouse name: Not on file   Number of children: 1   Years of education: Not on file   Highest education level: Not on file  Occupational History   Occupation: Retired   Tobacco Use   Smoking status: Never    Passive exposure: Never   Smokeless tobacco: Never  Vaping Use   Vaping status: Never Used  Substance and Sexual Activity   Alcohol use: No   Drug use: No   Sexual activity: Yes    Birth control/protection: None  Other Topics Concern   Not on file  Social History Narrative   Daily caffeine    Social Determinants of Health   Financial Resource Strain: Not on file  Food Insecurity: Not on file  Transportation Needs: Not on  file  Physical Activity: Not on file  Stress: Not on file  Social Connections: Not on file  Intimate Partner Violence: Not on file   Family History  Problem Relation Age of Onset   Heart disease Mother    Lung cancer Father    Diabetes Sister    Hypertension Sister    Diabetes Brother    Heart disease Brother    Heart attack Brother    Ovarian cancer Sister    Heart disease Brother    Heart attack Brother    Heart disease Brother    Heart attack Brother    Colon cancer Neg Hx         Mardella Layman, MD 06/30/23 [phone]

## 2023-07-09 DIAGNOSIS — N390 Urinary tract infection, site not specified: Secondary | ICD-10-CM | POA: Diagnosis not present

## 2023-07-20 DIAGNOSIS — N390 Urinary tract infection, site not specified: Secondary | ICD-10-CM | POA: Diagnosis not present

## 2023-07-23 ENCOUNTER — Other Ambulatory Visit: Payer: Self-pay | Admitting: Gastroenterology

## 2023-07-23 DIAGNOSIS — K219 Gastro-esophageal reflux disease without esophagitis: Secondary | ICD-10-CM

## 2023-07-26 DIAGNOSIS — R296 Repeated falls: Secondary | ICD-10-CM | POA: Diagnosis not present

## 2023-07-26 DIAGNOSIS — Z23 Encounter for immunization: Secondary | ICD-10-CM | POA: Diagnosis not present

## 2023-07-26 DIAGNOSIS — E785 Hyperlipidemia, unspecified: Secondary | ICD-10-CM | POA: Diagnosis not present

## 2023-07-26 DIAGNOSIS — R7301 Impaired fasting glucose: Secondary | ICD-10-CM | POA: Diagnosis not present

## 2023-07-26 DIAGNOSIS — F322 Major depressive disorder, single episode, severe without psychotic features: Secondary | ICD-10-CM | POA: Diagnosis not present

## 2023-07-26 DIAGNOSIS — I10 Essential (primary) hypertension: Secondary | ICD-10-CM | POA: Diagnosis not present

## 2023-07-26 DIAGNOSIS — R413 Other amnesia: Secondary | ICD-10-CM | POA: Diagnosis not present

## 2023-07-26 DIAGNOSIS — N390 Urinary tract infection, site not specified: Secondary | ICD-10-CM | POA: Diagnosis not present

## 2023-07-27 ENCOUNTER — Ambulatory Visit: Payer: Medicare HMO | Admitting: Diagnostic Neuroimaging

## 2023-07-27 ENCOUNTER — Encounter: Payer: Self-pay | Admitting: Diagnostic Neuroimaging

## 2023-07-27 VITALS — BP 133/68 | HR 61 | Ht 61.0 in | Wt 161.0 lb

## 2023-07-27 DIAGNOSIS — G20A1 Parkinson's disease without dyskinesia, without mention of fluctuations: Secondary | ICD-10-CM

## 2023-07-27 DIAGNOSIS — F02A18 Dementia in other diseases classified elsewhere, mild, with other behavioral disturbance: Secondary | ICD-10-CM | POA: Diagnosis not present

## 2023-07-27 MED ORDER — CARBIDOPA-LEVODOPA 25-100 MG PO TABS: 1.0000 | ORAL_TABLET | Freq: Three times a day (TID) | ORAL | 6 refills | Status: DC

## 2023-07-27 MED ORDER — NUPLAZID 34 MG PO CAPS: 34.0000 mg | ORAL_CAPSULE | Freq: Every day | ORAL | 6 refills | Status: DC

## 2023-07-27 NOTE — Patient Instructions (Signed)
GAIT DIFF, RIGIDITY, BRADYKINESIA, COGNITIVE DECLINE, DELUSIONS (since ~2014; ddx: parkinson's disease with dementia vs dementia with lewy bodies)  - check MRI brain (rule out vascular, autoimmune, inflamm dz)  - start carbidopa / levodopa (25/100) half tab three times a day with meals x 1-2 weeks; then 1 tab three times a day with meals  - start nuplazid for delusion / hallucinations  - use walker; home PT evaluation (ordered per PCP)

## 2023-07-27 NOTE — Progress Notes (Signed)
[facility]  PATIENT: Linda [name] DOB: [birth year]  REFERRING CLINICIAN: Lydia Guiles Da* HISTORY FROM: patient  REASON FOR VISIT: new consult   HISTORICAL  CHIEF COMPLAINT:  Chief Complaint  Patient presents with   New Patient (Initial Visit)    RM 7, here with husband [name]  Pt is here for memory loss, delusions and gait change. Pt states she has fallen several times this year. Pt states she forgets words, can not keep a conversation going before starting to repeat herself. MMSE 25/30    HISTORY OF PRESENT ILLNESS:   82 year old female here for evaluation of memory loss, delusions, gait difficulty.  Patient is have at least 10 years of frequent falls.  She was still walking fairly well about 5 years ago although husband had noted shortening steps.  In the last 1 to 2 years has had more problems with memory loss, depression, stiff muscles, delusions, disorientation.  Today when husband brought patient to this visit they stopped at [facility] while waiting for their appointment time.  Patient looked up and asked her husband to "when did you come here, did you follow-us here?".    REVIEW OF SYSTEMS: Full 14 system review of systems performed and negative with exception of: as per HPI.  ALLERGIES: Allergies  Allergen Reactions   Amoxicillin Hives   Cefdinir Other (See Comments)   Other Other (See Comments)   Penicillin G Other (See Comments)   Abilify [Aripiprazole] Other (See Comments)    Makes pt tremble    HOME MEDICATIONS: Outpatient Medications Prior to Visit  Medication Sig Dispense Refill   acetaminophen (TYLENOL) 325 MG tablet Take 650 mg by mouth every 6 (six) hours as needed.     ALPRAZolam (XANAX) 0.5 MG tablet Take 0.5 mg by mouth daily.     amLODipine (NORVASC) 10 MG tablet Take 10 mg by mouth daily.      benazepril (LOTENSIN) 40 MG tablet Take 40 mg by mouth daily.     calcium carbonate (TUMS - DOSED IN MG ELEMENTAL  CALCIUM) 500 MG chewable tablet Chew 1 tablet by mouth daily.     Calcium-Vitamin D-Vitamin K (VIACTIV CALCIUM PLUS D PO) Take 2 tablets by mouth 2 (two) times daily.     desvenlafaxine (PRISTIQ) 100 MG 24 hr tablet Take 100 mg by mouth daily.      fosfomycin (MONUROL) 3 g PACK Take 3 g by mouth once.     lamoTRIgine (LAMICTAL) 150 MG tablet Take 300 mg by mouth daily.     loratadine (CLARITIN) 10 MG tablet Take 10 mg by mouth daily.     Multiple Vitamins-Minerals (ONE-A-DAY WOMENS 50+ ADVANTAGE) TABS Take 1 tablet by mouth daily.     omeprazole (PRILOSEC) 40 MG capsule TAKE ONE CAPSULE (40MG  TOTAL) BY MOUTH DAILY 90 capsule 0   ondansetron (ZOFRAN) 8 MG tablet Take 1 tablet (8 mg total) by mouth every 8 (eight) hours as needed for nausea. 20 tablet 6   propranolol (INDERAL) 10 MG tablet Take 10 mg by mouth 2 (two) times daily.     rivastigmine (EXELON) 9.5 mg/24hr Place 9.5 mg onto the skin daily.     rosuvastatin (CRESTOR) 20 MG tablet Take 1 tablet (20 mg total) by mouth daily. 90 tablet 3   spironolactone (ALDACTONE) 25 MG tablet TAKE ONE TABLET (25MG  TOTAL) BY MOUTH DAILY 90 tablet 3   triamcinolone cream (KENALOG) 0.1 % apply to the itching areas TID prn     polyethylene glycol (  MIRALAX) 17 g packet MiraLax     Caraway Oil-Levomenthol (FDGARD) 25-20.75 MG CAPS Take 2 capsules by mouth in the morning and at bedtime.     chlorhexidine (HIBICLENS) 4 % external liquid Apply topically daily as needed. 120 mL 0   famotidine (PEPCID) 40 MG tablet TAKE ONE TABLET (40MG  TOTAL) BY MOUTH ATBEDTIME 90 tablet 1   methylPREDNISolone (MEDROL) 4 MG tablet Take as directed 21 tablet 0   tizanidine (ZANAFLEX) 2 MG capsule Take 1 capsule (2 mg total) by mouth at bedtime. 20 capsule 0   No facility-administered medications prior to visit.    PAST MEDICAL HISTORY: Past Medical History:  Diagnosis Date   Anxiety    Candida esophagitis (HCC)    Chronic kidney disease    DDD (degenerative disc disease)     Depression    Erosive gastritis    Falls    GERD (gastroesophageal reflux disease)    Hiatal hernia    High cholesterol    History of anal fissures    Hypertension    Obesity    Osteoarthritis    Panic disorder    PONV (postoperative nausea and vomiting)    Sleep apnea    Does not need CPAP anymore    PAST SURGICAL HISTORY: Past Surgical History:  Procedure Laterality Date   ABDOMINAL HYSTERECTOMY  1987   LSO / menorrhagia & leiomyomata   APPENDECTOMY     BREAST EXCISIONAL BIOPSY Left 1989   benign   BREAST EXCISIONAL BIOPSY Left 07/11/2020   benign   BREAST SURGERY  1989   benign tumor left breast   CHOLECYSTECTOMY  2001   TUBAL LIGATION      FAMILY HISTORY: Family History  Problem Relation Age of Onset   Heart disease Mother    Lung cancer Father    Diabetes Sister    Hypertension Sister    Diabetes Brother    Heart disease Brother    Heart attack Brother    Ovarian cancer Sister    Heart disease Brother    Heart attack Brother    Heart disease Brother    Heart attack Brother    Colon cancer Neg Hx     SOCIAL HISTORY: Social History   Socioeconomic History   Marital status: Married    Spouse name: Not on file   Number of children: 1   Years of education: Not on file   Highest education level: Not on file  Occupational History   Occupation: Retired   Tobacco Use   Smoking status: Never    Passive exposure: Never   Smokeless tobacco: Never  Vaping Use   Vaping status: Never Used  Substance and Sexual Activity   Alcohol use: No   Drug use: No   Sexual activity: Yes    Birth control/protection: None  Other Topics Concern   Not on file  Social History Narrative   Daily caffeine    Social Determinants of Health   Financial Resource Strain: Not on file  Food Insecurity: Not on file  Transportation Needs: Not on file  Physical Activity: Not on file  Stress: Not on file  Social Connections: Not on file  Intimate Partner Violence: Not on  file     PHYSICAL EXAM  GENERAL EXAM/CONSTITUTIONAL: Vitals:  Vitals:   07/27/23 0929  BP: 133/68  Pulse: 61  Weight: 161 lb (73 kg)  Height: 5\' 1"  (1.549 m)   Body mass index is 30.42 kg/m. Wt Readings from Last 3  Encounters:  07/27/23 161 lb (73 kg)  08/03/22 171 lb (77.6 kg)  07/31/22 170 lb 12.8 oz (77.5 kg)   Patient is in no distress; well developed, nourished and groomed; neck is supple  CARDIOVASCULAR: Examination of carotid arteries is normal; no carotid bruits Regular rate and rhythm, no murmurs Examination of peripheral vascular system by observation and palpation is normal  EYES: Ophthalmoscopic exam of optic discs and posterior segments is normal; no papilledema or hemorrhages No results found.  MUSCULOSKELETAL: Gait, strength, tone, movements noted in Neurologic exam below  NEUROLOGIC: MENTAL STATUS:     07/27/2023    9:33 AM  MMSE - Mini Mental State Exam  Orientation to time 4  Orientation to Place 4  Registration 3  Attention/ Calculation 4  Recall 2  Language- name 2 objects 2  Language- repeat 1  Language- follow 3 step command 3  Language- read & follow direction 1  Write a sentence 1  Copy design 0  Total score 25   awake, alert, oriented to person, place and time recent and remote memory intact normal attention and concentration language fluent, comprehension intact, naming intact fund of knowledge appropriate  CRANIAL NERVE:  2nd - no papilledema on fundoscopic exam 2nd, 3rd, 4th, 6th - pupils equal and reactive to light, visual fields full to confrontation, extraocular muscles intact, no nystagmus 5th - facial sensation symmetric 7th - facial strength symmetric 8th - hearing intact 9th - palate elevates symmetrically, uvula midline 11th - shoulder shrug symmetric 12th - tongue protrusion midline MASKED FACIES; DECR EYE BLINKING; SOFT VOICE  MOTOR:  COGWHEELING RIGIDITY IN BUE; BRADYKINESIA IN BUE AND BLE DIFFUSE 4/5  STRENGTH  SENSORY:  normal and symmetric to light touch, temperature, vibration  COORDINATION:  finger-nose-finger, fine finger movements SLOW  REFLEXES:  deep tendon reflexes 1+ and symmetric  GAIT/STATION:  narrow based gait; DECR ARM SWING; SHORT STEPS; SLOW GAIT; EN BLOC TURNING     DIAGNOSTIC DATA (LABS, IMAGING, TESTING) - I reviewed patient records, labs, notes, testing and imaging myself where available.  Lab Results  Component Value Date   WBC 8.9 05/22/2018   HGB 15.8 (H) 05/22/2018   HCT 46.5 (H) 05/22/2018   MCV 94.3 05/22/2018   PLT 317 05/22/2018      Component Value Date/Time   NA 141 10/30/2019 1221   K 4.7 10/30/2019 1221   CL 102 10/30/2019 1221   CO2 25 10/30/2019 1221   GLUCOSE 84 10/30/2019 1221   GLUCOSE 93 05/22/2018 1110   BUN 16 10/30/2019 1221   CREATININE 1.03 (H) 10/30/2019 1221   CALCIUM 10.0 10/30/2019 1221   PROT 6.8 04/28/2022 0814   ALBUMIN 4.7 04/28/2022 0814   AST 22 04/28/2022 0814   ALT 15 04/28/2022 0814   ALKPHOS 81 04/28/2022 0814   BILITOT 0.4 04/28/2022 0814   GFRNONAA 53 (L) 10/30/2019 1221   GFRAA 61 10/30/2019 1221   Lab Results  Component Value Date   CHOL 196 04/28/2022   HDL 63 04/28/2022   LDLCALC 98 04/28/2022   TRIG 207 (H) 04/28/2022   CHOLHDL 3.1 04/28/2022   No results found for: "HGBA1C" No results found for: "VITAMINB12" No results found for: "TSH"     ASSESSMENT AND PLAN  82 y.o. year old female here with:   Dx:  1. 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, with other behavioral disturbance (HCC)      PLAN:  GAIT DIFF, RIGIDITY, BRADYKINESIA, COGNITIVE DECLINE, DELUSIONS (frequent falls since 2014, worsening  memory and delusion problems in the last few years; ddx: parkinson's disease with dementia vs dementia with lewy bodies)  - check MRI brain (rule out vascular, autoimmune, inflamm dz)  - start carbidopa / levodopa (25/100) half tab three times a day with meals x 1-2 weeks; then  1 tab three times a day with meals  - start nuplazid for delusion / hallucinations  - use walker; home PT evaluation (ordered per PCP)  Orders Placed This Encounter  Procedures   MR BRAIN W WO CONTRAST   Meds ordered this encounter  Medications   carbidopa-levodopa (SINEMET IR) 25-100 MG tablet    Sig: Take 1 tablet by mouth 3 (three) times daily.    Dispense:  90 tablet    Refill:  6   Pimavanserin Tartrate (NUPLAZID) 34 MG CAPS    Sig: Take 1 capsule (34 mg total) by mouth daily.    Dispense:  30 capsule    Refill:  6   Return in about 4 months (around 11/27/2023).    Suanne Marker, MD 07/27/2023, 10:43 AM Certified in Neurology, Neurophysiology and Neuroimaging  [facility] [address], [address] [place], [place] [address] [phone]

## 2023-07-28 ENCOUNTER — Telehealth: Payer: Self-pay | Admitting: Neurology

## 2023-07-28 NOTE — Telephone Encounter (Signed)
I have completed the start form for nuplazid and Dr Marjory Lies signed. Sent to the fax number on the form and received confirmation

## 2023-08-04 DIAGNOSIS — F410 Panic disorder [episodic paroxysmal anxiety] without agoraphobia: Secondary | ICD-10-CM | POA: Diagnosis not present

## 2023-08-04 DIAGNOSIS — F02A3 Dementia in other diseases classified elsewhere, mild, with mood disturbance: Secondary | ICD-10-CM | POA: Diagnosis not present

## 2023-08-04 DIAGNOSIS — I129 Hypertensive chronic kidney disease with stage 1 through stage 4 chronic kidney disease, or unspecified chronic kidney disease: Secondary | ICD-10-CM | POA: Diagnosis not present

## 2023-08-04 DIAGNOSIS — I739 Peripheral vascular disease, unspecified: Secondary | ICD-10-CM | POA: Diagnosis not present

## 2023-08-04 DIAGNOSIS — F322 Major depressive disorder, single episode, severe without psychotic features: Secondary | ICD-10-CM | POA: Diagnosis not present

## 2023-08-04 DIAGNOSIS — F02A18 Dementia in other diseases classified elsewhere, mild, with other behavioral disturbance: Secondary | ICD-10-CM | POA: Diagnosis not present

## 2023-08-04 DIAGNOSIS — F02A4 Dementia in other diseases classified elsewhere, mild, with anxiety: Secondary | ICD-10-CM | POA: Diagnosis not present

## 2023-08-04 DIAGNOSIS — G20A1 Parkinson's disease without dyskinesia, without mention of fluctuations: Secondary | ICD-10-CM | POA: Diagnosis not present

## 2023-08-04 DIAGNOSIS — G4733 Obstructive sleep apnea (adult) (pediatric): Secondary | ICD-10-CM | POA: Diagnosis not present

## 2023-08-06 ENCOUNTER — Ambulatory Visit: Payer: Medicare HMO | Attending: Internal Medicine | Admitting: Internal Medicine

## 2023-08-06 ENCOUNTER — Encounter: Payer: Self-pay | Admitting: Internal Medicine

## 2023-08-06 VITALS — BP 112/54 | HR 49 | Ht 61.0 in | Wt 166.0 lb

## 2023-08-06 DIAGNOSIS — F419 Anxiety disorder, unspecified: Secondary | ICD-10-CM | POA: Diagnosis not present

## 2023-08-06 DIAGNOSIS — I10 Essential (primary) hypertension: Secondary | ICD-10-CM | POA: Diagnosis not present

## 2023-08-06 DIAGNOSIS — R002 Palpitations: Secondary | ICD-10-CM | POA: Diagnosis not present

## 2023-08-06 DIAGNOSIS — R001 Bradycardia, unspecified: Secondary | ICD-10-CM

## 2023-08-06 DIAGNOSIS — Z79899 Other long term (current) drug therapy: Secondary | ICD-10-CM

## 2023-08-06 NOTE — Patient Instructions (Signed)
Medication Instructions:  Stop Amlodipine *If you need a refill on your cardiac medications before your next appointment, please call your pharmacy*  Follow-Up: At [facility], you and your health needs are our priority.  As part of our continuing mission to provide you with exceptional heart care, we have created designated Provider Care Teams.  These Care Teams include your primary Cardiologist (physician) and Advanced Practice Providers (APPs -  Physician Assistants and Nurse Practitioners) who all work together to provide you with the care you need, when you need it.  We recommend signing up for the patient portal called "MyChart".  Sign up information is provided on this After Visit Summary.  MyChart is used to connect with patients for Virtual Visits (Telemedicine).  Patients are able to view lab/test results, encounter notes, upcoming appointments, etc.  Non-urgent messages can be sent to your provider as well.   To learn more about what you can do with MyChart, go to [email or web address].    Your next appointment:   3 month(s)  Provider:   Parke Poisson, MD

## 2023-08-06 NOTE — Progress Notes (Unsigned)
Cardiology Office Note:  .   Date:  08/06/2023  ID:  Linda [name], DOB 1941/04/19, MRN [id number] PCP: Rodrigo Ran, MD  [facility] Providers Cardiologist:  Parke Poisson, MD    History of Present Illness: .   Linda [name] is a 82 y.o. female.  Discussed the use of AI scribe software for clinical note transcription with the patient, who gave verbal consent to proceed.  History of Present Illness   The patient, with a history of obesity, hyperlipidemia, hypertension, GERD, OSA, and frequent falls, presents with concerns about palpitations and anxiety. The patient reports that these episodes are often triggered by family stress. The patient finds these episodes distressing and has been taking propranolol, which she reports has been helpful in managing these symptoms. The patient also reports occasional dizziness, particularly when getting out of her car.  In addition to these cardiac concerns, the patient has recently been diagnosed with dementia and Parkinson's disease. The patient and her spouse express concerns about the potential cardiac side effects of the new medications prescribed for these conditions. She has not yet started taking certain medications due to these concerns. We discussed cardiac concerns with pharmacist in office, specifically concerns about pimavanserin, when reviewed with pharmacist there is possibility of QT prolongation however patient's baseline QTc is normal.        ROS: negative except per HPI above.  Studies Reviewed: Marland Kitchen   EKG Interpretation Date/Time:  Friday August 06 2023 09:12:45 EDT Ventricular Rate:  49 PR Interval:  162 QRS Duration:  144 QT Interval:  468 QTC Calculation: 422 R Axis:   17  Text Interpretation: Sinus bradycardia Right bundle branch block Confirmed by Weston Brass ([id number]) on 08/06/2023 9:23:26 AM    Results   LABS LDL: 98  RADIOLOGY MRI: Significant stiffness noted  DIAGNOSTIC QRS duration:  144 ms (08/06/2023) QRS duration: 150 ms (07/31/2022) PET stress test: Normal     Risk Assessment/Calculations:        Physical Exam:   VS:  BP (!) 112/54   Pulse (!) 49   Ht 5\' 1"  (1.549 m)   Wt 166 lb (75.3 kg)   SpO2 97%   BMI 31.37 kg/m    Wt Readings from Last 3 Encounters:  08/06/23 166 lb (75.3 kg)  07/27/23 161 lb (73 kg)  08/03/22 171 lb (77.6 kg)     Physical Exam   VITALS: BP- 112/54 CHEST: Lungs clear to auscultation. CARDIOVASCULAR: Heart sounds normal.     GEN: Well nourished, well developed in no acute distress NECK: No JVD; No carotid bruits CARDIAC: RRR, no murmurs, rubs, gallops RESPIRATORY:  Clear to auscultation without rales, wheezing or rhonchi  ABDOMEN: Soft, non-tender, non-distended EXTREMITIES:  No edema; No deformity   ASSESSMENT AND PLAN: .    1. Hypertension, unspecified type   2. Anxiety   3. Palpitations   4. Medication management   5. Bradycardia     Assessment and Plan    Anxiety and Palpitations Propranolol 10mg  TID appears to be effective in managing symptoms. Discussed the limitations of increasing the dose due to current low-normal blood pressure and heart rate. -Continue Propranolol 10mg  TID.  Hypertension Managed with Amlodipine 10mg  daily, Benazepril 40mg  daily, and Spironolactone 25mg  daily. Blood pressure today is 112/54, which is on the low-normal side. -Discontinue Amlodipine due to low-normal blood pressure, perhaps BP reflects recent addition of Sinemet -Monitor blood pressure at home and contact the office if it increases significantly.  Hyperlipidemia  Managed with Rosuvastatin 20mg  daily. Last LDL was 98. -Continue Rosuvastatin 20mg  daily.  Right Bundle Branch Block Stable, with QRS duration of 144 milliseconds today compared to 150 milliseconds at the last visit. -No changes to treatment plan.  Follow-up in 3 months to assess blood pressure off Amlodipine and overall cardiac status.                 Total time of encounter: 30 minutes total time of encounter, including 20 minutes spent in face-to-face patient care on the date of this encounter. This time includes coordination of care and counseling regarding above mentioned problem list. Remainder of non-face-to-face time involved reviewing chart documents/testing relevant to the patient encounter and documentation in the medical record. I have independently reviewed documentation from referring provider.   Weston Brass, MD, [facility] [place]  [facility]

## 2023-08-09 ENCOUNTER — Telehealth: Payer: Self-pay | Admitting: Diagnostic Neuroimaging

## 2023-08-09 NOTE — Telephone Encounter (Signed)
Returned call to spouse and patient, spouse reports wife's family has long history with cardiac issues and they do not want to try the nuplazid and are requesting a different medication. Advised I will let Dr. Marjory Lies know and follow back up. Husband appreciative of call.

## 2023-08-09 NOTE — Telephone Encounter (Signed)
Spouse has concerns about the side effects of Pimavanserin Tartrate (NUPLAZID) 34 MG CAPS he is asking that something else be called in for pt. Due to heart issues with pt and family, spouse would prefer another medication.  The medication shipped has not been opened, they would like to send back, please call to discuss.

## 2023-08-10 DIAGNOSIS — R058 Other specified cough: Secondary | ICD-10-CM | POA: Diagnosis not present

## 2023-08-10 DIAGNOSIS — J309 Allergic rhinitis, unspecified: Secondary | ICD-10-CM | POA: Diagnosis not present

## 2023-08-10 DIAGNOSIS — J3489 Other specified disorders of nose and nasal sinuses: Secondary | ICD-10-CM | POA: Diagnosis not present

## 2023-08-10 DIAGNOSIS — G4733 Obstructive sleep apnea (adult) (pediatric): Secondary | ICD-10-CM | POA: Diagnosis not present

## 2023-08-11 DIAGNOSIS — G20A1 Parkinson's disease without dyskinesia, without mention of fluctuations: Secondary | ICD-10-CM | POA: Diagnosis not present

## 2023-08-11 DIAGNOSIS — F02A4 Dementia in other diseases classified elsewhere, mild, with anxiety: Secondary | ICD-10-CM | POA: Diagnosis not present

## 2023-08-11 DIAGNOSIS — F02A3 Dementia in other diseases classified elsewhere, mild, with mood disturbance: Secondary | ICD-10-CM | POA: Diagnosis not present

## 2023-08-11 DIAGNOSIS — F322 Major depressive disorder, single episode, severe without psychotic features: Secondary | ICD-10-CM | POA: Diagnosis not present

## 2023-08-11 DIAGNOSIS — I129 Hypertensive chronic kidney disease with stage 1 through stage 4 chronic kidney disease, or unspecified chronic kidney disease: Secondary | ICD-10-CM | POA: Diagnosis not present

## 2023-08-11 DIAGNOSIS — G4733 Obstructive sleep apnea (adult) (pediatric): Secondary | ICD-10-CM | POA: Diagnosis not present

## 2023-08-11 DIAGNOSIS — I739 Peripheral vascular disease, unspecified: Secondary | ICD-10-CM | POA: Diagnosis not present

## 2023-08-11 DIAGNOSIS — F410 Panic disorder [episodic paroxysmal anxiety] without agoraphobia: Secondary | ICD-10-CM | POA: Diagnosis not present

## 2023-08-11 DIAGNOSIS — F02A18 Dementia in other diseases classified elsewhere, mild, with other behavioral disturbance: Secondary | ICD-10-CM | POA: Diagnosis not present

## 2023-08-11 NOTE — Telephone Encounter (Signed)
[facility]) hecking status of PA. Would need to  reach out to  [facility] [phone] to submit PA. If want to complete on CoverMyMeds portal this is the Reference key: [id number]. If have any question for [place] can call  [phone].

## 2023-08-11 NOTE — Telephone Encounter (Signed)
LVM at 12:14 pmFelica calling from [facility], the patient support program for NUPLAZID. We are assisting helping her get the medicine from her insurance. Was checking if a prior authorization has been completed and the status. Can call back with status at [phone] or id you have a printed document you can fax into [phone]

## 2023-08-12 ENCOUNTER — Other Ambulatory Visit (HOSPITAL_COMMUNITY): Payer: Self-pay

## 2023-08-12 ENCOUNTER — Telehealth: Payer: Self-pay

## 2023-08-12 NOTE — Telephone Encounter (Signed)
Pharmacy Patient Advocate Encounter   Received notification from Physician's Office that prior authorization for Nuplazid 34MG  capsules is required/requested.   Insurance verification completed.   The patient is insured through [employer] [place] .   Per test claim: PA required; PA submitted to [facility] via CoverMyMeds Key/confirmation #/EOC [id number] Status is pending

## 2023-08-12 NOTE — Telephone Encounter (Signed)
PA request has been Submitted. New Encounter created for follow up. For additional info see Pharmacy Prior Auth telephone encounter from 08/12/2023.

## 2023-08-13 DIAGNOSIS — F02A4 Dementia in other diseases classified elsewhere, mild, with anxiety: Secondary | ICD-10-CM | POA: Diagnosis not present

## 2023-08-13 DIAGNOSIS — F322 Major depressive disorder, single episode, severe without psychotic features: Secondary | ICD-10-CM | POA: Diagnosis not present

## 2023-08-13 DIAGNOSIS — G20A1 Parkinson's disease without dyskinesia, without mention of fluctuations: Secondary | ICD-10-CM | POA: Diagnosis not present

## 2023-08-13 DIAGNOSIS — F02A3 Dementia in other diseases classified elsewhere, mild, with mood disturbance: Secondary | ICD-10-CM | POA: Diagnosis not present

## 2023-08-13 DIAGNOSIS — I129 Hypertensive chronic kidney disease with stage 1 through stage 4 chronic kidney disease, or unspecified chronic kidney disease: Secondary | ICD-10-CM | POA: Diagnosis not present

## 2023-08-13 DIAGNOSIS — G4733 Obstructive sleep apnea (adult) (pediatric): Secondary | ICD-10-CM | POA: Diagnosis not present

## 2023-08-13 DIAGNOSIS — F410 Panic disorder [episodic paroxysmal anxiety] without agoraphobia: Secondary | ICD-10-CM | POA: Diagnosis not present

## 2023-08-13 DIAGNOSIS — I739 Peripheral vascular disease, unspecified: Secondary | ICD-10-CM | POA: Diagnosis not present

## 2023-08-13 DIAGNOSIS — F02A18 Dementia in other diseases classified elsewhere, mild, with other behavioral disturbance: Secondary | ICD-10-CM | POA: Diagnosis not present

## 2023-08-16 ENCOUNTER — Other Ambulatory Visit (HOSPITAL_COMMUNITY): Payer: Self-pay

## 2023-08-16 ENCOUNTER — Other Ambulatory Visit: Payer: Self-pay | Admitting: Internal Medicine

## 2023-08-16 DIAGNOSIS — Z1231 Encounter for screening mammogram for malignant neoplasm of breast: Secondary | ICD-10-CM

## 2023-08-18 DIAGNOSIS — F02A18 Dementia in other diseases classified elsewhere, mild, with other behavioral disturbance: Secondary | ICD-10-CM | POA: Diagnosis not present

## 2023-08-18 DIAGNOSIS — F02A4 Dementia in other diseases classified elsewhere, mild, with anxiety: Secondary | ICD-10-CM | POA: Diagnosis not present

## 2023-08-18 DIAGNOSIS — I129 Hypertensive chronic kidney disease with stage 1 through stage 4 chronic kidney disease, or unspecified chronic kidney disease: Secondary | ICD-10-CM | POA: Diagnosis not present

## 2023-08-18 DIAGNOSIS — G20A1 Parkinson's disease without dyskinesia, without mention of fluctuations: Secondary | ICD-10-CM | POA: Diagnosis not present

## 2023-08-18 DIAGNOSIS — I739 Peripheral vascular disease, unspecified: Secondary | ICD-10-CM | POA: Diagnosis not present

## 2023-08-18 DIAGNOSIS — F02A3 Dementia in other diseases classified elsewhere, mild, with mood disturbance: Secondary | ICD-10-CM | POA: Diagnosis not present

## 2023-08-18 DIAGNOSIS — F410 Panic disorder [episodic paroxysmal anxiety] without agoraphobia: Secondary | ICD-10-CM | POA: Diagnosis not present

## 2023-08-18 DIAGNOSIS — F322 Major depressive disorder, single episode, severe without psychotic features: Secondary | ICD-10-CM | POA: Diagnosis not present

## 2023-08-18 DIAGNOSIS — G4733 Obstructive sleep apnea (adult) (pediatric): Secondary | ICD-10-CM | POA: Diagnosis not present

## 2023-08-20 DIAGNOSIS — I739 Peripheral vascular disease, unspecified: Secondary | ICD-10-CM | POA: Diagnosis not present

## 2023-08-20 DIAGNOSIS — G20A1 Parkinson's disease without dyskinesia, without mention of fluctuations: Secondary | ICD-10-CM | POA: Diagnosis not present

## 2023-08-20 DIAGNOSIS — G4733 Obstructive sleep apnea (adult) (pediatric): Secondary | ICD-10-CM | POA: Diagnosis not present

## 2023-08-20 DIAGNOSIS — F410 Panic disorder [episodic paroxysmal anxiety] without agoraphobia: Secondary | ICD-10-CM | POA: Diagnosis not present

## 2023-08-20 DIAGNOSIS — F02A18 Dementia in other diseases classified elsewhere, mild, with other behavioral disturbance: Secondary | ICD-10-CM | POA: Diagnosis not present

## 2023-08-20 DIAGNOSIS — F322 Major depressive disorder, single episode, severe without psychotic features: Secondary | ICD-10-CM | POA: Diagnosis not present

## 2023-08-20 DIAGNOSIS — I129 Hypertensive chronic kidney disease with stage 1 through stage 4 chronic kidney disease, or unspecified chronic kidney disease: Secondary | ICD-10-CM | POA: Diagnosis not present

## 2023-08-20 DIAGNOSIS — F02A3 Dementia in other diseases classified elsewhere, mild, with mood disturbance: Secondary | ICD-10-CM | POA: Diagnosis not present

## 2023-08-20 DIAGNOSIS — F02A4 Dementia in other diseases classified elsewhere, mild, with anxiety: Secondary | ICD-10-CM | POA: Diagnosis not present

## 2023-08-20 NOTE — Telephone Encounter (Signed)
Pharmacy Patient Advocate Encounter  Received notification from [facility] that Prior Authorization for Nuplazid 34MG  capsules has been APPROVED from 10/19/2022 to 10/18/2024   PA #/Case ID/Reference #: PA Case: [id number]

## 2023-08-25 DIAGNOSIS — F322 Major depressive disorder, single episode, severe without psychotic features: Secondary | ICD-10-CM | POA: Diagnosis not present

## 2023-08-25 DIAGNOSIS — F02A3 Dementia in other diseases classified elsewhere, mild, with mood disturbance: Secondary | ICD-10-CM | POA: Diagnosis not present

## 2023-08-25 DIAGNOSIS — F02A4 Dementia in other diseases classified elsewhere, mild, with anxiety: Secondary | ICD-10-CM | POA: Diagnosis not present

## 2023-08-25 DIAGNOSIS — G20A1 Parkinson's disease without dyskinesia, without mention of fluctuations: Secondary | ICD-10-CM | POA: Diagnosis not present

## 2023-08-25 DIAGNOSIS — F410 Panic disorder [episodic paroxysmal anxiety] without agoraphobia: Secondary | ICD-10-CM | POA: Diagnosis not present

## 2023-08-25 DIAGNOSIS — G4733 Obstructive sleep apnea (adult) (pediatric): Secondary | ICD-10-CM | POA: Diagnosis not present

## 2023-08-25 DIAGNOSIS — F02A18 Dementia in other diseases classified elsewhere, mild, with other behavioral disturbance: Secondary | ICD-10-CM | POA: Diagnosis not present

## 2023-08-25 DIAGNOSIS — I129 Hypertensive chronic kidney disease with stage 1 through stage 4 chronic kidney disease, or unspecified chronic kidney disease: Secondary | ICD-10-CM | POA: Diagnosis not present

## 2023-08-25 DIAGNOSIS — I739 Peripheral vascular disease, unspecified: Secondary | ICD-10-CM | POA: Diagnosis not present

## 2023-08-26 ENCOUNTER — Telehealth: Payer: Self-pay | Admitting: Diagnostic Neuroimaging

## 2023-08-26 NOTE — Telephone Encounter (Signed)
[employer] MCR Berkley Harvey: [phone] exp. 08/26/23-10/19/23 for GI

## 2023-08-30 DIAGNOSIS — F02A18 Dementia in other diseases classified elsewhere, mild, with other behavioral disturbance: Secondary | ICD-10-CM | POA: Diagnosis not present

## 2023-08-30 DIAGNOSIS — G4733 Obstructive sleep apnea (adult) (pediatric): Secondary | ICD-10-CM | POA: Diagnosis not present

## 2023-08-30 DIAGNOSIS — G20A1 Parkinson's disease without dyskinesia, without mention of fluctuations: Secondary | ICD-10-CM | POA: Diagnosis not present

## 2023-08-30 DIAGNOSIS — I739 Peripheral vascular disease, unspecified: Secondary | ICD-10-CM | POA: Diagnosis not present

## 2023-08-30 DIAGNOSIS — F410 Panic disorder [episodic paroxysmal anxiety] without agoraphobia: Secondary | ICD-10-CM | POA: Diagnosis not present

## 2023-08-30 DIAGNOSIS — F02A3 Dementia in other diseases classified elsewhere, mild, with mood disturbance: Secondary | ICD-10-CM | POA: Diagnosis not present

## 2023-08-30 DIAGNOSIS — F02A4 Dementia in other diseases classified elsewhere, mild, with anxiety: Secondary | ICD-10-CM | POA: Diagnosis not present

## 2023-08-30 DIAGNOSIS — F322 Major depressive disorder, single episode, severe without psychotic features: Secondary | ICD-10-CM | POA: Diagnosis not present

## 2023-08-30 DIAGNOSIS — I129 Hypertensive chronic kidney disease with stage 1 through stage 4 chronic kidney disease, or unspecified chronic kidney disease: Secondary | ICD-10-CM | POA: Diagnosis not present

## 2023-08-31 DIAGNOSIS — H04123 Dry eye syndrome of bilateral lacrimal glands: Secondary | ICD-10-CM | POA: Diagnosis not present

## 2023-09-01 DIAGNOSIS — G20A1 Parkinson's disease without dyskinesia, without mention of fluctuations: Secondary | ICD-10-CM | POA: Diagnosis not present

## 2023-09-01 DIAGNOSIS — F02A18 Dementia in other diseases classified elsewhere, mild, with other behavioral disturbance: Secondary | ICD-10-CM | POA: Diagnosis not present

## 2023-09-01 DIAGNOSIS — I739 Peripheral vascular disease, unspecified: Secondary | ICD-10-CM | POA: Diagnosis not present

## 2023-09-01 DIAGNOSIS — I129 Hypertensive chronic kidney disease with stage 1 through stage 4 chronic kidney disease, or unspecified chronic kidney disease: Secondary | ICD-10-CM | POA: Diagnosis not present

## 2023-09-01 DIAGNOSIS — F02A4 Dementia in other diseases classified elsewhere, mild, with anxiety: Secondary | ICD-10-CM | POA: Diagnosis not present

## 2023-09-01 DIAGNOSIS — F02A3 Dementia in other diseases classified elsewhere, mild, with mood disturbance: Secondary | ICD-10-CM | POA: Diagnosis not present

## 2023-09-01 DIAGNOSIS — F322 Major depressive disorder, single episode, severe without psychotic features: Secondary | ICD-10-CM | POA: Diagnosis not present

## 2023-09-01 DIAGNOSIS — N189 Chronic kidney disease, unspecified: Secondary | ICD-10-CM | POA: Diagnosis not present

## 2023-09-03 DIAGNOSIS — F02A4 Dementia in other diseases classified elsewhere, mild, with anxiety: Secondary | ICD-10-CM | POA: Diagnosis not present

## 2023-09-03 DIAGNOSIS — F02A18 Dementia in other diseases classified elsewhere, mild, with other behavioral disturbance: Secondary | ICD-10-CM | POA: Diagnosis not present

## 2023-09-03 DIAGNOSIS — F02A3 Dementia in other diseases classified elsewhere, mild, with mood disturbance: Secondary | ICD-10-CM | POA: Diagnosis not present

## 2023-09-03 DIAGNOSIS — G20A1 Parkinson's disease without dyskinesia, without mention of fluctuations: Secondary | ICD-10-CM | POA: Diagnosis not present

## 2023-09-03 DIAGNOSIS — G4733 Obstructive sleep apnea (adult) (pediatric): Secondary | ICD-10-CM | POA: Diagnosis not present

## 2023-09-03 DIAGNOSIS — I129 Hypertensive chronic kidney disease with stage 1 through stage 4 chronic kidney disease, or unspecified chronic kidney disease: Secondary | ICD-10-CM | POA: Diagnosis not present

## 2023-09-03 DIAGNOSIS — F322 Major depressive disorder, single episode, severe without psychotic features: Secondary | ICD-10-CM | POA: Diagnosis not present

## 2023-09-03 DIAGNOSIS — I739 Peripheral vascular disease, unspecified: Secondary | ICD-10-CM | POA: Diagnosis not present

## 2023-09-03 DIAGNOSIS — F410 Panic disorder [episodic paroxysmal anxiety] without agoraphobia: Secondary | ICD-10-CM | POA: Diagnosis not present

## 2023-09-05 ENCOUNTER — Ambulatory Visit
Admission: RE | Admit: 2023-09-05 | Discharge: 2023-09-05 | Disposition: A | Discharge status: Home / Self Care | Payer: Medicare HMO | Source: Ambulatory Visit | Attending: Diagnostic Neuroimaging | Admitting: Diagnostic Neuroimaging

## 2023-09-05 DIAGNOSIS — G20A1 Parkinson's disease without dyskinesia, without mention of fluctuations: Secondary | ICD-10-CM | POA: Diagnosis not present

## 2023-09-05 DIAGNOSIS — F02A18 Dementia in other diseases classified elsewhere, mild, with other behavioral disturbance: Secondary | ICD-10-CM

## 2023-09-05 MED ORDER — GADOPICLENOL 0.5 MMOL/ML IV SOLN
7.0000 mL | Freq: Once | INTRAVENOUS | Status: AC | PRN
  Administered 2023-09-05: 7 mL via INTRAVENOUS

## 2023-09-06 DIAGNOSIS — G20A1 Parkinson's disease without dyskinesia, without mention of fluctuations: Secondary | ICD-10-CM | POA: Diagnosis not present

## 2023-09-07 ENCOUNTER — Other Ambulatory Visit: Payer: Medicare HMO

## 2023-09-07 DIAGNOSIS — F02A3 Dementia in other diseases classified elsewhere, mild, with mood disturbance: Secondary | ICD-10-CM | POA: Diagnosis not present

## 2023-09-07 DIAGNOSIS — G20A1 Parkinson's disease without dyskinesia, without mention of fluctuations: Secondary | ICD-10-CM | POA: Diagnosis not present

## 2023-09-07 DIAGNOSIS — F02A4 Dementia in other diseases classified elsewhere, mild, with anxiety: Secondary | ICD-10-CM | POA: Diagnosis not present

## 2023-09-07 DIAGNOSIS — I739 Peripheral vascular disease, unspecified: Secondary | ICD-10-CM | POA: Diagnosis not present

## 2023-09-07 DIAGNOSIS — F02A18 Dementia in other diseases classified elsewhere, mild, with other behavioral disturbance: Secondary | ICD-10-CM | POA: Diagnosis not present

## 2023-09-07 DIAGNOSIS — F410 Panic disorder [episodic paroxysmal anxiety] without agoraphobia: Secondary | ICD-10-CM | POA: Diagnosis not present

## 2023-09-07 DIAGNOSIS — I129 Hypertensive chronic kidney disease with stage 1 through stage 4 chronic kidney disease, or unspecified chronic kidney disease: Secondary | ICD-10-CM | POA: Diagnosis not present

## 2023-09-07 DIAGNOSIS — G4733 Obstructive sleep apnea (adult) (pediatric): Secondary | ICD-10-CM | POA: Diagnosis not present

## 2023-09-07 DIAGNOSIS — F322 Major depressive disorder, single episode, severe without psychotic features: Secondary | ICD-10-CM | POA: Diagnosis not present

## 2023-09-07 NOTE — Progress Notes (Signed)
[facility] - Clinic Note  09/09/2023     CHIEF COMPLAINT Patient presents for No chief complaint on file.   HISTORY OF PRESENT ILLNESS: Linda [name] is a 82 y.o. female who presents to the clinic today for:     Referring physician: Daisy Lazar OD [address] [place], [place] [address]  HISTORICAL INFORMATION:   Selected notes from the MEDICAL RECORD NUMBER Referred by Dr. Charise Killian for BRVO OD LEE: 11.12.24, BCVA OD: CF@1 ', OS: 20/40-2 Ocular Hx- BRVO OD, Dry Eye Syndrome, pseudophakia OU PMH- HTN    CURRENT MEDICATIONS: No current outpatient medications on file. (Ophthalmic Drugs)   No current facility-administered medications for this visit. (Ophthalmic Drugs)   Current Outpatient Medications (Other)  Medication Sig   acetaminophen (TYLENOL) 325 MG tablet Take 650 mg by mouth every 6 (six) hours as needed.   ALPRAZolam (XANAX) 0.5 MG tablet Take 0.5 mg by mouth daily.   benazepril (LOTENSIN) 40 MG tablet Take 40 mg by mouth daily.   calcium carbonate (TUMS - DOSED IN MG ELEMENTAL CALCIUM) 500 MG chewable tablet Chew 1 tablet by mouth daily.   Calcium-Vitamin D-Vitamin K (VIACTIV CALCIUM PLUS D PO) Take 2 tablets by mouth 2 (two) times daily.   carbidopa-levodopa (SINEMET IR) 25-100 MG tablet Take 1 tablet by mouth 3 (three) times daily.   desvenlafaxine (PRISTIQ) 100 MG 24 hr tablet Take 100 mg by mouth daily.    fosfomycin (MONUROL) 3 g PACK Take 3 g by mouth once.   lamoTRIgine (LAMICTAL) 150 MG tablet Take 300 mg by mouth daily.   loratadine (CLARITIN) 10 MG tablet Take 10 mg by mouth daily.   Multiple Vitamins-Minerals (ONE-A-DAY WOMENS 50+ ADVANTAGE) TABS Take 1 tablet by mouth daily.   omeprazole (PRILOSEC) 40 MG capsule TAKE ONE CAPSULE (40MG  TOTAL) BY MOUTH DAILY   ondansetron (ZOFRAN) 8 MG tablet Take 1 tablet (8 mg total) by mouth every 8 (eight) hours as needed for nausea.   Pimavanserin Tartrate (NUPLAZID) 34 MG CAPS Take 1  capsule (34 mg total) by mouth daily.   polyethylene glycol (MIRALAX) 17 g packet MiraLax   propranolol (INDERAL) 10 MG tablet Take 10 mg by mouth 2 (two) times daily.   rivastigmine (EXELON) 9.5 mg/24hr Place 9.5 mg onto the skin daily.   rosuvastatin (CRESTOR) 20 MG tablet Take 1 tablet (20 mg total) by mouth daily.   spironolactone (ALDACTONE) 25 MG tablet TAKE ONE TABLET (25MG  TOTAL) BY MOUTH DAILY   triamcinolone cream (KENALOG) 0.1 % apply to the itching areas TID prn   No current facility-administered medications for this visit. (Other)      REVIEW OF SYSTEMS:    ALLERGIES Allergies  Allergen Reactions   Amoxicillin Hives   Cefdinir Other (See Comments)   Other Other (See Comments)   Penicillin G Other (See Comments)   Abilify [Aripiprazole] Other (See Comments)    Makes pt tremble    PAST MEDICAL HISTORY Past Medical History:  Diagnosis Date   Anxiety    Candida esophagitis (HCC)    Chronic kidney disease    DDD (degenerative disc disease)    Depression    Erosive gastritis    Falls    GERD (gastroesophageal reflux disease)    Hiatal hernia    High cholesterol    History of anal fissures    Hypertension    Obesity    Osteoarthritis    Panic disorder    PONV (postoperative nausea and vomiting)  Sleep apnea    Does not need CPAP anymore   Past Surgical History:  Procedure Laterality Date   ABDOMINAL HYSTERECTOMY  1987   LSO / menorrhagia & leiomyomata   APPENDECTOMY     BREAST EXCISIONAL BIOPSY Left 1989   benign   BREAST EXCISIONAL BIOPSY Left 07/11/2020   benign   BREAST SURGERY  1989   benign tumor left breast   CHOLECYSTECTOMY  2001   TUBAL LIGATION      FAMILY HISTORY Family History  Problem Relation Age of Onset   Heart disease Mother    Lung cancer Father    Diabetes Sister    Hypertension Sister    Diabetes Brother    Heart disease Brother    Heart attack Brother    Ovarian cancer Sister    Heart disease Brother    Heart  attack Brother    Heart disease Brother    Heart attack Brother    Colon cancer Neg Hx     SOCIAL HISTORY Social History   Tobacco Use   Smoking status: Never    Passive exposure: Never   Smokeless tobacco: Never  Vaping Use   Vaping status: Never Used  Substance Use Topics   Alcohol use: No   Drug use: No         OPHTHALMIC EXAM:  Not recorded     IMAGING AND PROCEDURES  Imaging and Procedures for 09/09/2023           ASSESSMENT/PLAN:  No diagnosis found.  1.  2.  3.  Ophthalmic Meds Ordered this visit:  No orders of the defined types were placed in this encounter.      No follow-ups on file.  There are no Patient Instructions on file for this visit.   Explained the diagnoses, plan, and follow up with the patient and they expressed understanding.  Patient expressed understanding of the importance of proper follow up care.   This document serves as a record of services personally performed by Karie Chimera, MD, PhD. It was created on their behalf by Annalee Genta, COMT. The creation of this record is the provider's dictation and/or activities during the visit.  Electronically signed by: Annalee Genta, COMT 09/07/23 1:26 PM    Karie Chimera, M.D., Ph.D. Diseases & Surgery of the Retina and Vitreous [facility] @TODAY @     Abbreviations: M myopia (nearsighted); A astigmatism; H hyperopia (farsighted); P presbyopia; Mrx spectacle prescription;  CTL contact lenses; OD right eye; OS left eye; OU both eyes  XT exotropia; ET esotropia; PEK punctate epithelial keratitis; PEE punctate epithelial erosions; DES dry eye syndrome; MGD meibomian gland dysfunction; ATs artificial tears; PFAT's preservative free artificial tears; NSC nuclear sclerotic cataract; PSC posterior subcapsular cataract; ERM epi-retinal membrane; PVD posterior vitreous detachment; RD retinal detachment; DM diabetes mellitus; DR diabetic retinopathy; NPDR  non-proliferative diabetic retinopathy; PDR proliferative diabetic retinopathy; CSME clinically significant macular edema; DME diabetic macular edema; dbh dot blot hemorrhages; CWS cotton wool spot; POAG primary open angle glaucoma; C/D cup-to-disc ratio; HVF humphrey visual field; GVF goldmann visual field; OCT optical coherence tomography; IOP intraocular pressure; BRVO Branch retinal vein occlusion; CRVO central retinal vein occlusion; CRAO central retinal artery occlusion; BRAO branch retinal artery occlusion; RT retinal tear; SB scleral buckle; PPV pars plana vitrectomy; VH Vitreous hemorrhage; PRP panretinal laser photocoagulation; IVK intravitreal kenalog; VMT vitreomacular traction; MH Macular hole;  NVD neovascularization of the disc; NVE neovascularization elsewhere; AREDS age related eye  disease study; ARMD age related macular degeneration; POAG primary open angle glaucoma; EBMD epithelial/anterior basement membrane dystrophy; ACIOL anterior chamber intraocular lens; IOL intraocular lens; PCIOL posterior chamber intraocular lens; Phaco/IOL phacoemulsification with intraocular lens placement; PRK photorefractive keratectomy; LASIK laser assisted in situ keratomileusis; HTN hypertension; DM diabetes mellitus; COPD chronic obstructive pulmonary disease

## 2023-09-09 ENCOUNTER — Ambulatory Visit (INDEPENDENT_AMBULATORY_CARE_PROVIDER_SITE_OTHER): Payer: Medicare HMO | Admitting: Ophthalmology

## 2023-09-09 ENCOUNTER — Encounter (INDEPENDENT_AMBULATORY_CARE_PROVIDER_SITE_OTHER): Payer: Self-pay | Admitting: Ophthalmology

## 2023-09-09 DIAGNOSIS — G4733 Obstructive sleep apnea (adult) (pediatric): Secondary | ICD-10-CM | POA: Diagnosis not present

## 2023-09-09 DIAGNOSIS — I10 Essential (primary) hypertension: Secondary | ICD-10-CM | POA: Diagnosis not present

## 2023-09-09 DIAGNOSIS — H34831 Tributary (branch) retinal vein occlusion, right eye, with macular edema: Secondary | ICD-10-CM | POA: Diagnosis not present

## 2023-09-09 DIAGNOSIS — H35033 Hypertensive retinopathy, bilateral: Secondary | ICD-10-CM

## 2023-09-09 DIAGNOSIS — F02A4 Dementia in other diseases classified elsewhere, mild, with anxiety: Secondary | ICD-10-CM | POA: Diagnosis not present

## 2023-09-09 DIAGNOSIS — I739 Peripheral vascular disease, unspecified: Secondary | ICD-10-CM | POA: Diagnosis not present

## 2023-09-09 DIAGNOSIS — G20A1 Parkinson's disease without dyskinesia, without mention of fluctuations: Secondary | ICD-10-CM | POA: Diagnosis not present

## 2023-09-09 DIAGNOSIS — F02A18 Dementia in other diseases classified elsewhere, mild, with other behavioral disturbance: Secondary | ICD-10-CM | POA: Diagnosis not present

## 2023-09-09 DIAGNOSIS — Z961 Presence of intraocular lens: Secondary | ICD-10-CM | POA: Diagnosis not present

## 2023-09-09 DIAGNOSIS — I129 Hypertensive chronic kidney disease with stage 1 through stage 4 chronic kidney disease, or unspecified chronic kidney disease: Secondary | ICD-10-CM | POA: Diagnosis not present

## 2023-09-09 DIAGNOSIS — H3581 Retinal edema: Secondary | ICD-10-CM

## 2023-09-09 DIAGNOSIS — F02A3 Dementia in other diseases classified elsewhere, mild, with mood disturbance: Secondary | ICD-10-CM | POA: Diagnosis not present

## 2023-09-09 DIAGNOSIS — F322 Major depressive disorder, single episode, severe without psychotic features: Secondary | ICD-10-CM | POA: Diagnosis not present

## 2023-09-09 DIAGNOSIS — F410 Panic disorder [episodic paroxysmal anxiety] without agoraphobia: Secondary | ICD-10-CM | POA: Diagnosis not present

## 2023-09-09 MED ORDER — BEVACIZUMAB CHEMO INJECTION 1.25MG/0.05ML SYRINGE FOR KALEIDOSCOPE
1.2500 mg | INTRAVITREAL | Status: AC | PRN
  Administered 2023-09-09: 1.25 mg via INTRAVITREAL

## 2023-09-13 DIAGNOSIS — I739 Peripheral vascular disease, unspecified: Secondary | ICD-10-CM | POA: Diagnosis not present

## 2023-09-13 DIAGNOSIS — F02A4 Dementia in other diseases classified elsewhere, mild, with anxiety: Secondary | ICD-10-CM | POA: Diagnosis not present

## 2023-09-13 DIAGNOSIS — G20A1 Parkinson's disease without dyskinesia, without mention of fluctuations: Secondary | ICD-10-CM | POA: Diagnosis not present

## 2023-09-13 DIAGNOSIS — F02A3 Dementia in other diseases classified elsewhere, mild, with mood disturbance: Secondary | ICD-10-CM | POA: Diagnosis not present

## 2023-09-13 DIAGNOSIS — G4733 Obstructive sleep apnea (adult) (pediatric): Secondary | ICD-10-CM | POA: Diagnosis not present

## 2023-09-13 DIAGNOSIS — I129 Hypertensive chronic kidney disease with stage 1 through stage 4 chronic kidney disease, or unspecified chronic kidney disease: Secondary | ICD-10-CM | POA: Diagnosis not present

## 2023-09-13 DIAGNOSIS — F410 Panic disorder [episodic paroxysmal anxiety] without agoraphobia: Secondary | ICD-10-CM | POA: Diagnosis not present

## 2023-09-13 DIAGNOSIS — F02A18 Dementia in other diseases classified elsewhere, mild, with other behavioral disturbance: Secondary | ICD-10-CM | POA: Diagnosis not present

## 2023-09-13 DIAGNOSIS — F322 Major depressive disorder, single episode, severe without psychotic features: Secondary | ICD-10-CM | POA: Diagnosis not present

## 2023-09-14 ENCOUNTER — Ambulatory Visit
Admission: RE | Admit: 2023-09-14 | Discharge: 2023-09-14 | Disposition: A | Discharge status: Home / Self Care | Payer: Medicare HMO | Source: Ambulatory Visit | Attending: Internal Medicine | Admitting: Internal Medicine

## 2023-09-14 DIAGNOSIS — Z1231 Encounter for screening mammogram for malignant neoplasm of breast: Secondary | ICD-10-CM

## 2023-09-15 DIAGNOSIS — F322 Major depressive disorder, single episode, severe without psychotic features: Secondary | ICD-10-CM | POA: Diagnosis not present

## 2023-09-15 DIAGNOSIS — F410 Panic disorder [episodic paroxysmal anxiety] without agoraphobia: Secondary | ICD-10-CM | POA: Diagnosis not present

## 2023-09-15 DIAGNOSIS — F02A4 Dementia in other diseases classified elsewhere, mild, with anxiety: Secondary | ICD-10-CM | POA: Diagnosis not present

## 2023-09-15 DIAGNOSIS — G20A1 Parkinson's disease without dyskinesia, without mention of fluctuations: Secondary | ICD-10-CM | POA: Diagnosis not present

## 2023-09-15 DIAGNOSIS — F02A3 Dementia in other diseases classified elsewhere, mild, with mood disturbance: Secondary | ICD-10-CM | POA: Diagnosis not present

## 2023-09-15 DIAGNOSIS — I129 Hypertensive chronic kidney disease with stage 1 through stage 4 chronic kidney disease, or unspecified chronic kidney disease: Secondary | ICD-10-CM | POA: Diagnosis not present

## 2023-09-15 DIAGNOSIS — I739 Peripheral vascular disease, unspecified: Secondary | ICD-10-CM | POA: Diagnosis not present

## 2023-09-15 DIAGNOSIS — G4733 Obstructive sleep apnea (adult) (pediatric): Secondary | ICD-10-CM | POA: Diagnosis not present

## 2023-09-15 DIAGNOSIS — F02A18 Dementia in other diseases classified elsewhere, mild, with other behavioral disturbance: Secondary | ICD-10-CM | POA: Diagnosis not present

## 2023-09-20 DIAGNOSIS — F02A4 Dementia in other diseases classified elsewhere, mild, with anxiety: Secondary | ICD-10-CM | POA: Diagnosis not present

## 2023-09-20 DIAGNOSIS — G20A1 Parkinson's disease without dyskinesia, without mention of fluctuations: Secondary | ICD-10-CM | POA: Diagnosis not present

## 2023-09-20 DIAGNOSIS — I129 Hypertensive chronic kidney disease with stage 1 through stage 4 chronic kidney disease, or unspecified chronic kidney disease: Secondary | ICD-10-CM | POA: Diagnosis not present

## 2023-09-20 DIAGNOSIS — F410 Panic disorder [episodic paroxysmal anxiety] without agoraphobia: Secondary | ICD-10-CM | POA: Diagnosis not present

## 2023-09-20 DIAGNOSIS — I739 Peripheral vascular disease, unspecified: Secondary | ICD-10-CM | POA: Diagnosis not present

## 2023-09-20 DIAGNOSIS — F322 Major depressive disorder, single episode, severe without psychotic features: Secondary | ICD-10-CM | POA: Diagnosis not present

## 2023-09-20 DIAGNOSIS — F02A18 Dementia in other diseases classified elsewhere, mild, with other behavioral disturbance: Secondary | ICD-10-CM | POA: Diagnosis not present

## 2023-09-20 DIAGNOSIS — G4733 Obstructive sleep apnea (adult) (pediatric): Secondary | ICD-10-CM | POA: Diagnosis not present

## 2023-09-20 DIAGNOSIS — F02A3 Dementia in other diseases classified elsewhere, mild, with mood disturbance: Secondary | ICD-10-CM | POA: Diagnosis not present

## 2023-09-22 DIAGNOSIS — I129 Hypertensive chronic kidney disease with stage 1 through stage 4 chronic kidney disease, or unspecified chronic kidney disease: Secondary | ICD-10-CM | POA: Diagnosis not present

## 2023-09-22 DIAGNOSIS — F410 Panic disorder [episodic paroxysmal anxiety] without agoraphobia: Secondary | ICD-10-CM | POA: Diagnosis not present

## 2023-09-22 DIAGNOSIS — F02A4 Dementia in other diseases classified elsewhere, mild, with anxiety: Secondary | ICD-10-CM | POA: Diagnosis not present

## 2023-09-22 DIAGNOSIS — F02A3 Dementia in other diseases classified elsewhere, mild, with mood disturbance: Secondary | ICD-10-CM | POA: Diagnosis not present

## 2023-09-22 DIAGNOSIS — F322 Major depressive disorder, single episode, severe without psychotic features: Secondary | ICD-10-CM | POA: Diagnosis not present

## 2023-09-22 DIAGNOSIS — G20A1 Parkinson's disease without dyskinesia, without mention of fluctuations: Secondary | ICD-10-CM | POA: Diagnosis not present

## 2023-09-22 DIAGNOSIS — G4733 Obstructive sleep apnea (adult) (pediatric): Secondary | ICD-10-CM | POA: Diagnosis not present

## 2023-09-22 DIAGNOSIS — F02A18 Dementia in other diseases classified elsewhere, mild, with other behavioral disturbance: Secondary | ICD-10-CM | POA: Diagnosis not present

## 2023-09-22 DIAGNOSIS — I739 Peripheral vascular disease, unspecified: Secondary | ICD-10-CM | POA: Diagnosis not present

## 2023-09-23 ENCOUNTER — Encounter: Payer: Self-pay | Admitting: Physician Assistant

## 2023-09-23 ENCOUNTER — Ambulatory Visit: Payer: Medicare HMO | Admitting: Physician Assistant

## 2023-09-23 ENCOUNTER — Other Ambulatory Visit: Payer: Medicare HMO

## 2023-09-23 ENCOUNTER — Other Ambulatory Visit (INDEPENDENT_AMBULATORY_CARE_PROVIDER_SITE_OTHER): Payer: Medicare HMO

## 2023-09-23 DIAGNOSIS — M1712 Unilateral primary osteoarthritis, left knee: Secondary | ICD-10-CM

## 2023-09-23 DIAGNOSIS — M1711 Unilateral primary osteoarthritis, right knee: Secondary | ICD-10-CM | POA: Diagnosis not present

## 2023-09-23 DIAGNOSIS — M170 Bilateral primary osteoarthritis of knee: Secondary | ICD-10-CM | POA: Diagnosis not present

## 2023-09-23 MED ORDER — METHYLPREDNISOLONE ACETATE 40 MG/ML IJ SUSP
40.0000 mg | INTRAMUSCULAR | Status: AC | PRN
  Administered 2023-09-23: 40 mg via INTRA_ARTICULAR

## 2023-09-23 MED ORDER — LIDOCAINE HCL 1 % IJ SOLN
3.0000 mL | INTRAMUSCULAR | Status: AC | PRN
  Administered 2023-09-23: 3 mL

## 2023-09-23 NOTE — Progress Notes (Signed)
Office Visit Note   Patient: Linda [name]           Date of Birth: Dec 29, 1940           MRN: [id number] Visit Date: 09/23/2023              Requested by: Linda Ran, MD [address] [place],  [place] [address] PCP: Linda Ran, MD   Assessment & Plan: Visit Diagnoses:  1. Primary osteoarthritis of right knee   2. Primary osteoarthritis of left knee     Plan:  She and her husband know to wait at least 3 months between cortisone injections.  Questions were encouraged and answered. Follow-Up Instructions: Return in about 3 months (around 12/22/2023).   Orders:  Orders Placed This Encounter  Procedures   Large Joint Inj   XR Knee 1-2 Views Right   XR Knee 1-2 Views Left   No orders of the defined types were placed in this encounter.     Procedures: Large Joint Inj: bilateral knee on 09/23/2023 2:35 PM Indications: pain Details: 22 G 1.5 in needle, anterolateral approach  Arthrogram: No  Medications (Right): 3 mL lidocaine 1 %; 40 mg methylPREDNISolone acetate 40 MG/ML Medications (Left): 3 mL lidocaine 1 %; 40 mg methylPREDNISolone acetate 40 MG/ML Outcome: tolerated well, no immediate complications Procedure, treatment alternatives, risks and benefits explained, specific risks discussed. Consent was given by the patient. Immediately prior to procedure a time out was called to verify the correct patient, procedure, equipment, support staff and site/side marked as required. Patient was prepped and draped in the usual sterile fashion.       Clinical Data: No additional findings.   Subjective: Chief Complaint  Patient presents with   Right Knee - Injections   Left Knee - Injections    HPI Linda [name] comes in today with her husband for bilateral knee injections.  She has known osteoarthritis both knees.  She and he both state that she has fallen several times since she was last seen.  Husband states that she has been diagnosed with dementia and  Parkinson's.  Review of Systems  Constitutional:  Negative for chills and fever.     Objective: Vital Signs: There were no vitals taken for this visit.  Physical Exam Constitutional:      Appearance: She is not ill-appearing or diaphoretic.  Pulmonary:     Effort: Pulmonary effort is normal.  Psychiatric:        Mood and Affect: Mood normal.     Ortho Exam Bilateral knees: Full extension full flexion.  Right knee tenderness peripatellar region.  Ecchymosis bilateral anterior knees.  No instability valgus varus stress in either knee able to do straight leg raise bilaterally.  No abnormal warmth erythema or effusion of either knee. Specialty Comments:  No specialty comments available.  Imaging: XR Knee 1-2 Views Right  Result Date: 09/23/2023 Right knee 2 views: No acute fractures.  Medial lateral mild joint changes.  Significant patellofemoral arthritic changes.  No acute fractures acute findings.  XR Knee 1-2 Views Left  Result Date: 09/23/2023 Left knee 2 views: No acute fracture.  Knee is well located.  Mild medial and lateral compartmental arthritic changes.  Moderately severe patellofemoral changes.    PMFS History: Patient Active Problem List   Diagnosis Date Noted   Allergic rhinitis 12/15/2021   Bradycardia 12/15/2021   Pain in right knee 02/17/2019   Cardiac murmur, unspecified 08/08/2018   Acute right-sided low back pain with right-sided  sciatica 06/13/2018   Recurrent falls 06/10/2018   Recurrent major depression (HCC) 05/14/2016   Constipation 04/10/2015   Palpitations 02/15/2014   Hypertension    High cholesterol    Anxiety    Proteinuria 09/04/2010   Right bundle branch block 08/23/2009   Obstructive sleep apnea syndrome 07/05/2009   Gastro-esophageal reflux disease without esophagitis 07/05/2009   Past Medical History:  Diagnosis Date   Anxiety    Candida esophagitis (HCC)    Chronic kidney disease    DDD (degenerative disc disease)     Depression    Erosive gastritis    Falls    GERD (gastroesophageal reflux disease)    Hiatal hernia    High cholesterol    History of anal fissures    Hypertension    Obesity    Osteoarthritis    Panic disorder    PONV (postoperative nausea and vomiting)    Sleep apnea    Does not need CPAP anymore    Family History  Problem Relation Age of Onset   Heart disease Mother    Lung cancer Father    Diabetes Sister    Hypertension Sister    Ovarian cancer Sister    Diabetes Brother    Heart disease Brother    Heart attack Brother    Heart disease Brother    Heart attack Brother    Heart disease Brother    Heart attack Brother    Colon cancer Neg Hx    BRCA 1/2 Neg Hx    Breast cancer Neg Hx     Past Surgical History:  Procedure Laterality Date   ABDOMINAL HYSTERECTOMY  1987   LSO / menorrhagia & leiomyomata   APPENDECTOMY     BREAST EXCISIONAL BIOPSY Left 1989   benign   BREAST EXCISIONAL BIOPSY Left 07/11/2020   benign   BREAST SURGERY  1989   benign tumor left breast   CHOLECYSTECTOMY  2001   TUBAL LIGATION     Social History   Occupational History   Occupation: Retired   Tobacco Use   Smoking status: Never    Passive exposure: Never   Smokeless tobacco: Never  Vaping Use   Vaping status: Never Used  Substance and Sexual Activity   Alcohol use: No   Drug use: No   Sexual activity: Yes    Birth control/protection: None

## 2023-09-27 DIAGNOSIS — F02A18 Dementia in other diseases classified elsewhere, mild, with other behavioral disturbance: Secondary | ICD-10-CM | POA: Diagnosis not present

## 2023-09-27 DIAGNOSIS — F410 Panic disorder [episodic paroxysmal anxiety] without agoraphobia: Secondary | ICD-10-CM | POA: Diagnosis not present

## 2023-09-27 DIAGNOSIS — F02A3 Dementia in other diseases classified elsewhere, mild, with mood disturbance: Secondary | ICD-10-CM | POA: Diagnosis not present

## 2023-09-27 DIAGNOSIS — I739 Peripheral vascular disease, unspecified: Secondary | ICD-10-CM | POA: Diagnosis not present

## 2023-09-27 DIAGNOSIS — G4733 Obstructive sleep apnea (adult) (pediatric): Secondary | ICD-10-CM | POA: Diagnosis not present

## 2023-09-27 DIAGNOSIS — F02A4 Dementia in other diseases classified elsewhere, mild, with anxiety: Secondary | ICD-10-CM | POA: Diagnosis not present

## 2023-09-27 DIAGNOSIS — F322 Major depressive disorder, single episode, severe without psychotic features: Secondary | ICD-10-CM | POA: Diagnosis not present

## 2023-09-27 DIAGNOSIS — G20A1 Parkinson's disease without dyskinesia, without mention of fluctuations: Secondary | ICD-10-CM | POA: Diagnosis not present

## 2023-09-27 DIAGNOSIS — I129 Hypertensive chronic kidney disease with stage 1 through stage 4 chronic kidney disease, or unspecified chronic kidney disease: Secondary | ICD-10-CM | POA: Diagnosis not present

## 2023-10-01 DIAGNOSIS — F410 Panic disorder [episodic paroxysmal anxiety] without agoraphobia: Secondary | ICD-10-CM | POA: Diagnosis not present

## 2023-10-01 DIAGNOSIS — I129 Hypertensive chronic kidney disease with stage 1 through stage 4 chronic kidney disease, or unspecified chronic kidney disease: Secondary | ICD-10-CM | POA: Diagnosis not present

## 2023-10-01 DIAGNOSIS — F02A3 Dementia in other diseases classified elsewhere, mild, with mood disturbance: Secondary | ICD-10-CM | POA: Diagnosis not present

## 2023-10-01 DIAGNOSIS — F02A18 Dementia in other diseases classified elsewhere, mild, with other behavioral disturbance: Secondary | ICD-10-CM | POA: Diagnosis not present

## 2023-10-01 DIAGNOSIS — G20A1 Parkinson's disease without dyskinesia, without mention of fluctuations: Secondary | ICD-10-CM | POA: Diagnosis not present

## 2023-10-01 DIAGNOSIS — F322 Major depressive disorder, single episode, severe without psychotic features: Secondary | ICD-10-CM | POA: Diagnosis not present

## 2023-10-01 DIAGNOSIS — G4733 Obstructive sleep apnea (adult) (pediatric): Secondary | ICD-10-CM | POA: Diagnosis not present

## 2023-10-01 DIAGNOSIS — F02A4 Dementia in other diseases classified elsewhere, mild, with anxiety: Secondary | ICD-10-CM | POA: Diagnosis not present

## 2023-10-01 DIAGNOSIS — I739 Peripheral vascular disease, unspecified: Secondary | ICD-10-CM | POA: Diagnosis not present

## 2023-10-06 NOTE — Progress Notes (Signed)
[facility] - Clinic Note  10/07/2023   CHIEF COMPLAINT Patient presents for Retina Follow Up  HISTORY OF PRESENT ILLNESS: Linda [name] is a 82 y.o. female who presents to the clinic today for:  HPI     Retina Follow Up   Patient presents with  CRVO/BRVO.  In right eye.  I, the attending physician,  performed the HPI with the patient and updated documentation appropriately.        Comments   Patient is complaining of a discharge in the morning from the left eye. The vision is the same. She is using Systane OU PRN.       Last edited by Rennis Chris, MD on 10/07/2023 12:42 PM.    Pt states vision has improved, she did not have any problems with her first injection   Referring physician: Rodrigo Ran, MD [address] [place],  [place] [address]  HISTORICAL INFORMATION:  Selected notes from the MEDICAL RECORD NUMBER Referred by Dr. Charise Killian for BRVO OD LEE: 11.12.24, BCVA OD: CF@1 ', OS: 20/40-2 Ocular Hx- BRVO OD, Dry Eye Syndrome, pseudophakia OU PMH- HTN   CURRENT MEDICATIONS: No current outpatient medications on file. (Ophthalmic Drugs)   No current facility-administered medications for this visit. (Ophthalmic Drugs)   Current Outpatient Medications (Other)  Medication Sig   acetaminophen (TYLENOL) 325 MG tablet Take 650 mg by mouth every 6 (six) hours as needed.   ALPRAZolam (XANAX) 0.5 MG tablet Take 0.5 mg by mouth daily.   benazepril (LOTENSIN) 40 MG tablet Take 40 mg by mouth daily.   calcium carbonate (TUMS - DOSED IN MG ELEMENTAL CALCIUM) 500 MG chewable tablet Chew 1 tablet by mouth daily.   Calcium-Vitamin D-Vitamin K (VIACTIV CALCIUM PLUS D PO) Take 2 tablets by mouth 2 (two) times daily.   carbidopa-levodopa (SINEMET IR) 25-100 MG tablet Take 1 tablet by mouth 3 (three) times daily.   desvenlafaxine (PRISTIQ) 100 MG 24 hr tablet Take 100 mg by mouth daily.    fosfomycin (MONUROL) 3 g PACK Take 3 g by mouth once.   lamoTRIgine  (LAMICTAL) 150 MG tablet Take 300 mg by mouth daily.   loratadine (CLARITIN) 10 MG tablet Take 10 mg by mouth daily.   Multiple Vitamins-Minerals (ONE-A-DAY WOMENS 50+ ADVANTAGE) TABS Take 1 tablet by mouth daily.   omeprazole (PRILOSEC) 40 MG capsule TAKE ONE CAPSULE (40MG  TOTAL) BY MOUTH DAILY   ondansetron (ZOFRAN) 8 MG tablet Take 1 tablet (8 mg total) by mouth every 8 (eight) hours as needed for nausea.   Pimavanserin Tartrate (NUPLAZID) 34 MG CAPS Take 1 capsule (34 mg total) by mouth daily.   polyethylene glycol (MIRALAX) 17 g packet MiraLax   propranolol (INDERAL) 10 MG tablet Take 10 mg by mouth 2 (two) times daily.   rivastigmine (EXELON) 9.5 mg/24hr Place 9.5 mg onto the skin daily.   rosuvastatin (CRESTOR) 20 MG tablet Take 1 tablet (20 mg total) by mouth daily.   spironolactone (ALDACTONE) 25 MG tablet TAKE ONE TABLET (25MG  TOTAL) BY MOUTH DAILY   triamcinolone cream (KENALOG) 0.1 % apply to the itching areas TID prn   No current facility-administered medications for this visit. (Other)   REVIEW OF SYSTEMS: ROS   Positive for: Neurological, Musculoskeletal, Eyes Negative for: Cardiovascular Last edited by Charlette Caffey, COT on 10/07/2023  8:00 AM.      ALLERGIES Allergies  Allergen Reactions   Amoxicillin Hives   Cefdinir Other (See Comments)   Other Other (See  Comments)   Penicillin G Other (See Comments)   Abilify [Aripiprazole] Other (See Comments)    Makes pt tremble   PAST MEDICAL HISTORY Past Medical History:  Diagnosis Date   Anxiety    Candida esophagitis (HCC)    Chronic kidney disease    DDD (degenerative disc disease)    Depression    Erosive gastritis    Falls    GERD (gastroesophageal reflux disease)    Hiatal hernia    High cholesterol    History of anal fissures    Hypertension    Obesity    Osteoarthritis    Panic disorder    PONV (postoperative nausea and vomiting)    Sleep apnea    Does not need CPAP anymore   Past Surgical  History:  Procedure Laterality Date   ABDOMINAL HYSTERECTOMY  1987   LSO / menorrhagia & leiomyomata   APPENDECTOMY     BREAST EXCISIONAL BIOPSY Left 1989   benign   BREAST EXCISIONAL BIOPSY Left 07/11/2020   benign   BREAST SURGERY  1989   benign tumor left breast   CHOLECYSTECTOMY  2001   TUBAL LIGATION     FAMILY HISTORY Family History  Problem Relation Age of Onset   Heart disease Mother    Lung cancer Father    Diabetes Sister    Hypertension Sister    Ovarian cancer Sister    Diabetes Brother    Heart disease Brother    Heart attack Brother    Heart disease Brother    Heart attack Brother    Heart disease Brother    Heart attack Brother    Colon cancer Neg Hx    BRCA 1/2 Neg Hx    Breast cancer Neg Hx    SOCIAL HISTORY Social History   Tobacco Use   Smoking status: Never    Passive exposure: Never   Smokeless tobacco: Never  Vaping Use   Vaping status: Never Used  Substance Use Topics   Alcohol use: No   Drug use: No       OPHTHALMIC EXAM:  Base Eye Exam     Visual Acuity (Snellen - Linear)       Right Left   Dist cc 20/300 20/40   Dist ph cc 20/100 NI    Correction: Glasses         Tonometry (Tonopen, 8:07 AM)       Right Left   Pressure 17 16         Pupils       Dark Light Shape React APD   Right 4 3 Round Brisk None   Left 4 3 Round Brisk +         Visual Fields       Left Right    Full Full         Extraocular Movement       Right Left    Full, Ortho Full, Ortho         Neuro/Psych     Oriented x3: Yes   Mood/Affect: Normal         Dilation     Both eyes: 2.5% Phenylephrine @ 8:01 AM           Slit Lamp and Fundus Exam     Slit Lamp Exam       Right Left   Lids/Lashes Dermatochalasis - upper lid, mild MGD Dermatochalasis - upper lid, mild MGD   Conjunctiva/Sclera White and quiet White and  quiet   Cornea well healed cataract wound, tear film debris well healed cataract wound, tear film  debris   Anterior Chamber deep and clear deep and clear   Iris Round and dilated Round and dilated   Lens PC IOL in good position PC IOL in good position   Anterior Vitreous  Posterior vitreous detachment         Fundus Exam       Right Left   Disc Pink and Sharp, mild tilt, temporal PPA mild Pallor, mild tilt, Compact, PPA   C/D Ratio 0.2 0.2   Macula Blunted foveal reflex, massive central edema -- improved, confluent IRH / flame hemes superior macula -- improving Flat, Blunted foveal reflex, RPE mottling, No heme or edema   Vessels attenuated, Tortuous, superior BRVO attenuated, Tortuous   Periphery Attached, +DBH superior quadrant, peripheral drusen Attached, mild reticular degeneration, No heme           Refraction     Wearing Rx       Sphere Cylinder Axis Add   Right -0.50 +0.25 085 +2.50   Left -1.50 +0.75 055 +2.50           IMAGING AND PROCEDURES  Imaging and Procedures for 10/07/2023  OCT, Retina - OU - Both Eyes       Right Eye Quality was good. Central Foveal Thickness: 447. Progression has improved. Findings include no SRF, abnormal foveal contour, intraretinal hyper-reflective material, epiretinal membrane, intraretinal fluid (Interval improvement in massive central edema and foveal contour, partial PVD).   Left Eye Quality was good. Central Foveal Thickness: 315. Progression has been stable. Findings include normal foveal contour, no IRF, no SRF.   Notes *Images captured and stored on drive  Diagnosis / Impression:  OD: Interval improvement in massive central edema and foveal contour, partial PVD OS: NFP, no IRF/SRF  Clinical management:  See below  Abbreviations: NFP - Normal foveal profile. CME - cystoid macular edema. PED - pigment epithelial detachment. IRF - intraretinal fluid. SRF - subretinal fluid. EZ - ellipsoid zone. ERM - epiretinal membrane. ORA - outer retinal atrophy. ORT - outer retinal tubulation. SRHM - subretinal  hyper-reflective material. IRHM - intraretinal hyper-reflective material      Intravitreal Injection, Pharmacologic Agent - OD - Right Eye       Time Out 10/07/2023. 9:09 AM. Confirmed correct patient, procedure, site, and patient consented.   Anesthesia Topical anesthesia was used. Anesthetic medications included Lidocaine 2%, Proparacaine 0.5%.   Procedure Preparation included 5% betadine to ocular surface, eyelid speculum. A (32g) needle was used.   Injection: 1.25 mg Bevacizumab 1.25mg /0.60ml   Route: Intravitreal, Site: Right Eye   NDC: P3213405, Lot: [id number], Expiration date: 12/27/2023   Post-op Post injection exam found visual acuity of at least counting fingers. The patient tolerated the procedure well. There were no complications. The patient received written and verbal post procedure care education. Post injection medications were not given.            ASSESSMENT/PLAN:   ICD-10-CM   1. Branch retinal vein occlusion of right eye with macular edema  H34.8310 OCT, Retina - OU - Both Eyes    Intravitreal Injection, Pharmacologic Agent - OD - Right Eye    Bevacizumab (AVASTIN) SOLN 1.25 mg    2. Essential hypertension  I10     3. Hypertensive retinopathy of both eyes  H35.033     4. Pseudophakia, both eyes  Z96.1       BRVO w/  CME OD  - s/p IVA OD #1 (11.21.24) - BCVA OD 20/100 from 20/200 - exam shows confluent IRH superior macula w/ extension of DBH to superior periphery -- improving - OCT shows interval improvement in massive central edema and foveal contour, partial PVD - recommend IVA OD #2 today, 12.19.24 - pt wishes to proceed with injection - RBA of procedure discussed, questions answered - IVA informed consent obtained and signed, 11.21.24 (OD) - see procedure note - F/U 4 weeks -- DFE/OCT/possible injection  2,3. Hypertensive retinopathy OU - discussed importance of tight BP control - monitor  4. Pseudophakia OU  - s/p CE/IOL OU (March  2023; Dr. June Leap)  - IOL in good position, doing well  - monitor  Ophthalmic Meds Ordered this visit:  Meds ordered this encounter  Medications   Bevacizumab (AVASTIN) SOLN 1.25 mg     Return in about 4 weeks (around 11/04/2023) for f/u BRVO OD, DFE, OCT, Possible Injxn.  There are no Patient Instructions on file for this visit.  This document serves as a record of services personally performed by Karie Chimera, MD, PhD. It was created on their behalf by Annalee Genta, COMT. The creation of this record is the provider's dictation and/or activities during the visit.  Electronically signed by: Annalee Genta, COMT 10/07/23 12:43 PM  This document serves as a record of services personally performed by Karie Chimera, MD, PhD. It was created on their behalf by Glee Arvin. Manson Passey, OA an ophthalmic technician. The creation of this record is the provider's dictation and/or activities during the visit.    Electronically signed by: Glee Arvin. Manson Passey, OA 10/07/23 12:43 PM  Karie Chimera, M.D., Ph.D. Diseases & Surgery of the Retina and Vitreous [facility] 10/07/2023  I have reviewed the above documentation for accuracy and completeness, and I agree with the above. Karie Chimera, M.D., Ph.D. 10/07/23 12:44 PM  Abbreviations: M myopia (nearsighted); A astigmatism; H hyperopia (farsighted); P presbyopia; Mrx spectacle prescription;  CTL contact lenses; OD right eye; OS left eye; OU both eyes  XT exotropia; ET esotropia; PEK punctate epithelial keratitis; PEE punctate epithelial erosions; DES dry eye syndrome; MGD meibomian gland dysfunction; ATs artificial tears; PFAT's preservative free artificial tears; NSC nuclear sclerotic cataract; PSC posterior subcapsular cataract; ERM epi-retinal membrane; PVD posterior vitreous detachment; RD retinal detachment; DM diabetes mellitus; DR diabetic retinopathy; NPDR non-proliferative diabetic retinopathy; PDR proliferative diabetic  retinopathy; CSME clinically significant macular edema; DME diabetic macular edema; dbh dot blot hemorrhages; CWS cotton wool spot; POAG primary open angle glaucoma; C/D cup-to-disc ratio; HVF humphrey visual field; GVF goldmann visual field; OCT optical coherence tomography; IOP intraocular pressure; BRVO Branch retinal vein occlusion; CRVO central retinal vein occlusion; CRAO central retinal artery occlusion; BRAO branch retinal artery occlusion; RT retinal tear; SB scleral buckle; PPV pars plana vitrectomy; VH Vitreous hemorrhage; PRP panretinal laser photocoagulation; IVK intravitreal kenalog; VMT vitreomacular traction; MH Macular hole;  NVD neovascularization of the disc; NVE neovascularization elsewhere; AREDS age related eye disease study; ARMD age related macular degeneration; POAG primary open angle glaucoma; EBMD epithelial/anterior basement membrane dystrophy; ACIOL anterior chamber intraocular lens; IOL intraocular lens; PCIOL posterior chamber intraocular lens; Phaco/IOL phacoemulsification with intraocular lens placement; PRK photorefractive keratectomy; LASIK laser assisted in situ keratomileusis; HTN hypertension; DM diabetes mellitus; COPD chronic obstructive pulmonary disease

## 2023-10-07 ENCOUNTER — Encounter (INDEPENDENT_AMBULATORY_CARE_PROVIDER_SITE_OTHER): Payer: Self-pay | Admitting: Ophthalmology

## 2023-10-07 ENCOUNTER — Ambulatory Visit (INDEPENDENT_AMBULATORY_CARE_PROVIDER_SITE_OTHER): Payer: Medicare HMO | Admitting: Ophthalmology

## 2023-10-07 VITALS — BP 156/77 | HR 60

## 2023-10-07 DIAGNOSIS — H35033 Hypertensive retinopathy, bilateral: Secondary | ICD-10-CM

## 2023-10-07 DIAGNOSIS — Z961 Presence of intraocular lens: Secondary | ICD-10-CM

## 2023-10-07 DIAGNOSIS — H34831 Tributary (branch) retinal vein occlusion, right eye, with macular edema: Secondary | ICD-10-CM

## 2023-10-07 DIAGNOSIS — I10 Essential (primary) hypertension: Secondary | ICD-10-CM

## 2023-10-07 MED ORDER — BEVACIZUMAB CHEMO INJECTION 1.25MG/0.05ML SYRINGE FOR KALEIDOSCOPE
1.2500 mg | INTRAVITREAL | Status: AC | PRN
  Administered 2023-10-07: 1.25 mg via INTRAVITREAL

## 2023-10-14 DIAGNOSIS — I10 Essential (primary) hypertension: Secondary | ICD-10-CM | POA: Diagnosis not present

## 2023-10-14 DIAGNOSIS — E785 Hyperlipidemia, unspecified: Secondary | ICD-10-CM | POA: Diagnosis not present

## 2023-10-21 ENCOUNTER — Other Ambulatory Visit: Payer: Self-pay | Admitting: Gastroenterology

## 2023-10-21 DIAGNOSIS — K219 Gastro-esophageal reflux disease without esophagitis: Secondary | ICD-10-CM

## 2023-10-26 DIAGNOSIS — I10 Essential (primary) hypertension: Secondary | ICD-10-CM | POA: Diagnosis not present

## 2023-10-26 DIAGNOSIS — M6281 Muscle weakness (generalized): Secondary | ICD-10-CM | POA: Diagnosis not present

## 2023-10-26 DIAGNOSIS — R413 Other amnesia: Secondary | ICD-10-CM | POA: Diagnosis not present

## 2023-10-26 DIAGNOSIS — F02B3 Dementia in other diseases classified elsewhere, moderate, with mood disturbance: Secondary | ICD-10-CM | POA: Diagnosis not present

## 2023-10-26 DIAGNOSIS — G20A1 Parkinson's disease without dyskinesia, without mention of fluctuations: Secondary | ICD-10-CM | POA: Diagnosis not present

## 2023-10-26 DIAGNOSIS — F322 Major depressive disorder, single episode, severe without psychotic features: Secondary | ICD-10-CM | POA: Diagnosis not present

## 2023-10-28 DIAGNOSIS — Z85828 Personal history of other malignant neoplasm of skin: Secondary | ICD-10-CM | POA: Diagnosis not present

## 2023-10-28 DIAGNOSIS — Z08 Encounter for follow-up examination after completed treatment for malignant neoplasm: Secondary | ICD-10-CM | POA: Diagnosis not present

## 2023-10-28 DIAGNOSIS — L821 Other seborrheic keratosis: Secondary | ICD-10-CM | POA: Diagnosis not present

## 2023-11-03 NOTE — Progress Notes (Signed)
[facility] - Clinic Note  11/04/2023   CHIEF COMPLAINT Patient presents for Retina Follow Up  HISTORY OF PRESENT ILLNESS: Linda [name] is a 83 y.o. female who presents to the clinic today for:  HPI     Retina Follow Up   Patient presents with  CRVO/BRVO.  In right eye.  This started 4 weeks ago.  Duration of 4 weeks.  I, the attending physician,  performed the HPI with the patient and updated documentation appropriately.        Comments   Patient feels the vision is the same. She is using AT's.       Last edited by Rennis Chris, MD on 11/04/2023 11:27 AM.      Referring physician: Rodrigo Ran, MD [address] [place],  [place] [address]  HISTORICAL INFORMATION:  Selected notes from the MEDICAL RECORD NUMBER Referred by Dr. Charise Killian for BRVO OD LEE: 11.12.24, BCVA OD: CF@1 ', OS: 20/40-2 Ocular Hx- BRVO OD, Dry Eye Syndrome, pseudophakia OU PMH- HTN   CURRENT MEDICATIONS: No current outpatient medications on file. (Ophthalmic Drugs)   No current facility-administered medications for this visit. (Ophthalmic Drugs)   Current Outpatient Medications (Other)  Medication Sig   acetaminophen (TYLENOL) 325 MG tablet Take 650 mg by mouth every 6 (six) hours as needed.   ALPRAZolam (XANAX) 0.5 MG tablet Take 0.5 mg by mouth daily.   benazepril (LOTENSIN) 40 MG tablet Take 40 mg by mouth daily.   calcium carbonate (TUMS - DOSED IN MG ELEMENTAL CALCIUM) 500 MG chewable tablet Chew 1 tablet by mouth daily.   Calcium-Vitamin D-Vitamin K (VIACTIV CALCIUM PLUS D PO) Take 2 tablets by mouth 2 (two) times daily.   carbidopa-levodopa (SINEMET IR) 25-100 MG tablet Take 1 tablet by mouth 3 (three) times daily.   desvenlafaxine (PRISTIQ) 100 MG 24 hr tablet Take 100 mg by mouth daily.    fosfomycin (MONUROL) 3 g PACK Take 3 g by mouth once.   lamoTRIgine (LAMICTAL) 150 MG tablet Take 300 mg by mouth daily.   loratadine (CLARITIN) 10 MG tablet Take 10 mg by mouth  daily.   Multiple Vitamins-Minerals (ONE-A-DAY WOMENS 50+ ADVANTAGE) TABS Take 1 tablet by mouth daily.   omeprazole (PRILOSEC) 40 MG capsule TAKE ONE CAPSULE (40MG  TOTAL) BY MOUTH DAILY   ondansetron (ZOFRAN) 8 MG tablet Take 1 tablet (8 mg total) by mouth every 8 (eight) hours as needed for nausea.   Pimavanserin Tartrate (NUPLAZID) 34 MG CAPS Take 1 capsule (34 mg total) by mouth daily.   polyethylene glycol (MIRALAX) 17 g packet MiraLax   propranolol (INDERAL) 10 MG tablet Take 10 mg by mouth 2 (two) times daily.   rivastigmine (EXELON) 9.5 mg/24hr Place 9.5 mg onto the skin daily.   rosuvastatin (CRESTOR) 20 MG tablet Take 1 tablet (20 mg total) by mouth daily.   spironolactone (ALDACTONE) 25 MG tablet TAKE ONE TABLET (25MG  TOTAL) BY MOUTH DAILY   triamcinolone cream (KENALOG) 0.1 % apply to the itching areas TID prn   No current facility-administered medications for this visit. (Other)   REVIEW OF SYSTEMS: ROS   Positive for: Neurological, Musculoskeletal, Eyes Negative for: Cardiovascular Last edited by Charlette Caffey, COT on 11/04/2023  8:25 AM.       ALLERGIES Allergies  Allergen Reactions   Amoxicillin Hives   Cefdinir Other (See Comments)   Other Other (See Comments)   Penicillin G Other (See Comments)   Abilify [Aripiprazole] Other (See Comments)  Makes pt tremble   PAST MEDICAL HISTORY Past Medical History:  Diagnosis Date   Anxiety    Candida esophagitis (HCC)    Chronic kidney disease    DDD (degenerative disc disease)    Depression    Erosive gastritis    Falls    GERD (gastroesophageal reflux disease)    Hiatal hernia    High cholesterol    History of anal fissures    Hypertension    Obesity    Osteoarthritis    Panic disorder    PONV (postoperative nausea and vomiting)    Sleep apnea    Does not need CPAP anymore   Past Surgical History:  Procedure Laterality Date   ABDOMINAL HYSTERECTOMY  1987   LSO / menorrhagia & leiomyomata    APPENDECTOMY     BREAST EXCISIONAL BIOPSY Left 1989   benign   BREAST EXCISIONAL BIOPSY Left 07/11/2020   benign   BREAST SURGERY  1989   benign tumor left breast   CHOLECYSTECTOMY  2001   TUBAL LIGATION     FAMILY HISTORY Family History  Problem Relation Age of Onset   Heart disease Mother    Lung cancer Father    Diabetes Sister    Hypertension Sister    Ovarian cancer Sister    Diabetes Brother    Heart disease Brother    Heart attack Brother    Heart disease Brother    Heart attack Brother    Heart disease Brother    Heart attack Brother    Colon cancer Neg Hx    BRCA 1/2 Neg Hx    Breast cancer Neg Hx    SOCIAL HISTORY Social History   Tobacco Use   Smoking status: Never    Passive exposure: Never   Smokeless tobacco: Never  Vaping Use   Vaping status: Never Used  Substance Use Topics   Alcohol use: No   Drug use: No       OPHTHALMIC EXAM:  Base Eye Exam     Visual Acuity (Snellen - Linear)       Right Left   Dist cc 20/150 20/40   Dist ph cc NI NI    Correction: Glasses         Tonometry (Tonopen, 8:30 AM)       Right Left   Pressure 16 13         Pupils       Dark Light Shape React APD   Right 4 3 Round Brisk None   Left 4 3 Round Brisk Trace         Visual Fields       Left Right    Full Full         Extraocular Movement       Right Left    Full, Ortho Full, Ortho         Neuro/Psych     Oriented x3: Yes   Mood/Affect: Normal         Dilation     Both eyes: 1.0% Mydriacyl, 2.5% Phenylephrine @ 8:26 AM           Slit Lamp and Fundus Exam     Slit Lamp Exam       Right Left   Lids/Lashes Dermatochalasis - upper lid, mild MGD Dermatochalasis - upper lid, mild MGD   Conjunctiva/Sclera White and quiet White and quiet   Cornea well healed cataract wound, tear film debris well healed cataract wound, tear  film debris   Anterior Chamber deep and clear deep and clear   Iris Round and dilated Round and  dilated   Lens PC IOL in good position PC IOL in good position   Anterior Vitreous  Posterior vitreous detachment         Fundus Exam       Right Left   Disc Pink and Sharp, mild tilt, temporal PPA mild Pallor, mild tilt, Compact, PPA   C/D Ratio 0.2 0.2   Macula Blunted foveal reflex, mild interval improvement in central edema, confluent IRH / flame hemes superior macula -- improving Flat, Blunted foveal reflex, RPE mottling, No heme or edema   Vessels attenuated, Tortuous, ST BRVO attenuated, Tortuous   Periphery Attached, +DBH superior quadrant, peripheral drusen, mild reticular degeneration Attached, mild reticular degeneration, No heme           Refraction     Wearing Rx       Sphere Cylinder Axis Add   Right -0.50 +0.25 085 +2.50   Left -1.50 +0.75 055 +2.50           IMAGING AND PROCEDURES  Imaging and Procedures for 11/04/2023  OCT, Retina - OU - Both Eyes       Right Eye Quality was good. Central Foveal Thickness: 436. Progression has improved. Findings include no SRF, abnormal foveal contour, intraretinal hyper-reflective material, epiretinal membrane, intraretinal fluid (Persistent central edema with superior extension -- slightly improved, partial PVD).   Left Eye Quality was good. Central Foveal Thickness: 314. Progression has been stable. Findings include normal foveal contour, no IRF, no SRF.   Notes *Images captured and stored on drive  Diagnosis / Impression:  OD: Persistent central edema with superior extension -- slightly improved, partial PVD OS: NFP, no IRF/SRF  Clinical management:  See below  Abbreviations: NFP - Normal foveal profile. CME - cystoid macular edema. PED - pigment epithelial detachment. IRF - intraretinal fluid. SRF - subretinal fluid. EZ - ellipsoid zone. ERM - epiretinal membrane. ORA - outer retinal atrophy. ORT - outer retinal tubulation. SRHM - subretinal hyper-reflective material. IRHM - intraretinal hyper-reflective  material      Intravitreal Injection, Pharmacologic Agent - OD - Right Eye       Time Out 11/04/2023. 9:47 AM. Confirmed correct patient, procedure, site, and patient consented.   Anesthesia Topical anesthesia was used. Anesthetic medications included Lidocaine 2%, Proparacaine 0.5%.   Procedure Preparation included 5% betadine to ocular surface, eyelid speculum. A supplied (32g) needle was used.   Injection: 1.25 mg Bevacizumab 1.25mg /0.72ml   Route: Intravitreal, Site: Right Eye   NDC: [id number], Lot: [id number], Expiration date: 12/04/2023   Post-op Post injection exam found visual acuity of at least counting fingers. The patient tolerated the procedure well. There were no complications. The patient received written and verbal post procedure care education. Post injection medications were not given.             ASSESSMENT/PLAN:   ICD-10-CM   1. Branch retinal vein occlusion of right eye with macular edema  H34.8310 OCT, Retina - OU - Both Eyes    Intravitreal Injection, Pharmacologic Agent - OD - Right Eye    Bevacizumab (AVASTIN) SOLN 1.25 mg    2. Essential hypertension  I10     3. Hypertensive retinopathy of both eyes  H35.033     4. Pseudophakia, both eyes  Z96.1      BRVO w/ CME OD  - s/p IVA OD #1 (11.21.24), #2 (12.19.24) -  BCVA OD 20/150 from 20/100 - exam shows IRH superior macula w/ extension of DBH to superior periphery -- improving - OCT shows mild interval improvement in central edema - recommend IVA OD #3 today, 01.16.25 - pt wishes to proceed with injection - RBA of procedure discussed, questions answered - IVA informed consent obtained and signed, 11.21.24 (OD) - see procedure note - F/U 4 weeks -- DFE/OCT/possible injection  2,3. Hypertensive retinopathy OU - discussed importance of tight BP control - monitor  4. Pseudophakia OU  - s/p CE/IOL OU (March 2023; Dr. June Leap)  - IOL in good position, doing well  - monitor  Ophthalmic Meds  Ordered this visit:  Meds ordered this encounter  Medications   Bevacizumab (AVASTIN) SOLN 1.25 mg     Return in about 4 weeks (around 12/02/2023) for f/u BRVO OD, DFE, OCT.  There are no Patient Instructions on file for this visit.  This document serves as a record of services personally performed by Karie Chimera, MD, PhD. It was created on their behalf by Annalee Genta, COMT. The creation of this record is the provider's dictation and/or activities during the visit.  Electronically signed by: Annalee Genta, COMT 11/04/23 11:28 AM  This document serves as a record of services personally performed by Karie Chimera, MD, PhD. It was created on their behalf by Glee Arvin. Manson Passey, OA an ophthalmic technician. The creation of this record is the provider's dictation and/or activities during the visit.    Electronically signed by: Glee Arvin. Manson Passey, OA 11/04/23 11:28 AM  Karie Chimera, M.D., Ph.D. Diseases & Surgery of the Retina and Vitreous [facility]  I have reviewed the above documentation for accuracy and completeness, and I agree with the above. Karie Chimera, M.D., Ph.D. 11/04/23 11:28 AM  Abbreviations: M myopia (nearsighted); A astigmatism; H hyperopia (farsighted); P presbyopia; Mrx spectacle prescription;  CTL contact lenses; OD right eye; OS left eye; OU both eyes  XT exotropia; ET esotropia; PEK punctate epithelial keratitis; PEE punctate epithelial erosions; DES dry eye syndrome; MGD meibomian gland dysfunction; ATs artificial tears; PFAT's preservative free artificial tears; NSC nuclear sclerotic cataract; PSC posterior subcapsular cataract; ERM epi-retinal membrane; PVD posterior vitreous detachment; RD retinal detachment; DM diabetes mellitus; DR diabetic retinopathy; NPDR non-proliferative diabetic retinopathy; PDR proliferative diabetic retinopathy; CSME clinically significant macular edema; DME diabetic macular edema; dbh dot blot hemorrhages; CWS cotton  wool spot; POAG primary open angle glaucoma; C/D cup-to-disc ratio; HVF humphrey visual field; GVF goldmann visual field; OCT optical coherence tomography; IOP intraocular pressure; BRVO Branch retinal vein occlusion; CRVO central retinal vein occlusion; CRAO central retinal artery occlusion; BRAO branch retinal artery occlusion; RT retinal tear; SB scleral buckle; PPV pars plana vitrectomy; VH Vitreous hemorrhage; PRP panretinal laser photocoagulation; IVK intravitreal kenalog; VMT vitreomacular traction; MH Macular hole;  NVD neovascularization of the disc; NVE neovascularization elsewhere; AREDS age related eye disease study; ARMD age related macular degeneration; POAG primary open angle glaucoma; EBMD epithelial/anterior basement membrane dystrophy; ACIOL anterior chamber intraocular lens; IOL intraocular lens; PCIOL posterior chamber intraocular lens; Phaco/IOL phacoemulsification with intraocular lens placement; PRK photorefractive keratectomy; LASIK laser assisted in situ keratomileusis; HTN hypertension; DM diabetes mellitus; COPD chronic obstructive pulmonary disease

## 2023-11-04 ENCOUNTER — Encounter (INDEPENDENT_AMBULATORY_CARE_PROVIDER_SITE_OTHER): Payer: Self-pay | Admitting: Ophthalmology

## 2023-11-04 ENCOUNTER — Ambulatory Visit (INDEPENDENT_AMBULATORY_CARE_PROVIDER_SITE_OTHER): Payer: Medicare HMO | Admitting: Ophthalmology

## 2023-11-04 DIAGNOSIS — H34831 Tributary (branch) retinal vein occlusion, right eye, with macular edema: Secondary | ICD-10-CM

## 2023-11-04 DIAGNOSIS — Z961 Presence of intraocular lens: Secondary | ICD-10-CM

## 2023-11-04 DIAGNOSIS — H35033 Hypertensive retinopathy, bilateral: Secondary | ICD-10-CM

## 2023-11-04 DIAGNOSIS — I10 Essential (primary) hypertension: Secondary | ICD-10-CM | POA: Diagnosis not present

## 2023-11-04 MED ORDER — BEVACIZUMAB CHEMO INJECTION 1.25MG/0.05ML SYRINGE FOR KALEIDOSCOPE
1.2500 mg | INTRAVITREAL | Status: AC | PRN
  Administered 2023-11-04: 1.25 mg via INTRAVITREAL

## 2023-11-09 ENCOUNTER — Ambulatory Visit: Payer: HMO | Attending: Internal Medicine | Admitting: Internal Medicine

## 2023-11-09 ENCOUNTER — Encounter: Payer: Self-pay | Admitting: Internal Medicine

## 2023-11-09 VITALS — BP 132/62 | HR 63 | Ht 63.0 in | Wt 158.2 lb

## 2023-11-09 DIAGNOSIS — F419 Anxiety disorder, unspecified: Secondary | ICD-10-CM | POA: Diagnosis not present

## 2023-11-09 DIAGNOSIS — I4510 Unspecified right bundle-branch block: Secondary | ICD-10-CM

## 2023-11-09 DIAGNOSIS — E785 Hyperlipidemia, unspecified: Secondary | ICD-10-CM

## 2023-11-09 DIAGNOSIS — G4733 Obstructive sleep apnea (adult) (pediatric): Secondary | ICD-10-CM

## 2023-11-09 DIAGNOSIS — R002 Palpitations: Secondary | ICD-10-CM

## 2023-11-09 DIAGNOSIS — I10 Essential (primary) hypertension: Secondary | ICD-10-CM | POA: Diagnosis not present

## 2023-11-09 DIAGNOSIS — R079 Chest pain, unspecified: Secondary | ICD-10-CM

## 2023-11-09 NOTE — Patient Instructions (Addendum)
Medication Instructions:  Your physician recommends that you continue on your current medications as directed. Please refer to the Current Medication list given to you today.  *Added amlodipine (NORVASC) to medication list**   *If you need a refill on your cardiac medications before your next appointment, please call your pharmacy*   Lab Work: None    If you have labs (blood work) drawn today and your tests are completely normal, you will receive your results only by: MyChart Message (if you have MyChart) OR A paper copy in the mail If you have any lab test that is abnormal or we need to change your treatment, we will call you to review the results.   Testing/Procedures: None    Follow-Up: At [facility], you and your health needs are our priority.  As part of our continuing mission to provide you with exceptional heart care, we have created designated Provider Care Teams.  These Care Teams include your primary Cardiologist (physician) and Advanced Practice Providers (APPs -  Physician Assistants and Nurse Practitioners) who all work together to provide you with the care you need, when you need it.  We recommend signing up for the patient portal called "MyChart".  Sign up information is provided on this After Visit Summary.  MyChart is used to connect with patients for Virtual Visits (Telemedicine).  Patients are able to view lab/test results, encounter notes, upcoming appointments, etc.  Non-urgent messages can be sent to your provider as well.   To learn more about what you can do with MyChart, go to [email or web address].    Your next appointment:   6 month(s)  The format for your next appointment:   In Person  Provider:   Parke Poisson, MD    Other Instructions

## 2023-11-09 NOTE — Progress Notes (Unsigned)
Cardiology Office Note:  .   Date:  11/09/2023  ID:  Linda [name], DOB 03-14-1941, MRN [id number] PCP: Rodrigo Ran, MD  [facility] Providers Cardiologist:  Parke Poisson, MD    History of Present Illness: .   Linda [name] is a 83 y.o. female.  Discussed the use of AI scribe software for clinical note transcription with the patient, who gave verbal consent to proceed.  History of Present Illness   The patient, with a history of Parkinson's disease, high blood pressure, and palpitations, presents for a follow-up visit. She is under the care of a neurologist for her Parkinson's disease. She reports being on Sinemet (carbidopa levodopa) for Parkinson's, but it is unclear if she has noticed any improvement in her symptoms. [name] notes that the patient's primary concerns are related to dementia rather than Parkinson's.  The patient also reports experiencing palpitations, describing a sensation of her heart speeding up. She is on propranolol three times a day for this issue. She notes that she has not noticed any significant changes in her palpitations since her last visit, but they are not particularly bothersome.  The patient also has a history of high blood pressure and is on amlodipine and benazepril. She reports that her blood pressure has been running high on some days, reaching up to 150. As a result, her amlodipine dosage was increased to 5mg  daily. I had discontinued amlodipine 10 mg at her last visit but PCP resumed appropriately given those elevated BPs.  In addition, the patient has a history of fluid accumulation and is on spironolactone. She reports that she feels the fluid status is under good control at this time.        ROS: negative except per HPI above.  Studies Reviewed: .        Results   DIAGNOSTIC EKG: right bundle branch block PET stress test: normal (2023)     Risk Assessment/Calculations:             Physical Exam:    VS:  BP 132/62 (BP Location: Left Arm, Patient Position: Sitting, Cuff Size: Normal)   Pulse 63   Ht 5\' 3"  (1.6 m)   Wt 158 lb 3.2 oz (71.8 kg)   SpO2 93%   BMI 28.02 kg/m    Wt Readings from Last 3 Encounters:  11/09/23 158 lb 3.2 oz (71.8 kg)  08/06/23 166 lb (75.3 kg)  07/27/23 161 lb (73 kg)     Physical Exam   CHEST: Lungs clear to auscultation. CARDIOVASCULAR: Heart sounds normal on auscultation.     GEN: Well nourished, well developed in no acute distress NECK: No JVD; No carotid bruits CARDIAC: iRRR, no murmurs, rubs, gallops RESPIRATORY:  Clear to auscultation without rales, wheezing or rhonchi  ABDOMEN: Soft, non-tender, non-distended EXTREMITIES:  No edema; No deformity   ASSESSMENT AND PLAN: .    Assessment & Plan Hypertension, unspecified type  Anxiety  Palpitations  Hyperlipidemia, unspecified hyperlipidemia type  Right bundle branch block  Obstructive sleep apnea syndrome  Chest pain, unspecified type   Assessment and Plan    Parkinson's Disease Under the care of neurologist. Currently on Carbidopa-Levodopa with uncertain benefit. No new symptoms reported. Nuplazid (Pimavanserin) was discussed by neurology but not started. -Continue current management under neurologist. Next appointment scheduled for February 3rd.  Palpitations Reports ongoing palpitations. Currently on Propranolol 10mg  TID. No worsening of symptoms reported since last visit. -Continue Propranolol 10mg  TID. Monitor for changes in symptoms.  Hypertension Blood pressure well controlled on Amlodipine 5mg  daily and Benazepril 40mg  daily. -Continue current regimen. Monitor blood pressure.  Hyperlipidemia On Rosuvastatin 20mg  daily. -Continue current regimen.  Fluid Retention Reports good control with current diuretic regimen (Spironolactone 25mg  daily). -Continue current regimen. Monitor for changes in fluid status.  Follow-up No new cardiac symptoms reported. Last stress  test in 2023 was normal. Last echocardiogram in 2021 showed diastolic dysfunction but no significant concerns. -Return in 6 months for follow-up and EKG. Repeat echocardiogram if new symptoms develop.

## 2023-11-22 ENCOUNTER — Ambulatory Visit: Payer: PPO | Admitting: Diagnostic Neuroimaging

## 2023-11-22 ENCOUNTER — Encounter: Payer: Self-pay | Admitting: Diagnostic Neuroimaging

## 2023-11-22 VITALS — BP 112/54 | HR 55 | Ht 63.0 in | Wt 159.0 lb

## 2023-11-22 DIAGNOSIS — F02A2 Dementia in other diseases classified elsewhere, mild, with psychotic disturbance: Secondary | ICD-10-CM | POA: Diagnosis not present

## 2023-11-22 DIAGNOSIS — G3183 Dementia with Lewy bodies: Secondary | ICD-10-CM

## 2023-11-22 DIAGNOSIS — G20A1 Parkinson's disease without dyskinesia, without mention of fluctuations: Secondary | ICD-10-CM | POA: Diagnosis not present

## 2023-11-22 DIAGNOSIS — F02A18 Dementia in other diseases classified elsewhere, mild, with other behavioral disturbance: Secondary | ICD-10-CM | POA: Diagnosis not present

## 2023-11-22 MED ORDER — CARBIDOPA-LEVODOPA 25-100 MG PO TABS: 2.0000 | ORAL_TABLET | Freq: Three times a day (TID) | ORAL | 6 refills | Status: DC

## 2023-11-22 NOTE — Progress Notes (Signed)
[facility]  PATIENT: Linda [name] DOB: 06-25-1941  REFERRING CLINICIAN: Rodrigo Ran, MD HISTORY FROM: patient and husband REASON FOR VISIT: follow up   HISTORICAL  CHIEF COMPLAINT:  Chief Complaint  Patient presents with   Follow-up    Pt in room 7.James in room. Here for 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. Pt fell last week at home, no head injury. Pt noticed her memory has declined. Pt never started Pimavanserin Tartrate due reading side effects could cause heart issues.     HISTORY OF PRESENT ILLNESS:   UPDATE (11/22/23, VRP): Since last visit, doing slightly worse with memory. Tried carb/levo 1 tab three times a day, but not much benefit. Mild visual hallucinations continue (non-threatening). Some anxiety in the evening.   PRIOR HPI (07/27/23): 83 year old female here for evaluation of memory loss, delusions, gait difficulty.  Patient is have at least 10 years of frequent falls.  She was still walking fairly well about 5 years ago although husband had noted shortening steps.  In the last 1 to 2 years has had more problems with memory loss, depression, stiff muscles, delusions, disorientation.  Today when husband brought patient to this visit they stopped at [facility] while waiting for their appointment time.  Patient looked up and asked her husband to "when did you come here, did you follow-us here?".    REVIEW OF SYSTEMS: Full 14 system review of systems performed and negative with exception of: as per HPI.  ALLERGIES: Allergies  Allergen Reactions   Amoxicillin Hives   Cefdinir Other (See Comments)   Other Other (See Comments)   Penicillin G Other (See Comments)   Abilify [Aripiprazole] Other (See Comments)    Makes pt tremble    HOME MEDICATIONS: Outpatient Medications Prior to Visit  Medication Sig Dispense Refill   acetaminophen (TYLENOL) 325 MG tablet Take 650 mg by mouth every 6 (six) hours as needed.     ALPRAZolam (XANAX) 0.5  MG tablet Take 0.5 mg by mouth daily.     amLODipine (NORVASC) 5 MG tablet Take 1 tablet (5 mg total) by mouth daily.     benazepril (LOTENSIN) 40 MG tablet Take 40 mg by mouth daily.     calcium carbonate (TUMS - DOSED IN MG ELEMENTAL CALCIUM) 500 MG chewable tablet Chew 1 tablet by mouth daily.     Calcium-Vitamin D-Vitamin K (VIACTIV CALCIUM PLUS D PO) Take 2 tablets by mouth 2 (two) times daily.     desvenlafaxine (PRISTIQ) 100 MG 24 hr tablet Take 100 mg by mouth daily.      fosfomycin (MONUROL) 3 g PACK Take 3 g by mouth once.     lamoTRIgine (LAMICTAL) 150 MG tablet Take 300 mg by mouth daily.     loratadine (CLARITIN) 10 MG tablet Take 10 mg by mouth daily.     Multiple Vitamins-Minerals (ONE-A-DAY WOMENS 50+ ADVANTAGE) TABS Take 1 tablet by mouth daily.     omeprazole (PRILOSEC) 40 MG capsule TAKE ONE CAPSULE (40MG  TOTAL) BY MOUTH DAILY 90 capsule 3   ondansetron (ZOFRAN) 8 MG tablet Take 1 tablet (8 mg total) by mouth every 8 (eight) hours as needed for nausea. 20 tablet 6   polyethylene glycol (MIRALAX) 17 g packet MiraLax     propranolol (INDERAL) 10 MG tablet Take 10 mg by mouth 2 (two) times daily.     rosuvastatin (CRESTOR) 20 MG tablet Take 1 tablet (20 mg total) by mouth daily. 90 tablet 3   spironolactone (ALDACTONE) 25  MG tablet TAKE ONE TABLET (25MG  TOTAL) BY MOUTH DAILY 90 tablet 3   triamcinolone cream (KENALOG) 0.1 % apply to the itching areas TID prn     carbidopa-levodopa (SINEMET IR) 25-100 MG tablet Take 1 tablet by mouth 3 (three) times daily. 90 tablet 6   rivastigmine (EXELON) 9.5 mg/24hr Place 9.5 mg onto the skin daily. (Patient not taking: Reported on 11/22/2023)     Pimavanserin Tartrate (NUPLAZID) 34 MG CAPS Take 1 capsule (34 mg total) by mouth daily. (Patient not taking: Reported on 11/22/2023) 30 capsule 6   No facility-administered medications prior to visit.    PAST MEDICAL HISTORY: Past Medical History:  Diagnosis Date   Anxiety    Candida esophagitis  (HCC)    Chronic kidney disease    DDD (degenerative disc disease)    Depression    Erosive gastritis    Falls    GERD (gastroesophageal reflux disease)    Hiatal hernia    High cholesterol    History of anal fissures    Hypertension    Obesity    Osteoarthritis    Panic disorder    PONV (postoperative nausea and vomiting)    Sleep apnea    Does not need CPAP anymore    PAST SURGICAL HISTORY: Past Surgical History:  Procedure Laterality Date   ABDOMINAL HYSTERECTOMY  1987   LSO / menorrhagia & leiomyomata   APPENDECTOMY     BREAST EXCISIONAL BIOPSY Left 1989   benign   BREAST EXCISIONAL BIOPSY Left 07/11/2020   benign   BREAST SURGERY  1989   benign tumor left breast   CHOLECYSTECTOMY  2001   TUBAL LIGATION      FAMILY HISTORY: Family History  Problem Relation Age of Onset   Heart disease Mother    Lung cancer Father    Diabetes Sister    Hypertension Sister    Ovarian cancer Sister    Diabetes Brother    Heart disease Brother    Heart attack Brother    Heart disease Brother    Heart attack Brother    Heart disease Brother    Heart attack Brother    Colon cancer Neg Hx    BRCA 1/2 Neg Hx    Breast cancer Neg Hx     SOCIAL HISTORY: Social History   Socioeconomic History   Marital status: Married    Spouse name: Not on file   Number of children: 1   Years of education: Not on file   Highest education level: Not on file  Occupational History   Occupation: Retired   Tobacco Use   Smoking status: Never    Passive exposure: Never   Smokeless tobacco: Never  Vaping Use   Vaping status: Never Used  Substance and Sexual Activity   Alcohol use: No   Drug use: No   Sexual activity: Yes    Birth control/protection: None  Other Topics Concern   Not on file  Social History Narrative   Daily caffeine    Social Drivers of Corporate investment banker Strain: Not on file  Food Insecurity: Not on file  Transportation Needs: Not on file  Physical  Activity: Not on file  Stress: Not on file  Social Connections: Not on file  Intimate Partner Violence: Not on file     PHYSICAL EXAM  GENERAL EXAM/CONSTITUTIONAL: Vitals:  Vitals:   11/22/23 1301  BP: (!) 112/54  Pulse: (!) 55  Weight: 159 lb (72.1 kg)  Height:  5\' 3"  (1.6 m)   Body mass index is 28.17 kg/m. Wt Readings from Last 3 Encounters:  11/22/23 159 lb (72.1 kg)  11/09/23 158 lb 3.2 oz (71.8 kg)  08/06/23 166 lb (75.3 kg)   Patient is in no distress; well developed, nourished and groomed; neck is supple  CARDIOVASCULAR: Examination of carotid arteries is normal; no carotid bruits Regular rate and rhythm, no murmurs Examination of peripheral vascular system by observation and palpation is normal  EYES: Ophthalmoscopic exam of optic discs and posterior segments is normal; no papilledema or hemorrhages No results found.  MUSCULOSKELETAL: Gait, strength, tone, movements noted in Neurologic exam below  NEUROLOGIC: MENTAL STATUS:     11/22/2023    1:09 PM 07/27/2023    9:33 AM  MMSE - Mini Mental State Exam  Orientation to time 1 4  Orientation to Place 5 4  Registration 3 3  Attention/ Calculation 1 4  Recall 2 2  Language- name 2 objects 2 2  Language- repeat 1 1  Language- follow 3 step command 3 3  Language- read & follow direction 1 1  Write a sentence 1 1  Copy design 0 0  Total score 20 25   awake, alert, oriented to person [facility] memory  DECR attention and concentration language fluent, comprehension intact, naming intact fund of knowledge appropriate  CRANIAL NERVE:  2nd - no papilledema on fundoscopic exam 2nd, 3rd, 4th, 6th - pupils equal and reactive to light, visual fields full to confrontation, extraocular muscles intact, no nystagmus 5th - facial sensation symmetric 7th - facial strength symmetric 8th - hearing intact 9th - palate elevates symmetrically, uvula midline 11th - shoulder shrug symmetric 12th - tongue protrusion  midline MASKED FACIES; DECR EYE BLINKING; SOFT VOICE  MOTOR:  COGWHEELING RIGIDITY IN BUE; BRADYKINESIA IN BUE AND BLE DIFFUSE 4/5 STRENGTH  SENSORY:  normal and symmetric to light touch, temperature, vibration  COORDINATION:  finger-nose-finger, fine finger movements SLOW  REFLEXES:  deep tendon reflexes 1+ and symmetric  GAIT/STATION:  DECR ARM SWING; SHORT STEPS; SLOW GAIT; EN BLOC TURNING; USING WALKER     DIAGNOSTIC DATA (LABS, IMAGING, TESTING) - I reviewed patient records, labs, notes, testing and imaging myself where available.  Lab Results  Component Value Date   WBC 8.9 05/22/2018   HGB 15.8 (H) 05/22/2018   HCT 46.5 (H) 05/22/2018   MCV 94.3 05/22/2018   PLT 317 05/22/2018      Component Value Date/Time   NA 141 10/30/2019 1221   K 4.7 10/30/2019 1221   CL 102 10/30/2019 1221   CO2 25 10/30/2019 1221   GLUCOSE 84 10/30/2019 1221   GLUCOSE 93 05/22/2018 1110   BUN 16 10/30/2019 1221   CREATININE 1.03 (H) 10/30/2019 1221   CALCIUM 10.0 10/30/2019 1221   PROT 6.8 04/28/2022 0814   ALBUMIN 4.7 04/28/2022 0814   AST 22 04/28/2022 0814   ALT 15 04/28/2022 0814   ALKPHOS 81 04/28/2022 0814   BILITOT 0.4 04/28/2022 0814   GFRNONAA 53 (L) 10/30/2019 1221   GFRAA 61 10/30/2019 1221   Lab Results  Component Value Date   CHOL 196 04/28/2022   HDL 63 04/28/2022   LDLCALC 98 04/28/2022   TRIG 207 (H) 04/28/2022   CHOLHDL 3.1 04/28/2022   No results found for: "HGBA1C" No results found for: "VITAMINB12" No results found for: "TSH"   09/05/23 MRI brain with and without contrast imaging: -Moderate frontal and perisylvian atrophy. -Mild periventricular and subcortical chronic small  vessel ischemic disease. -No acute findings.   ASSESSMENT AND PLAN  83 y.o. year old female here with:   Dx:  1. 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 (HCC)      PLAN:  GAIT DIFF, RIGIDITY, BRADYKINESIA, COGNITIVE DECLINE, DELUSIONS (frequent falls since 2014, worsening memory and delusion problems in the last few years; ddx: parkinson's disease with dementia vs dementia with lewy bodies)  - increase carbidopa / levodopa to 2 tabs three times a day   - monitor for delusions / hallucinations; if significantly worsening, then consider nuplazid (not tried yet)  - use walker; home PT exercises; fall precautions reviewed  Meds ordered this encounter  Medications   carbidopa-levodopa (SINEMET IR) 25-100 MG tablet    Sig: Take 2 tablets by mouth 3 (three) times daily.    Dispense:  180 tablet    Refill:  6   Return in about 6 months (around 05/21/2024) for MyChart visit (15 min).    Suanne Marker, MD 11/22/2023, 1:47 PM Certified in Neurology, Neurophysiology and Neuroimaging  [facility] [address] [place], [place] [address] [phone]

## 2023-11-24 ENCOUNTER — Telehealth: Payer: Self-pay | Admitting: Diagnostic Neuroimaging

## 2023-11-24 NOTE — Telephone Encounter (Signed)
[facility]  says the script they have for pt's carbidopa-levodopa (SINEMET IR) 25-100 MG tablet is 1 tablet by mouth 3 times daily, they need a script sent to them stating :Sig - Route: Take 2 tablets by mouth 3 (three) times daily. - Oral

## 2023-11-24 NOTE — Telephone Encounter (Signed)
Called and spoke with pharmacist Tawanna Cooler and he confirmed they do have the Rx for carbidopa-levodopa (SINEMET IR) 25-100 MG tablet is 2 tablets by mouth 3 times daily that was sent on 11/22/23  No new Rx needed

## 2023-11-30 NOTE — Progress Notes (Signed)
[facility] - Clinic Note  12/02/2023   CHIEF COMPLAINT Patient presents for Retina Follow Up  HISTORY OF PRESENT ILLNESS: Linda [name] is a 83 y.o. female who presents to the clinic today for:  HPI     Retina Follow Up   Patient presents with  CRVO/BRVO.  In right eye.  This started 4 weeks ago.  Duration of 4 weeks.  I, the attending physician,  performed the HPI with the patient and updated documentation appropriately.        Comments   Patient feels the vision seems to be doing good. She is using AT's.       Last edited by Rennis Chris, MD on 12/02/2023  9:08 AM.    Pt states "it seems like I can see good"  Referring physician: Rodrigo Ran, MD [address] [place],  [place] [address]  HISTORICAL INFORMATION:  Selected notes from the MEDICAL RECORD NUMBER Referred by Dr. Charise Killian for BRVO OD LEE: 11.12.24, BCVA OD: CF@1 ', OS: 20/40-2 Ocular Hx- BRVO OD, Dry Eye Syndrome, pseudophakia OU PMH- HTN   CURRENT MEDICATIONS: No current outpatient medications on file. (Ophthalmic Drugs)   No current facility-administered medications for this visit. (Ophthalmic Drugs)   Current Outpatient Medications (Other)  Medication Sig   acetaminophen (TYLENOL) 325 MG tablet Take 650 mg by mouth every 6 (six) hours as needed.   ALPRAZolam (XANAX) 0.5 MG tablet Take 0.5 mg by mouth daily.   amLODipine (NORVASC) 5 MG tablet Take 1 tablet (5 mg total) by mouth daily.   benazepril (LOTENSIN) 40 MG tablet Take 40 mg by mouth daily.   calcium carbonate (TUMS - DOSED IN MG ELEMENTAL CALCIUM) 500 MG chewable tablet Chew 1 tablet by mouth daily.   Calcium-Vitamin D-Vitamin K (VIACTIV CALCIUM PLUS D PO) Take 2 tablets by mouth 2 (two) times daily.   carbidopa-levodopa (SINEMET IR) 25-100 MG tablet Take 2 tablets by mouth 3 (three) times daily.   desvenlafaxine (PRISTIQ) 100 MG 24 hr tablet Take 100 mg by mouth daily.    fosfomycin (MONUROL) 3 g PACK Take 3 g by mouth  once.   lamoTRIgine (LAMICTAL) 150 MG tablet Take 300 mg by mouth daily.   loratadine (CLARITIN) 10 MG tablet Take 10 mg by mouth daily.   Multiple Vitamins-Minerals (ONE-A-DAY WOMENS 50+ ADVANTAGE) TABS Take 1 tablet by mouth daily.   omeprazole (PRILOSEC) 40 MG capsule TAKE ONE CAPSULE (40MG  TOTAL) BY MOUTH DAILY   ondansetron (ZOFRAN) 8 MG tablet Take 1 tablet (8 mg total) by mouth every 8 (eight) hours as needed for nausea.   polyethylene glycol (MIRALAX) 17 g packet MiraLax   propranolol (INDERAL) 10 MG tablet Take 10 mg by mouth 2 (two) times daily.   rivastigmine (EXELON) 9.5 mg/24hr Place 9.5 mg onto the skin daily.   rosuvastatin (CRESTOR) 20 MG tablet Take 1 tablet (20 mg total) by mouth daily.   spironolactone (ALDACTONE) 25 MG tablet TAKE ONE TABLET (25MG  TOTAL) BY MOUTH DAILY   triamcinolone cream (KENALOG) 0.1 % apply to the itching areas TID prn   No current facility-administered medications for this visit. (Other)   REVIEW OF SYSTEMS: ROS   Positive for: Neurological, Musculoskeletal, Eyes Negative for: Cardiovascular Last edited by Charlette Caffey, COT on 12/02/2023  7:52 AM.     ALLERGIES Allergies  Allergen Reactions   Amoxicillin Hives   Cefdinir Other (See Comments)   Other Other (See Comments)   Penicillin G Other (See Comments)  Abilify [Aripiprazole] Other (See Comments)    Makes pt tremble   PAST MEDICAL HISTORY Past Medical History:  Diagnosis Date   Anxiety    Candida esophagitis (HCC)    Chronic kidney disease    DDD (degenerative disc disease)    Depression    Erosive gastritis    Falls    GERD (gastroesophageal reflux disease)    Hiatal hernia    High cholesterol    History of anal fissures    Hypertension    Obesity    Osteoarthritis    Panic disorder    PONV (postoperative nausea and vomiting)    Sleep apnea    Does not need CPAP anymore   Past Surgical History:  Procedure Laterality Date   ABDOMINAL HYSTERECTOMY  1987   LSO  / menorrhagia & leiomyomata   APPENDECTOMY     BREAST EXCISIONAL BIOPSY Left 1989   benign   BREAST EXCISIONAL BIOPSY Left 07/11/2020   benign   BREAST SURGERY  1989   benign tumor left breast   CHOLECYSTECTOMY  2001   TUBAL LIGATION     FAMILY HISTORY Family History  Problem Relation Age of Onset   Heart disease Mother    Lung cancer Father    Diabetes Sister    Hypertension Sister    Ovarian cancer Sister    Diabetes Brother    Heart disease Brother    Heart attack Brother    Heart disease Brother    Heart attack Brother    Heart disease Brother    Heart attack Brother    Colon cancer Neg Hx    BRCA 1/2 Neg Hx    Breast cancer Neg Hx    SOCIAL HISTORY Social History   Tobacco Use   Smoking status: Never    Passive exposure: Never   Smokeless tobacco: Never  Vaping Use   Vaping status: Never Used  Substance Use Topics   Alcohol use: No   Drug use: No       OPHTHALMIC EXAM:  Base Eye Exam     Visual Acuity (Snellen - Linear)       Right Left   Dist [place] 20/150 20/30   Dist ph  NI NI         Tonometry (Tonopen, 7:55 AM)       Right Left   Pressure 16 14         Pupils       Dark Light Shape React APD   Right 4 3 Round Brisk None   Left 4 3 Round Brisk None         Visual Fields       Left Right    Full Full         Extraocular Movement       Right Left    Full, Ortho Full, Ortho         Neuro/Psych     Oriented x3: Yes   Mood/Affect: Normal         Dilation     Both eyes: 1.0% Mydriacyl, 2.5% Phenylephrine @ 7:53 AM           Slit Lamp and Fundus Exam     Slit Lamp Exam       Right Left   Lids/Lashes Dermatochalasis - upper lid, mild MGD Dermatochalasis - upper lid, mild MGD   Conjunctiva/Sclera White and quiet White and quiet   Cornea well healed cataract wound, tear film debris well healed  cataract wound, tear film debris   Anterior Chamber deep and clear deep and clear   Iris Round and dilated Round  and dilated   Lens PC IOL in good position PC IOL in good position   Anterior Vitreous  Posterior vitreous detachment         Fundus Exam       Right Left   Disc Pink and Sharp, mild tilt, temporal PPA mild Pallor, mild tilt, Compact, PPA   C/D Ratio 0.2 0.2   Macula Blunted foveal reflex, persistent central edema, confluent IRH / flame hemes superior macula -- mildly improved Flat, Blunted foveal reflex, RPE mottling, No heme or edema   Vessels attenuated, Tortuous, ST BRVO attenuated, Tortuous   Periphery Attached, +DBH superior quadrant, peripheral drusen, mild reticular degeneration Attached, mild reticular degeneration, No heme           Refraction     Wearing Rx       Sphere Cylinder Axis Add   Right -0.50 +0.25 085 +2.50   Left -1.50 +0.75 055 +2.50           IMAGING AND PROCEDURES  Imaging and Procedures for 12/02/2023  OCT, Retina - OU - Both Eyes       Right Eye Quality was good. Central Foveal Thickness: 427. Progression has been stable. Findings include no SRF, abnormal foveal contour, intraretinal hyper-reflective material, epiretinal membrane, intraretinal fluid (Persistent central edema with superior extension, partial PVD).   Left Eye Quality was good. Central Foveal Thickness: 315. Progression has been stable. Findings include normal foveal contour, no IRF, no SRF.   Notes *Images captured and stored on drive  Diagnosis / Impression:  OD: Persistent central edema with superior extension, partial PVD OS: NFP, no IRF/SRF  Clinical management:  See below  Abbreviations: NFP - Normal foveal profile. CME - cystoid macular edema. PED - pigment epithelial detachment. IRF - intraretinal fluid. SRF - subretinal fluid. EZ - ellipsoid zone. ERM - epiretinal membrane. ORA - outer retinal atrophy. ORT - outer retinal tubulation. SRHM - subretinal hyper-reflective material. IRHM - intraretinal hyper-reflective material      Intravitreal Injection,  Pharmacologic Agent - OD - Right Eye       Time Out 12/02/2023. 8:21 AM. Confirmed correct patient, procedure, site, and patient consented.   Anesthesia Topical anesthesia was used. Anesthetic medications included Lidocaine 2%, Proparacaine 0.5%.   Procedure Preparation included 5% betadine to ocular surface, eyelid speculum. A (32g) needle was used.   Injection: 2 mg aflibercept 2 MG/0.05ML   Route: Intravitreal, Site: Right Eye   NDC: [id number], Lot: [id number], Expiration date: 03/17/2025, Waste: 0 mL   Post-op Post injection exam found visual acuity of at least counting fingers. The patient tolerated the procedure well. There were no complications. The patient received written and verbal post procedure care education. Post injection medications were not given.           ASSESSMENT/PLAN:   ICD-10-CM   1. Branch retinal vein occlusion of right eye with macular edema  H34.8310 OCT, Retina - OU - Both Eyes    Intravitreal Injection, Pharmacologic Agent - OD - Right Eye    aflibercept (EYLEA) SOLN 2 mg    2. Essential hypertension  I10     3. Hypertensive retinopathy of both eyes  H35.033     4. Pseudophakia, both eyes  Z96.1      1. BRVO w/ CME OD  - s/p IVA OD #1 (11.21.24), #2 (12.19.24), #3 (01.16.25) -  BCVA OD 20/150 -- stable - exam shows IRH superior macula w/ extension of DBH to superior periphery -- improving - OCT shows persistent central edema -- minimal improvement **discussed decreased efficacy / resistance to Avastin and potential benefit of switching medication** - recommend switching to IVE OD #1 today, 02.13.25 - pt wishes to proceed with injection and cover 20% coinsurance for medication - RBA of procedure discussed, questions answered - IVE informed consent obtained and signed, 02.13.25 (OD) - see procedure note - F/U 4 weeks -- DFE/OCT/possible injection  2,3. Hypertensive retinopathy OU - discussed importance of tight BP control - monitor  4.  Pseudophakia OU  - s/p CE/IOL OU (March 2023; Dr. June Leap)  - IOL in good position, doing well  - monitor  Ophthalmic Meds Ordered this visit:  Meds ordered this encounter  Medications   aflibercept (EYLEA) SOLN 2 mg    Return in about 4 weeks (around 12/30/2023) for f/u BRVO OD, DFE, OCT, Possible Injxn.  There are no Patient Instructions on file for this visit.  This document serves as a record of services personally performed by Karie Chimera, MD, PhD. It was created on their behalf by Annalee Genta, COMT. The creation of this record is the provider's dictation and/or activities during the visit.  Electronically signed by: Annalee Genta, COMT 12/02/23 9:14 AM  This document serves as a record of services personally performed by Karie Chimera, MD, PhD. It was created on their behalf by Glee Arvin. Manson Passey, OA an ophthalmic technician. The creation of this record is the provider's dictation and/or activities during the visit.    Electronically signed by: Glee Arvin. Manson Passey, OA 12/02/23 9:14 AM  Karie Chimera, M.D., Ph.D. Diseases & Surgery of the Retina and Vitreous [facility] [place]  I have reviewed the above documentation for accuracy and completeness, and I agree with the above. Karie Chimera, M.D., Ph.D. 12/02/23 9:14 AM   Abbreviations: M myopia (nearsighted); A astigmatism; H hyperopia (farsighted); P presbyopia; Mrx spectacle prescription;  CTL contact lenses; OD right eye; OS left eye; OU both eyes  XT exotropia; ET esotropia; PEK punctate epithelial keratitis; PEE punctate epithelial erosions; DES dry eye syndrome; MGD meibomian gland dysfunction; ATs artificial tears; PFAT's preservative free artificial tears; NSC nuclear sclerotic cataract; PSC posterior subcapsular cataract; ERM epi-retinal membrane; PVD posterior vitreous detachment; RD retinal detachment; DM diabetes mellitus; DR diabetic retinopathy; NPDR non-proliferative diabetic retinopathy; PDR  proliferative diabetic retinopathy; CSME clinically significant macular edema; DME diabetic macular edema; dbh dot blot hemorrhages; CWS cotton wool spot; POAG primary open angle glaucoma; C/D cup-to-disc ratio; HVF humphrey visual field; GVF goldmann visual field; OCT optical coherence tomography; IOP intraocular pressure; BRVO Branch retinal vein occlusion; CRVO central retinal vein occlusion; CRAO central retinal artery occlusion; BRAO branch retinal artery occlusion; RT retinal tear; SB scleral buckle; PPV pars plana vitrectomy; VH Vitreous hemorrhage; PRP panretinal laser photocoagulation; IVK intravitreal kenalog; VMT vitreomacular traction; MH Macular hole;  NVD neovascularization of the disc; NVE neovascularization elsewhere; AREDS age related eye disease study; ARMD age related macular degeneration; POAG primary open angle glaucoma; EBMD epithelial/anterior basement membrane dystrophy; ACIOL anterior chamber intraocular lens; IOL intraocular lens; PCIOL posterior chamber intraocular lens; Phaco/IOL phacoemulsification with intraocular lens placement; PRK photorefractive keratectomy; LASIK laser assisted in situ keratomileusis; HTN hypertension; DM diabetes mellitus; COPD chronic obstructive pulmonary disease

## 2023-12-02 ENCOUNTER — Ambulatory Visit (INDEPENDENT_AMBULATORY_CARE_PROVIDER_SITE_OTHER): Payer: HMO | Admitting: Ophthalmology

## 2023-12-02 ENCOUNTER — Encounter (INDEPENDENT_AMBULATORY_CARE_PROVIDER_SITE_OTHER): Payer: Self-pay | Admitting: Ophthalmology

## 2023-12-02 DIAGNOSIS — Z961 Presence of intraocular lens: Secondary | ICD-10-CM

## 2023-12-02 DIAGNOSIS — I10 Essential (primary) hypertension: Secondary | ICD-10-CM | POA: Diagnosis not present

## 2023-12-02 DIAGNOSIS — H34831 Tributary (branch) retinal vein occlusion, right eye, with macular edema: Secondary | ICD-10-CM | POA: Diagnosis not present

## 2023-12-02 DIAGNOSIS — H35033 Hypertensive retinopathy, bilateral: Secondary | ICD-10-CM | POA: Diagnosis not present

## 2023-12-02 MED ORDER — AFLIBERCEPT 2MG/0.05ML IZ SOLN FOR KALEIDOSCOPE
2.0000 mg | INTRAVITREAL | Status: AC | PRN
  Administered 2023-12-02: 2 mg via INTRAVITREAL

## 2023-12-03 DIAGNOSIS — K219 Gastro-esophageal reflux disease without esophagitis: Secondary | ICD-10-CM | POA: Diagnosis not present

## 2023-12-03 DIAGNOSIS — R0781 Pleurodynia: Secondary | ICD-10-CM | POA: Diagnosis not present

## 2023-12-03 DIAGNOSIS — G20A1 Parkinson's disease without dyskinesia, without mention of fluctuations: Secondary | ICD-10-CM | POA: Diagnosis not present

## 2023-12-03 DIAGNOSIS — R413 Other amnesia: Secondary | ICD-10-CM | POA: Diagnosis not present

## 2023-12-03 DIAGNOSIS — R296 Repeated falls: Secondary | ICD-10-CM | POA: Diagnosis not present

## 2023-12-03 DIAGNOSIS — M6281 Muscle weakness (generalized): Secondary | ICD-10-CM | POA: Diagnosis not present

## 2023-12-03 DIAGNOSIS — F02B3 Dementia in other diseases classified elsewhere, moderate, with mood disturbance: Secondary | ICD-10-CM | POA: Diagnosis not present

## 2023-12-14 ENCOUNTER — Telehealth: Payer: Self-pay | Admitting: Diagnostic Neuroimaging

## 2023-12-14 NOTE — Telephone Encounter (Signed)
 Pt's spouse is asking for a call to discuss pt always being sick on her stomach. Spouse states she eats maybe twice a day, he believes it has to do with the  carbidopa-levodopa (SINEMET IR) 25-100 MG tablet He is asking for something to be called in for her nausea and her shaking

## 2023-12-15 ENCOUNTER — Other Ambulatory Visit: Payer: Self-pay | Admitting: Neurology

## 2023-12-15 NOTE — Telephone Encounter (Signed)
 Called the husband back. States the last couple of weeks the patient has had a harder time. She has been taking the 2 carbidopa levodopa three times a day and since making that increase she has had a increase in GI upset/nausea. She is more tired and seems to be having more difficulty with waking up in the morning. She is having more difficulty with shaking. He has noticed also in last couple of weeks she is waking up in the middle night thinking needs to get ready.  She is already taking zofran for nausea, 8 mg three times a day. That helps some. Husband is asking if there is something to help with decreasing her shakes and with her memory. Pt has been off the exelon patch, they didn't find it effective.

## 2023-12-16 NOTE — Telephone Encounter (Signed)
 Reduce carb/levo to 1 tab three times a day, after 1-2 weeks, then stop (was used to help tremors; but not worth it if not helping and also causing side effects).  Nuplazid can help with hallucinations and delusions.   Unfortunately no medication can improve memory ability.   Suanne Marker, MD 12/16/2023, 10:33 AM Certified in Neurology, Neurophysiology and Neuroimaging  [facility] [address], [address] [place], [place] [address] [phone]

## 2023-12-16 NOTE — Telephone Encounter (Signed)
Patient informed with below note. 

## 2023-12-20 ENCOUNTER — Other Ambulatory Visit (INDEPENDENT_AMBULATORY_CARE_PROVIDER_SITE_OTHER)

## 2023-12-20 ENCOUNTER — Encounter: Payer: Self-pay | Admitting: Diagnostic Neuroimaging

## 2023-12-20 ENCOUNTER — Ambulatory Visit: Payer: HMO | Admitting: Nurse Practitioner

## 2023-12-20 ENCOUNTER — Encounter: Payer: Self-pay | Admitting: Nurse Practitioner

## 2023-12-20 VITALS — BP 124/70 | HR 73 | Ht 63.0 in | Wt 152.0 lb

## 2023-12-20 DIAGNOSIS — F322 Major depressive disorder, single episode, severe without psychotic features: Secondary | ICD-10-CM | POA: Diagnosis not present

## 2023-12-20 DIAGNOSIS — F02B3 Dementia in other diseases classified elsewhere, moderate, with mood disturbance: Secondary | ICD-10-CM | POA: Diagnosis not present

## 2023-12-20 DIAGNOSIS — F02B18 Dementia in other diseases classified elsewhere, moderate, with other behavioral disturbance: Secondary | ICD-10-CM | POA: Diagnosis not present

## 2023-12-20 DIAGNOSIS — K5909 Other constipation: Secondary | ICD-10-CM | POA: Diagnosis not present

## 2023-12-20 DIAGNOSIS — M1990 Unspecified osteoarthritis, unspecified site: Secondary | ICD-10-CM | POA: Diagnosis not present

## 2023-12-20 DIAGNOSIS — K5900 Constipation, unspecified: Secondary | ICD-10-CM | POA: Diagnosis not present

## 2023-12-20 DIAGNOSIS — G309 Alzheimer's disease, unspecified: Secondary | ICD-10-CM | POA: Diagnosis not present

## 2023-12-20 DIAGNOSIS — Z9049 Acquired absence of other specified parts of digestive tract: Secondary | ICD-10-CM | POA: Diagnosis not present

## 2023-12-20 DIAGNOSIS — G4733 Obstructive sleep apnea (adult) (pediatric): Secondary | ICD-10-CM | POA: Diagnosis not present

## 2023-12-20 DIAGNOSIS — M519 Unspecified thoracic, thoracolumbar and lumbosacral intervertebral disc disorder: Secondary | ICD-10-CM | POA: Diagnosis not present

## 2023-12-20 DIAGNOSIS — E785 Hyperlipidemia, unspecified: Secondary | ICD-10-CM | POA: Diagnosis not present

## 2023-12-20 DIAGNOSIS — K219 Gastro-esophageal reflux disease without esophagitis: Secondary | ICD-10-CM | POA: Diagnosis not present

## 2023-12-20 DIAGNOSIS — I739 Peripheral vascular disease, unspecified: Secondary | ICD-10-CM | POA: Diagnosis not present

## 2023-12-20 DIAGNOSIS — G20A1 Parkinson's disease without dyskinesia, without mention of fluctuations: Secondary | ICD-10-CM | POA: Diagnosis not present

## 2023-12-20 DIAGNOSIS — R110 Nausea: Secondary | ICD-10-CM | POA: Diagnosis not present

## 2023-12-20 DIAGNOSIS — R1030 Lower abdominal pain, unspecified: Secondary | ICD-10-CM

## 2023-12-20 DIAGNOSIS — Z860101 Personal history of adenomatous and serrated colon polyps: Secondary | ICD-10-CM

## 2023-12-20 DIAGNOSIS — I4510 Unspecified right bundle-branch block: Secondary | ICD-10-CM | POA: Diagnosis not present

## 2023-12-20 DIAGNOSIS — I10 Essential (primary) hypertension: Secondary | ICD-10-CM | POA: Diagnosis not present

## 2023-12-20 DIAGNOSIS — M8580 Other specified disorders of bone density and structure, unspecified site: Secondary | ICD-10-CM | POA: Diagnosis not present

## 2023-12-20 DIAGNOSIS — R296 Repeated falls: Secondary | ICD-10-CM | POA: Diagnosis not present

## 2023-12-20 DIAGNOSIS — Z87440 Personal history of urinary (tract) infections: Secondary | ICD-10-CM | POA: Diagnosis not present

## 2023-12-20 DIAGNOSIS — F02B4 Dementia in other diseases classified elsewhere, moderate, with anxiety: Secondary | ICD-10-CM | POA: Diagnosis not present

## 2023-12-20 LAB — CBC WITH DIFFERENTIAL/PLATELET
Basophils Absolute: 0.1 10*3/uL (ref 0.0–0.1)
Basophils Relative: 0.6 % (ref 0.0–3.0)
Eosinophils Absolute: 0.1 10*3/uL (ref 0.0–0.7)
Eosinophils Relative: 1.4 % (ref 0.0–5.0)
HCT: 38.9 % (ref 36.0–46.0)
Hemoglobin: 13.5 g/dL (ref 12.0–15.0)
Lymphocytes Relative: 24.7 % (ref 12.0–46.0)
Lymphs Abs: 2.5 10*3/uL (ref 0.7–4.0)
MCHC: 34.8 g/dL (ref 30.0–36.0)
MCV: 92.3 fl (ref 78.0–100.0)
Monocytes Absolute: 0.5 10*3/uL (ref 0.1–1.0)
Monocytes Relative: 5.3 % (ref 3.0–12.0)
Neutro Abs: 6.8 10*3/uL (ref 1.4–7.7)
Neutrophils Relative %: 68 % (ref 43.0–77.0)
Platelets: 400 10*3/uL (ref 150.0–400.0)
RBC: 4.21 Mil/uL (ref 3.87–5.11)
RDW: 12.4 % (ref 11.5–15.5)
WBC: 10 10*3/uL (ref 4.0–10.5)

## 2023-12-20 LAB — COMPREHENSIVE METABOLIC PANEL
ALT: 6 U/L (ref 0–35)
AST: 21 U/L (ref 0–37)
Albumin: 4.6 g/dL (ref 3.5–5.2)
Alkaline Phosphatase: 62 U/L (ref 39–117)
BUN: 26 mg/dL — ABNORMAL HIGH (ref 6–23)
CO2: 29 meq/L (ref 19–32)
Calcium: 10.3 mg/dL (ref 8.4–10.5)
Chloride: 94 meq/L — ABNORMAL LOW (ref 96–112)
Creatinine, Ser: 1.42 mg/dL — ABNORMAL HIGH (ref 0.40–1.20)
GFR: 34.82 mL/min — ABNORMAL LOW (ref >60.00)
Glucose, Bld: 111 mg/dL — ABNORMAL HIGH (ref 70–99)
Potassium: 4.3 meq/L (ref 3.5–5.1)
Sodium: 132 meq/L — ABNORMAL LOW (ref 135–145)
Total Bilirubin: 0.4 mg/dL (ref 0.2–1.2)
Total Protein: 7.3 g/dL (ref 6.0–8.3)

## 2023-12-20 LAB — TSH: TSH: 1.09 u[IU]/mL (ref 0.35–5.50)

## 2023-12-20 NOTE — Progress Notes (Signed)
 12/20/2023 Linda [name] [id number] April 17, 1941   Chief Complaint: Constipation abdominal pain   History of Present Illness: Linda [name] is a 83 year old female with a past medical history of anxiety, depression, hypertension, hypercholesterolemia, sleep apnea, obesity, GERD, anal fissure and colon polyps.  Previously followed by Dr. Russella Dar.  She is accompanied by her husband.  She endorses having lower abdominal pain which comes and goes, she is unable to tell me when this pain started or how often it occurs.  She has difficulty finishing sentences and has memory impairment.  Her husband stated she was diagnosed with Parkinson's disease and dementia.  She has some nausea without vomiting which occurs most mornings and sometimes persist throughout the day.  She passes a bowel movement every 3 days, stools are normal, hard or soft.  No bloody or black stools.  She denies having any abdominal pain during the night and is able to sleep well.  She is taking Metamucil daily.  Her appetite has decreased.  She stated she knows she needs to eat to live.  She has lost 6 pounds over the past 2 months.  No fevers or night sweats. Her most recent colonoscopy was 11/19/2020 which identified 6 tubular adenomatous polyps which were removed from the colon.  No further colonoscopies were recommended due to age.  She denies having any heartburn or dysphagia.  She remains on Omeprazole 40 mg daily.  No recent labs in Epic or Care Everywhere.  PAST GI PROCEDURES: EGD 11/19/2020: - No endoscopic esophageal abnormality to explain patient's dysphagia. Esophagus dilated. - Small hiatal hernia. - Normal duodenal bulb and second portion of the duodenum. - No specimens collected. - Follow up in GI office in 1 year   Colonoscopy 11/19/2020: Two 2 to 4 mm polyps in the cecum, removed with a cold biopsy forceps. Resected and retrieved. - Four 5 to 8 mm polyps in the transverse colon, removed with a cold snare.  Resected and retrieved. - The examination was otherwise normal on direct and retroflexion views. TUBULAR ADENOMA(S). - NO HIGH GRADE DYSPLASIA OR CARCINOMA  Current Outpatient Medications on File Prior to Visit  Medication Sig Dispense Refill   acetaminophen (TYLENOL) 325 MG tablet Take 650 mg by mouth every 6 (six) hours as needed.     ALPRAZolam (XANAX) 0.5 MG tablet Take 0.5 mg by mouth daily.     amLODipine (NORVASC) 5 MG tablet Take 1 tablet (5 mg total) by mouth daily.     benazepril (LOTENSIN) 40 MG tablet Take 40 mg by mouth daily.     calcium carbonate (TUMS - DOSED IN MG ELEMENTAL CALCIUM) 500 MG chewable tablet Chew 1 tablet by mouth daily.     Calcium-Vitamin D-Vitamin K (VIACTIV CALCIUM PLUS D PO) Take 2 tablets by mouth 2 (two) times daily.     carbidopa-levodopa (SINEMET IR) 25-100 MG tablet Take 2 tablets by mouth 3 (three) times daily. 180 tablet 6   desvenlafaxine (PRISTIQ) 100 MG 24 hr tablet Take 100 mg by mouth daily.      fosfomycin (MONUROL) 3 g PACK Take 3 g by mouth once.     lamoTRIgine (LAMICTAL) 150 MG tablet Take 300 mg by mouth daily.     loratadine (CLARITIN) 10 MG tablet Take 10 mg by mouth daily.     Multiple Vitamins-Minerals (ONE-A-DAY WOMENS 50+ ADVANTAGE) TABS Take 1 tablet by mouth daily.     omeprazole (PRILOSEC) 40 MG capsule TAKE ONE CAPSULE (40MG  TOTAL)  BY MOUTH DAILY 90 capsule 3   ondansetron (ZOFRAN) 8 MG tablet Take 1 tablet (8 mg total) by mouth every 8 (eight) hours as needed for nausea. 20 tablet 6   polyethylene glycol (MIRALAX) 17 g packet MiraLax     propranolol (INDERAL) 10 MG tablet Take 10 mg by mouth 2 (two) times daily.     rosuvastatin (CRESTOR) 20 MG tablet Take 1 tablet (20 mg total) by mouth daily. 90 tablet 3   spironolactone (ALDACTONE) 25 MG tablet TAKE ONE TABLET (25MG  TOTAL) BY MOUTH DAILY 90 tablet 3   triamcinolone cream (KENALOG) 0.1 % apply to the itching areas TID prn     No current facility-administered medications on  file prior to visit.   Allergies  Allergen Reactions   Amoxicillin Hives   Cefdinir Other (See Comments)   Other Other (See Comments)   Penicillin G Other (See Comments)   Abilify [Aripiprazole] Other (See Comments)    Makes pt tremble   Current Medications, Allergies, Past Medical History, Past Surgical History, Family History and Social History were reviewed in [employer] record.  Review of Systems:   Constitutional: Negative for fever, sweats, chills or weight loss.  Respiratory: Negative for shortness of breath.   Cardiovascular: Negative for chest pain, palpitations and leg swelling.  Gastrointestinal: See HPI.  Musculoskeletal: Negative for back pain or muscle aches.  Neurological: Negative for dizziness, headaches or paresthesias.   Physical Exam: Ht 5\' 3"  (1.6 m)   Wt 152 lb (68.9 kg)   BMI 26.93 kg/m   Wt Readings from Last 3 Encounters:  12/20/23 152 lb (68.9 kg)  11/22/23 159 lb (72.1 kg)  11/09/23 158 lb 3.2 oz (71.8 kg)    General: 83 year old female ambulates with a slow gait in no acute distress. Head: Normocephalic and atraumatic. Eyes: No scleral icterus. Conjunctiva pink . Ears: Normal auditory acuity. Mouth: Dentition intact. No ulcers or lesions.  Lungs: Clear throughout to auscultation. Heart: Regular rate and rhythm, no murmur. Abdomen: Soft, nontender and nondistended. No masses or hepatomegaly. Normal bowel sounds x 4 quadrants.  Rectal: Deferred. Musculoskeletal: Symmetrical with no gross deformities. Extremities: No edema. Neurological: Alert oriented x 4. No focal deficits.  Psychological: Alert and cooperative. Normal mood and affect  Assessment and Recommendations:   83 year old female with constipation and lower abdominal pain, no severe abdominal pain. -Increase water intake to 6 to 8 glasses daily -Continue Metamucil daily -MiraLAX nightly as tolerated -CBC, CMP and TSH -Husband to contact office if  abdominal pain or constipation persists or worsen -Patient instructed to go to the ED if she develops severe abdominal pain -Follow-up as needed  Nausea without vomiting -Treat constipation is noted above -Labs as ordered above -Ondansetron every 8 hours as needed as prescribed by PCP -Recommend eating 3-4 small snack size meals daily  History of colon polyps.  Colonoscopy 11/2020 identified 6 tubular adenomatous polyps removed from the colon. -No further colon polyp surveillance colonoscopies recommended due to age  GERD, stable -Continue Omeprazole 40 mg daily  Recently diagnosed with Parkinson's disease and dementia. On Carbidopa/Levodopa.  -Continue follow-up with neurology  Today's encounter was 25 minutes which included precharting, chart/result review, history/exam, face-to-face time used for counseling, formulating a treatment plan with follow-up and documentation.

## 2023-12-20 NOTE — Patient Instructions (Addendum)
 Metamucil- take in the morning  Miralax- take 1 capful mixed in 8 ounces of the clear liquid of your choice at bedtime  Increase water intake. (At least 6-8 glasses a day)  Your provider has requested that you go to the basement level for lab work before leaving today. Press "B" on the elevator. The lab is located at the first door on the left as you exit the elevator.  Contact our office if abdominal pain worsens.  Due to recent changes in healthcare laws, you may see the results of your imaging and laboratory studies on MyChart before your provider has had a chance to review them.  We understand that in some cases there may be results that are confusing or concerning to you. Not all laboratory results come back in the same time frame and the provider may be waiting for multiple results in order to interpret others.  Please give [place] 48 hours in order for your provider to thoroughly review all the results before contacting the office for clarification of your results.   Thank you for trusting me with your gastrointestinal care!   Alcide Evener, CRNP

## 2023-12-21 ENCOUNTER — Telehealth: Payer: Self-pay | Admitting: Diagnostic Neuroimaging

## 2023-12-21 MED ORDER — NUPLAZID 34 MG PO CAPS: 34.0000 mg | ORAL_CAPSULE | Freq: Every day | ORAL | 3 refills | Status: DC

## 2023-12-21 NOTE — Telephone Encounter (Signed)
 [facility] received prescription for Pimavanserin Tartrate (NUPLAZID) 34 MG CAPS.We were not allowed to fill prescription or order the medication. Believe would need to be done through a specialty pharmacy.

## 2023-12-21 NOTE — Telephone Encounter (Signed)
 Spoke to husband will come by office this week to  complete patient portion of form so we are abe to fax application for Nuplazid

## 2023-12-21 NOTE — Progress Notes (Signed)
 [facility] - Clinic Note  01/04/2024   CHIEF COMPLAINT Patient presents for Retina Follow Up  HISTORY OF PRESENT ILLNESS: Linda [name] is a 83 y.o. female who presents to the clinic today for:  HPI     Retina Follow Up   Patient presents with  CRVO/BRVO.  In right eye.  This started 4 weeks ago.  Duration of weeks.  Since onset it is stable.        Comments   4 week retina follow up I'VE OD pt is reporting no vision changes noticed she denies any flashes has some floaters       Last edited by Etheleen Mayhew, COT on 01/04/2024  8:03 AM.     Pt denies having any issues after the last injection.   Referring physician: Rodrigo Ran, MD [address] [place],  [place] [address]  HISTORICAL INFORMATION:  Selected notes from the MEDICAL RECORD NUMBER Referred by Dr. Charise Killian for BRVO OD LEE: 11.12.24, BCVA OD: CF@1 ', OS: 20/40-2 Ocular Hx- BRVO OD, Dry Eye Syndrome, pseudophakia OU PMH- HTN   CURRENT MEDICATIONS: No current outpatient medications on file. (Ophthalmic Drugs)   No current facility-administered medications for this visit. (Ophthalmic Drugs)   Current Outpatient Medications (Other)  Medication Sig   acetaminophen (TYLENOL) 325 MG tablet Take 650 mg by mouth every 6 (six) hours as needed.   ALPRAZolam (XANAX) 0.5 MG tablet Take 0.5 mg by mouth daily.   amLODipine (NORVASC) 5 MG tablet Take 1 tablet (5 mg total) by mouth daily.   benazepril (LOTENSIN) 40 MG tablet Take 40 mg by mouth daily.   calcium carbonate (TUMS - DOSED IN MG ELEMENTAL CALCIUM) 500 MG chewable tablet Chew 1 tablet by mouth daily.   Calcium-Vitamin D-Vitamin K (VIACTIV CALCIUM PLUS D PO) Take 2 tablets by mouth 2 (two) times daily.   carbidopa-levodopa (SINEMET IR) 25-100 MG tablet Take 2 tablets by mouth 3 (three) times daily.   ciprofloxacin (CIPRO) 500 MG tablet Take 1 tablet (500 mg total) by mouth every 12 (twelve) hours for 7 days.   desvenlafaxine  (PRISTIQ) 100 MG 24 hr tablet Take 100 mg by mouth daily.    fosfomycin (MONUROL) 3 g PACK Take 3 g by mouth once.   lamoTRIgine (LAMICTAL) 150 MG tablet Take 300 mg by mouth daily.   loratadine (CLARITIN) 10 MG tablet Take 10 mg by mouth daily.   Multiple Vitamins-Minerals (ONE-A-DAY WOMENS 50+ ADVANTAGE) TABS Take 1 tablet by mouth daily.   omeprazole (PRILOSEC) 40 MG capsule TAKE ONE CAPSULE (40MG  TOTAL) BY MOUTH DAILY   ondansetron (ZOFRAN) 8 MG tablet Take 1 tablet (8 mg total) by mouth every 8 (eight) hours as needed for nausea.   Pimavanserin Tartrate (NUPLAZID) 34 MG CAPS Take 1 capsule (34 mg total) by mouth daily.   polyethylene glycol (MIRALAX) 17 g packet MiraLax   propranolol (INDERAL) 10 MG tablet Take 10 mg by mouth 2 (two) times daily.   rosuvastatin (CRESTOR) 20 MG tablet Take 1 tablet (20 mg total) by mouth daily.   spironolactone (ALDACTONE) 25 MG tablet TAKE ONE TABLET (25MG  TOTAL) BY MOUTH DAILY   triamcinolone cream (KENALOG) 0.1 % apply to the itching areas TID prn   No current facility-administered medications for this visit. (Other)   REVIEW OF SYSTEMS: ROS   Positive for: Neurological, Musculoskeletal, Eyes Negative for: Cardiovascular Last edited by Etheleen Mayhew, COT on 01/04/2024  8:03 AM.      ALLERGIES  Allergies  Allergen Reactions   Amoxicillin Hives   Cefdinir Other (See Comments)   Other Other (See Comments)   Penicillin G Other (See Comments)   Abilify [Aripiprazole] Other (See Comments)    Makes pt tremble   PAST MEDICAL HISTORY Past Medical History:  Diagnosis Date   Anxiety    Candida esophagitis (HCC)    Chronic kidney disease    DDD (degenerative disc disease)    Depression    Erosive gastritis    Falls    GERD (gastroesophageal reflux disease)    Hiatal hernia    High cholesterol    History of anal fissures    Hypertension    Obesity    Osteoarthritis    Panic disorder    PONV (postoperative nausea and vomiting)     Sleep apnea    Does not need CPAP anymore   Past Surgical History:  Procedure Laterality Date   ABDOMINAL HYSTERECTOMY  1987   LSO / menorrhagia & leiomyomata   APPENDECTOMY     BREAST EXCISIONAL BIOPSY Left 1989   benign   BREAST EXCISIONAL BIOPSY Left 07/11/2020   benign   BREAST SURGERY  1989   benign tumor left breast   CHOLECYSTECTOMY  2001   TUBAL LIGATION     FAMILY HISTORY Family History  Problem Relation Age of Onset   Heart disease Mother    Lung cancer Father    Diabetes Sister    Hypertension Sister    Ovarian cancer Sister    Diabetes Brother    Heart disease Brother    Heart attack Brother    Heart disease Brother    Heart attack Brother    Heart disease Brother    Heart attack Brother    Colon cancer Neg Hx    BRCA 1/2 Neg Hx    Breast cancer Neg Hx    SOCIAL HISTORY Social History   Tobacco Use   Smoking status: Never    Passive exposure: Never   Smokeless tobacco: Never  Vaping Use   Vaping status: Never Used  Substance Use Topics   Alcohol use: No   Drug use: No       OPHTHALMIC EXAM:  Base Eye Exam     Visual Acuity (Snellen - Linear)       Right Left   Dist [place] 20/80 -1 20/30 -2   Dist ph [place] 20/70 -3 NI         Tonometry (Tonopen, 8:08 AM)       Right Left   Pressure 13 15         Pupils       Pupils Dark Light Shape React APD   Right PERRL 4 3 Round Brisk None   Left PERRL 4 3 Round Brisk None         Visual Fields       Left Right    Full Full         Extraocular Movement       Right Left    Full, Ortho Full, Ortho         Neuro/Psych     Oriented x3: Yes   Mood/Affect: Normal         Dilation     Both eyes: 2.5% Phenylephrine @ 8:08 AM           Slit Lamp and Fundus Exam     Slit Lamp Exam       Right Left   Lids/Lashes  Dermatochalasis - upper lid, mild MGD Dermatochalasis - upper lid, mild MGD   Conjunctiva/Sclera White and quiet White and quiet   Cornea well healed cataract  wound, tear film debris well healed cataract wound, tear film debris   Anterior Chamber deep and clear deep and clear   Iris Round and dilated Round and dilated   Lens PC IOL in good position PC IOL in good position   Anterior Vitreous  Posterior vitreous detachment         Fundus Exam       Right Left   Disc Pink and Sharp, mild tilt, temporal PPA mild Pallor, mild tilt, Compact, PPA   C/D Ratio 0.2 0.2   Macula Blunted foveal reflex, persistent central edema, confluent IRH / flame hemes superior macula -- mildly improved Flat, Blunted foveal reflex, RPE mottling, No heme or edema   Vessels attenuated, Tortuous, ST BRVO attenuated, Tortuous   Periphery Attached, +DBH superior quadrant, peripheral drusen, mild reticular degeneration Attached, mild reticular degeneration, No heme           Refraction     Wearing Rx       Sphere Cylinder Axis Add   Right -0.50 +0.25 085 +2.50   Left -1.50 +0.75 055 +2.50           IMAGING AND PROCEDURES  Imaging and Procedures for 01/04/2024  OCT, Retina - OU - Both Eyes       Right Eye Quality was good. Central Foveal Thickness: 427. Progression has been stable. Findings include no SRF, abnormal foveal contour, intraretinal hyper-reflective material, epiretinal membrane, intraretinal fluid (Persistent central edema with superior extension, partial PVD).   Left Eye Quality was good. Central Foveal Thickness: 315. Progression has been stable. Findings include normal foveal contour, no IRF, no SRF.   Notes *Images captured and stored on drive  Diagnosis / Impression:  OD: Persistent central edema with superior extension, partial PVD OS: NFP, no IRF/SRF  Clinical management:  See below  Abbreviations: NFP - Normal foveal profile. CME - cystoid macular edema. PED - pigment epithelial detachment. IRF - intraretinal fluid. SRF - subretinal fluid. EZ - ellipsoid zone. ERM - epiretinal membrane. ORA - outer retinal atrophy. ORT - outer  retinal tubulation. SRHM - subretinal hyper-reflective material. IRHM - intraretinal hyper-reflective material            ASSESSMENT/PLAN:   ICD-10-CM   1. Branch retinal vein occlusion of right eye with macular edema  H34.8310 OCT, Retina - OU - Both Eyes    2. Essential hypertension  I10     3. Hypertensive retinopathy of both eyes  H35.033     4. Pseudophakia, both eyes  Z96.1      1. BRVO w/ CME OD  - s/p IVA OD #1 (11.21.24), #2 (12.19.24), #3 (01.16.25) IVA resistance  ==========================  - s/p IVE OD #1 (02.13.25) - BCVA OD 20/150 -- stable - exam shows IRH superior macula w/ extension of DBH to superior periphery -- improving - OCT shows OD: Interval improvement in central edema with superior extension, partial PVD **discussed decreased efficacy / resistance to Avastin and potential benefit of switching medication** - recommend IVE OD #2 today, 03.18.25 - pt wishes to proceed with injection and cover 20% coinsurance for medication - RBA of procedure discussed, questions answered - IVE informed consent obtained and signed, 02.13.25 (OD) - see procedure note - F/U 4 weeks -- DFE/OCT/possible injection  2,3. Hypertensive retinopathy OU - discussed importance of tight BP  control - monitor  4. Pseudophakia OU  - s/p CE/IOL OU (March 2023; Dr. June Leap)  - IOL in good position, doing well  - monitor  Ophthalmic Meds Ordered this visit:  No orders of the defined types were placed in this encounter.   Return in about 4 weeks (around 02/01/2024) for f/u BRVO OD, DFE, OCT, Possible, IVE, OD.  There are no Patient Instructions on file for this visit.  This document serves as a record of services personally performed by Karie Chimera, MD, PhD. It was created on their behalf by Charlette Caffey, COT an ophthalmic technician. The creation of this record is the provider's dictation and/or activities during the visit.    Electronically signed by:  Charlette Caffey, COT  01/04/24 8:35 AM  Karie Chimera, M.D., Ph.D. Diseases & Surgery of the Retina and Vitreous [facility]    Abbreviations: M myopia (nearsighted); A astigmatism; H hyperopia (farsighted); P presbyopia; Mrx spectacle prescription;  CTL contact lenses; OD right eye; OS left eye; OU both eyes  XT exotropia; ET esotropia; PEK punctate epithelial keratitis; PEE punctate epithelial erosions; DES dry eye syndrome; MGD meibomian gland dysfunction; ATs artificial tears; PFAT's preservative free artificial tears; NSC nuclear sclerotic cataract; PSC posterior subcapsular cataract; ERM epi-retinal membrane; PVD posterior vitreous detachment; RD retinal detachment; DM diabetes mellitus; DR diabetic retinopathy; NPDR non-proliferative diabetic retinopathy; PDR proliferative diabetic retinopathy; CSME clinically significant macular edema; DME diabetic macular edema; dbh dot blot hemorrhages; CWS cotton wool spot; POAG primary open angle glaucoma; C/D cup-to-disc ratio; HVF humphrey visual field; GVF goldmann visual field; OCT optical coherence tomography; IOP intraocular pressure; BRVO Branch retinal vein occlusion; CRVO central retinal vein occlusion; CRAO central retinal artery occlusion; BRAO branch retinal artery occlusion; RT retinal tear; SB scleral buckle; PPV pars plana vitrectomy; VH Vitreous hemorrhage; PRP panretinal laser photocoagulation; IVK intravitreal kenalog; VMT vitreomacular traction; MH Macular hole;  NVD neovascularization of the disc; NVE neovascularization elsewhere; AREDS age related eye disease study; ARMD age related macular degeneration; POAG primary open angle glaucoma; EBMD epithelial/anterior basement membrane dystrophy; ACIOL anterior chamber intraocular lens; IOL intraocular lens; PCIOL posterior chamber intraocular lens; Phaco/IOL phacoemulsification with intraocular lens placement; PRK photorefractive keratectomy; LASIK laser assisted in situ  keratomileusis; HTN hypertension; DM diabetes mellitus; COPD chronic obstructive pulmonary disease

## 2023-12-21 NOTE — Telephone Encounter (Signed)
 We are aware and have completed the paper referral to get it set up for the pt. Script will be forward [facility] to dispense to specialty pharmacy

## 2023-12-22 NOTE — Telephone Encounter (Signed)
 Script was faxed on 12/21/23 to [facility] and received confirmation that it went through.

## 2023-12-23 ENCOUNTER — Encounter: Payer: Self-pay | Admitting: Physician Assistant

## 2023-12-23 ENCOUNTER — Ambulatory Visit: Payer: Medicare HMO | Admitting: Physician Assistant

## 2023-12-23 ENCOUNTER — Telehealth: Payer: Self-pay | Admitting: Neurology

## 2023-12-23 DIAGNOSIS — M170 Bilateral primary osteoarthritis of knee: Secondary | ICD-10-CM

## 2023-12-23 DIAGNOSIS — M1711 Unilateral primary osteoarthritis, right knee: Secondary | ICD-10-CM

## 2023-12-23 DIAGNOSIS — M1712 Unilateral primary osteoarthritis, left knee: Secondary | ICD-10-CM

## 2023-12-23 MED ORDER — LIDOCAINE HCL 1 % IJ SOLN
3.0000 mL | INTRAMUSCULAR | Status: AC | PRN
  Administered 2023-12-23: 3 mL

## 2023-12-23 MED ORDER — LIDOCAINE HCL 1 % IJ SOLN
5.0000 mL | INTRAMUSCULAR | Status: AC | PRN
  Administered 2023-12-23: 5 mL

## 2023-12-23 MED ORDER — METHYLPREDNISOLONE ACETATE 40 MG/ML IJ SUSP
40.0000 mg | INTRAMUSCULAR | Status: AC | PRN
  Administered 2023-12-23: 40 mg via INTRA_ARTICULAR

## 2023-12-23 NOTE — Telephone Encounter (Signed)
 Received a form indication that the patient has [employer] approval for a grant through the neurocognitive disease with psychosis   I will fax this to [facility] so they can make sure the appropriate specialty pharmacy gets it

## 2023-12-23 NOTE — Progress Notes (Signed)
   Procedure Note  Patient: Linda [name]             Date of Birth: 08-Sep-1941           MRN: [id number]             Visit Date: 12/23/2023 HPI: Mr. Linda [name] comes in today for bilateral knee injections.  She states the injections on 09/23/2023 were very helpful.  She notes some swelling left knee.  She has known osteoarthritis both knees.  No fevers chills.  She is taking Tylenol for knee pain.  Review of systems: See HPI otherwise negative  Physical exam: General Well-developed well-nourished female no acute distress able to get on and off the exam table on her own.  She does use a rollator to walk long distances. Bilateral knees: Good range of motion both knees no abnormal warmth or erythema of either knee.  Slight effusion left knee only.  Procedures: Visit Diagnoses:  1. Primary osteoarthritis of right knee   2. Primary osteoarthritis of left knee     Large Joint Inj: bilateral knee on 12/23/2023 1:40 PM Indications: pain Details: 22 G 1.5 in needle, superolateral approach  Arthrogram: No  Medications (Right): 3 mL lidocaine 1 %; 40 mg methylPREDNISolone acetate 40 MG/ML Medications (Left): 5 mL lidocaine 1 %; 40 mg methylPREDNISolone acetate 40 MG/ML Aspirate (Left): 2 mL yellow Outcome: tolerated well, no immediate complications Procedure, treatment alternatives, risks and benefits explained, specific risks discussed. Consent was given by the patient. Immediately prior to procedure a time out was called to verify the correct patient, procedure, equipment, support staff and site/side marked as required. Patient was prepped and draped in the usual sterile fashion.     Plan: She will follow-up with [place] in 3 months for repeat injections with cortisone.  Questions were encouraged and answered.

## 2023-12-27 ENCOUNTER — Other Ambulatory Visit: Payer: Self-pay | Admitting: Neurology

## 2023-12-27 NOTE — Telephone Encounter (Signed)
 Received a separate notification advising that script will be processed through [facility] for the pt

## 2023-12-27 NOTE — Telephone Encounter (Signed)
 Received a auth approval for the nuplazid medication for the patient as well. The nuplazid mg capsule is approved for a year through [employer].

## 2023-12-28 DIAGNOSIS — R630 Anorexia: Secondary | ICD-10-CM | POA: Diagnosis not present

## 2023-12-28 DIAGNOSIS — E871 Hypo-osmolality and hyponatremia: Secondary | ICD-10-CM | POA: Diagnosis not present

## 2023-12-28 DIAGNOSIS — R1030 Lower abdominal pain, unspecified: Secondary | ICD-10-CM | POA: Diagnosis not present

## 2023-12-28 NOTE — Progress Notes (Signed)
 Agree with the assessment and plan as outlined by Alcide Evener, NP.   Doristine Locks, DO, [facility]

## 2023-12-29 ENCOUNTER — Other Ambulatory Visit: Payer: Self-pay | Admitting: Registered Nurse

## 2023-12-29 DIAGNOSIS — R630 Anorexia: Secondary | ICD-10-CM

## 2023-12-29 DIAGNOSIS — R1030 Lower abdominal pain, unspecified: Secondary | ICD-10-CM

## 2023-12-30 ENCOUNTER — Emergency Department (HOSPITAL_COMMUNITY)

## 2023-12-30 ENCOUNTER — Encounter (HOSPITAL_COMMUNITY): Payer: Self-pay

## 2023-12-30 ENCOUNTER — Emergency Department (HOSPITAL_COMMUNITY)
Admission: EM | Admit: 2023-12-30 | Discharge: 2023-12-30 | Disposition: A | Discharge status: Home / Self Care | Attending: Emergency Medicine | Admitting: Emergency Medicine

## 2023-12-30 ENCOUNTER — Other Ambulatory Visit: Payer: Self-pay

## 2023-12-30 DIAGNOSIS — N3000 Acute cystitis without hematuria: Secondary | ICD-10-CM | POA: Insufficient documentation

## 2023-12-30 DIAGNOSIS — G20C Parkinsonism, unspecified: Secondary | ICD-10-CM | POA: Insufficient documentation

## 2023-12-30 DIAGNOSIS — K429 Umbilical hernia without obstruction or gangrene: Secondary | ICD-10-CM | POA: Diagnosis not present

## 2023-12-30 DIAGNOSIS — Z9049 Acquired absence of other specified parts of digestive tract: Secondary | ICD-10-CM | POA: Diagnosis not present

## 2023-12-30 DIAGNOSIS — I129 Hypertensive chronic kidney disease with stage 1 through stage 4 chronic kidney disease, or unspecified chronic kidney disease: Secondary | ICD-10-CM | POA: Insufficient documentation

## 2023-12-30 DIAGNOSIS — R1030 Lower abdominal pain, unspecified: Secondary | ICD-10-CM | POA: Diagnosis not present

## 2023-12-30 DIAGNOSIS — R109 Unspecified abdominal pain: Secondary | ICD-10-CM | POA: Diagnosis present

## 2023-12-30 DIAGNOSIS — N9489 Other specified conditions associated with female genital organs and menstrual cycle: Secondary | ICD-10-CM

## 2023-12-30 DIAGNOSIS — N189 Chronic kidney disease, unspecified: Secondary | ICD-10-CM | POA: Insufficient documentation

## 2023-12-30 LAB — COMPREHENSIVE METABOLIC PANEL
ALT: 27 U/L (ref 0–44)
AST: 27 U/L (ref 15–41)
Albumin: 4 g/dL (ref 3.5–5.0)
Alkaline Phosphatase: 56 U/L (ref 38–126)
Anion gap: 9 (ref 5–15)
BUN: 22 mg/dL (ref 8–23)
CO2: 29 mmol/L (ref 22–32)
Calcium: 9.4 mg/dL (ref 8.9–10.3)
Chloride: 92 mmol/L — ABNORMAL LOW (ref 98–111)
Creatinine, Ser: 1.24 mg/dL — ABNORMAL HIGH (ref 0.44–1.00)
GFR, Estimated: 44 mL/min — ABNORMAL LOW (ref >60)
Glucose, Bld: 109 mg/dL — ABNORMAL HIGH (ref 70–99)
Potassium: 4.2 mmol/L (ref 3.5–5.1)
Sodium: 130 mmol/L — ABNORMAL LOW (ref 135–145)
Total Bilirubin: 0.8 mg/dL (ref 0.0–1.2)
Total Protein: 6.7 g/dL (ref 6.5–8.1)

## 2023-12-30 LAB — CBC WITH DIFFERENTIAL/PLATELET
Abs Immature Granulocytes: 0.06 10*3/uL (ref 0.00–0.07)
Basophils Absolute: 0.1 10*3/uL (ref 0.0–0.1)
Basophils Relative: 1 %
Eosinophils Absolute: 0.2 10*3/uL (ref 0.0–0.5)
Eosinophils Relative: 2 %
HCT: 37.4 % (ref 36.0–46.0)
Hemoglobin: 12.7 g/dL (ref 12.0–15.0)
Immature Granulocytes: 1 %
Lymphocytes Relative: 28 %
Lymphs Abs: 2.4 10*3/uL (ref 0.7–4.0)
MCH: 31.4 pg (ref 26.0–34.0)
MCHC: 34 g/dL (ref 30.0–36.0)
MCV: 92.3 fL (ref 80.0–100.0)
Monocytes Absolute: 0.7 10*3/uL (ref 0.1–1.0)
Monocytes Relative: 8 %
Neutro Abs: 5.2 10*3/uL (ref 1.7–7.7)
Neutrophils Relative %: 60 %
Platelets: 347 10*3/uL (ref 150–400)
RBC: 4.05 MIL/uL (ref 3.87–5.11)
RDW: 11.9 % (ref 11.5–15.5)
WBC: 8.5 10*3/uL (ref 4.0–10.5)
nRBC: 0 % (ref 0.0–0.2)

## 2023-12-30 LAB — URINALYSIS, ROUTINE W REFLEX MICROSCOPIC
Bacteria, UA: NONE SEEN
Bilirubin Urine: NEGATIVE
Glucose, UA: NEGATIVE mg/dL
Ketones, ur: NEGATIVE mg/dL
Nitrite: POSITIVE — AB
Protein, ur: NEGATIVE mg/dL
Specific Gravity, Urine: 1.016 (ref 1.005–1.030)
pH: 7 (ref 5.0–8.0)

## 2023-12-30 LAB — LIPASE, BLOOD: Lipase: 43 U/L (ref 11–51)

## 2023-12-30 MED ORDER — CIPROFLOXACIN HCL 500 MG PO TABS: 500.0000 mg | ORAL_TABLET | Freq: Two times a day (BID) | ORAL | 0 refills | Status: AC

## 2023-12-30 MED ORDER — IOHEXOL 300 MG/ML  SOLN
80.0000 mL | Freq: Once | INTRAMUSCULAR | Status: AC | PRN
  Administered 2023-12-30: 80 mL via INTRAVENOUS

## 2023-12-30 MED ORDER — CIPROFLOXACIN HCL 250 MG PO TABS
500.0000 mg | ORAL_TABLET | ORAL | Status: AC
  Administered 2023-12-30: 500 mg via ORAL
  Filled 2023-12-30: qty 2

## 2023-12-30 NOTE — ED Triage Notes (Signed)
 Pt complaining of abdominal pain in the mid stomach, that has been going on for the last couple of weeks. Makes her nauseous at times.  She is also having lower back pain that has been getting worse over the last week.

## 2023-12-30 NOTE — ED Notes (Signed)
Pt reminded that we need a urine sample.  

## 2023-12-30 NOTE — Discharge Instructions (Signed)
 You were seen for your abdominal pain and urinary tract infection in the emergency department.   At home, please take Tylenol for your pain.  Take the ciprofloxacin for urinary tract infection.    Check your MyChart online for the results of any tests that had not resulted by the time you left the emergency department.   Follow-up with your primary doctor in 2-3 days regarding your visit.  Follow-up with OB/GYN in several weeks for additional imaging of the mass that was found near your right ovary.  Return immediately to the emergency department if you experience any of the following: Severe pain, fever, or any other concerning symptoms.    Thank you for visiting our Emergency Department. It was a pleasure taking care of you today.

## 2023-12-30 NOTE — ED Notes (Signed)
 Pt stated she needed to urinate. Staff put pt on bedpan and pt had already used the bathroom.

## 2023-12-30 NOTE — ED Notes (Signed)
 Bladder scan done >447ml

## 2023-12-30 NOTE — ED Provider Notes (Signed)
 [place] EM[facility] DE[facility] AT [facility] Provider Note   CSN: [id number] Arrival date & time: 12/30/23  4098     History  Chief Complaint  Patient presents with   Abdominal Pain    Linda [name] is a 83 y.o. female.  HPI     This is an 83 year old female who presents with acute on chronic abdominal pain.  She has a history of Parkinson's and some dementia.  She has difficulty relating history but reports abdominal and back discomfort for "some time."  Husband indicates it has been weeks to months.  She has been nauseous but has not had vomiting.  Reports normal bowel movements.  Denies any urinary symptoms or fevers.  She was seen at her primary doctor and a CT scan was ordered but she has not had that yet.  She has not taken anything for her pain.  Home Medications Prior to Admission medications   Medication Sig Start Date End Date Taking? Authorizing Provider  acetaminophen (TYLENOL) 325 MG tablet Take 650 mg by mouth every 6 (six) hours as needed.    [provider]  ALPRAZolam Prudy Feeler) 0.5 MG tablet Take 0.5 mg by mouth daily. 08/20/20   [provider]  amLODipine (NORVASC) 5 MG tablet Take 1 tablet (5 mg total) by mouth daily. 11/09/23   Parke Poisson, MD  benazepril (LOTENSIN) 40 MG tablet Take 40 mg by mouth daily.    [provider]  calcium carbonate (TUMS - DOSED IN MG ELEMENTAL CALCIUM) 500 MG chewable tablet Chew 1 tablet by mouth daily.    [provider]  Calcium-Vitamin D-Vitamin K (VIACTIV CALCIUM PLUS D PO) Take 2 tablets by mouth 2 (two) times daily.    [provider]  carbidopa-levodopa (SINEMET IR) 25-100 MG tablet Take 2 tablets by mouth 3 (three) times daily. 11/22/23   Penumalli, Glenford Bayley, MD  desvenlafaxine (PRISTIQ) 100 MG 24 hr tablet Take 100 mg by mouth daily.  05/03/18   [provider]  fosfomycin (MONUROL) 3 g PACK Take 3 g by mouth once. 07/09/23   [provider]   lamoTRIgine (LAMICTAL) 150 MG tablet Take 300 mg by mouth daily. 12/10/21   [provider]  loratadine (CLARITIN) 10 MG tablet Take 10 mg by mouth daily.    [provider]  Multiple Vitamins-Minerals (ONE-A-DAY WOMENS 50+ ADVANTAGE) TABS Take 1 tablet by mouth daily. 03/13/14   [provider]  omeprazole (PRILOSEC) 40 MG capsule TAKE ONE CAPSULE (40MG  TOTAL) BY MOUTH DAILY 10/21/23   Hilarie Fredrickson, MD  ondansetron (ZOFRAN) 8 MG tablet Take 1 tablet (8 mg total) by mouth every 8 (eight) hours as needed for nausea. 01/07/17   Esterwood, Amy S, PA-C  Pimavanserin Tartrate (NUPLAZID) 34 MG CAPS Take 1 capsule (34 mg total) by mouth daily. 12/21/23   Penumalli, Glenford Bayley, MD  polyethylene glycol (MIRALAX) 17 g packet MiraLax    [provider]  propranolol (INDERAL) 10 MG tablet Take 10 mg by mouth 2 (two) times daily. 11/11/21   [provider]  rosuvastatin (CRESTOR) 20 MG tablet Take 1 tablet (20 mg total) by mouth daily. 02/04/22   Parke Poisson, MD  spironolactone (ALDACTONE) 25 MG tablet TAKE ONE TABLET (25MG  TOTAL) BY MOUTH DAILY 05/04/23   Parke Poisson, MD  triamcinolone cream (KENALOG) 0.1 % apply to the itching areas TID prn 02/20/19   [provider]      Allergies  Amoxicillin, Cefdinir, Other, Penicillin g, and Abilify [aripiprazole]    Review of Systems   Review of Systems  Constitutional:  Negative for fever.  Respiratory:  Negative for shortness of breath.   Cardiovascular:  Negative for chest pain.  Gastrointestinal:  Positive for abdominal pain and nausea. Negative for diarrhea and vomiting.  Musculoskeletal:  Positive for back pain.  All other systems reviewed and are negative.   Physical Exam Updated Vital Signs BP (!) 159/64   Pulse 69   Temp 98.2 F (36.8 C) (Oral)   Resp 18   Ht 1.6 m (5\' 3" )   Wt 68.9 kg   SpO2 96%   BMI 26.93 kg/m  Physical Exam Vitals and nursing note reviewed.  Constitutional:       Appearance: She is well-developed. She is obese. She is not ill-appearing.  HENT:     Head: Normocephalic and atraumatic.     Mouth/Throat:     Mouth: Mucous membranes are moist.  Eyes:     Pupils: Pupils are equal, round, and reactive to light.  Cardiovascular:     Rate and Rhythm: Normal rate and regular rhythm.     Heart sounds: Normal heart sounds.  Pulmonary:     Effort: Pulmonary effort is normal. No respiratory distress.     Breath sounds: No wheezing.  Abdominal:     General: Bowel sounds are normal.     Palpations: Abdomen is soft.     Tenderness: There is no abdominal tenderness. There is no guarding or rebound.  Musculoskeletal:     Cervical back: Neck supple.  Skin:    General: Skin is warm and dry.  Neurological:     Mental Status: She is alert and oriented to person, place, and time.     Comments: Slowed speech  Psychiatric:     Comments: Flat affect     ED Results / Procedures / Treatments   Labs (all labs ordered are listed, but only abnormal results are displayed) Labs Reviewed  CBC WITH DIFFERENTIAL/PLATELET  COMPREHENSIVE METABOLIC PANEL  LIPASE, BLOOD  URINALYSIS, ROUTINE W REFLEX MICROSCOPIC    EKG None  Radiology No results found.  Procedures Procedures    Medications Ordered in ED Medications - No data to display  ED Course/ Medical Decision Making/ A&P                                 Medical Decision Making Amount and/or Complexity of Data Reviewed Labs: ordered.   This patient presents to the ED for concern of abdominal pain, this involves an extensive number of treatment options, and is a complaint that carries with it a high risk of complications and morbidity.  I considered the following differential and admission for this acute, potentially life threatening condition.  The differential diagnosis includes gastritis, gastroenteritis, appendicitis, cholecystitis, AAA, UTI, kidney stone  MDM:    This is an 83 year old female  who presents with abdominal pain..  Acute on chronic.  She has difficulty providing history but overall she is nontoxic and vital signs are notable for blood pressure 159/64.  Her abdominal exam is fairly benign.  Given chronicity, have lower suspicion for emergent process such as AAA.  Will obtain some basic lab work.  I have reviewed her lab work from early March.  GFR was 34 at that time.  Will need to obtain repeat CMP before ordering CT scan.  Discussed with the patient  and her spouse that I would order CT imaging.  (Labs, imaging, consults)  Labs: I Ordered, and personally interpreted labs.  The pertinent results include: CBC, CMP, lipase, urinalysis pending  Imaging Studies ordered: I ordered imaging studies including CT I independently visualized and interpreted imaging. I agree with the radiologist interpretation  Additional history obtained from chart review.  External records from outside source obtained and reviewed including prior labs  Cardiac Monitoring: The patient was maintained on a cardiac monitor.  If on the cardiac monitor, I personally viewed and interpreted the cardiac monitored which showed an underlying rhythm of: Sinus  Reevaluation: After the interventions noted above, I reevaluated the patient and found that they have :stayed the same  Social Determinants of Health:  lives with husband  Disposition: Pending, signed out to oncoming provider  Co morbidities that complicate the patient evaluation  Past Medical History:  Diagnosis Date   Anxiety    Candida esophagitis (HCC)    Chronic kidney disease    DDD (degenerative disc disease)    Depression    Erosive gastritis    Falls    GERD (gastroesophageal reflux disease)    Hiatal hernia    High cholesterol    History of anal fissures    Hypertension    Obesity    Osteoarthritis    Panic disorder    PONV (postoperative nausea and vomiting)    Sleep apnea    Does not need CPAP anymore      Medicines No orders of the defined types were placed in this encounter.   I have reviewed the patients home medicines and have made adjustments as needed  Problem List / ED Course: Problem List Items Addressed This Visit   None               Final Clinical Impression(s) / ED Diagnoses Final diagnoses:  None    Rx / DC Orders ED Discharge Orders     None         Zekiel Torian, Mayer Masker, MD 12/30/23 [phone]

## 2023-12-30 NOTE — ED Provider Notes (Signed)
  Physical Exam  BP (!) 139/53   Pulse 71   Temp 98.2 F (36.8 C) (Oral)   Resp 15   Ht 5\' 3"  (1.6 m)   Wt 68.9 kg   SpO2 98%   BMI 26.93 kg/m   Physical Exam  Procedures  Procedures  ED Course / MDM   Clinical Course as of 12/30/23 1021  Thu Dec 30, 2023  0700 Assumed care from Dr Wilkie Aye. 83 yo F with parkinson's and dementia who presented with abdominal pain and back pain for weeks to months. Mostly lower abdominal pain but no TTP on exam. Awaiting chem before ordering CT scan with contrast.  [RP]  0920 CT ABDOMEN PELVIS W CONTRAST No acute findings. Does show possible mass on R fallopian tube.  [RP]  1021 Patient has been notified that she does have a UTI.  In the past has grown pansensitive E. coli.  Counseled to take Tylenol for her pain and ciprofloxacin for her urinary tract infection.  They were also notified of the incidental right adnexal mass.  Counseled to follow-up with her primary doctor and GYN for additional evaluation. [RP]    Clinical Course User Index [RP] Rondel Baton, MD   Medical Decision Making Amount and/or Complexity of Data Reviewed Labs: ordered. Radiology: ordered. Decision-making details documented in ED Course.  Risk Prescription drug management.      Rondel Baton, MD 12/30/23 1021

## 2024-01-03 ENCOUNTER — Telehealth: Payer: Self-pay | Admitting: Diagnostic Neuroimaging

## 2024-01-03 NOTE — Telephone Encounter (Signed)
 Heather from [facility] called and LVM stating that the pt's Nuplazid is supposed to be shipped today and she is needing to speak to someone about a interaction with another medication that she is on ciprofloxacin (CIPRO) 500 MG tablet. She is needing to speak to someone to clear that up today or she will not be able to send out today. Please advise.

## 2024-01-03 NOTE — Telephone Encounter (Signed)
 Called the pharmacy to advise the pt can proceed forward with having the nuplazid shipped. She is only scheduled to be temporarily on cipro and will not be taken the two together long term.

## 2024-01-04 ENCOUNTER — Ambulatory Visit (INDEPENDENT_AMBULATORY_CARE_PROVIDER_SITE_OTHER): Payer: HMO | Admitting: Ophthalmology

## 2024-01-04 ENCOUNTER — Encounter (INDEPENDENT_AMBULATORY_CARE_PROVIDER_SITE_OTHER): Payer: Self-pay | Admitting: Ophthalmology

## 2024-01-04 DIAGNOSIS — I10 Essential (primary) hypertension: Secondary | ICD-10-CM

## 2024-01-04 DIAGNOSIS — Z961 Presence of intraocular lens: Secondary | ICD-10-CM

## 2024-01-04 DIAGNOSIS — H34831 Tributary (branch) retinal vein occlusion, right eye, with macular edema: Secondary | ICD-10-CM | POA: Diagnosis not present

## 2024-01-04 DIAGNOSIS — H35033 Hypertensive retinopathy, bilateral: Secondary | ICD-10-CM

## 2024-01-04 MED ORDER — AFLIBERCEPT 2MG/0.05ML IZ SOLN FOR KALEIDOSCOPE
2.0000 mg | INTRAVITREAL | Status: AC | PRN
  Administered 2024-01-04: 2 mg via INTRAVITREAL

## 2024-01-06 ENCOUNTER — Other Ambulatory Visit (HOSPITAL_COMMUNITY): Payer: Self-pay | Admitting: Family Medicine

## 2024-01-06 DIAGNOSIS — K219 Gastro-esophageal reflux disease without esophagitis: Secondary | ICD-10-CM | POA: Diagnosis not present

## 2024-01-06 DIAGNOSIS — I739 Peripheral vascular disease, unspecified: Secondary | ICD-10-CM | POA: Diagnosis not present

## 2024-01-06 DIAGNOSIS — I4510 Unspecified right bundle-branch block: Secondary | ICD-10-CM | POA: Diagnosis not present

## 2024-01-06 DIAGNOSIS — I10 Essential (primary) hypertension: Secondary | ICD-10-CM | POA: Diagnosis not present

## 2024-01-06 DIAGNOSIS — R630 Anorexia: Secondary | ICD-10-CM | POA: Diagnosis not present

## 2024-01-06 DIAGNOSIS — F02B3 Dementia in other diseases classified elsewhere, moderate, with mood disturbance: Secondary | ICD-10-CM | POA: Diagnosis not present

## 2024-01-06 DIAGNOSIS — R1030 Lower abdominal pain, unspecified: Secondary | ICD-10-CM | POA: Diagnosis not present

## 2024-01-06 DIAGNOSIS — G309 Alzheimer's disease, unspecified: Secondary | ICD-10-CM | POA: Diagnosis not present

## 2024-01-06 DIAGNOSIS — F02B4 Dementia in other diseases classified elsewhere, moderate, with anxiety: Secondary | ICD-10-CM | POA: Diagnosis not present

## 2024-01-06 DIAGNOSIS — F02B18 Dementia in other diseases classified elsewhere, moderate, with other behavioral disturbance: Secondary | ICD-10-CM | POA: Diagnosis not present

## 2024-01-06 DIAGNOSIS — G20A1 Parkinson's disease without dyskinesia, without mention of fluctuations: Secondary | ICD-10-CM | POA: Diagnosis not present

## 2024-01-06 DIAGNOSIS — N9489 Other specified conditions associated with female genital organs and menstrual cycle: Secondary | ICD-10-CM | POA: Diagnosis not present

## 2024-01-06 DIAGNOSIS — F322 Major depressive disorder, single episode, severe without psychotic features: Secondary | ICD-10-CM | POA: Diagnosis not present

## 2024-01-06 DIAGNOSIS — N390 Urinary tract infection, site not specified: Secondary | ICD-10-CM | POA: Diagnosis not present

## 2024-01-06 DIAGNOSIS — M519 Unspecified thoracic, thoracolumbar and lumbosacral intervertebral disc disorder: Secondary | ICD-10-CM | POA: Diagnosis not present

## 2024-01-06 DIAGNOSIS — G4733 Obstructive sleep apnea (adult) (pediatric): Secondary | ICD-10-CM | POA: Diagnosis not present

## 2024-01-06 DIAGNOSIS — R319 Hematuria, unspecified: Secondary | ICD-10-CM | POA: Diagnosis not present

## 2024-01-06 DIAGNOSIS — E871 Hypo-osmolality and hyponatremia: Secondary | ICD-10-CM | POA: Diagnosis not present

## 2024-01-13 ENCOUNTER — Ambulatory Visit (HOSPITAL_COMMUNITY)
Admission: RE | Admit: 2024-01-13 | Discharge: 2024-01-13 | Disposition: A | Discharge status: Home / Self Care | Source: Ambulatory Visit | Attending: Family Medicine | Admitting: Family Medicine

## 2024-01-13 ENCOUNTER — Other Ambulatory Visit (HOSPITAL_COMMUNITY): Payer: Self-pay | Admitting: Family Medicine

## 2024-01-13 DIAGNOSIS — N9489 Other specified conditions associated with female genital organs and menstrual cycle: Secondary | ICD-10-CM

## 2024-01-13 DIAGNOSIS — N838 Other noninflammatory disorders of ovary, fallopian tube and broad ligament: Secondary | ICD-10-CM | POA: Diagnosis not present

## 2024-01-13 DIAGNOSIS — Z90710 Acquired absence of both cervix and uterus: Secondary | ICD-10-CM | POA: Diagnosis not present

## 2024-01-19 DIAGNOSIS — Z1212 Encounter for screening for malignant neoplasm of rectum: Secondary | ICD-10-CM | POA: Diagnosis not present

## 2024-01-19 DIAGNOSIS — E785 Hyperlipidemia, unspecified: Secondary | ICD-10-CM | POA: Diagnosis not present

## 2024-01-19 DIAGNOSIS — I10 Essential (primary) hypertension: Secondary | ICD-10-CM | POA: Diagnosis not present

## 2024-01-24 NOTE — Progress Notes (Signed)
 [facility] - Clinic Note  02/01/2024   CHIEF COMPLAINT Patient presents for Retina Follow Up  HISTORY OF PRESENT ILLNESS: Linda [name] is a 83 y.o. female who presents to the clinic today for:  HPI     Retina Follow Up   Patient presents with  CRVO/BRVO.  In right eye.  Severity is moderate.  Duration of 4 weeks.  Since onset it is stable.  I, the attending physician,  performed the HPI with the patient and updated documentation appropriately.        Comments   4 week Retina eval. Patient states vision is doing good. Vision was checked with the J card today.      Last edited by Rennis Chris, MD on 02/01/2024 12:28 PM.      Pt  Referring physician: Rodrigo Ran, MD [address] [place],  [place] [address]  HISTORICAL INFORMATION:  Selected notes from the MEDICAL RECORD NUMBER Referred by Dr. Charise Killian for BRVO OD LEE: 11.12.24, BCVA OD: CF@1 ', OS: 20/40-2 Ocular Hx- BRVO OD, Dry Eye Syndrome, pseudophakia OU PMH- HTN   CURRENT MEDICATIONS: No current outpatient medications on file. (Ophthalmic Drugs)   No current facility-administered medications for this visit. (Ophthalmic Drugs)   Current Outpatient Medications (Other)  Medication Sig   acetaminophen (TYLENOL) 325 MG tablet Take 650 mg by mouth every 6 (six) hours as needed.   ALPRAZolam (XANAX) 0.5 MG tablet Take 0.5 mg by mouth daily.   amLODipine (NORVASC) 5 MG tablet Take 1 tablet (5 mg total) by mouth daily.   benazepril (LOTENSIN) 40 MG tablet Take 40 mg by mouth daily.   calcium carbonate (TUMS - DOSED IN MG ELEMENTAL CALCIUM) 500 MG chewable tablet Chew 1 tablet by mouth daily.   Calcium-Vitamin D-Vitamin K (VIACTIV CALCIUM PLUS D PO) Take 2 tablets by mouth 2 (two) times daily.   carbidopa-levodopa (SINEMET IR) 25-100 MG tablet Take 2 tablets by mouth 3 (three) times daily.   desvenlafaxine (PRISTIQ) 100 MG 24 hr tablet Take 100 mg by mouth daily.    fosfomycin (MONUROL) 3 g PACK  Take 3 g by mouth once.   lamoTRIgine (LAMICTAL) 150 MG tablet Take 300 mg by mouth daily.   loratadine (CLARITIN) 10 MG tablet Take 10 mg by mouth daily.   Multiple Vitamins-Minerals (ONE-A-DAY WOMENS 50+ ADVANTAGE) TABS Take 1 tablet by mouth daily.   omeprazole (PRILOSEC) 40 MG capsule TAKE ONE CAPSULE (40MG  TOTAL) BY MOUTH DAILY   ondansetron (ZOFRAN) 8 MG tablet Take 1 tablet (8 mg total) by mouth every 8 (eight) hours as needed for nausea.   Pimavanserin Tartrate (NUPLAZID) 34 MG CAPS Take 1 capsule (34 mg total) by mouth daily.   polyethylene glycol (MIRALAX) 17 g packet MiraLax   propranolol (INDERAL) 10 MG tablet Take 10 mg by mouth 2 (two) times daily.   rosuvastatin (CRESTOR) 20 MG tablet Take 1 tablet (20 mg total) by mouth daily.   spironolactone (ALDACTONE) 25 MG tablet TAKE ONE TABLET (25MG  TOTAL) BY MOUTH DAILY   triamcinolone cream (KENALOG) 0.1 % apply to the itching areas TID prn   No current facility-administered medications for this visit. (Other)   REVIEW OF SYSTEMS: ROS   Positive for: Neurological, Musculoskeletal, Eyes Negative for: Cardiovascular Last edited by Lana Fish, COT on 02/01/2024  7:45 AM.       ALLERGIES Allergies  Allergen Reactions   Amoxicillin Hives   Cefdinir Other (See Comments)   Other Other (See Comments)  Penicillin G Other (See Comments)   Abilify [Aripiprazole] Other (See Comments)    Makes pt tremble   PAST MEDICAL HISTORY Past Medical History:  Diagnosis Date   Anxiety    Candida esophagitis (HCC)    Chronic kidney disease    DDD (degenerative disc disease)    Depression    Erosive gastritis    Falls    GERD (gastroesophageal reflux disease)    Hiatal hernia    High cholesterol    History of anal fissures    Hypertension    Obesity    Osteoarthritis    Panic disorder    PONV (postoperative nausea and vomiting)    Sleep apnea    Does not need CPAP anymore   Past Surgical History:  Procedure Laterality Date    ABDOMINAL HYSTERECTOMY  1987   LSO / menorrhagia & leiomyomata   APPENDECTOMY     BREAST EXCISIONAL BIOPSY Left 1989   benign   BREAST EXCISIONAL BIOPSY Left 07/11/2020   benign   BREAST SURGERY  1989   benign tumor left breast   CHOLECYSTECTOMY  2001   TUBAL LIGATION     FAMILY HISTORY Family History  Problem Relation Age of Onset   Heart disease Mother    Lung cancer Father    Diabetes Sister    Hypertension Sister    Ovarian cancer Sister    Diabetes Brother    Heart disease Brother    Heart attack Brother    Heart disease Brother    Heart attack Brother    Heart disease Brother    Heart attack Brother    Colon cancer Neg Hx    BRCA 1/2 Neg Hx    Breast cancer Neg Hx    SOCIAL HISTORY Social History   Tobacco Use   Smoking status: Never    Passive exposure: Never   Smokeless tobacco: Never  Vaping Use   Vaping status: Never Used  Substance Use Topics   Alcohol use: No   Drug use: No       OPHTHALMIC EXAM:  Base Eye Exam     Visual Acuity (Snellen - Linear)       Right Left   Dist cc 20/200 20/40-2   Dist ph cc 20/70 20/30    Correction: Glasses         Tonometry (Tonopen, 7:54 AM)       Right Left   Pressure 13 12         Pupils       Dark Light Shape React APD   Right 3 2 Round Brisk None   Left 3 2 Round Brisk None         Visual Fields (Counting fingers)       Left Right    Full          Extraocular Movement       Right Left    Full, Ortho Full, Ortho         Neuro/Psych     Oriented x3: Yes   Mood/Affect: Normal         Dilation     Both eyes: 1.0% Mydriacyl, 2.5% Phenylephrine @ 7:54 AM           Slit Lamp and Fundus Exam     Slit Lamp Exam       Right Left   Lids/Lashes Dermatochalasis - upper lid, mild MGD Dermatochalasis - upper lid, mild MGD   Conjunctiva/Sclera White and quiet White  and quiet   Cornea well healed cataract wound, tear film debris well healed cataract wound, tear film  debris   Anterior Chamber deep and clear deep and clear   Iris Round and dilated Round and dilated   Lens PC IOL in good position PC IOL in good position   Anterior Vitreous  Posterior vitreous detachment         Fundus Exam       Right Left   Disc Pink and Sharp, mild tilt, temporal PPA mild Pallor, mild tilt, Compact, PPA   C/D Ratio 0.2 0.2   Macula Blunted foveal reflex, persistent central edema - slightly improved, confluent IRH / flame hemes superior macula --improving Flat, Blunted foveal reflex, RPE mottling, No heme or edema   Vessels attenuated, Tortuous, ST BRVO -- severe attenuation of ST vessels attenuated, Tortuous   Periphery Attached, +DBH ST quadrant -- improving, peripheral drusen, mild reticular degeneration Attached, mild reticular degeneration, No heme           Refraction     Wearing Rx       Sphere Cylinder Axis Add   Right -0.50 +0.25 085 +2.50   Left -1.50 +0.75 055 +2.50           IMAGING AND PROCEDURES  Imaging and Procedures for 02/01/2024  OCT, Retina - OU - Both Eyes       Right Eye Quality was good. Central Foveal Thickness: 335. Progression has improved. Findings include no SRF, abnormal foveal contour, intraretinal hyper-reflective material, epiretinal membrane, intraretinal fluid (Mild interval improvement in central edema with superior extension, partial PVD).   Left Eye Quality was good. Central Foveal Thickness: 263. Progression has been stable. Findings include normal foveal contour, no IRF, no SRF.   Notes *Images captured and stored on drive  Diagnosis / Impression:  OD: BRVO - mild interval improvement in central edema with superior extension, partial PVD OS: NFP, no IRF/SRF  Clinical management:  See below  Abbreviations: NFP - Normal foveal profile. CME - cystoid macular edema. PED - pigment epithelial detachment. IRF - intraretinal fluid. SRF - subretinal fluid. EZ - ellipsoid zone. ERM - epiretinal membrane. ORA -  outer retinal atrophy. ORT - outer retinal tubulation. SRHM - subretinal hyper-reflective material. IRHM - intraretinal hyper-reflective material      Intravitreal Injection, Pharmacologic Agent - OD - Right Eye       Time Out 02/01/2024. 8:33 AM. Confirmed correct patient, procedure, site, and patient consented.   Anesthesia Topical anesthesia was used. Anesthetic medications included Lidocaine 2%, Proparacaine 0.5%.   Procedure Preparation included 5% betadine to ocular surface, eyelid speculum. A (32g) needle was used.   Injection: 2 mg aflibercept 2 MG/0.05ML   Route: Intravitreal, Site: Right Eye   NDC: Q956576, Lot: [id number], Expiration date: 02/15/2025, Waste: 0 mL   Post-op Post injection exam found visual acuity of at least counting fingers. The patient tolerated the procedure well. There were no complications. The patient received written and verbal post procedure care education. Post injection medications were not given.            ASSESSMENT/PLAN:   ICD-10-CM   1. Branch retinal vein occlusion of right eye with macular edema  H34.8310 OCT, Retina - OU - Both Eyes    Intravitreal Injection, Pharmacologic Agent - OD - Right Eye    aflibercept (EYLEA) SOLN 2 mg    2. Essential hypertension  I10     3. Hypertensive retinopathy of both eyes  H35.033  4. Pseudophakia, both eyes  Z96.1      1. BRVO w/ CME OD  - s/p IVA OD #1 (11.21.24), #2 (12.19.24), #3 (01.16.25) - IVA resistance  ==========================  - s/p IVE OD #1 (02.13.25), #2 (03.18.25) - BCVA OD 20/70 from 20/150 - exam shows IRH superior macula w/ extension of DBH to superior periphery -- improving - OCT shows OD: mild interval improvement in central edema with superior extension, partial PVD - recommend IVE OD #3 today, 04.15.25 w/ f/u in 4 wks - pt wishes to proceed with injection -- pt covering 20% coinsurance for medication - RBA of procedure discussed, questions answered - IVE  informed consent obtained and signed, 02.13.25 (OD) - see procedure note - F/U 4 weeks -- DFE/OCT/possible injection  2,3. Hypertensive retinopathy OU - discussed importance of tight BP control - monitor  4. Pseudophakia OU  - s/p CE/IOL OU (March 2023; Dr. Adora Hoover)  - IOL in good position, doing well  - monitor  Ophthalmic Meds Ordered this visit:  Meds ordered this encounter  Medications   aflibercept (EYLEA) SOLN 2 mg    Return in about 4 weeks (around 02/29/2024) for f/u BRVO OD, DFE, OCT, Possible Injxn.  There are no Patient Instructions on file for this visit.  This document serves as a record of services personally performed by Jeanice Millard, MD, PhD. It was created on their behalf by Olene Berne, COT an ophthalmic technician. The creation of this record is the provider's dictation and/or activities during the visit.    Electronically signed by:  Olene Berne, COT  02/01/24 12:29 PM  This document serves as a record of services personally performed by Jeanice Millard, MD, PhD. It was created on their behalf by Morley Arabia. Bevin Bucks, OA an ophthalmic technician. The creation of this record is the provider's dictation and/or activities during the visit.    Electronically signed by: Morley Arabia. Bevin Bucks, OA 02/01/24 12:29 PM   Jeanice Millard, M.D., Ph.D. Diseases & Surgery of the Retina and Vitreous [facility]  I have reviewed the above documentation for accuracy and completeness, and I agree with the above. Jeanice Millard, M.D., Ph.D. 02/01/24 12:30 PM   Abbreviations: M myopia (nearsighted); A astigmatism; H hyperopia (farsighted); P presbyopia; Mrx spectacle prescription;  CTL contact lenses; OD right eye; OS left eye; OU both eyes  XT exotropia; ET esotropia; PEK punctate epithelial keratitis; PEE punctate epithelial erosions; DES dry eye syndrome; MGD meibomian gland dysfunction; ATs artificial tears; PFAT's preservative free artificial  tears; NSC nuclear sclerotic cataract; PSC posterior subcapsular cataract; ERM epi-retinal membrane; PVD posterior vitreous detachment; RD retinal detachment; DM diabetes mellitus; DR diabetic retinopathy; NPDR non-proliferative diabetic retinopathy; PDR proliferative diabetic retinopathy; CSME clinically significant macular edema; DME diabetic macular edema; dbh dot blot hemorrhages; CWS cotton wool spot; POAG primary open angle glaucoma; C/D cup-to-disc ratio; HVF humphrey visual field; GVF goldmann visual field; OCT optical coherence tomography; IOP intraocular pressure; BRVO Branch retinal vein occlusion; CRVO central retinal vein occlusion; CRAO central retinal artery occlusion; BRAO branch retinal artery occlusion; RT retinal tear; SB scleral buckle; PPV pars plana vitrectomy; VH Vitreous hemorrhage; PRP panretinal laser photocoagulation; IVK intravitreal kenalog; VMT vitreomacular traction; MH Macular hole;  NVD neovascularization of the disc; NVE neovascularization elsewhere; AREDS age related eye disease study; ARMD age related macular degeneration; POAG primary open angle glaucoma; EBMD epithelial/anterior basement membrane dystrophy; ACIOL anterior chamber intraocular lens; IOL intraocular lens; PCIOL posterior chamber  intraocular lens; Phaco/IOL phacoemulsification with intraocular lens placement; PRK photorefractive keratectomy; LASIK laser assisted in situ keratomileusis; HTN hypertension; DM diabetes mellitus; COPD chronic obstructive pulmonary disease

## 2024-01-25 DIAGNOSIS — I129 Hypertensive chronic kidney disease with stage 1 through stage 4 chronic kidney disease, or unspecified chronic kidney disease: Secondary | ICD-10-CM | POA: Diagnosis not present

## 2024-01-25 DIAGNOSIS — F322 Major depressive disorder, single episode, severe without psychotic features: Secondary | ICD-10-CM | POA: Diagnosis not present

## 2024-01-25 DIAGNOSIS — Z1339 Encounter for screening examination for other mental health and behavioral disorders: Secondary | ICD-10-CM | POA: Diagnosis not present

## 2024-01-25 DIAGNOSIS — N9489 Other specified conditions associated with female genital organs and menstrual cycle: Secondary | ICD-10-CM | POA: Diagnosis not present

## 2024-01-25 DIAGNOSIS — R82998 Other abnormal findings in urine: Secondary | ICD-10-CM | POA: Diagnosis not present

## 2024-01-25 DIAGNOSIS — J309 Allergic rhinitis, unspecified: Secondary | ICD-10-CM | POA: Diagnosis not present

## 2024-01-25 DIAGNOSIS — Z0000 Encounter for general adult medical examination without abnormal findings: Secondary | ICD-10-CM | POA: Diagnosis not present

## 2024-01-25 DIAGNOSIS — R32 Unspecified urinary incontinence: Secondary | ICD-10-CM | POA: Diagnosis not present

## 2024-01-25 DIAGNOSIS — G20A1 Parkinson's disease without dyskinesia, without mention of fluctuations: Secondary | ICD-10-CM | POA: Diagnosis not present

## 2024-01-25 DIAGNOSIS — E785 Hyperlipidemia, unspecified: Secondary | ICD-10-CM | POA: Diagnosis not present

## 2024-01-25 DIAGNOSIS — N1832 Chronic kidney disease, stage 3b: Secondary | ICD-10-CM | POA: Diagnosis not present

## 2024-01-25 DIAGNOSIS — Z1331 Encounter for screening for depression: Secondary | ICD-10-CM | POA: Diagnosis not present

## 2024-01-25 DIAGNOSIS — F02B3 Dementia in other diseases classified elsewhere, moderate, with mood disturbance: Secondary | ICD-10-CM | POA: Diagnosis not present

## 2024-01-25 DIAGNOSIS — I10 Essential (primary) hypertension: Secondary | ICD-10-CM | POA: Diagnosis not present

## 2024-01-27 ENCOUNTER — Encounter: Payer: Self-pay | Admitting: Obstetrics & Gynecology

## 2024-01-28 ENCOUNTER — Encounter: Admitting: Obstetrics & Gynecology

## 2024-01-28 ENCOUNTER — Other Ambulatory Visit (HOSPITAL_COMMUNITY): Payer: Self-pay | Admitting: Internal Medicine

## 2024-01-28 DIAGNOSIS — N9489 Other specified conditions associated with female genital organs and menstrual cycle: Secondary | ICD-10-CM

## 2024-01-29 ENCOUNTER — Ambulatory Visit (HOSPITAL_COMMUNITY)
Admission: RE | Admit: 2024-01-29 | Discharge: 2024-01-29 | Disposition: A | Discharge status: Home / Self Care | Source: Ambulatory Visit | Attending: Internal Medicine | Admitting: Internal Medicine

## 2024-01-29 DIAGNOSIS — Z90710 Acquired absence of both cervix and uterus: Secondary | ICD-10-CM | POA: Diagnosis not present

## 2024-01-29 DIAGNOSIS — M47816 Spondylosis without myelopathy or radiculopathy, lumbar region: Secondary | ICD-10-CM | POA: Diagnosis not present

## 2024-01-29 DIAGNOSIS — N9489 Other specified conditions associated with female genital organs and menstrual cycle: Secondary | ICD-10-CM | POA: Insufficient documentation

## 2024-01-29 DIAGNOSIS — R188 Other ascites: Secondary | ICD-10-CM | POA: Diagnosis not present

## 2024-01-29 DIAGNOSIS — K5730 Diverticulosis of large intestine without perforation or abscess without bleeding: Secondary | ICD-10-CM | POA: Diagnosis not present

## 2024-01-29 MED ORDER — GADOBUTROL 1 MMOL/ML IV SOLN
7.0000 mL | Freq: Once | INTRAVENOUS | Status: AC | PRN
  Administered 2024-01-29: 7 mL via INTRAVENOUS

## 2024-01-31 ENCOUNTER — Encounter: Payer: Self-pay | Admitting: Diagnostic Neuroimaging

## 2024-02-01 ENCOUNTER — Encounter (INDEPENDENT_AMBULATORY_CARE_PROVIDER_SITE_OTHER): Payer: Self-pay | Admitting: Ophthalmology

## 2024-02-01 ENCOUNTER — Ambulatory Visit (INDEPENDENT_AMBULATORY_CARE_PROVIDER_SITE_OTHER): Admitting: Ophthalmology

## 2024-02-01 DIAGNOSIS — I10 Essential (primary) hypertension: Secondary | ICD-10-CM

## 2024-02-01 DIAGNOSIS — Z961 Presence of intraocular lens: Secondary | ICD-10-CM | POA: Diagnosis not present

## 2024-02-01 DIAGNOSIS — H34831 Tributary (branch) retinal vein occlusion, right eye, with macular edema: Secondary | ICD-10-CM

## 2024-02-01 DIAGNOSIS — H35033 Hypertensive retinopathy, bilateral: Secondary | ICD-10-CM

## 2024-02-01 MED ORDER — AFLIBERCEPT 2MG/0.05ML IZ SOLN FOR KALEIDOSCOPE
2.0000 mg | INTRAVITREAL | Status: AC | PRN
  Administered 2024-02-01: 2 mg via INTRAVITREAL

## 2024-02-02 DIAGNOSIS — I129 Hypertensive chronic kidney disease with stage 1 through stage 4 chronic kidney disease, or unspecified chronic kidney disease: Secondary | ICD-10-CM | POA: Diagnosis not present

## 2024-02-02 DIAGNOSIS — E785 Hyperlipidemia, unspecified: Secondary | ICD-10-CM | POA: Diagnosis not present

## 2024-02-02 DIAGNOSIS — N1832 Chronic kidney disease, stage 3b: Secondary | ICD-10-CM | POA: Diagnosis not present

## 2024-02-02 DIAGNOSIS — D649 Anemia, unspecified: Secondary | ICD-10-CM | POA: Diagnosis not present

## 2024-02-02 DIAGNOSIS — K219 Gastro-esophageal reflux disease without esophagitis: Secondary | ICD-10-CM | POA: Diagnosis not present

## 2024-02-08 DIAGNOSIS — K219 Gastro-esophageal reflux disease without esophagitis: Secondary | ICD-10-CM | POA: Diagnosis not present

## 2024-02-08 DIAGNOSIS — F3341 Major depressive disorder, recurrent, in partial remission: Secondary | ICD-10-CM | POA: Diagnosis not present

## 2024-02-08 DIAGNOSIS — E669 Obesity, unspecified: Secondary | ICD-10-CM | POA: Diagnosis not present

## 2024-02-08 DIAGNOSIS — F419 Anxiety disorder, unspecified: Secondary | ICD-10-CM | POA: Diagnosis not present

## 2024-02-08 DIAGNOSIS — I10 Essential (primary) hypertension: Secondary | ICD-10-CM | POA: Diagnosis not present

## 2024-02-08 DIAGNOSIS — K5900 Constipation, unspecified: Secondary | ICD-10-CM | POA: Diagnosis not present

## 2024-02-08 DIAGNOSIS — E785 Hyperlipidemia, unspecified: Secondary | ICD-10-CM | POA: Diagnosis not present

## 2024-02-08 DIAGNOSIS — G20A1 Parkinson's disease without dyskinesia, without mention of fluctuations: Secondary | ICD-10-CM | POA: Diagnosis not present

## 2024-02-08 DIAGNOSIS — Z9849 Cataract extraction status, unspecified eye: Secondary | ICD-10-CM | POA: Diagnosis not present

## 2024-02-14 ENCOUNTER — Encounter: Admitting: Obstetrics & Gynecology

## 2024-02-16 NOTE — Progress Notes (Signed)
 [facility] - Clinic Note  02/29/2024   CHIEF COMPLAINT Patient presents for Retina Follow Up  HISTORY OF PRESENT ILLNESS: Linda [name] is a 83 y.o. female who presents to the clinic today for:  HPI     Retina Follow Up   Patient presents with  CRVO/BRVO.  In right eye.  This started 6 months ago.  Severity is moderate.  Duration of 4 weeks.  Since onset it is stable.  I, the attending physician,  performed the HPI with the patient and updated documentation appropriately.        Comments   Pt presents for 4 week retina eval., BRVO OD. Pt states no changes in vision. Pt denies FOL/floaters/discomfort. Pt is using Systane 1-3 times per day. Pt is having a difficult morning with her Parkinson's and dementia per husband.      Last edited by Ronelle Coffee, MD on 02/29/2024  9:52 PM.      Referring physician: Aldo Hun, MD [address] [place],  [place] [address]  HISTORICAL INFORMATION:  Selected notes from the MEDICAL RECORD NUMBER Referred by Dr. Barbra Boone for BRVO OD LEE: 11.12.24, BCVA OD: CF@1 ', OS: 20/40-2 Ocular Hx- BRVO OD, Dry Eye Syndrome, pseudophakia OU PMH- HTN   CURRENT MEDICATIONS: No current outpatient medications on file. (Ophthalmic Drugs)   No current facility-administered medications for this visit. (Ophthalmic Drugs)   Current Outpatient Medications (Other)  Medication Sig   acetaminophen  (TYLENOL ) 325 MG tablet Take 650 mg by mouth every 6 (six) hours as needed.   ALPRAZolam (XANAX) 0.5 MG tablet Take 0.5 mg by mouth daily.   amLODipine (NORVASC) 5 MG tablet Take 1 tablet (5 mg total) by mouth daily.   benazepril (LOTENSIN) 40 MG tablet Take 40 mg by mouth daily.   calcium  carbonate (TUMS - DOSED IN MG ELEMENTAL CALCIUM ) 500 MG chewable tablet Chew 1 tablet by mouth daily.   Calcium -Vitamin D-Vitamin K (VIACTIV CALCIUM  PLUS D PO) Take 2 tablets by mouth 2 (two) times daily.   carbidopa -levodopa  (SINEMET  IR) 25-100 MG tablet  Take 2 tablets by mouth 3 (three) times daily.   desvenlafaxine (PRISTIQ) 100 MG 24 hr tablet Take 100 mg by mouth daily.    fosfomycin (MONUROL) 3 g PACK Take 3 g by mouth once.   lamoTRIgine (LAMICTAL) 150 MG tablet Take 300 mg by mouth daily.   loratadine (CLARITIN) 10 MG tablet Take 10 mg by mouth daily.   Multiple Vitamins-Minerals (ONE-A-DAY WOMENS 50+ ADVANTAGE) TABS Take 1 tablet by mouth daily.   omeprazole  (PRILOSEC) 40 MG capsule TAKE ONE CAPSULE (40MG  TOTAL) BY MOUTH DAILY   ondansetron  (ZOFRAN ) 8 MG tablet Take 1 tablet (8 mg total) by mouth every 8 (eight) hours as needed for nausea.   Pimavanserin Tartrate  (NUPLAZID ) 34 MG CAPS Take 1 capsule (34 mg total) by mouth daily.   polyethylene glycol (MIRALAX) 17 g packet MiraLax   propranolol (INDERAL) 10 MG tablet Take 10 mg by mouth 2 (two) times daily.   rosuvastatin  (CRESTOR ) 20 MG tablet Take 1 tablet (20 mg total) by mouth daily.   spironolactone  (ALDACTONE ) 25 MG tablet TAKE ONE TABLET (25MG  TOTAL) BY MOUTH DAILY   triamcinolone cream (KENALOG) 0.1 % apply to the itching areas TID prn   No current facility-administered medications for this visit. (Other)   REVIEW OF SYSTEMS: ROS   Positive for: Neurological, Musculoskeletal, Eyes Negative for: Cardiovascular Last edited by Carrington Clack, COT on 02/29/2024  8:03 AM.  ALLERGIES Allergies  Allergen Reactions   Amoxicillin Hives   Cefdinir Other (See Comments)   Other Other (See Comments)   Penicillin G Other (See Comments)   Abilify [Aripiprazole] Other (See Comments)    Makes pt tremble   PAST MEDICAL HISTORY Past Medical History:  Diagnosis Date   Anxiety    Candida esophagitis (HCC)    Chronic kidney disease    DDD (degenerative disc disease)    Depression    Erosive gastritis    Falls    GERD (gastroesophageal reflux disease)    Hiatal hernia    High cholesterol    History of anal fissures    Hypertension    Obesity    Osteoarthritis     Panic disorder    PONV (postoperative nausea and vomiting)    Sleep apnea    Does not need CPAP anymore   Past Surgical History:  Procedure Laterality Date   ABDOMINAL HYSTERECTOMY  1987   LSO / menorrhagia & leiomyomata   APPENDECTOMY     BREAST EXCISIONAL BIOPSY Left 1989   benign   BREAST EXCISIONAL BIOPSY Left 07/11/2020   benign   BREAST SURGERY  1989   benign tumor left breast   CHOLECYSTECTOMY  2001   TUBAL LIGATION     FAMILY HISTORY Family History  Problem Relation Age of Onset   Heart disease Mother    Lung cancer Father    Diabetes Sister    Hypertension Sister    Ovarian cancer Sister    Diabetes Brother    Heart disease Brother    Heart attack Brother    Heart disease Brother    Heart attack Brother    Heart disease Brother    Heart attack Brother    Colon cancer Neg Hx    BRCA 1/2 Neg Hx    Breast cancer Neg Hx    SOCIAL HISTORY Social History   Tobacco Use   Smoking status: Never    Passive exposure: Never   Smokeless tobacco: Never  Vaping Use   Vaping status: Never Used  Substance Use Topics   Alcohol  use: No   Drug use: No       OPHTHALMIC EXAM:  Base Eye Exam     Visual Acuity (Snellen - Linear)       Right Left   Dist cc 20/80 -2 20/60   Dist ph cc NI 20/40 -1    Correction: Glasses         Tonometry (Tonopen, 8:22 AM)       Right Left   Pressure 19 13         Pupils       Pupils Dark Light Shape React APD   Right PERRL 5 4 Round Sluggish Pulsate   Left PERRL 5 4 Round Sluggish Pulsate         Visual Fields       Left Right    Full Full         Extraocular Movement       Right Left    Full, Ortho Full, Ortho         Neuro/Psych     Oriented x3: Yes   Mood/Affect: Normal         Dilation     Both eyes: 1.0% Mydriacyl , 2.5% Phenylephrine  @ 8:23 AM           Slit Lamp and Fundus Exam     Slit Lamp Exam  Right Left   Lids/Lashes Dermatochalasis - upper lid, mild MGD  Dermatochalasis - upper lid, mild MGD   Conjunctiva/Sclera White and quiet White and quiet   Cornea well healed cataract wound, tear film debris well healed cataract wound, tear film debris   Anterior Chamber deep and clear deep and clear   Iris Round and dilated Round and dilated   Lens PC IOL in good position PC IOL in good position   Anterior Vitreous  Posterior vitreous detachment         Fundus Exam       Right Left   Disc Pink and Sharp, mild tilt, temporal PPA mild Pallor, mild tilt, Compact, PPA   C/D Ratio 0.2 0.2   Macula Blunted foveal reflex, persistent central edema - slightly improved, confluent IRH / flame hemes superior macula -- improving Flat, Blunted foveal reflex, RPE mottling, No heme or edema   Vessels attenuated, Tortuous, ST BRVO -- severe attenuation of ST vessels attenuated, Tortuous   Periphery Attached, +DBH ST quadrant -- improving, peripheral drusen, mild reticular degeneration Attached, mild reticular degeneration, No heme           Refraction     Wearing Rx       Sphere Cylinder Axis Add   Right -0.50 +0.25 085 +2.50   Left -1.50 +0.75 055 +2.50           IMAGING AND PROCEDURES  Imaging and Procedures for 02/29/2024  OCT, Retina - OU - Both Eyes        Right Eye Quality was good. Central Foveal Thickness: 334. Progression has improved. Findings include no SRF, abnormal foveal contour, intraretinal hyper-reflective material, epiretinal membrane, intraretinal fluid (Mild interval improvement in central edema with superior extension, partial PVD).   Left Eye Quality was good. Central Foveal Thickness: 255. Progression has been stable. Findings include normal foveal contour, no IRF, no SRF.   Notes  *Images captured and stored on drive  Diagnosis / Impression:  OD: BRVO - mild interval improvement in central edema with superior extension, partial PVD OS: NFP, no IRF/SRF  Clinical management:  See below  Abbreviations: NFP -  Normal foveal profile. CME - cystoid macular edema. PED - pigment epithelial detachment. IRF - intraretinal fluid. SRF - subretinal fluid. EZ - ellipsoid zone. ERM - epiretinal membrane. ORA - outer retinal atrophy. ORT - outer retinal tubulation. SRHM - subretinal hyper-reflective material. IRHM - intraretinal hyper-reflective material      Intravitreal Injection, Pharmacologic Agent - OD - Right Eye       Time Out 02/29/2024. 9:09 AM. Confirmed correct patient, procedure, site, and patient consented.   Anesthesia Topical anesthesia was used. Anesthetic medications included Lidocaine  2%, Proparacaine 0.5%.   Procedure Preparation included 5% betadine  to ocular surface, eyelid speculum. A (32g) needle was used.   Injection: 2 mg aflibercept  2 MG/0.05ML   Route: Intravitreal, Site: Right Eye   NDC: [id number], Lot: [id number], Expiration date: 04/17/2025, Waste: 0 mL   Post-op Post injection exam found visual acuity of at least counting fingers. The patient tolerated the procedure well. There were no complications. The patient received written and verbal post procedure care education. Post injection medications were not given.           ASSESSMENT/PLAN:   ICD-10-CM   1. Branch retinal vein occlusion of right eye with macular edema  H34.8310 OCT, Retina - OU - Both Eyes    Intravitreal Injection, Pharmacologic Agent - OD - Right Eye  aflibercept  (EYLEA ) SOLN 2 mg    2. Essential hypertension  I10     3. Hypertensive retinopathy of both eyes  H35.033     4. Pseudophakia, both eyes  Z96.1      1. BRVO w/ CME OD  - s/p IVA OD #1 (11.21.24), #2 (12.19.24), #3 (01.16.25) - IVA resistance  ==========================  - s/p IVE OD #1 (02.13.25), #2 (03.18.25), #3 (04.15.25) - BCVA OD 20/70 from 20/150 - exam shows IRH superior macula w/ extension of DBH to superior periphery -- improving - OCT shows OD: mild interval improvement in central edema with superior extension,  partial PVD - recommend IVE OD #4 today, 05.13.25 w/ f/u in 5 wks -- due to MD being out of the office in 4 weeks - pt wishes to proceed with injection -- pt covering 20% coinsurance for medication - RBA of procedure discussed, questions answered - IVE informed consent obtained and signed, 02.13.25 (OD) - see procedure note - F/U 5 weeks -- DFE/OCT/possible injection  2,3. Hypertensive retinopathy OU - discussed importance of tight BP control - monitor  4. Pseudophakia OU  - s/p CE/IOL OU (March 2023; Dr. Adora Hoover)  - IOL in good position, doing well  - monitor  Ophthalmic Meds Ordered this visit:  Meds ordered this encounter  Medications   aflibercept  (EYLEA ) SOLN 2 mg    Return in about 5 weeks (around 04/04/2024) for f/u BRVO OD, DFE, OCT, Possible Injxn.  There are no Patient Instructions on file for this visit.  This document serves as a record of services personally performed by Jeanice Millard, MD, PhD. It was created on their behalf by Olene Berne, COT an ophthalmic technician. The creation of this record is the provider's dictation and/or activities during the visit.    Electronically signed by:  Olene Berne, COT  02/29/24 9:54 PM  This document serves as a record of services personally performed by Jeanice Millard, MD, PhD. It was created on their behalf by Morley Arabia. Bevin Bucks, OA an ophthalmic technician. The creation of this record is the provider's dictation and/or activities during the visit.    Electronically signed by: Morley Arabia. Bevin Bucks, OA 02/29/24 9:54 PM  Jeanice Millard, M.D., Ph.D. Diseases & Surgery of the Retina and Vitreous [facility]  I have reviewed the above documentation for accuracy and completeness, and I agree with the above. Jeanice Millard, M.D., Ph.D. 02/29/24 9:54 PM    Abbreviations: M myopia (nearsighted); A astigmatism; H hyperopia (farsighted); P presbyopia; Mrx spectacle prescription;  CTL contact  lenses; OD right eye; OS left eye; OU both eyes  XT exotropia; ET esotropia; PEK punctate epithelial keratitis; PEE punctate epithelial erosions; DES dry eye syndrome; MGD meibomian gland dysfunction; ATs artificial tears; PFAT's preservative free artificial tears; NSC nuclear sclerotic cataract; PSC posterior subcapsular cataract; ERM epi-retinal membrane; PVD posterior vitreous detachment; RD retinal detachment; DM diabetes mellitus; DR diabetic retinopathy; NPDR non-proliferative diabetic retinopathy; PDR proliferative diabetic retinopathy; CSME clinically significant macular edema; DME diabetic macular edema; dbh dot blot hemorrhages; CWS cotton wool spot; POAG primary open angle glaucoma; C/D cup-to-disc ratio; HVF humphrey visual field; GVF goldmann visual field; OCT optical coherence tomography; IOP intraocular pressure; BRVO Branch retinal vein occlusion; CRVO central retinal vein occlusion; CRAO central retinal artery occlusion; BRAO branch retinal artery occlusion; RT retinal tear; SB scleral buckle; PPV pars plana vitrectomy; VH Vitreous hemorrhage; PRP panretinal laser photocoagulation; IVK intravitreal kenalog; VMT vitreomacular traction; MH  Macular hole;  NVD neovascularization of the disc; NVE neovascularization elsewhere; AREDS age related eye disease study; ARMD age related macular degeneration; POAG primary open angle glaucoma; EBMD epithelial/anterior basement membrane dystrophy; ACIOL anterior chamber intraocular lens; IOL intraocular lens; PCIOL posterior chamber intraocular lens; Phaco/IOL phacoemulsification with intraocular lens placement; PRK photorefractive keratectomy; LASIK laser assisted in situ keratomileusis; HTN hypertension; DM diabetes mellitus; COPD chronic obstructive pulmonary disease

## 2024-02-17 DIAGNOSIS — R1900 Intra-abdominal and pelvic swelling, mass and lump, unspecified site: Secondary | ICD-10-CM | POA: Diagnosis not present

## 2024-02-17 DIAGNOSIS — N9489 Other specified conditions associated with female genital organs and menstrual cycle: Secondary | ICD-10-CM | POA: Diagnosis not present

## 2024-02-25 ENCOUNTER — Telehealth: Payer: Self-pay | Admitting: Diagnostic Neuroimaging

## 2024-02-25 NOTE — Telephone Encounter (Signed)
 Spouse is wanting to discuss with Dr Salli Crawley having pt taken off of Pimavanserin Tartrate  (NUPLAZID ) 34 MG CAPS , please call to discuss.

## 2024-02-25 NOTE — Telephone Encounter (Signed)
 Spoke w/Pt spouse regarding the medication. Spouse states Pt is still talking about her brother and sister wanting to know if they are still there because she thought siblings were there earlier. Also tells spouse their son is coming by to get her when he is not. Spouse states he doesn't think the Nuplazid  is working for Pt. When asked about specific visual or audible hallucinations, spouse denied that is happening. Spouse also reports Pt was found to have a cyst in her stomach that was causing her to have the upset stomach. She was referred to an oncologist with [facility] and will be having surgery June 3rd to remove the cyst. Spouse is asking if patient can go back on the carbidopa /levodopa  now that they know the source of her stomach upset. Informed spouse a message will be sent to provider regarding all the concerns. Pt stated understanding and thankful for the call back.

## 2024-02-25 NOTE — Telephone Encounter (Signed)
 Call back to husband and clarified that can continue Nuplazid , symptoms may get worse if stop medication. He states he will continue at this time and keep us  posted.

## 2024-02-25 NOTE — Telephone Encounter (Signed)
 Call to spouse, reviewed Dr. Salli Crawley recommendations. Husband verbalized understanding. He asked about Nuplazid  and I relayed no mention of that medication was given so continue as prescribed unless told differently.

## 2024-02-29 ENCOUNTER — Ambulatory Visit (INDEPENDENT_AMBULATORY_CARE_PROVIDER_SITE_OTHER): Admitting: Ophthalmology

## 2024-02-29 ENCOUNTER — Encounter (INDEPENDENT_AMBULATORY_CARE_PROVIDER_SITE_OTHER): Payer: Self-pay | Admitting: Ophthalmology

## 2024-02-29 DIAGNOSIS — E785 Hyperlipidemia, unspecified: Secondary | ICD-10-CM | POA: Diagnosis not present

## 2024-02-29 DIAGNOSIS — M8580 Other specified disorders of bone density and structure, unspecified site: Secondary | ICD-10-CM | POA: Diagnosis not present

## 2024-02-29 DIAGNOSIS — H35033 Hypertensive retinopathy, bilateral: Secondary | ICD-10-CM

## 2024-02-29 DIAGNOSIS — H34831 Tributary (branch) retinal vein occlusion, right eye, with macular edema: Secondary | ICD-10-CM | POA: Diagnosis not present

## 2024-02-29 DIAGNOSIS — I10 Essential (primary) hypertension: Secondary | ICD-10-CM | POA: Diagnosis not present

## 2024-02-29 DIAGNOSIS — M519 Unspecified thoracic, thoracolumbar and lumbosacral intervertebral disc disorder: Secondary | ICD-10-CM | POA: Diagnosis not present

## 2024-02-29 DIAGNOSIS — Z961 Presence of intraocular lens: Secondary | ICD-10-CM

## 2024-02-29 DIAGNOSIS — G20A1 Parkinson's disease without dyskinesia, without mention of fluctuations: Secondary | ICD-10-CM | POA: Diagnosis not present

## 2024-02-29 DIAGNOSIS — I739 Peripheral vascular disease, unspecified: Secondary | ICD-10-CM | POA: Diagnosis not present

## 2024-02-29 DIAGNOSIS — G309 Alzheimer's disease, unspecified: Secondary | ICD-10-CM | POA: Diagnosis not present

## 2024-02-29 DIAGNOSIS — F02B4 Dementia in other diseases classified elsewhere, moderate, with anxiety: Secondary | ICD-10-CM | POA: Diagnosis not present

## 2024-02-29 DIAGNOSIS — Z9049 Acquired absence of other specified parts of digestive tract: Secondary | ICD-10-CM | POA: Diagnosis not present

## 2024-02-29 DIAGNOSIS — F322 Major depressive disorder, single episode, severe without psychotic features: Secondary | ICD-10-CM | POA: Diagnosis not present

## 2024-02-29 DIAGNOSIS — F02B18 Dementia in other diseases classified elsewhere, moderate, with other behavioral disturbance: Secondary | ICD-10-CM | POA: Diagnosis not present

## 2024-02-29 DIAGNOSIS — F02B3 Dementia in other diseases classified elsewhere, moderate, with mood disturbance: Secondary | ICD-10-CM | POA: Diagnosis not present

## 2024-02-29 MED ORDER — AFLIBERCEPT 2MG/0.05ML IZ SOLN FOR KALEIDOSCOPE
2.0000 mg | INTRAVITREAL | Status: AC | PRN
  Administered 2024-02-29: 2 mg via INTRAVITREAL

## 2024-03-02 DIAGNOSIS — D72829 Elevated white blood cell count, unspecified: Secondary | ICD-10-CM | POA: Diagnosis not present

## 2024-03-02 DIAGNOSIS — Z0000 Encounter for general adult medical examination without abnormal findings: Secondary | ICD-10-CM | POA: Diagnosis not present

## 2024-03-02 DIAGNOSIS — D649 Anemia, unspecified: Secondary | ICD-10-CM | POA: Diagnosis not present

## 2024-03-02 DIAGNOSIS — G20A1 Parkinson's disease without dyskinesia, without mention of fluctuations: Secondary | ICD-10-CM | POA: Diagnosis not present

## 2024-03-02 DIAGNOSIS — I1310 Hypertensive heart and chronic kidney disease without heart failure, with stage 1 through stage 4 chronic kidney disease, or unspecified chronic kidney disease: Secondary | ICD-10-CM | POA: Diagnosis not present

## 2024-03-02 DIAGNOSIS — I959 Hypotension, unspecified: Secondary | ICD-10-CM | POA: Diagnosis not present

## 2024-03-02 DIAGNOSIS — F322 Major depressive disorder, single episode, severe without psychotic features: Secondary | ICD-10-CM | POA: Diagnosis not present

## 2024-03-02 DIAGNOSIS — G309 Alzheimer's disease, unspecified: Secondary | ICD-10-CM | POA: Diagnosis not present

## 2024-03-02 DIAGNOSIS — N9489 Other specified conditions associated with female genital organs and menstrual cycle: Secondary | ICD-10-CM | POA: Diagnosis not present

## 2024-03-02 DIAGNOSIS — F02B3 Dementia in other diseases classified elsewhere, moderate, with mood disturbance: Secondary | ICD-10-CM | POA: Diagnosis not present

## 2024-03-02 DIAGNOSIS — I129 Hypertensive chronic kidney disease with stage 1 through stage 4 chronic kidney disease, or unspecified chronic kidney disease: Secondary | ICD-10-CM | POA: Diagnosis not present

## 2024-03-02 DIAGNOSIS — R296 Repeated falls: Secondary | ICD-10-CM | POA: Diagnosis not present

## 2024-03-02 DIAGNOSIS — I10 Essential (primary) hypertension: Secondary | ICD-10-CM | POA: Diagnosis not present

## 2024-03-02 DIAGNOSIS — N1832 Chronic kidney disease, stage 3b: Secondary | ICD-10-CM | POA: Diagnosis not present

## 2024-03-03 ENCOUNTER — Other Ambulatory Visit: Payer: Self-pay | Admitting: Neurology

## 2024-03-06 DIAGNOSIS — F02B3 Dementia in other diseases classified elsewhere, moderate, with mood disturbance: Secondary | ICD-10-CM | POA: Diagnosis not present

## 2024-03-06 DIAGNOSIS — N898 Other specified noninflammatory disorders of vagina: Secondary | ICD-10-CM | POA: Diagnosis not present

## 2024-03-06 DIAGNOSIS — D72829 Elevated white blood cell count, unspecified: Secondary | ICD-10-CM | POA: Diagnosis not present

## 2024-03-06 DIAGNOSIS — N9489 Other specified conditions associated with female genital organs and menstrual cycle: Secondary | ICD-10-CM | POA: Diagnosis not present

## 2024-03-06 DIAGNOSIS — G309 Alzheimer's disease, unspecified: Secondary | ICD-10-CM | POA: Diagnosis not present

## 2024-03-06 DIAGNOSIS — R630 Anorexia: Secondary | ICD-10-CM | POA: Diagnosis not present

## 2024-03-07 DIAGNOSIS — F02A0 Dementia in other diseases classified elsewhere, mild, without behavioral disturbance, psychotic disturbance, mood disturbance, and anxiety: Secondary | ICD-10-CM | POA: Diagnosis not present

## 2024-03-07 DIAGNOSIS — K449 Diaphragmatic hernia without obstruction or gangrene: Secondary | ICD-10-CM | POA: Diagnosis not present

## 2024-03-07 DIAGNOSIS — E785 Hyperlipidemia, unspecified: Secondary | ICD-10-CM | POA: Diagnosis not present

## 2024-03-07 DIAGNOSIS — Z87898 Personal history of other specified conditions: Secondary | ICD-10-CM | POA: Diagnosis not present

## 2024-03-07 DIAGNOSIS — N179 Acute kidney failure, unspecified: Secondary | ICD-10-CM | POA: Diagnosis not present

## 2024-03-07 DIAGNOSIS — R600 Localized edema: Secondary | ICD-10-CM | POA: Diagnosis not present

## 2024-03-07 DIAGNOSIS — G20A1 Parkinson's disease without dyskinesia, without mention of fluctuations: Secondary | ICD-10-CM | POA: Diagnosis not present

## 2024-03-07 DIAGNOSIS — G3183 Dementia with Lewy bodies: Secondary | ICD-10-CM | POA: Diagnosis not present

## 2024-03-07 DIAGNOSIS — R296 Repeated falls: Secondary | ICD-10-CM | POA: Diagnosis not present

## 2024-03-07 DIAGNOSIS — D72829 Elevated white blood cell count, unspecified: Secondary | ICD-10-CM | POA: Diagnosis not present

## 2024-03-07 DIAGNOSIS — K219 Gastro-esophageal reflux disease without esophagitis: Secondary | ICD-10-CM | POA: Diagnosis not present

## 2024-03-07 DIAGNOSIS — F419 Anxiety disorder, unspecified: Secondary | ICD-10-CM | POA: Diagnosis not present

## 2024-03-07 DIAGNOSIS — Z01818 Encounter for other preprocedural examination: Secondary | ICD-10-CM | POA: Diagnosis not present

## 2024-03-07 DIAGNOSIS — H34831 Tributary (branch) retinal vein occlusion, right eye, with macular edema: Secondary | ICD-10-CM | POA: Diagnosis not present

## 2024-03-07 DIAGNOSIS — F329 Major depressive disorder, single episode, unspecified: Secondary | ICD-10-CM | POA: Diagnosis not present

## 2024-03-07 DIAGNOSIS — I10 Essential (primary) hypertension: Secondary | ICD-10-CM | POA: Diagnosis not present

## 2024-03-07 DIAGNOSIS — G4733 Obstructive sleep apnea (adult) (pediatric): Secondary | ICD-10-CM | POA: Diagnosis not present

## 2024-03-07 DIAGNOSIS — R1900 Intra-abdominal and pelvic swelling, mass and lump, unspecified site: Secondary | ICD-10-CM | POA: Diagnosis not present

## 2024-03-09 DIAGNOSIS — N898 Other specified noninflammatory disorders of vagina: Secondary | ICD-10-CM | POA: Diagnosis not present

## 2024-03-09 DIAGNOSIS — G20A1 Parkinson's disease without dyskinesia, without mention of fluctuations: Secondary | ICD-10-CM | POA: Diagnosis not present

## 2024-03-09 DIAGNOSIS — G309 Alzheimer's disease, unspecified: Secondary | ICD-10-CM | POA: Diagnosis not present

## 2024-03-09 DIAGNOSIS — I1310 Hypertensive heart and chronic kidney disease without heart failure, with stage 1 through stage 4 chronic kidney disease, or unspecified chronic kidney disease: Secondary | ICD-10-CM | POA: Diagnosis not present

## 2024-03-09 DIAGNOSIS — N1832 Chronic kidney disease, stage 3b: Secondary | ICD-10-CM | POA: Diagnosis not present

## 2024-03-09 DIAGNOSIS — F02B3 Dementia in other diseases classified elsewhere, moderate, with mood disturbance: Secondary | ICD-10-CM | POA: Diagnosis not present

## 2024-03-09 DIAGNOSIS — R630 Anorexia: Secondary | ICD-10-CM | POA: Diagnosis not present

## 2024-03-09 DIAGNOSIS — D72829 Elevated white blood cell count, unspecified: Secondary | ICD-10-CM | POA: Diagnosis not present

## 2024-03-09 DIAGNOSIS — I959 Hypotension, unspecified: Secondary | ICD-10-CM | POA: Diagnosis not present

## 2024-03-09 DIAGNOSIS — N9489 Other specified conditions associated with female genital organs and menstrual cycle: Secondary | ICD-10-CM | POA: Diagnosis not present

## 2024-03-09 DIAGNOSIS — D649 Anemia, unspecified: Secondary | ICD-10-CM | POA: Diagnosis not present

## 2024-03-14 DIAGNOSIS — R1900 Intra-abdominal and pelvic swelling, mass and lump, unspecified site: Secondary | ICD-10-CM | POA: Diagnosis not present

## 2024-03-15 NOTE — Progress Notes (Signed)
 [facility] [address], [place] [address]   Clinic Day:  03/16/2024  Referring physician: Aldo Hun, MD  Patient Care Team: Aldo Hun, MD as PCP - General (Internal Medicine) Euell Herrlich, MD as PCP - Cardiology (Cardiology)   ASSESSMENT & PLAN:   Assessment:  1.  Normocytic anemia: - Patient seen at the request of Dr. Genelle Kennedy - CBC (03/02/2024): Hb-9.5, MCV-95, PLT-489. - CBC (03/06/2024): Hb-10.7, PLT-526, WBC-10.8.  Ferritin was 833. - CBC (02/01/2021): Hb-11.3, MCV-96, PLT-394. - She has Parkinson's with dementia.  Most of the history provided by her husband.  Denies BRBPR/melena.  No prior transfusion history.  She is not on iron supplements.  She has recurrent UTIs. - She also has CKD.  2. Social/Family History: -Lives at home with her husband and has a sedentary lifestyle. Patient has Parkinson's and dementia. Has frequent falls due to Parkinson's. Dependent of most ADL's and IADL's. She is able to feed herself. No tobacco use. -No known family history of dementia or Parkinson's.  No known family history of cancer.   Plan:  1.  Normocytic anemia: - Most likely from CKD and functional iron deficiency and inflammation from recurrent infections. - Will check her CBC today and do workup for nutritional deficiencies and bone marrow infiltrative process and hemolysis. - She will come back and see me in 2 weeks for follow-up. - She does report that she has ovarian cyst surgery scheduled around 03/21/2024.   Orders Placed This Encounter  Procedures   CBC with Differential    Standing Status:   Future    Number of Occurrences:   1    Expected Date:   03/16/2024    Expiration Date:   03/16/2025   Lactate dehydrogenase    Standing Status:   Future    Number of Occurrences:   1    Expected Date:   03/16/2024    Expiration Date:   03/16/2025   Reticulocytes    Standing Status:   Future    Number of Occurrences:   1    Expected Date:   03/16/2024     Expiration Date:   03/16/2025   Iron and TIBC ([facility] ONLY)    Standing Status:   Future    Number of Occurrences:   1    Expected Date:   03/16/2024    Expiration Date:   03/16/2025   Ferritin    Standing Status:   Future    Number of Occurrences:   1    Expected Date:   03/16/2024    Expiration Date:   03/16/2025   Vitamin B12    Standing Status:   Future    Number of Occurrences:   1    Expected Date:   03/16/2024    Expiration Date:   03/16/2025   Folate    Standing Status:   Future    Number of Occurrences:   1    Expected Date:   03/16/2024    Expiration Date:   03/16/2025   Methylmalonic acid, serum    Standing Status:   Future    Number of Occurrences:   1    Expected Date:   03/16/2024    Expiration Date:   03/16/2025   Kappa/lambda light chains    Standing Status:   Future    Number of Occurrences:   1    Expected Date:   03/16/2024    Expiration Date:   03/16/2025  Immunofixation electrophoresis    Standing Status:   Future    Number of Occurrences:   1    Expected Date:   03/16/2024    Expiration Date:   03/16/2025   Protein electrophoresis, serum    Standing Status:   Future    Number of Occurrences:   1    Expected Date:   03/16/2024    Expiration Date:   03/16/2025   Copper, serum    Standing Status:   Future    Number of Occurrences:   1    Expected Date:   03/16/2024    Expiration Date:   03/16/2025   Direct antiglobulin test    Standing Status:   Future    Number of Occurrences:   1    Expected Date:   03/16/2024    Expiration Date:   03/16/2025      Hurman Maiden R Teague,acting as a scribe for Paulett Boros, MD.,have documented all relevant documentation on the behalf of Paulett Boros, MD,as directed by  Paulett Boros, MD while in the presence of Paulett Boros, MD.   I, Paulett Boros MD, have reviewed the above documentation for accuracy and completeness, and I agree with the above.   Paulett Boros,  MD   5/29/20254:45 PM  CHIEF COMPLAINT/PURPOSE OF CONSULT:   Diagnosis: Normocytic anemia  Current Therapy: Under workup  HISTORY OF PRESENT ILLNESS:   Itxel is a 83 y.o. female presenting to clinic today for evaluation of anemia at the request of Aldo Hun, MD.  Patient has a medical history of CKD stage 3b, hypotension, dementia, Parkinson's, leukocytosis, and OSA.   Tykeria was most recently seen by her PCP on 03/06/24. Her husband called Dr. Tempie Fee office on 03/03/24 and stated Ranae is getting progressively weaker and did not know if she should undergo an ovarian cyst removal scheduled for 03/21/24 with Dr. Monroe Antigua of [facility]. Her anemia has been progressively worsening since April 2025, despite normal B 12 and iron levels, and referral to me was placed to discuss etiology.   Her last CBC diff from 03/06/24 was abnormal with low RBC at 3.3, low HGB at 10.7, low HCT at 31.5, elevated MCH at 32.1, elevated platelets at 526, low MPV at 6.1, and elevated neutrophils at 8.1.   Iron and TIBC from 03/02/24 showed low iron saturation at 12 and normal iron at 31. Ferritin from 03/02/24 was elevated at 833. Vitamin B 12 from 03/02/24 was normal at 1102.   Today, she states that she is doing well overall. Her appetite level is at 50%. Her energy level is at 50%. Robecca is accompanied by her husband.   She has never taken iron supplements and recently discontinued a multivitamin due to cyst removal surgery on 03/21/24.   She denies any BRBPR or melena. Dazha has no history of blood transfusions, MI's, CVA's, or TIA's. She has recurrent UTI's, though her most recent was around a month ago and required antibiotics.   Glorya has had 2 falls in the last 3 months with no injuries due to Parkinson's making it difficult to ambulate.   PAST MEDICAL HISTORY:   Past Medical History: Past Medical History:  Diagnosis Date   Anxiety    Candida esophagitis (HCC)    Chronic kidney disease     DDD (degenerative disc disease)    Depression    Erosive gastritis    Falls    GERD (gastroesophageal reflux disease)    Hiatal hernia    High  cholesterol    History of anal fissures    Hypertension    Obesity    Osteoarthritis    Panic disorder    PONV (postoperative nausea and vomiting)    Sleep apnea    Does not need CPAP anymore    Surgical History: Past Surgical History:  Procedure Laterality Date   ABDOMINAL HYSTERECTOMY  1987   LSO / menorrhagia & leiomyomata   APPENDECTOMY     BREAST EXCISIONAL BIOPSY Left 1989   benign   BREAST EXCISIONAL BIOPSY Left 07/11/2020   benign   BREAST SURGERY  1989   benign tumor left breast   CHOLECYSTECTOMY  2001   TUBAL LIGATION      Social History: Social History   Socioeconomic History   Marital status: Married    Spouse name: Not on file   Number of children: 1   Years of education: Not on file   Highest education level: Not on file  Occupational History   Occupation: Retired   Tobacco Use   Smoking status: Never    Passive exposure: Never   Smokeless tobacco: Never  Vaping Use   Vaping status: Never Used  Substance and Sexual Activity   Alcohol  use: No   Drug use: No   Sexual activity: Yes  Other Topics Concern   Not on file  Social History Narrative   Daily caffeine    Social Drivers of Health   Financial Resource Strain: Not on file  Food Insecurity: No Food Insecurity (03/16/2024)   Hunger Vital Sign    Worried About Running Out of Food in the Last Year: Never true    Ran Out of Food in the Last Year: Never true  Transportation Needs: No Transportation Needs (03/16/2024)   PRAPARE - Administrator, Civil Service (Medical): No    Lack of Transportation (Non-Medical): No  Physical Activity: Not on file  Stress: Not on file  Social Connections: Not on file  Intimate Partner Violence: Not At Risk (03/16/2024)   Humiliation, Afraid, Rape, and Kick questionnaire    Fear of Current or  Ex-Partner: No    Emotionally Abused: No    Physically Abused: No    Sexually Abused: No    Family History: Family History  Problem Relation Age of Onset   Heart disease Mother    Lung cancer Father    Diabetes Sister    Hypertension Sister    Ovarian cancer Sister    Diabetes Brother    Heart disease Brother    Heart attack Brother    Heart disease Brother    Heart attack Brother    Heart disease Brother    Heart attack Brother    Colon cancer Neg Hx    BRCA 1/2 Neg Hx    Breast cancer Neg Hx     Current Medications:  Current Outpatient Medications:    acetaminophen  (TYLENOL ) 325 MG tablet, Take 650 mg by mouth every 6 (six) hours as needed., Disp: , Rfl:    ALPRAZolam (XANAX) 0.5 MG tablet, Take 0.5 mg by mouth daily., Disp: , Rfl:    benazepril (LOTENSIN) 40 MG tablet, Take 40 mg by mouth daily., Disp: , Rfl:    calcium  carbonate (TUMS - DOSED IN MG ELEMENTAL CALCIUM ) 500 MG chewable tablet, Chew 1 tablet by mouth daily., Disp: , Rfl:    Calcium -Vitamin D-Vitamin K (VIACTIV CALCIUM  PLUS D PO), Take 2 tablets by mouth 2 (two) times daily., Disp: , Rfl:  desvenlafaxine (PRISTIQ) 100 MG 24 hr tablet, Take 100 mg by mouth daily. , Disp: , Rfl:    fosfomycin (MONUROL) 3 g PACK, Take 3 g by mouth once., Disp: , Rfl:    lamoTRIgine (LAMICTAL) 100 MG tablet, Take 100 mg by mouth., Disp: , Rfl:    loratadine (CLARITIN) 10 MG tablet, Take 10 mg by mouth daily., Disp: , Rfl:    Multiple Vitamins-Minerals (ONE-A-DAY WOMENS 50+ ADVANTAGE) TABS, Take 1 tablet by mouth daily., Disp: , Rfl:    nystatin (MYCOSTATIN/NYSTOP) powder, Apply 1 Application topically., Disp: , Rfl:    omeprazole  (PRILOSEC) 40 MG capsule, TAKE ONE CAPSULE (40MG  TOTAL) BY MOUTH DAILY, Disp: 90 capsule, Rfl: 3   ondansetron  (ZOFRAN ) 8 MG tablet, Take 1 tablet (8 mg total) by mouth every 8 (eight) hours as needed for nausea., Disp: 20 tablet, Rfl: 6   polyethylene glycol (MIRALAX) 17 g packet, MiraLax, Disp: ,  Rfl:    propranolol (INDERAL) 10 MG tablet, Take 10 mg by mouth 2 (two) times daily., Disp: , Rfl:    rosuvastatin  (CRESTOR ) 20 MG tablet, Take 1 tablet (20 mg total) by mouth daily., Disp: 90 tablet, Rfl: 3   spironolactone  (ALDACTONE ) 25 MG tablet, TAKE ONE TABLET (25MG  TOTAL) BY MOUTH DAILY, Disp: 90 tablet, Rfl: 3   triamcinolone cream (KENALOG) 0.1 %, apply to the itching areas TID prn, Disp: , Rfl:    Allergies: Allergies  Allergen Reactions   Amoxicillin Hives   Cefdinir Other (See Comments)   Other Other (See Comments)   Penicillin G Other (See Comments)   Abilify [Aripiprazole] Other (See Comments)    Makes pt tremble    REVIEW OF SYSTEMS:   Review of Systems  Constitutional:  Positive for fatigue. Negative for chills and fever.  HENT:   Negative for lump/mass, mouth sores, nosebleeds, sore throat and trouble swallowing.   Eyes:  Negative for eye problems.  Respiratory:  Negative for cough and shortness of breath.   Cardiovascular:  Negative for chest pain, leg swelling and palpitations.  Gastrointestinal:  Negative for abdominal pain, constipation, diarrhea, nausea and vomiting.  Genitourinary:  Positive for difficulty urinating. Negative for bladder incontinence, dysuria, frequency, hematuria and nocturia.   Musculoskeletal:  Negative for arthralgias, back pain, flank pain, myalgias and neck pain.  Skin:  Negative for itching and rash.  Neurological:  Negative for dizziness, headaches and numbness.  Hematological:  Does not bruise/bleed easily.  Psychiatric/Behavioral:  Positive for depression. Negative for sleep disturbance and suicidal ideas. The patient is nervous/anxious.   All other systems reviewed and are negative.    VITALS:   Blood pressure (!) 137/54, pulse 62, temperature (!) 97.5 F (36.4 C), temperature source Oral, resp. rate 18, weight 138 lb 4.8 oz (62.7 kg), SpO2 94%.  Wt Readings from Last 3 Encounters:  03/16/24 138 lb 4.8 oz (62.7 kg)  12/30/23  152 lb (68.9 kg)  12/20/23 152 lb (68.9 kg)    Body mass index is 24.5 kg/m.   PHYSICAL EXAM:   Physical Exam Vitals and nursing note reviewed. Exam conducted with a chaperone present.  Constitutional:      Appearance: Normal appearance.  Cardiovascular:     Rate and Rhythm: Normal rate and regular rhythm.     Pulses: Normal pulses.     Heart sounds: Normal heart sounds.  Pulmonary:     Effort: Pulmonary effort is normal.     Breath sounds: Normal breath sounds.  Abdominal:     Palpations:  Abdomen is soft. There is no hepatomegaly, splenomegaly or mass.     Tenderness: There is no abdominal tenderness.  Musculoskeletal:     Right lower leg: No edema.     Left lower leg: No edema.  Lymphadenopathy:     Cervical: No cervical adenopathy.     Right cervical: No superficial, deep or posterior cervical adenopathy.    Left cervical: No superficial, deep or posterior cervical adenopathy.     Upper Body:     Right upper body: No supraclavicular or axillary adenopathy.     Left upper body: No supraclavicular or axillary adenopathy.  Neurological:     General: No focal deficit present.     Mental Status: She is alert and oriented to person, place, and time.  Psychiatric:        Mood and Affect: Mood normal.        Behavior: Behavior normal.     LABS:   CBC    Component Value Date/Time   WBC 8.0 03/16/2024 0913   RBC 3.57 (L) 03/16/2024 0913   RBC 3.56 (L) 03/16/2024 0913   HGB 11.2 (L) 03/16/2024 0913   HCT 35.6 (L) 03/16/2024 0913   PLT 336 03/16/2024 0913   MCV 99.7 03/16/2024 0913   MCH 31.4 03/16/2024 0913   MCHC 31.5 03/16/2024 0913   RDW 13.3 03/16/2024 0913   LYMPHSABS 1.7 03/16/2024 0913   MONOABS 0.4 03/16/2024 0913   EOSABS 0.2 03/16/2024 0913   BASOSABS 0.1 03/16/2024 0913    CMP    Component Value Date/Time   NA 130 (L) 12/30/2023 0649   NA 141 10/30/2019 1221   K 4.2 12/30/2023 0649   CL 92 (L) 12/30/2023 0649   CO2 29 12/30/2023 0649   GLUCOSE  109 (H) 12/30/2023 0649   BUN 22 12/30/2023 0649   BUN 16 10/30/2019 1221   CREATININE 1.24 (H) 12/30/2023 0649   CALCIUM  9.4 12/30/2023 0649   PROT 6.7 12/30/2023 0649   PROT 6.8 04/28/2022 0814   ALBUMIN 4.0 12/30/2023 0649   ALBUMIN 4.7 04/28/2022 0814   AST 27 12/30/2023 0649   ALT 27 12/30/2023 0649   ALKPHOS 56 12/30/2023 0649   BILITOT 0.8 12/30/2023 0649   BILITOT 0.4 04/28/2022 0814   GFRNONAA 44 (L) 12/30/2023 0649   GFRAA 61 10/30/2019 1221    No results found for: "CEA1", "CEA" / No results found for: "CEA1", "CEA" No results found for: "PSA1" No results found for: "HQI696" No results found for: "CAN125"  No results found for: "TOTALPROTELP", "ALBUMINELP", "A1GS", "A2GS", "BETS", "BETA2SER", "GAMS", "MSPIKE", "SPEI" Lab Results  Component Value Date   TIBC 319 03/16/2024   FERRITIN 405 (H) 03/16/2024   IRONPCTSAT 20 03/16/2024   Lab Results  Component Value Date   LDH 183 03/16/2024     STUDIES:   Intravitreal Injection, Pharmacologic Agent - OD - Right Eye Result Date: 02/29/2024 Time Out 02/29/2024. 9:09 AM. Confirmed correct patient, procedure, site, and patient consented. Anesthesia Topical anesthesia was used. Anesthetic medications included Lidocaine  2%, Proparacaine 0.5%. Procedure Preparation included 5% betadine  to ocular surface, eyelid speculum. A (32g) needle was used. Injection: 2 mg aflibercept  2 MG/0.05ML   Route: Intravitreal, Site: Right Eye   NDC: [id number], Lot: [id number], Expiration date: 04/17/2025, Waste: 0 mL Post-op Post injection exam found visual acuity of at least counting fingers. The patient tolerated the procedure well. There were no complications. The patient received written and verbal post procedure care education. Post injection medications were  not given.   OCT, Retina - OU - Both Eyes Result Date: 02/29/2024 Right Eye Quality was good. Central Foveal Thickness: 334. Progression has improved. Findings include no SRF, abnormal  foveal contour, intraretinal hyper-reflective material, epiretinal membrane, intraretinal fluid (Mild interval improvement in central edema with superior extension, partial PVD). Left Eye Quality was good. Central Foveal Thickness: 255. Progression has been stable. Findings include normal foveal contour, no IRF, no SRF. Notes *Images captured and stored on drive Diagnosis / Impression: OD: BRVO - mild interval improvement in central edema with superior extension, partial PVD OS: NFP, no IRF/SRF Clinical management: See below Abbreviations: NFP - Normal foveal profile. CME - cystoid macular edema. PED - pigment epithelial detachment. IRF - intraretinal fluid. SRF - subretinal fluid. EZ - ellipsoid zone. ERM - epiretinal membrane. ORA - outer retinal atrophy. ORT - outer retinal tubulation. SRHM - subretinal hyper-reflective material. IRHM - intraretinal hyper-reflective material

## 2024-03-16 ENCOUNTER — Inpatient Hospital Stay: Attending: Hematology | Admitting: Hematology

## 2024-03-16 ENCOUNTER — Inpatient Hospital Stay: Admitting: Hematology

## 2024-03-16 ENCOUNTER — Other Ambulatory Visit: Payer: Self-pay

## 2024-03-16 VITALS — BP 137/54 | HR 62 | Temp 97.5°F | Resp 18 | Wt 138.3 lb

## 2024-03-16 DIAGNOSIS — N1832 Chronic kidney disease, stage 3b: Secondary | ICD-10-CM

## 2024-03-16 DIAGNOSIS — Z8041 Family history of malignant neoplasm of ovary: Secondary | ICD-10-CM | POA: Diagnosis not present

## 2024-03-16 DIAGNOSIS — I129 Hypertensive chronic kidney disease with stage 1 through stage 4 chronic kidney disease, or unspecified chronic kidney disease: Secondary | ICD-10-CM | POA: Diagnosis not present

## 2024-03-16 DIAGNOSIS — G20A1 Parkinson's disease without dyskinesia, without mention of fluctuations: Secondary | ICD-10-CM | POA: Diagnosis not present

## 2024-03-16 DIAGNOSIS — K449 Diaphragmatic hernia without obstruction or gangrene: Secondary | ICD-10-CM | POA: Insufficient documentation

## 2024-03-16 DIAGNOSIS — K219 Gastro-esophageal reflux disease without esophagitis: Secondary | ICD-10-CM | POA: Insufficient documentation

## 2024-03-16 DIAGNOSIS — M1990 Unspecified osteoarthritis, unspecified site: Secondary | ICD-10-CM | POA: Diagnosis not present

## 2024-03-16 DIAGNOSIS — F0283 Dementia in other diseases classified elsewhere, unspecified severity, with mood disturbance: Secondary | ICD-10-CM | POA: Diagnosis not present

## 2024-03-16 DIAGNOSIS — Z801 Family history of malignant neoplasm of trachea, bronchus and lung: Secondary | ICD-10-CM | POA: Insufficient documentation

## 2024-03-16 DIAGNOSIS — D649 Anemia, unspecified: Secondary | ICD-10-CM

## 2024-03-16 DIAGNOSIS — E7800 Pure hypercholesterolemia, unspecified: Secondary | ICD-10-CM | POA: Diagnosis not present

## 2024-03-16 DIAGNOSIS — Z79899 Other long term (current) drug therapy: Secondary | ICD-10-CM | POA: Diagnosis not present

## 2024-03-16 DIAGNOSIS — Z87440 Personal history of urinary (tract) infections: Secondary | ICD-10-CM | POA: Insufficient documentation

## 2024-03-16 DIAGNOSIS — N183 Chronic kidney disease, stage 3 unspecified: Secondary | ICD-10-CM | POA: Insufficient documentation

## 2024-03-16 LAB — RETICULOCYTES
Immature Retic Fract: 17.7 % — ABNORMAL HIGH (ref 2.3–15.9)
RBC.: 3.56 MIL/uL — ABNORMAL LOW (ref 3.87–5.11)
Retic Count, Absolute: 216.1 10*3/uL — ABNORMAL HIGH (ref 19.0–186.0)
Retic Ct Pct: 6.1 % — ABNORMAL HIGH (ref 0.4–3.1)

## 2024-03-16 LAB — IRON AND TIBC
Iron: 65 ug/dL (ref 28–170)
Saturation Ratios: 20 % (ref 10.4–31.8)
TIBC: 319 ug/dL (ref 250–450)
UIBC: 254 ug/dL

## 2024-03-16 LAB — CBC WITH DIFFERENTIAL/PLATELET
Abs Immature Granulocytes: 0.03 10*3/uL (ref 0.00–0.07)
Basophils Absolute: 0.1 10*3/uL (ref 0.0–0.1)
Basophils Relative: 1 %
Eosinophils Absolute: 0.2 10*3/uL (ref 0.0–0.5)
Eosinophils Relative: 2 %
HCT: 35.6 % — ABNORMAL LOW (ref 36.0–46.0)
Hemoglobin: 11.2 g/dL — ABNORMAL LOW (ref 12.0–15.0)
Immature Granulocytes: 0 %
Lymphocytes Relative: 21 %
Lymphs Abs: 1.7 10*3/uL (ref 0.7–4.0)
MCH: 31.4 pg (ref 26.0–34.0)
MCHC: 31.5 g/dL (ref 30.0–36.0)
MCV: 99.7 fL (ref 80.0–100.0)
Monocytes Absolute: 0.4 10*3/uL (ref 0.1–1.0)
Monocytes Relative: 6 %
Neutro Abs: 5.6 10*3/uL (ref 1.7–7.7)
Neutrophils Relative %: 70 %
Platelets: 336 10*3/uL (ref 150–400)
RBC: 3.57 MIL/uL — ABNORMAL LOW (ref 3.87–5.11)
RDW: 13.3 % (ref 11.5–15.5)
WBC: 8 10*3/uL (ref 4.0–10.5)
nRBC: 0 % (ref 0.0–0.2)

## 2024-03-16 LAB — VITAMIN B12: Vitamin B-12: 783 pg/mL (ref 180–914)

## 2024-03-16 LAB — FOLATE: Folate: 28.3 ng/mL (ref >5.9)

## 2024-03-16 LAB — DIRECT ANTIGLOBULIN TEST (NOT AT ARMC)
DAT, IgG: NEGATIVE
DAT, complement: NEGATIVE

## 2024-03-16 LAB — LACTATE DEHYDROGENASE: LDH: 183 U/L (ref 98–192)

## 2024-03-16 LAB — FERRITIN: Ferritin: 405 ng/mL — ABNORMAL HIGH (ref 11–307)

## 2024-03-16 NOTE — Patient Instructions (Signed)
 You were seen and examined today by Dr. Cheree Cords. Dr. Cheree Cords is a hematologist, meaning that he specializes in blood abnormalities. Dr. Cheree Cords discussed your past medical history, family history of cancers/blood conditions and the events that led to you being here today.  You were referred to Dr. Cheree Cords due to elevated ferritin.  Dr. Katragadda has recommended additional labs today for further evaluation.  Follow-up as scheduled.

## 2024-03-17 LAB — KAPPA/LAMBDA LIGHT CHAINS
Kappa free light chain: 32.9 mg/L — ABNORMAL HIGH (ref 3.3–19.4)
Kappa, lambda light chain ratio: 1.73 — ABNORMAL HIGH (ref 0.26–1.65)
Lambda free light chains: 19 mg/L (ref 5.7–26.3)

## 2024-03-17 LAB — COPPER, SERUM: Copper: 141 ug/dL (ref 80–158)

## 2024-03-19 LAB — IMMUNOFIXATION ELECTROPHORESIS
IgA: 251 mg/dL (ref 64–422)
IgG (Immunoglobin G), Serum: 812 mg/dL (ref 586–1602)
IgM (Immunoglobulin M), Srm: 112 mg/dL (ref 26–217)
Total Protein ELP: 6.5 g/dL (ref 6.0–8.5)

## 2024-03-19 LAB — METHYLMALONIC ACID, SERUM: Methylmalonic Acid, Quantitative: 261 nmol/L (ref 0–378)

## 2024-03-20 ENCOUNTER — Encounter: Payer: Self-pay | Admitting: Internal Medicine

## 2024-03-20 DIAGNOSIS — R488 Other symbolic dysfunctions: Secondary | ICD-10-CM | POA: Diagnosis not present

## 2024-03-20 DIAGNOSIS — E785 Hyperlipidemia, unspecified: Secondary | ICD-10-CM | POA: Diagnosis not present

## 2024-03-20 DIAGNOSIS — G4733 Obstructive sleep apnea (adult) (pediatric): Secondary | ICD-10-CM | POA: Diagnosis not present

## 2024-03-20 DIAGNOSIS — R010 Benign and innocent cardiac murmurs: Secondary | ICD-10-CM | POA: Diagnosis not present

## 2024-03-20 DIAGNOSIS — G3183 Dementia with Lewy bodies: Secondary | ICD-10-CM | POA: Diagnosis not present

## 2024-03-20 DIAGNOSIS — D649 Anemia, unspecified: Secondary | ICD-10-CM | POA: Diagnosis not present

## 2024-03-20 DIAGNOSIS — E860 Dehydration: Secondary | ICD-10-CM | POA: Diagnosis not present

## 2024-03-20 DIAGNOSIS — J309 Allergic rhinitis, unspecified: Secondary | ICD-10-CM | POA: Diagnosis not present

## 2024-03-20 DIAGNOSIS — N1832 Chronic kidney disease, stage 3b: Secondary | ICD-10-CM | POA: Diagnosis not present

## 2024-03-20 DIAGNOSIS — R2689 Other abnormalities of gait and mobility: Secondary | ICD-10-CM | POA: Diagnosis not present

## 2024-03-20 DIAGNOSIS — N838 Other noninflammatory disorders of ovary, fallopian tube and broad ligament: Secondary | ICD-10-CM | POA: Diagnosis not present

## 2024-03-20 DIAGNOSIS — I4510 Unspecified right bundle-branch block: Secondary | ICD-10-CM | POA: Diagnosis not present

## 2024-03-20 DIAGNOSIS — E669 Obesity, unspecified: Secondary | ICD-10-CM | POA: Diagnosis not present

## 2024-03-20 DIAGNOSIS — I10 Essential (primary) hypertension: Secondary | ICD-10-CM | POA: Diagnosis not present

## 2024-03-20 DIAGNOSIS — E441 Mild protein-calorie malnutrition: Secondary | ICD-10-CM | POA: Diagnosis not present

## 2024-03-20 DIAGNOSIS — N7092 Oophoritis, unspecified: Secondary | ICD-10-CM | POA: Diagnosis not present

## 2024-03-20 DIAGNOSIS — M6281 Muscle weakness (generalized): Secondary | ICD-10-CM | POA: Diagnosis not present

## 2024-03-20 DIAGNOSIS — K660 Peritoneal adhesions (postprocedural) (postinfection): Secondary | ICD-10-CM | POA: Diagnosis not present

## 2024-03-20 DIAGNOSIS — Z79899 Other long term (current) drug therapy: Secondary | ICD-10-CM | POA: Diagnosis not present

## 2024-03-20 DIAGNOSIS — D631 Anemia in chronic kidney disease: Secondary | ICD-10-CM | POA: Diagnosis not present

## 2024-03-20 DIAGNOSIS — Z7409 Other reduced mobility: Secondary | ICD-10-CM | POA: Diagnosis not present

## 2024-03-20 DIAGNOSIS — R41841 Cognitive communication deficit: Secondary | ICD-10-CM | POA: Diagnosis not present

## 2024-03-20 DIAGNOSIS — Z880 Allergy status to penicillin: Secondary | ICD-10-CM | POA: Diagnosis not present

## 2024-03-20 DIAGNOSIS — F32A Depression, unspecified: Secondary | ICD-10-CM | POA: Diagnosis not present

## 2024-03-20 DIAGNOSIS — N83201 Unspecified ovarian cyst, right side: Secondary | ICD-10-CM | POA: Diagnosis not present

## 2024-03-20 DIAGNOSIS — R404 Transient alteration of awareness: Secondary | ICD-10-CM | POA: Diagnosis not present

## 2024-03-20 DIAGNOSIS — N739 Female pelvic inflammatory disease, unspecified: Secondary | ICD-10-CM | POA: Diagnosis not present

## 2024-03-20 DIAGNOSIS — R457 State of emotional shock and stress, unspecified: Secondary | ICD-10-CM | POA: Diagnosis not present

## 2024-03-20 DIAGNOSIS — F419 Anxiety disorder, unspecified: Secondary | ICD-10-CM | POA: Diagnosis not present

## 2024-03-20 DIAGNOSIS — I129 Hypertensive chronic kidney disease with stage 1 through stage 4 chronic kidney disease, or unspecified chronic kidney disease: Secondary | ICD-10-CM | POA: Diagnosis not present

## 2024-03-20 DIAGNOSIS — F0283 Dementia in other diseases classified elsewhere, unspecified severity, with mood disturbance: Secondary | ICD-10-CM | POA: Diagnosis not present

## 2024-03-20 DIAGNOSIS — F339 Major depressive disorder, recurrent, unspecified: Secondary | ICD-10-CM | POA: Diagnosis not present

## 2024-03-20 DIAGNOSIS — Z7401 Bed confinement status: Secondary | ICD-10-CM | POA: Diagnosis not present

## 2024-03-20 DIAGNOSIS — G20B2 Parkinson's disease with dyskinesia, with fluctuations: Secondary | ICD-10-CM | POA: Diagnosis not present

## 2024-03-20 DIAGNOSIS — R791 Abnormal coagulation profile: Secondary | ICD-10-CM | POA: Diagnosis not present

## 2024-03-20 DIAGNOSIS — R278 Other lack of coordination: Secondary | ICD-10-CM | POA: Diagnosis not present

## 2024-03-20 DIAGNOSIS — R0602 Shortness of breath: Secondary | ICD-10-CM | POA: Diagnosis not present

## 2024-03-20 DIAGNOSIS — E7800 Pure hypercholesterolemia, unspecified: Secondary | ICD-10-CM | POA: Diagnosis not present

## 2024-03-20 DIAGNOSIS — R1312 Dysphagia, oropharyngeal phase: Secondary | ICD-10-CM | POA: Diagnosis not present

## 2024-03-20 DIAGNOSIS — M5441 Lumbago with sciatica, right side: Secondary | ICD-10-CM | POA: Diagnosis not present

## 2024-03-20 DIAGNOSIS — R1900 Intra-abdominal and pelvic swelling, mass and lump, unspecified site: Secondary | ICD-10-CM | POA: Diagnosis not present

## 2024-03-20 DIAGNOSIS — K219 Gastro-esophageal reflux disease without esophagitis: Secondary | ICD-10-CM | POA: Diagnosis not present

## 2024-03-20 LAB — PROTEIN ELECTROPHORESIS, SERUM
A/G Ratio: 1.1 (ref 0.7–1.7)
Albumin ELP: 3.4 g/dL (ref 2.9–4.4)
Alpha-1-Globulin: 0.4 g/dL (ref 0.0–0.4)
Alpha-2-Globulin: 0.9 g/dL (ref 0.4–1.0)
Beta Globulin: 1.1 g/dL (ref 0.7–1.3)
Gamma Globulin: 0.8 g/dL (ref 0.4–1.8)
Globulin, Total: 3.1 g/dL (ref 2.2–3.9)
Total Protein ELP: 6.5 g/dL (ref 6.0–8.5)

## 2024-03-20 NOTE — Progress Notes (Signed)
 none

## 2024-03-22 DIAGNOSIS — R1900 Intra-abdominal and pelvic swelling, mass and lump, unspecified site: Secondary | ICD-10-CM | POA: Diagnosis not present

## 2024-03-23 DIAGNOSIS — R1900 Intra-abdominal and pelvic swelling, mass and lump, unspecified site: Secondary | ICD-10-CM | POA: Diagnosis not present

## 2024-03-27 ENCOUNTER — Ambulatory Visit: Admitting: Physician Assistant

## 2024-03-28 NOTE — Progress Notes (Shared)
 [facility] - Clinic Note  04/04/2024   CHIEF COMPLAINT Patient presents for No chief complaint on file.  HISTORY OF PRESENT ILLNESS: Linda [name] is a 83 y.o. female who presents to the clinic today for:     Referring physician: Aldo Hun, MD [address] [place],  [place] [address]  HISTORICAL INFORMATION:  Selected notes from the MEDICAL RECORD NUMBER Referred by Dr. Barbra Boone for BRVO OD LEE: 11.12.24, BCVA OD: CF@1 ', OS: 20/40-2 Ocular Hx- BRVO OD, Dry Eye Syndrome, pseudophakia OU PMH- HTN   CURRENT MEDICATIONS: No current outpatient medications on file. (Ophthalmic Drugs)   No current facility-administered medications for this visit. (Ophthalmic Drugs)   Current Outpatient Medications (Other)  Medication Sig   acetaminophen  (TYLENOL ) 325 MG tablet Take 650 mg by mouth every 6 (six) hours as needed.   ALPRAZolam (XANAX) 0.5 MG tablet Take 0.5 mg by mouth daily.   benazepril (LOTENSIN) 40 MG tablet Take 40 mg by mouth daily.   calcium  carbonate (TUMS - DOSED IN MG ELEMENTAL CALCIUM ) 500 MG chewable tablet Chew 1 tablet by mouth daily.   Calcium -Vitamin D-Vitamin K (VIACTIV CALCIUM  PLUS D PO) Take 2 tablets by mouth 2 (two) times daily.   desvenlafaxine (PRISTIQ) 100 MG 24 hr tablet Take 100 mg by mouth daily.    fosfomycin (MONUROL) 3 g PACK Take 3 g by mouth once.   lamoTRIgine (LAMICTAL) 100 MG tablet Take 100 mg by mouth.   loratadine (CLARITIN) 10 MG tablet Take 10 mg by mouth daily.   Multiple Vitamins-Minerals (ONE-A-DAY WOMENS 50+ ADVANTAGE) TABS Take 1 tablet by mouth daily.   nystatin (MYCOSTATIN/NYSTOP) powder Apply 1 Application topically.   omeprazole  (PRILOSEC) 40 MG capsule TAKE ONE CAPSULE (40MG  TOTAL) BY MOUTH DAILY   ondansetron  (ZOFRAN ) 8 MG tablet Take 1 tablet (8 mg total) by mouth every 8 (eight) hours as needed for nausea.   polyethylene glycol (MIRALAX) 17 g packet MiraLax   propranolol (INDERAL) 10 MG tablet Take 10 mg  by mouth 2 (two) times daily.   rosuvastatin  (CRESTOR ) 20 MG tablet Take 1 tablet (20 mg total) by mouth daily.   spironolactone  (ALDACTONE ) 25 MG tablet TAKE ONE TABLET (25MG  TOTAL) BY MOUTH DAILY   triamcinolone cream (KENALOG) 0.1 % apply to the itching areas TID prn   No current facility-administered medications for this visit. (Other)   REVIEW OF SYSTEMS:      ALLERGIES Allergies  Allergen Reactions   Amoxicillin Hives   Cefdinir Other (See Comments)   Other Other (See Comments)   Penicillin G Other (See Comments)   Abilify [Aripiprazole] Other (See Comments)    Makes pt tremble   PAST MEDICAL HISTORY Past Medical History:  Diagnosis Date   Anxiety    Candida esophagitis (HCC)    Chronic kidney disease    DDD (degenerative disc disease)    Depression    Erosive gastritis    Falls    GERD (gastroesophageal reflux disease)    Hiatal hernia    High cholesterol    History of anal fissures    Hypertension    Obesity    Osteoarthritis    Panic disorder    PONV (postoperative nausea and vomiting)    Sleep apnea    Does not need CPAP anymore   Past Surgical History:  Procedure Laterality Date   ABDOMINAL HYSTERECTOMY  1987   LSO / menorrhagia & leiomyomata   APPENDECTOMY     BREAST EXCISIONAL BIOPSY Left  1989   benign   BREAST EXCISIONAL BIOPSY Left 07/11/2020   benign   BREAST SURGERY  1989   benign tumor left breast   CHOLECYSTECTOMY  2001   TUBAL LIGATION     FAMILY HISTORY Family History  Problem Relation Age of Onset   Heart disease Mother    Lung cancer Father    Diabetes Sister    Hypertension Sister    Ovarian cancer Sister    Diabetes Brother    Heart disease Brother    Heart attack Brother    Heart disease Brother    Heart attack Brother    Heart disease Brother    Heart attack Brother    Colon cancer Neg Hx    BRCA 1/2 Neg Hx    Breast cancer Neg Hx    SOCIAL HISTORY Social History   Tobacco Use   Smoking status: Never     Passive exposure: Never   Smokeless tobacco: Never  Vaping Use   Vaping status: Never Used  Substance Use Topics   Alcohol  use: No   Drug use: No       OPHTHALMIC EXAM:  Not recorded    IMAGING AND PROCEDURES  Imaging and Procedures for 04/04/2024         ASSESSMENT/PLAN: No diagnosis found.  1. BRVO w/ CME OD  - s/p IVA OD #1 (11.21.24), #2 (12.19.24), #3 (01.16.25) - IVA resistance  ==========================  - s/p IVE OD #1 (02.13.25), #2 (03.18.25), #3 (04.15.25), #4 (05.13.25) - BCVA OD 20/70 from 20/150 - exam shows IRH superior macula w/ extension of DBH to superior periphery -- improving - OCT shows OD: mild interval improvement in central edema with superior extension, partial PVD - recommend IVE OD #5 today, 06.17.25 w/ f/u in 5 wks -- due to MD being out of the office in 4 weeks - pt wishes to proceed with injection -- pt covering 20% coinsurance for medication - RBA of procedure discussed, questions answered - IVE informed consent obtained and signed, 02.13.25 (OD) - see procedure note - F/U 5 weeks -- DFE/OCT/possible injection  2,3. Hypertensive retinopathy OU - discussed importance of tight BP control - monitor  4. Pseudophakia OU  - s/p CE/IOL OU (March 2023; Dr. Adora Hoover)  - IOL in good position, doing well  - monitor  Ophthalmic Meds Ordered this visit:  No orders of the defined types were placed in this encounter.   No follow-ups on file.  There are no Patient Instructions on file for this visit.  This document serves as a record of services personally performed by Jeanice Millard, MD, PhD. It was created on their behalf by Angelia Kelp, an ophthalmic technician. The creation of this record is the provider's dictation and/or activities during the visit.    Electronically signed by: Angelia Kelp, OA, 03/28/24  1:01 PM   Jeanice Millard, M.D., Ph.D. Diseases & Surgery of the Retina and Vitreous [facility]    Abbreviations: M myopia (nearsighted); A astigmatism; H hyperopia (farsighted); P presbyopia; Mrx spectacle prescription;  CTL contact lenses; OD right eye; OS left eye; OU both eyes  XT exotropia; ET esotropia; PEK punctate epithelial keratitis; PEE punctate epithelial erosions; DES dry eye syndrome; MGD meibomian gland dysfunction; ATs artificial tears; PFAT's preservative free artificial tears; NSC nuclear sclerotic cataract; PSC posterior subcapsular cataract; ERM epi-retinal membrane; PVD posterior vitreous detachment; RD retinal detachment; DM diabetes mellitus; DR diabetic retinopathy; NPDR non-proliferative diabetic retinopathy; PDR proliferative  diabetic retinopathy; CSME clinically significant macular edema; DME diabetic macular edema; dbh dot blot hemorrhages; CWS cotton wool spot; POAG primary open angle glaucoma; C/D cup-to-disc ratio; HVF humphrey visual field; GVF goldmann visual field; OCT optical coherence tomography; IOP intraocular pressure; BRVO Branch retinal vein occlusion; CRVO central retinal vein occlusion; CRAO central retinal artery occlusion; BRAO branch retinal artery occlusion; RT retinal tear; SB scleral buckle; PPV pars plana vitrectomy; VH Vitreous hemorrhage; PRP panretinal laser photocoagulation; IVK intravitreal kenalog; VMT vitreomacular traction; MH Macular hole;  NVD neovascularization of the disc; NVE neovascularization elsewhere; AREDS age related eye disease study; ARMD age related macular degeneration; POAG primary open angle glaucoma; EBMD epithelial/anterior basement membrane dystrophy; ACIOL anterior chamber intraocular lens; IOL intraocular lens; PCIOL posterior chamber intraocular lens; Phaco/IOL phacoemulsification with intraocular lens placement; PRK photorefractive keratectomy; LASIK laser assisted in situ keratomileusis; HTN hypertension; DM diabetes mellitus; COPD chronic obstructive pulmonary disease

## 2024-03-30 ENCOUNTER — Inpatient Hospital Stay: Admitting: Hematology

## 2024-03-31 DIAGNOSIS — I4510 Unspecified right bundle-branch block: Secondary | ICD-10-CM | POA: Diagnosis not present

## 2024-03-31 DIAGNOSIS — M6281 Muscle weakness (generalized): Secondary | ICD-10-CM | POA: Diagnosis not present

## 2024-03-31 DIAGNOSIS — R010 Benign and innocent cardiac murmurs: Secondary | ICD-10-CM | POA: Diagnosis not present

## 2024-03-31 DIAGNOSIS — K529 Noninfective gastroenteritis and colitis, unspecified: Secondary | ICD-10-CM | POA: Diagnosis not present

## 2024-03-31 DIAGNOSIS — M5441 Lumbago with sciatica, right side: Secondary | ICD-10-CM | POA: Diagnosis not present

## 2024-03-31 DIAGNOSIS — E441 Mild protein-calorie malnutrition: Secondary | ICD-10-CM | POA: Diagnosis not present

## 2024-03-31 DIAGNOSIS — E7800 Pure hypercholesterolemia, unspecified: Secondary | ICD-10-CM | POA: Diagnosis not present

## 2024-03-31 DIAGNOSIS — R2689 Other abnormalities of gait and mobility: Secondary | ICD-10-CM | POA: Diagnosis not present

## 2024-03-31 DIAGNOSIS — R488 Other symbolic dysfunctions: Secondary | ICD-10-CM | POA: Diagnosis not present

## 2024-03-31 DIAGNOSIS — R404 Transient alteration of awareness: Secondary | ICD-10-CM | POA: Diagnosis not present

## 2024-03-31 DIAGNOSIS — J309 Allergic rhinitis, unspecified: Secondary | ICD-10-CM | POA: Diagnosis not present

## 2024-03-31 DIAGNOSIS — R1312 Dysphagia, oropharyngeal phase: Secondary | ICD-10-CM | POA: Diagnosis not present

## 2024-03-31 DIAGNOSIS — N1832 Chronic kidney disease, stage 3b: Secondary | ICD-10-CM | POA: Diagnosis not present

## 2024-03-31 DIAGNOSIS — E876 Hypokalemia: Secondary | ICD-10-CM | POA: Diagnosis not present

## 2024-03-31 DIAGNOSIS — R41841 Cognitive communication deficit: Secondary | ICD-10-CM | POA: Diagnosis not present

## 2024-03-31 DIAGNOSIS — R059 Cough, unspecified: Secondary | ICD-10-CM | POA: Diagnosis not present

## 2024-03-31 DIAGNOSIS — E785 Hyperlipidemia, unspecified: Secondary | ICD-10-CM | POA: Diagnosis not present

## 2024-03-31 DIAGNOSIS — F419 Anxiety disorder, unspecified: Secondary | ICD-10-CM | POA: Diagnosis not present

## 2024-03-31 DIAGNOSIS — Z7401 Bed confinement status: Secondary | ICD-10-CM | POA: Diagnosis not present

## 2024-03-31 DIAGNOSIS — R5381 Other malaise: Secondary | ICD-10-CM | POA: Diagnosis not present

## 2024-03-31 DIAGNOSIS — I10 Essential (primary) hypertension: Secondary | ICD-10-CM | POA: Diagnosis not present

## 2024-03-31 DIAGNOSIS — E669 Obesity, unspecified: Secondary | ICD-10-CM | POA: Diagnosis not present

## 2024-03-31 DIAGNOSIS — K219 Gastro-esophageal reflux disease without esophagitis: Secondary | ICD-10-CM | POA: Diagnosis not present

## 2024-03-31 DIAGNOSIS — G20A1 Parkinson's disease without dyskinesia, without mention of fluctuations: Secondary | ICD-10-CM | POA: Diagnosis not present

## 2024-03-31 DIAGNOSIS — F339 Major depressive disorder, recurrent, unspecified: Secondary | ICD-10-CM | POA: Diagnosis not present

## 2024-03-31 DIAGNOSIS — D649 Anemia, unspecified: Secondary | ICD-10-CM | POA: Diagnosis not present

## 2024-03-31 DIAGNOSIS — I129 Hypertensive chronic kidney disease with stage 1 through stage 4 chronic kidney disease, or unspecified chronic kidney disease: Secondary | ICD-10-CM | POA: Diagnosis not present

## 2024-03-31 DIAGNOSIS — G20B2 Parkinson's disease with dyskinesia, with fluctuations: Secondary | ICD-10-CM | POA: Diagnosis not present

## 2024-03-31 DIAGNOSIS — N183 Chronic kidney disease, stage 3 unspecified: Secondary | ICD-10-CM | POA: Diagnosis not present

## 2024-03-31 DIAGNOSIS — Z90710 Acquired absence of both cervix and uterus: Secondary | ICD-10-CM | POA: Diagnosis not present

## 2024-03-31 DIAGNOSIS — Z79899 Other long term (current) drug therapy: Secondary | ICD-10-CM | POA: Diagnosis not present

## 2024-03-31 DIAGNOSIS — R278 Other lack of coordination: Secondary | ICD-10-CM | POA: Diagnosis not present

## 2024-03-31 DIAGNOSIS — R1900 Intra-abdominal and pelvic swelling, mass and lump, unspecified site: Secondary | ICD-10-CM | POA: Diagnosis not present

## 2024-03-31 DIAGNOSIS — R001 Bradycardia, unspecified: Secondary | ICD-10-CM | POA: Diagnosis not present

## 2024-03-31 DIAGNOSIS — I959 Hypotension, unspecified: Secondary | ICD-10-CM | POA: Diagnosis not present

## 2024-03-31 DIAGNOSIS — R457 State of emotional shock and stress, unspecified: Secondary | ICD-10-CM | POA: Diagnosis not present

## 2024-04-04 ENCOUNTER — Encounter (INDEPENDENT_AMBULATORY_CARE_PROVIDER_SITE_OTHER): Admitting: Ophthalmology

## 2024-04-04 DIAGNOSIS — N1832 Chronic kidney disease, stage 3b: Secondary | ICD-10-CM | POA: Diagnosis not present

## 2024-04-04 DIAGNOSIS — H35033 Hypertensive retinopathy, bilateral: Secondary | ICD-10-CM

## 2024-04-04 DIAGNOSIS — H34831 Tributary (branch) retinal vein occlusion, right eye, with macular edema: Secondary | ICD-10-CM

## 2024-04-04 DIAGNOSIS — G20B2 Parkinson's disease with dyskinesia, with fluctuations: Secondary | ICD-10-CM | POA: Diagnosis not present

## 2024-04-04 DIAGNOSIS — Z961 Presence of intraocular lens: Secondary | ICD-10-CM

## 2024-04-04 DIAGNOSIS — R1312 Dysphagia, oropharyngeal phase: Secondary | ICD-10-CM | POA: Diagnosis not present

## 2024-04-04 DIAGNOSIS — F419 Anxiety disorder, unspecified: Secondary | ICD-10-CM | POA: Diagnosis not present

## 2024-04-04 DIAGNOSIS — D649 Anemia, unspecified: Secondary | ICD-10-CM | POA: Diagnosis not present

## 2024-04-04 DIAGNOSIS — I10 Essential (primary) hypertension: Secondary | ICD-10-CM | POA: Diagnosis not present

## 2024-04-04 DIAGNOSIS — R41841 Cognitive communication deficit: Secondary | ICD-10-CM | POA: Diagnosis not present

## 2024-04-04 DIAGNOSIS — M5441 Lumbago with sciatica, right side: Secondary | ICD-10-CM | POA: Diagnosis not present

## 2024-04-04 DIAGNOSIS — F339 Major depressive disorder, recurrent, unspecified: Secondary | ICD-10-CM | POA: Diagnosis not present

## 2024-04-04 DIAGNOSIS — R1900 Intra-abdominal and pelvic swelling, mass and lump, unspecified site: Secondary | ICD-10-CM | POA: Diagnosis not present

## 2024-04-04 DIAGNOSIS — R5381 Other malaise: Secondary | ICD-10-CM | POA: Diagnosis not present

## 2024-04-04 DIAGNOSIS — M6281 Muscle weakness (generalized): Secondary | ICD-10-CM | POA: Diagnosis not present

## 2024-04-04 DIAGNOSIS — K219 Gastro-esophageal reflux disease without esophagitis: Secondary | ICD-10-CM | POA: Diagnosis not present

## 2024-04-05 DIAGNOSIS — R059 Cough, unspecified: Secondary | ICD-10-CM | POA: Diagnosis not present

## 2024-04-05 DIAGNOSIS — E876 Hypokalemia: Secondary | ICD-10-CM | POA: Diagnosis not present

## 2024-04-05 DIAGNOSIS — K529 Noninfective gastroenteritis and colitis, unspecified: Secondary | ICD-10-CM | POA: Diagnosis not present

## 2024-04-05 DIAGNOSIS — Z90710 Acquired absence of both cervix and uterus: Secondary | ICD-10-CM | POA: Diagnosis not present

## 2024-04-05 DIAGNOSIS — Z79899 Other long term (current) drug therapy: Secondary | ICD-10-CM | POA: Diagnosis not present

## 2024-04-05 DIAGNOSIS — I129 Hypertensive chronic kidney disease with stage 1 through stage 4 chronic kidney disease, or unspecified chronic kidney disease: Secondary | ICD-10-CM | POA: Diagnosis not present

## 2024-04-05 DIAGNOSIS — G20A1 Parkinson's disease without dyskinesia, without mention of fluctuations: Secondary | ICD-10-CM | POA: Diagnosis not present

## 2024-04-05 DIAGNOSIS — K219 Gastro-esophageal reflux disease without esophagitis: Secondary | ICD-10-CM | POA: Diagnosis not present

## 2024-04-05 DIAGNOSIS — N183 Chronic kidney disease, stage 3 unspecified: Secondary | ICD-10-CM | POA: Diagnosis not present

## 2024-04-05 DIAGNOSIS — E785 Hyperlipidemia, unspecified: Secondary | ICD-10-CM | POA: Diagnosis not present

## 2024-04-05 DIAGNOSIS — I959 Hypotension, unspecified: Secondary | ICD-10-CM | POA: Diagnosis not present

## 2024-04-05 DIAGNOSIS — R001 Bradycardia, unspecified: Secondary | ICD-10-CM | POA: Diagnosis not present

## 2024-04-05 DIAGNOSIS — I4510 Unspecified right bundle-branch block: Secondary | ICD-10-CM | POA: Diagnosis not present

## 2024-04-06 DIAGNOSIS — G20A1 Parkinson's disease without dyskinesia, without mention of fluctuations: Secondary | ICD-10-CM | POA: Diagnosis not present

## 2024-04-06 DIAGNOSIS — R001 Bradycardia, unspecified: Secondary | ICD-10-CM | POA: Diagnosis not present

## 2024-04-06 DIAGNOSIS — I10 Essential (primary) hypertension: Secondary | ICD-10-CM | POA: Diagnosis not present

## 2024-04-06 DIAGNOSIS — E876 Hypokalemia: Secondary | ICD-10-CM | POA: Diagnosis not present

## 2024-04-07 DIAGNOSIS — Z79899 Other long term (current) drug therapy: Secondary | ICD-10-CM | POA: Diagnosis not present

## 2024-04-07 DIAGNOSIS — I10 Essential (primary) hypertension: Secondary | ICD-10-CM | POA: Diagnosis not present

## 2024-04-07 DIAGNOSIS — E876 Hypokalemia: Secondary | ICD-10-CM | POA: Diagnosis not present

## 2024-04-07 DIAGNOSIS — G20A1 Parkinson's disease without dyskinesia, without mention of fluctuations: Secondary | ICD-10-CM | POA: Diagnosis not present

## 2024-04-07 DIAGNOSIS — R001 Bradycardia, unspecified: Secondary | ICD-10-CM | POA: Diagnosis not present

## 2024-04-08 DIAGNOSIS — W19XXXA Unspecified fall, initial encounter: Secondary | ICD-10-CM | POA: Diagnosis not present

## 2024-04-08 DIAGNOSIS — E7800 Pure hypercholesterolemia, unspecified: Secondary | ICD-10-CM | POA: Diagnosis not present

## 2024-04-08 DIAGNOSIS — R5381 Other malaise: Secondary | ICD-10-CM | POA: Diagnosis not present

## 2024-04-08 DIAGNOSIS — I4510 Unspecified right bundle-branch block: Secondary | ICD-10-CM | POA: Diagnosis not present

## 2024-04-08 DIAGNOSIS — M5441 Lumbago with sciatica, right side: Secondary | ICD-10-CM | POA: Diagnosis not present

## 2024-04-08 DIAGNOSIS — F339 Major depressive disorder, recurrent, unspecified: Secondary | ICD-10-CM | POA: Diagnosis not present

## 2024-04-08 DIAGNOSIS — R2689 Other abnormalities of gait and mobility: Secondary | ICD-10-CM | POA: Diagnosis not present

## 2024-04-08 DIAGNOSIS — E876 Hypokalemia: Secondary | ICD-10-CM | POA: Diagnosis not present

## 2024-04-08 DIAGNOSIS — I10 Essential (primary) hypertension: Secondary | ICD-10-CM | POA: Diagnosis not present

## 2024-04-08 DIAGNOSIS — R41841 Cognitive communication deficit: Secondary | ICD-10-CM | POA: Diagnosis not present

## 2024-04-08 DIAGNOSIS — R010 Benign and innocent cardiac murmurs: Secondary | ICD-10-CM | POA: Diagnosis not present

## 2024-04-08 DIAGNOSIS — E669 Obesity, unspecified: Secondary | ICD-10-CM | POA: Diagnosis not present

## 2024-04-08 DIAGNOSIS — N1832 Chronic kidney disease, stage 3b: Secondary | ICD-10-CM | POA: Diagnosis not present

## 2024-04-08 DIAGNOSIS — G20B2 Parkinson's disease with dyskinesia, with fluctuations: Secondary | ICD-10-CM | POA: Diagnosis not present

## 2024-04-08 DIAGNOSIS — E441 Mild protein-calorie malnutrition: Secondary | ICD-10-CM | POA: Diagnosis not present

## 2024-04-08 DIAGNOSIS — I959 Hypotension, unspecified: Secondary | ICD-10-CM | POA: Diagnosis not present

## 2024-04-08 DIAGNOSIS — M6289 Other specified disorders of muscle: Secondary | ICD-10-CM | POA: Diagnosis not present

## 2024-04-08 DIAGNOSIS — M6281 Muscle weakness (generalized): Secondary | ICD-10-CM | POA: Diagnosis not present

## 2024-04-08 DIAGNOSIS — Z743 Need for continuous supervision: Secondary | ICD-10-CM | POA: Diagnosis not present

## 2024-04-08 DIAGNOSIS — J309 Allergic rhinitis, unspecified: Secondary | ICD-10-CM | POA: Diagnosis not present

## 2024-04-08 DIAGNOSIS — R1900 Intra-abdominal and pelvic swelling, mass and lump, unspecified site: Secondary | ICD-10-CM | POA: Diagnosis not present

## 2024-04-08 DIAGNOSIS — D649 Anemia, unspecified: Secondary | ICD-10-CM | POA: Diagnosis not present

## 2024-04-08 DIAGNOSIS — F419 Anxiety disorder, unspecified: Secondary | ICD-10-CM | POA: Diagnosis not present

## 2024-04-08 DIAGNOSIS — Z515 Encounter for palliative care: Secondary | ICD-10-CM | POA: Diagnosis not present

## 2024-04-08 DIAGNOSIS — G20A1 Parkinson's disease without dyskinesia, without mention of fluctuations: Secondary | ICD-10-CM | POA: Diagnosis not present

## 2024-04-08 DIAGNOSIS — R1312 Dysphagia, oropharyngeal phase: Secondary | ICD-10-CM | POA: Diagnosis not present

## 2024-04-08 DIAGNOSIS — K219 Gastro-esophageal reflux disease without esophagitis: Secondary | ICD-10-CM | POA: Diagnosis not present

## 2024-04-08 DIAGNOSIS — Z79899 Other long term (current) drug therapy: Secondary | ICD-10-CM | POA: Diagnosis not present

## 2024-04-10 DIAGNOSIS — I10 Essential (primary) hypertension: Secondary | ICD-10-CM | POA: Diagnosis not present

## 2024-04-10 DIAGNOSIS — E876 Hypokalemia: Secondary | ICD-10-CM | POA: Diagnosis not present

## 2024-04-10 DIAGNOSIS — R5381 Other malaise: Secondary | ICD-10-CM | POA: Diagnosis not present

## 2024-04-11 DIAGNOSIS — D649 Anemia, unspecified: Secondary | ICD-10-CM | POA: Diagnosis not present

## 2024-04-11 DIAGNOSIS — I10 Essential (primary) hypertension: Secondary | ICD-10-CM | POA: Diagnosis not present

## 2024-04-11 DIAGNOSIS — G20B2 Parkinson's disease with dyskinesia, with fluctuations: Secondary | ICD-10-CM | POA: Diagnosis not present

## 2024-04-11 DIAGNOSIS — R5381 Other malaise: Secondary | ICD-10-CM | POA: Diagnosis not present

## 2024-04-11 DIAGNOSIS — R1900 Intra-abdominal and pelvic swelling, mass and lump, unspecified site: Secondary | ICD-10-CM | POA: Diagnosis not present

## 2024-04-11 DIAGNOSIS — M6281 Muscle weakness (generalized): Secondary | ICD-10-CM | POA: Diagnosis not present

## 2024-04-11 DIAGNOSIS — E876 Hypokalemia: Secondary | ICD-10-CM | POA: Diagnosis not present

## 2024-04-12 DIAGNOSIS — F419 Anxiety disorder, unspecified: Secondary | ICD-10-CM | POA: Diagnosis not present

## 2024-04-12 DIAGNOSIS — R2689 Other abnormalities of gait and mobility: Secondary | ICD-10-CM | POA: Diagnosis not present

## 2024-04-12 DIAGNOSIS — R5381 Other malaise: Secondary | ICD-10-CM | POA: Diagnosis not present

## 2024-04-12 DIAGNOSIS — M6281 Muscle weakness (generalized): Secondary | ICD-10-CM | POA: Diagnosis not present

## 2024-04-12 DIAGNOSIS — R41841 Cognitive communication deficit: Secondary | ICD-10-CM | POA: Diagnosis not present

## 2024-04-12 DIAGNOSIS — W19XXXA Unspecified fall, initial encounter: Secondary | ICD-10-CM | POA: Diagnosis not present

## 2024-04-12 DIAGNOSIS — G20B2 Parkinson's disease with dyskinesia, with fluctuations: Secondary | ICD-10-CM | POA: Diagnosis not present

## 2024-04-17 DIAGNOSIS — Z515 Encounter for palliative care: Secondary | ICD-10-CM | POA: Diagnosis not present

## 2024-04-18 DIAGNOSIS — R41841 Cognitive communication deficit: Secondary | ICD-10-CM | POA: Diagnosis not present

## 2024-04-18 DIAGNOSIS — G20B2 Parkinson's disease with dyskinesia, with fluctuations: Secondary | ICD-10-CM | POA: Diagnosis not present

## 2024-04-18 DIAGNOSIS — E876 Hypokalemia: Secondary | ICD-10-CM | POA: Diagnosis not present

## 2024-04-18 DIAGNOSIS — R5381 Other malaise: Secondary | ICD-10-CM | POA: Diagnosis not present

## 2024-04-18 DIAGNOSIS — I10 Essential (primary) hypertension: Secondary | ICD-10-CM | POA: Diagnosis not present

## 2024-04-19 DIAGNOSIS — F419 Anxiety disorder, unspecified: Secondary | ICD-10-CM | POA: Diagnosis not present

## 2024-04-19 DIAGNOSIS — D649 Anemia, unspecified: Secondary | ICD-10-CM | POA: Diagnosis not present

## 2024-04-19 DIAGNOSIS — N1832 Chronic kidney disease, stage 3b: Secondary | ICD-10-CM | POA: Diagnosis not present

## 2024-04-19 DIAGNOSIS — W19XXXA Unspecified fall, initial encounter: Secondary | ICD-10-CM | POA: Diagnosis not present

## 2024-04-19 DIAGNOSIS — E876 Hypokalemia: Secondary | ICD-10-CM | POA: Diagnosis not present

## 2024-04-19 DIAGNOSIS — R41841 Cognitive communication deficit: Secondary | ICD-10-CM | POA: Diagnosis not present

## 2024-04-19 DIAGNOSIS — M6281 Muscle weakness (generalized): Secondary | ICD-10-CM | POA: Diagnosis not present

## 2024-04-19 DIAGNOSIS — R1900 Intra-abdominal and pelvic swelling, mass and lump, unspecified site: Secondary | ICD-10-CM | POA: Diagnosis not present

## 2024-04-19 DIAGNOSIS — I10 Essential (primary) hypertension: Secondary | ICD-10-CM | POA: Diagnosis not present

## 2024-04-19 DIAGNOSIS — R5381 Other malaise: Secondary | ICD-10-CM | POA: Diagnosis not present

## 2024-04-19 DIAGNOSIS — R2689 Other abnormalities of gait and mobility: Secondary | ICD-10-CM | POA: Diagnosis not present

## 2024-04-19 DIAGNOSIS — G20B2 Parkinson's disease with dyskinesia, with fluctuations: Secondary | ICD-10-CM | POA: Diagnosis not present

## 2024-04-21 DIAGNOSIS — G4730 Sleep apnea, unspecified: Secondary | ICD-10-CM | POA: Diagnosis not present

## 2024-04-21 DIAGNOSIS — D631 Anemia in chronic kidney disease: Secondary | ICD-10-CM | POA: Diagnosis not present

## 2024-04-21 DIAGNOSIS — Z9049 Acquired absence of other specified parts of digestive tract: Secondary | ICD-10-CM | POA: Diagnosis not present

## 2024-04-21 DIAGNOSIS — M5441 Lumbago with sciatica, right side: Secondary | ICD-10-CM | POA: Diagnosis not present

## 2024-04-21 DIAGNOSIS — E876 Hypokalemia: Secondary | ICD-10-CM | POA: Diagnosis not present

## 2024-04-21 DIAGNOSIS — E7800 Pure hypercholesterolemia, unspecified: Secondary | ICD-10-CM | POA: Diagnosis not present

## 2024-04-21 DIAGNOSIS — K2970 Gastritis, unspecified, without bleeding: Secondary | ICD-10-CM | POA: Diagnosis not present

## 2024-04-21 DIAGNOSIS — G309 Alzheimer's disease, unspecified: Secondary | ICD-10-CM | POA: Diagnosis not present

## 2024-04-21 DIAGNOSIS — K219 Gastro-esophageal reflux disease without esophagitis: Secondary | ICD-10-CM | POA: Diagnosis not present

## 2024-04-21 DIAGNOSIS — F02B3 Dementia in other diseases classified elsewhere, moderate, with mood disturbance: Secondary | ICD-10-CM | POA: Diagnosis not present

## 2024-04-21 DIAGNOSIS — F02B18 Dementia in other diseases classified elsewhere, moderate, with other behavioral disturbance: Secondary | ICD-10-CM | POA: Diagnosis not present

## 2024-04-21 DIAGNOSIS — H34831 Tributary (branch) retinal vein occlusion, right eye, with macular edema: Secondary | ICD-10-CM | POA: Diagnosis not present

## 2024-04-21 DIAGNOSIS — I739 Peripheral vascular disease, unspecified: Secondary | ICD-10-CM | POA: Diagnosis not present

## 2024-04-21 DIAGNOSIS — I4510 Unspecified right bundle-branch block: Secondary | ICD-10-CM | POA: Diagnosis not present

## 2024-04-21 DIAGNOSIS — G20B2 Parkinson's disease with dyskinesia, with fluctuations: Secondary | ICD-10-CM | POA: Diagnosis not present

## 2024-04-21 DIAGNOSIS — R1312 Dysphagia, oropharyngeal phase: Secondary | ICD-10-CM | POA: Diagnosis not present

## 2024-04-21 DIAGNOSIS — F339 Major depressive disorder, recurrent, unspecified: Secondary | ICD-10-CM | POA: Diagnosis not present

## 2024-04-21 DIAGNOSIS — E669 Obesity, unspecified: Secondary | ICD-10-CM | POA: Diagnosis not present

## 2024-04-21 DIAGNOSIS — I129 Hypertensive chronic kidney disease with stage 1 through stage 4 chronic kidney disease, or unspecified chronic kidney disease: Secondary | ICD-10-CM | POA: Diagnosis not present

## 2024-04-21 DIAGNOSIS — Z6825 Body mass index (BMI) 25.0-25.9, adult: Secondary | ICD-10-CM | POA: Diagnosis not present

## 2024-04-21 DIAGNOSIS — N1832 Chronic kidney disease, stage 3b: Secondary | ICD-10-CM | POA: Diagnosis not present

## 2024-04-21 DIAGNOSIS — F02B4 Dementia in other diseases classified elsewhere, moderate, with anxiety: Secondary | ICD-10-CM | POA: Diagnosis not present

## 2024-04-21 DIAGNOSIS — H35033 Hypertensive retinopathy, bilateral: Secondary | ICD-10-CM | POA: Diagnosis not present

## 2024-04-21 DIAGNOSIS — M1990 Unspecified osteoarthritis, unspecified site: Secondary | ICD-10-CM | POA: Diagnosis not present

## 2024-05-02 DIAGNOSIS — N1832 Chronic kidney disease, stage 3b: Secondary | ICD-10-CM | POA: Diagnosis not present

## 2024-05-02 DIAGNOSIS — D649 Anemia, unspecified: Secondary | ICD-10-CM | POA: Diagnosis not present

## 2024-05-02 DIAGNOSIS — E876 Hypokalemia: Secondary | ICD-10-CM | POA: Diagnosis not present

## 2024-05-02 DIAGNOSIS — N9489 Other specified conditions associated with female genital organs and menstrual cycle: Secondary | ICD-10-CM | POA: Diagnosis not present

## 2024-05-02 DIAGNOSIS — G20A1 Parkinson's disease without dyskinesia, without mention of fluctuations: Secondary | ICD-10-CM | POA: Diagnosis not present

## 2024-05-02 DIAGNOSIS — E785 Hyperlipidemia, unspecified: Secondary | ICD-10-CM | POA: Diagnosis not present

## 2024-05-02 DIAGNOSIS — F322 Major depressive disorder, single episode, severe without psychotic features: Secondary | ICD-10-CM | POA: Diagnosis not present

## 2024-05-02 DIAGNOSIS — I129 Hypertensive chronic kidney disease with stage 1 through stage 4 chronic kidney disease, or unspecified chronic kidney disease: Secondary | ICD-10-CM | POA: Diagnosis not present

## 2024-05-02 DIAGNOSIS — R350 Frequency of micturition: Secondary | ICD-10-CM | POA: Diagnosis not present

## 2024-05-02 DIAGNOSIS — R630 Anorexia: Secondary | ICD-10-CM | POA: Diagnosis not present

## 2024-05-02 DIAGNOSIS — I1310 Hypertensive heart and chronic kidney disease without heart failure, with stage 1 through stage 4 chronic kidney disease, or unspecified chronic kidney disease: Secondary | ICD-10-CM | POA: Diagnosis not present

## 2024-05-02 DIAGNOSIS — G309 Alzheimer's disease, unspecified: Secondary | ICD-10-CM | POA: Diagnosis not present

## 2024-05-02 DIAGNOSIS — F02B3 Dementia in other diseases classified elsewhere, moderate, with mood disturbance: Secondary | ICD-10-CM | POA: Diagnosis not present

## 2024-05-04 DIAGNOSIS — N390 Urinary tract infection, site not specified: Secondary | ICD-10-CM | POA: Diagnosis not present

## 2024-05-04 DIAGNOSIS — R350 Frequency of micturition: Secondary | ICD-10-CM | POA: Diagnosis not present

## 2024-05-11 NOTE — Progress Notes (Signed)
 [facility] - Clinic Note  05/17/2024   CHIEF COMPLAINT Patient presents for Retina Follow Up  HISTORY OF PRESENT ILLNESS: Linda [name] is a 83 y.o. female who presents to the clinic today for:  HPI     Retina Follow Up   Patient presents with  CRVO/BRVO.  In right eye.  This started 11 weeks ago.  Duration of 11 weeks.  Since onset it is stable.  I, the attending physician,  performed the HPI with the patient and updated documentation appropriately.        Comments   11 week retina follow up BRVO OD and IVE OD pt is reporting no vision changes she denies any flashes has some floaters       Last edited by Valdemar Rogue, MD on 05/17/2024  6:36 PM.    Pt is delayed to follow up from 5 weeks to 11 weeks due to having surgery and being in rehab, pt states she is feeling better now   Referring physician: Shayne Anes, MD [address] [place],  [place] [address]  HISTORICAL INFORMATION:  Selected notes from the MEDICAL RECORD NUMBER Referred by Dr. Darroll for BRVO OD LEE: 11.12.24, BCVA OD: CF@1 ', OS: 20/40-2 Ocular Hx- BRVO OD, Dry Eye Syndrome, pseudophakia OU PMH- HTN   CURRENT MEDICATIONS: No current outpatient medications on file. (Ophthalmic Drugs)   No current facility-administered medications for this visit. (Ophthalmic Drugs)   Current Outpatient Medications (Other)  Medication Sig   acetaminophen  (TYLENOL ) 325 MG tablet Take 650 mg by mouth every 6 (six) hours as needed.   ALPRAZolam (XANAX) 0.5 MG tablet Take 0.5 mg by mouth daily.   benazepril (LOTENSIN) 40 MG tablet Take 40 mg by mouth daily.   calcium  carbonate (TUMS - DOSED IN MG ELEMENTAL CALCIUM ) 500 MG chewable tablet Chew 1 tablet by mouth daily.   Calcium -Vitamin D-Vitamin K (VIACTIV CALCIUM  PLUS D PO) Take 2 tablets by mouth 2 (two) times daily.   desvenlafaxine (PRISTIQ) 100 MG 24 hr tablet Take 100 mg by mouth daily.    fosfomycin (MONUROL) 3 g PACK Take 3 g by mouth once.    lamoTRIgine (LAMICTAL) 100 MG tablet Take 100 mg by mouth.   loratadine (CLARITIN) 10 MG tablet Take 10 mg by mouth daily.   Multiple Vitamins-Minerals (ONE-A-DAY WOMENS 50+ ADVANTAGE) TABS Take 1 tablet by mouth daily.   nystatin (MYCOSTATIN/NYSTOP) powder Apply 1 Application topically.   omeprazole  (PRILOSEC) 40 MG capsule TAKE ONE CAPSULE (40MG  TOTAL) BY MOUTH DAILY   ondansetron  (ZOFRAN ) 8 MG tablet Take 1 tablet (8 mg total) by mouth every 8 (eight) hours as needed for nausea.   polyethylene glycol (MIRALAX) 17 g packet MiraLax   propranolol (INDERAL) 10 MG tablet Take 10 mg by mouth 2 (two) times daily.   rosuvastatin  (CRESTOR ) 20 MG tablet Take 1 tablet (20 mg total) by mouth daily.   spironolactone  (ALDACTONE ) 25 MG tablet TAKE ONE TABLET (25MG  TOTAL) BY MOUTH DAILY   triamcinolone cream (KENALOG) 0.1 % apply to the itching areas TID prn   No current facility-administered medications for this visit. (Other)   REVIEW OF SYSTEMS: ROS   Positive for: Neurological, Musculoskeletal, Eyes Negative for: Cardiovascular Last edited by Resa Delon ORN, COT on 05/17/2024 12:30 PM.         ALLERGIES Allergies  Allergen Reactions   Amoxicillin Hives   Cefdinir Other (See Comments)   Other Other (See Comments)   Penicillin G Other (See Comments)  Abilify [Aripiprazole] Other (See Comments)    Makes pt tremble   PAST MEDICAL HISTORY Past Medical History:  Diagnosis Date   Anxiety    Candida esophagitis (HCC)    Chronic kidney disease    DDD (degenerative disc disease)    Depression    Erosive gastritis    Falls    GERD (gastroesophageal reflux disease)    Hiatal hernia    High cholesterol    History of anal fissures    Hypertension    Obesity    Osteoarthritis    Panic disorder    PONV (postoperative nausea and vomiting)    Sleep apnea    Does not need CPAP anymore   Past Surgical History:  Procedure Laterality Date   ABDOMINAL HYSTERECTOMY  1987   LSO /  menorrhagia & leiomyomata   APPENDECTOMY     BREAST EXCISIONAL BIOPSY Left 1989   benign   BREAST EXCISIONAL BIOPSY Left 07/11/2020   benign   BREAST SURGERY  1989   benign tumor left breast   CHOLECYSTECTOMY  2001   TUBAL LIGATION     FAMILY HISTORY Family History  Problem Relation Age of Onset   Heart disease Mother    Lung cancer Father    Diabetes Sister    Hypertension Sister    Ovarian cancer Sister    Diabetes Brother    Heart disease Brother    Heart attack Brother    Heart disease Brother    Heart attack Brother    Heart disease Brother    Heart attack Brother    Colon cancer Neg Hx    BRCA 1/2 Neg Hx    Breast cancer Neg Hx    SOCIAL HISTORY Social History   Tobacco Use   Smoking status: Never    Passive exposure: Never   Smokeless tobacco: Never  Vaping Use   Vaping status: Never Used  Substance Use Topics   Alcohol  use: No   Drug use: No       OPHTHALMIC EXAM:  Base Eye Exam     Visual Acuity (Snellen - Linear)       Right Left   Dist [place] 20/80 -3 20/30 -1   Dist ph [place] NI NI         Tonometry (Tonopen, 12:34 PM)       Right Left   Pressure 11 12         Pupils       Pupils Dark Light Shape React APD   Right PERRL 3 2 Round Brisk None   Left PERRL 3 2 Round Brisk None         Visual Fields       Left Right    Full Full         Extraocular Movement       Right Left    Full, Ortho Full, Ortho         Neuro/Psych     Oriented x3: Yes   Mood/Affect: Normal         Dilation     Both eyes: 2.5% Phenylephrine  @ 12:34 PM           Slit Lamp and Fundus Exam     Slit Lamp Exam       Right Left   Lids/Lashes Dermatochalasis - upper lid, mild MGD Dermatochalasis - upper lid, mild MGD   Conjunctiva/Sclera White and quiet White and quiet   Cornea well healed cataract wound, tear film  debris well healed cataract wound, tear film debris   Anterior Chamber deep and clear deep and clear   Iris Round and  dilated Round and dilated   Lens PC IOL in good position PC IOL in good position   Anterior Vitreous  Posterior vitreous detachment         Fundus Exam       Right Left   Disc Pink and Sharp, mild tilt, temporal PPA mild Pallor, mild tilt, Compact, PPA   C/D Ratio 0.2 0.2   Macula Blunted foveal reflex, persistent central edema - slightly increased, confluent IRH / flame hemes superior macula -- improved, mild ERM Flat, Blunted foveal reflex, RPE mottling, No heme or edema   Vessels attenuated, Tortuous, ST BRVO -- severe attenuation of ST vessels attenuated, Tortuous   Periphery Attached, +DBH ST quadrant -- improving, peripheral drusen, mild reticular degeneration Attached, mild reticular degeneration, No heme           Refraction     Wearing Rx       Sphere Cylinder Axis Add   Right -0.50 +0.25 085 +2.50   Left -1.50 +0.75 055 +2.50           IMAGING AND PROCEDURES  Imaging and Procedures for 05/17/2024  OCT, Retina - OU - Both Eyes       Right Eye Quality was good. Central Foveal Thickness: 364. Progression has worsened. Findings include no SRF, abnormal foveal contour, intraretinal hyper-reflective material, epiretinal membrane, intraretinal fluid (Mild interval increase in central edema with superior extension, partial PVD).   Left Eye Quality was good. Central Foveal Thickness: 261. Progression has been stable. Findings include normal foveal contour, no IRF, no SRF.   Notes *Images captured and stored on drive  Diagnosis / Impression:  OD: BRVO - mild interval increase in central edema with superior extension, partial PVD OS: NFP, no IRF/SRF  Clinical management:  See below  Abbreviations: NFP - Normal foveal profile. CME - cystoid macular edema. PED - pigment epithelial detachment. IRF - intraretinal fluid. SRF - subretinal fluid. EZ - ellipsoid zone. ERM - epiretinal membrane. ORA - outer retinal atrophy. ORT - outer retinal tubulation. SRHM -  subretinal hyper-reflective material. IRHM - intraretinal hyper-reflective material      Intravitreal Injection, Pharmacologic Agent - OD - Right Eye       Time Out 05/17/2024. 1:23 PM. Confirmed correct patient, procedure, site, and patient consented.   Anesthesia Topical anesthesia was used. Anesthetic medications included Lidocaine  2%, Proparacaine 0.5%.   Procedure Preparation included 5% betadine  to ocular surface, eyelid speculum. A (32g) needle was used.   Injection: 2 mg aflibercept  2 MG/0.05ML   Route: Intravitreal, Site: Right Eye   NDC: [id number], Lot: [id number], Expiration date: 08/18/2025, Waste: 0 mL   Post-op Post injection exam found visual acuity of at least counting fingers. The patient tolerated the procedure well. There were no complications. The patient received written and verbal post procedure care education. Post injection medications were not given.           ASSESSMENT/PLAN:   ICD-10-CM   1. Branch retinal vein occlusion of right eye with macular edema  H34.8310 OCT, Retina - OU - Both Eyes    Intravitreal Injection, Pharmacologic Agent - OD - Right Eye    aflibercept  (EYLEA ) SOLN 2 mg    2. Essential hypertension  I10     3. Hypertensive retinopathy of both eyes  H35.033     4. Pseudophakia, both eyes  Z96.1      1. BRVO w/ CME OD  - delayed f/u from 5 weeks to 11 weeks (05.13.25 to 07.30.25)  - s/p IVA OD #1 (11.21.24), #2 (12.19.24), #3 (01.16.25) - IVA resistance  ==========================  - s/p IVE OD #1 (02.13.25), #2 (03.18.25), #3 (04.15.25), #4 (05.13.25) - BCVA OD 20/80 -- stable - exam shows IRH superior macula w/ extension of DBH to superior periphery -- improving - OCT shows OD: mild interval increase in central edema with superior extension, partial PVD at 11 weeks - recommend IVE OD #5 today, 07.30.25 w/ f/u back to 6 wks  - pt wishes to proceed with injection -- pt covering 20% coinsurance for medication - RBA of  procedure discussed, questions answered - IVE informed consent obtained and signed, 02.13.25 (OD) - see procedure note - F/U 6 weeks -- DFE/OCT/possible injection  2,3. Hypertensive retinopathy OU - discussed importance of tight BP control - monitor  4. Pseudophakia OU  - s/p CE/IOL OU (March 2023; Dr. Harrie)  - IOL in good position, doing well  - monitor  Ophthalmic Meds Ordered this visit:  Meds ordered this encounter  Medications   aflibercept  (EYLEA ) SOLN 2 mg    Return in about 6 weeks (around 06/28/2024) for f/u BRVO OD, DFE, OCT, Possible Injxn.  There are no Patient Instructions on file for this visit.  This document serves as a record of services personally performed by Redell JUDITHANN Hans, MD, PhD. It was created on their behalf by Almetta Pesa, an ophthalmic technician. The creation of this record is the provider's dictation and/or activities during the visit.    Electronically signed by: Almetta Pesa, OA, 05/21/24  10:04 PM  This document serves as a record of services personally performed by Redell JUDITHANN Hans, MD, PhD. It was created on their behalf by Alan PARAS. Delores, OA an ophthalmic technician. The creation of this record is the provider's dictation and/or activities during the visit.    Electronically signed by: Alan PARAS. Delores, OA 05/21/24 10:04 PM  Redell JUDITHANN Hans, M.D., Ph.D. Diseases & Surgery of the Retina and Vitreous [facility]  I have reviewed the above documentation for accuracy and completeness, and I agree with the above. Redell JUDITHANN Hans, M.D., Ph.D. 05/21/24 10:06 PM   Abbreviations: M myopia (nearsighted); A astigmatism; H hyperopia (farsighted); P presbyopia; Mrx spectacle prescription;  CTL contact lenses; OD right eye; OS left eye; OU both eyes  XT exotropia; ET esotropia; PEK punctate epithelial keratitis; PEE punctate epithelial erosions; DES dry eye syndrome; MGD meibomian gland dysfunction; ATs artificial tears;  PFAT's preservative free artificial tears; NSC nuclear sclerotic cataract; PSC posterior subcapsular cataract; ERM epi-retinal membrane; PVD posterior vitreous detachment; RD retinal detachment; DM diabetes mellitus; DR diabetic retinopathy; NPDR non-proliferative diabetic retinopathy; PDR proliferative diabetic retinopathy; CSME clinically significant macular edema; DME diabetic macular edema; dbh dot blot hemorrhages; CWS cotton wool spot; POAG primary open angle glaucoma; C/D cup-to-disc ratio; HVF humphrey visual field; GVF goldmann visual field; OCT optical coherence tomography; IOP intraocular pressure; BRVO Branch retinal vein occlusion; CRVO central retinal vein occlusion; CRAO central retinal artery occlusion; BRAO branch retinal artery occlusion; RT retinal tear; SB scleral buckle; PPV pars plana vitrectomy; VH Vitreous hemorrhage; PRP panretinal laser photocoagulation; IVK intravitreal kenalog; VMT vitreomacular traction; MH Macular hole;  NVD neovascularization of the disc; NVE neovascularization elsewhere; AREDS age related eye disease study; ARMD age related macular degeneration; POAG primary open angle glaucoma; EBMD epithelial/anterior basement membrane  dystrophy; ACIOL anterior chamber intraocular lens; IOL intraocular lens; PCIOL posterior chamber intraocular lens; Phaco/IOL phacoemulsification with intraocular lens placement; PRK photorefractive keratectomy; LASIK laser assisted in situ keratomileusis; HTN hypertension; DM diabetes mellitus; COPD chronic obstructive pulmonary disease

## 2024-05-13 ENCOUNTER — Emergency Department (HOSPITAL_COMMUNITY)

## 2024-05-13 ENCOUNTER — Other Ambulatory Visit: Payer: Self-pay

## 2024-05-13 ENCOUNTER — Emergency Department (HOSPITAL_COMMUNITY)
Admission: EM | Admit: 2024-05-13 | Discharge: 2024-05-13 | Disposition: A | Discharge status: Home / Self Care | Attending: Emergency Medicine | Admitting: Emergency Medicine

## 2024-05-13 ENCOUNTER — Encounter (HOSPITAL_COMMUNITY): Payer: Self-pay

## 2024-05-13 DIAGNOSIS — G20C Parkinsonism, unspecified: Secondary | ICD-10-CM | POA: Diagnosis not present

## 2024-05-13 DIAGNOSIS — N183 Chronic kidney disease, stage 3 unspecified: Secondary | ICD-10-CM | POA: Insufficient documentation

## 2024-05-13 DIAGNOSIS — R1030 Lower abdominal pain, unspecified: Secondary | ICD-10-CM | POA: Diagnosis not present

## 2024-05-13 DIAGNOSIS — R1031 Right lower quadrant pain: Secondary | ICD-10-CM | POA: Diagnosis not present

## 2024-05-13 DIAGNOSIS — R109 Unspecified abdominal pain: Secondary | ICD-10-CM

## 2024-05-13 DIAGNOSIS — F02818 Dementia in other diseases classified elsewhere, unspecified severity, with other behavioral disturbance: Secondary | ICD-10-CM | POA: Insufficient documentation

## 2024-05-13 DIAGNOSIS — I129 Hypertensive chronic kidney disease with stage 1 through stage 4 chronic kidney disease, or unspecified chronic kidney disease: Secondary | ICD-10-CM | POA: Diagnosis not present

## 2024-05-13 DIAGNOSIS — Z79899 Other long term (current) drug therapy: Secondary | ICD-10-CM | POA: Diagnosis not present

## 2024-05-13 DIAGNOSIS — F03918 Unspecified dementia, unspecified severity, with other behavioral disturbance: Secondary | ICD-10-CM

## 2024-05-13 LAB — URINALYSIS, ROUTINE W REFLEX MICROSCOPIC
Bilirubin Urine: NEGATIVE
Glucose, UA: NEGATIVE mg/dL
Hgb urine dipstick: NEGATIVE
Ketones, ur: NEGATIVE mg/dL
Leukocytes,Ua: NEGATIVE
Nitrite: NEGATIVE
Protein, ur: NEGATIVE mg/dL
Specific Gravity, Urine: 1.009 (ref 1.005–1.030)
pH: 7 (ref 5.0–8.0)

## 2024-05-13 LAB — CBC WITH DIFFERENTIAL/PLATELET
Abs Immature Granulocytes: 0.02 K/uL (ref 0.00–0.07)
Basophils Absolute: 0.1 K/uL (ref 0.0–0.1)
Basophils Relative: 1 %
Eosinophils Absolute: 0.2 K/uL (ref 0.0–0.5)
Eosinophils Relative: 3 %
HCT: 34.3 % — ABNORMAL LOW (ref 36.0–46.0)
Hemoglobin: 11 g/dL — ABNORMAL LOW (ref 12.0–15.0)
Immature Granulocytes: 0 %
Lymphocytes Relative: 29 %
Lymphs Abs: 1.9 K/uL (ref 0.7–4.0)
MCH: 30.9 pg (ref 26.0–34.0)
MCHC: 32.1 g/dL (ref 30.0–36.0)
MCV: 96.3 fL (ref 80.0–100.0)
Monocytes Absolute: 0.5 K/uL (ref 0.1–1.0)
Monocytes Relative: 7 %
Neutro Abs: 3.9 K/uL (ref 1.7–7.7)
Neutrophils Relative %: 60 %
Platelets: 269 K/uL (ref 150–400)
RBC: 3.56 MIL/uL — ABNORMAL LOW (ref 3.87–5.11)
RDW: 12.8 % (ref 11.5–15.5)
WBC: 6.5 K/uL (ref 4.0–10.5)
nRBC: 0 % (ref 0.0–0.2)

## 2024-05-13 LAB — COMPREHENSIVE METABOLIC PANEL WITH GFR
ALT: 5 U/L (ref 0–44)
AST: 25 U/L (ref 15–41)
Albumin: 3.1 g/dL — ABNORMAL LOW (ref 3.5–5.0)
Alkaline Phosphatase: 70 U/L (ref 38–126)
Anion gap: 11 (ref 5–15)
BUN: 14 mg/dL (ref 8–23)
CO2: 26 mmol/L (ref 22–32)
Calcium: 9.3 mg/dL (ref 8.9–10.3)
Chloride: 104 mmol/L (ref 98–111)
Creatinine, Ser: 1.15 mg/dL — ABNORMAL HIGH (ref 0.44–1.00)
GFR, Estimated: 48 mL/min — ABNORMAL LOW (ref >60)
Glucose, Bld: 105 mg/dL — ABNORMAL HIGH (ref 70–99)
Potassium: 3.7 mmol/L (ref 3.5–5.1)
Sodium: 141 mmol/L (ref 135–145)
Total Bilirubin: 0.8 mg/dL (ref 0.0–1.2)
Total Protein: 6.1 g/dL — ABNORMAL LOW (ref 6.5–8.1)

## 2024-05-13 LAB — LIPASE, BLOOD: Lipase: 28 U/L (ref 11–51)

## 2024-05-13 MED ORDER — IOHEXOL 300 MG/ML  SOLN: 100.0000 mL | Freq: Once | INTRAMUSCULAR | Status: DC | PRN

## 2024-05-13 MED ORDER — SODIUM CHLORIDE 0.9 % IV BOLUS
1000.0000 mL | Freq: Once | INTRAVENOUS | Status: AC
  Administered 2024-05-13: 1000 mL via INTRAVENOUS

## 2024-05-13 NOTE — ED Provider Notes (Signed)
 [place] EMERGENCY DEPARTMENT AT [facility] Provider Note  CSN: [id number] Arrival date & time: 05/13/24 1001  Chief Complaint(s) Abdominal Pain  HPI Linda [name] is a 83 y.o. female with past medical history as below, significant for CKD, gastritis, HLD, Parkinson's disease, dementia who presents to the ED with complaint of concern for UTI, possible medication side effect  Patient is here with spouse who is primary caretaker.  Spouse reports she was recently started on sertraline yesterday.  This morning he went to wake her up and get her ready for the day and she was more sleepy than normal, increased fatigue compared to her baseline.  Did not want to eat breakfast.  Following arrival to the ER she is much more alert, she is asking for something to eat and seems back to her baseline.  She is complaining of some suprapubic discomfort, concern for possible UTI.  No nausea or vomiting.  No chest pain or dyspnea.  No fevers.  No falls.  Past Medical History Past Medical History:  Diagnosis Date   Anxiety    Candida esophagitis (HCC)    Chronic kidney disease    DDD (degenerative disc disease)    Depression    Erosive gastritis    Falls    GERD (gastroesophageal reflux disease)    Hiatal hernia    High cholesterol    History of anal fissures    Hypertension    Obesity    Osteoarthritis    Panic disorder    PONV (postoperative nausea and vomiting)    Sleep apnea    Does not need CPAP anymore   Patient Active Problem List   Diagnosis Date Noted   Normocytic anemia 03/16/2024   CKD (chronic kidney disease) stage 3, GFR 30-59 ml/min (HCC) 03/16/2024   Allergic rhinitis 12/15/2021   Bradycardia 12/15/2021   Pain in right knee 02/17/2019   Cardiac murmur, unspecified 08/08/2018   Acute right-sided low back pain with right-sided sciatica 06/13/2018   Recurrent falls 06/10/2018   Recurrent major depression (HCC) 05/14/2016   Constipation 04/10/2015    Palpitations 02/15/2014   Hypertension    High cholesterol    Anxiety    Proteinuria 09/04/2010   Right bundle branch block 08/23/2009   Obstructive sleep apnea syndrome 07/05/2009   Gastro-esophageal reflux disease without esophagitis 07/05/2009   Home Medication(s) Prior to Admission medications   Medication Sig Start Date End Date Taking? Authorizing Provider  acetaminophen  (TYLENOL ) 325 MG tablet Take 650 mg by mouth every 6 (six) hours as needed.    [provider]  ALPRAZolam (XANAX) 0.5 MG tablet Take 0.5 mg by mouth daily. 08/20/20   [provider]  benazepril (LOTENSIN) 40 MG tablet Take 40 mg by mouth daily.    [provider]  calcium  carbonate (TUMS - DOSED IN MG ELEMENTAL CALCIUM ) 500 MG chewable tablet Chew 1 tablet by mouth daily.    [provider]  Calcium -Vitamin D-Vitamin K (VIACTIV CALCIUM  PLUS D PO) Take 2 tablets by mouth 2 (two) times daily.    [provider]  desvenlafaxine (PRISTIQ) 100 MG 24 hr tablet Take 100 mg by mouth daily.  05/03/18   [provider]  fosfomycin (MONUROL) 3 g PACK Take 3 g by mouth once. 07/09/23   [provider]  lamoTRIgine (LAMICTAL) 100 MG tablet Take 100 mg by mouth. 01/17/24   [provider]  loratadine (CLARITIN) 10 MG tablet Take 10 mg by mouth daily.  [provider]  Multiple Vitamins-Minerals (ONE-A-DAY WOMENS 50+ ADVANTAGE) TABS Take 1 tablet by mouth daily. 03/13/14   [provider]  nystatin (MYCOSTATIN/NYSTOP) powder Apply 1 Application topically. 01/25/24   [provider]  omeprazole  (PRILOSEC) 40 MG capsule TAKE ONE CAPSULE (40MG  TOTAL) BY MOUTH DAILY 10/21/23   Abran Norleen SAILOR, MD  ondansetron  (ZOFRAN ) 8 MG tablet Take 1 tablet (8 mg total) by mouth every 8 (eight) hours as needed for nausea. 01/07/17   Esterwood, Amy S, PA-C  polyethylene glycol (MIRALAX) 17 g packet MiraLax    [provider]  propranolol (INDERAL) 10 MG  tablet Take 10 mg by mouth 2 (two) times daily. 11/11/21   [provider]  rosuvastatin  (CRESTOR ) 20 MG tablet Take 1 tablet (20 mg total) by mouth daily. 02/04/22   Loni Soyla LABOR, MD  spironolactone  (ALDACTONE ) 25 MG tablet TAKE ONE TABLET (25MG  TOTAL) BY MOUTH DAILY 05/04/23   Loni Soyla LABOR, MD  triamcinolone cream (KENALOG) 0.1 % apply to the itching areas TID prn 02/20/19   [provider]                                                                                                                                    Past Surgical History Past Surgical History:  Procedure Laterality Date   ABDOMINAL HYSTERECTOMY  1987   LSO / menorrhagia & leiomyomata   APPENDECTOMY     BREAST EXCISIONAL BIOPSY Left 1989   benign   BREAST EXCISIONAL BIOPSY Left 07/11/2020   benign   BREAST SURGERY  1989   benign tumor left breast   CHOLECYSTECTOMY  2001   TUBAL LIGATION     Family History Family History  Problem Relation Age of Onset   Heart disease Mother    Lung cancer Father    Diabetes Sister    Hypertension Sister    Ovarian cancer Sister    Diabetes Brother    Heart disease Brother    Heart attack Brother    Heart disease Brother    Heart attack Brother    Heart disease Brother    Heart attack Brother    Colon cancer Neg Hx    BRCA 1/2 Neg Hx    Breast cancer Neg Hx     Social History Social History   Tobacco Use   Smoking status: Never    Passive exposure: Never   Smokeless tobacco: Never  Vaping Use   Vaping status: Never Used  Substance Use Topics   Alcohol  use: No   Drug use: No   Allergies Amoxicillin, Cefdinir, Other, Penicillin g, and Abilify [aripiprazole]  Review of Systems A thorough review of systems was obtained and all systems are negative except as noted in the HPI and PMH.   Physical Exam Vital Signs  I have reviewed the triage vital signs BP 133/61   Pulse 60   Temp 98.1 F (36.7  C)   Resp 20   Ht 5' 5 (1.651 m)    Wt 58.1 kg   SpO2 96%   BMI 21.30 kg/m  Physical Exam Vitals and nursing note reviewed.  Constitutional:      General: She is not in acute distress.    Appearance: Normal appearance.  HENT:     Head: Normocephalic and atraumatic.     Right Ear: External ear normal.     Left Ear: External ear normal.     Nose: Nose normal.     Mouth/Throat:     Mouth: Mucous membranes are moist.  Eyes:     General: No scleral icterus.       Right eye: No discharge.        Left eye: No discharge.  Cardiovascular:     Rate and Rhythm: Normal rate and regular rhythm.     Pulses: Normal pulses.     Heart sounds: Normal heart sounds.  Pulmonary:     Effort: Pulmonary effort is normal. No respiratory distress.     Breath sounds: Normal breath sounds. No stridor.  Abdominal:     General: Abdomen is flat. There is no distension.     Palpations: Abdomen is soft.     Tenderness: There is abdominal tenderness in the suprapubic area.  Musculoskeletal:     Cervical back: No rigidity.     Right lower leg: No edema.     Left lower leg: No edema.  Skin:    General: Skin is warm and dry.     Capillary Refill: Capillary refill takes less than 2 seconds.  Neurological:     Mental Status: She is alert.  Psychiatric:        Mood and Affect: Mood normal.        Behavior: Behavior normal. Behavior is cooperative.     ED Results and Treatments Labs (all labs ordered are listed, but only abnormal results are displayed) Labs Reviewed  COMPREHENSIVE METABOLIC PANEL WITH GFR - Abnormal; Notable for the following components:      Result Value   Glucose, Bld 105 (*)    Creatinine, Ser 1.15 (*)    Total Protein 6.1 (*)    Albumin 3.1 (*)    GFR, Estimated 48 (*)    All other components within normal limits  CBC WITH DIFFERENTIAL/PLATELET - Abnormal; Notable for the following components:   RBC 3.56 (*)    Hemoglobin 11.0 (*)    HCT 34.3 (*)    All other components within normal limits  LIPASE, BLOOD   URINALYSIS, ROUTINE W REFLEX MICROSCOPIC  CBC WITH DIFFERENTIAL/PLATELET                                                                                                                          Radiology No results found.   Pertinent labs & imaging results that were available during my care of the patient were reviewed by me and considered in my medical  decision making (see MDM for details).  Medications Ordered in ED Medications  sodium chloride  0.9 % bolus 1,000 mL (0 mLs Intravenous Stopped 05/13/24 1506)                                                                                                                                     Procedures Procedures  (including critical care time)  Medical Decision Making / ED Course    Medical Decision Making:    ADISYNN SULEIMAN is a 83 y.o. female with past medical history as below, significant for CKD, gastritis, HLD, Parkinson's disease, dementia who presents to the ED with complaint of concern for UTI, possible medication side effect. The complaint involves an extensive differential diagnosis and also carries with it a high risk of complications and morbidity.  Serious etiology was considered. Ddx includes but is not limited to: Differential diagnosis includes but is not exclusive to , ovarian cyst, ovarian torsion, acute appendicitis, urinary tract infection, endometriosis, bowel obstruction, hernia, colitis, renal colic, gastroenteritis, volvulus, prescription medications, infection, uremia, hypoglycemia etc.    Complete initial physical exam performed, notably the patient was in no acute distress.    Reviewed and confirmed nursing documentation for past medical history, family history, social history.  Vital signs reviewed.    Suprapubic pain> - Labs stable, urine stable, CT stable. - Pain resolved on recheck, voiding spontaneously  AMS Dementia> - History of dementia, recent started on sertraline.  Neurologically she is  intact, no evidence of stroke; report of increased fatigue/sleepy this AM which has since resolved.  Patient intermittently agitated, she has periodic behavior disturbances and episodes of fatigue and agitation per spouse - Symptoms resolved, she is back to baseline, possible progression of her dementia or recent medication adjustment. - She is back to baseline, no report of any neurologic deficits from family, no neurologic deficits on exam.  Symptoms have resolved, her workup here is reassuring, she is ambulatory at her typical level, she is at her baseline per spouse at bedside given her history of dementia.  She is tolerating p.o. without difficulty.  This time recommend close outpatient follow-up with PCP, strict return precautions provided    Patient in no distress and overall condition is stable. Detailed discussions were had with the patient/guardian regarding current findings, and need for close f/u with PCP or on call doctor. The patient/guardian has been instructed to return immediately if the symptoms worsen in any way for re-evaluation. Patient/guardian verbalized understanding and is in agreement with current care plan. All questions answered prior to discharge.                     Additional history obtained: -Additional history obtained from family -External records from outside source obtained and reviewed including: Chart review including previous notes, labs, imaging, consultation notes including  Medication list, prior admission   Lab Tests: -I ordered, reviewed, and interpreted labs.  The pertinent results include:   Labs Reviewed  COMPREHENSIVE METABOLIC PANEL WITH GFR - Abnormal; Notable for the following components:      Result Value   Glucose, Bld 105 (*)    Creatinine, Ser 1.15 (*)    Total Protein 6.1 (*)    Albumin 3.1 (*)    GFR, Estimated 48 (*)    All other components within normal limits  CBC WITH DIFFERENTIAL/PLATELET - Abnormal; Notable  for the following components:   RBC 3.56 (*)    Hemoglobin 11.0 (*)    HCT 34.3 (*)    All other components within normal limits  LIPASE, BLOOD  URINALYSIS, ROUTINE W REFLEX MICROSCOPIC  CBC WITH DIFFERENTIAL/PLATELET    Notable for labs stable  EKG   EKG Interpretation Date/Time:    Ventricular Rate:    PR Interval:    QRS Duration:    QT Interval:    QTC Calculation:   R Axis:      Text Interpretation:           Imaging Studies ordered: I ordered imaging studies including CTAP I independently visualized the following imaging with scope of interpretation limited to determining acute life threatening conditions related to emergency care; findings noted above I agree with the radiologist interpretation If any imaging was obtained with contrast I closely monitored patient for any possible adverse reaction a/w contrast administration in the emergency department   Medicines ordered and prescription drug management: Meds ordered this encounter  Medications   sodium chloride  0.9 % bolus 1,000 mL   DISCONTD: iohexol  (OMNIPAQUE ) 300 MG/ML solution 100 mL    -I have reviewed the patients home medicines and have made adjustments as needed   Consultations Obtained: Not applicable  Cardiac Monitoring: Continuous pulse oximetry interpreted by myself, 97% on RA.    Social Determinants of Health:  Diagnosis or treatment significantly limited by social determinants of health: na   Reevaluation: After the interventions noted above, I reevaluated the patient and found that they have resolved  Co morbidities that complicate the patient evaluation  Past Medical History:  Diagnosis Date   Anxiety    Candida esophagitis (HCC)    Chronic kidney disease    DDD (degenerative disc disease)    Depression    Erosive gastritis    Falls    GERD (gastroesophageal reflux disease)    Hiatal hernia    High cholesterol    History of anal fissures    Hypertension    Obesity     Osteoarthritis    Panic disorder    PONV (postoperative nausea and vomiting)    Sleep apnea    Does not need CPAP anymore      Dispostion: Disposition decision including need for hospitalization was considered, and patient discharged from emergency department.    Final Clinical Impression(s) / ED Diagnoses Final diagnoses:  Abdominal pain, unspecified abdominal location  Dementia with other behavioral disturbance, unspecified dementia severity, unspecified dementia type (HCC)        Elnor Jayson LABOR, DO 05/16/24 1000

## 2024-05-13 NOTE — ED Notes (Signed)
 Pt spouse concerned with pt sleeping more with the new meds since yesterday. Has the new med in pt room to review with EDP.

## 2024-05-13 NOTE — Discharge Instructions (Addendum)
 It was a pleasure caring for you today in the emergency department.  Please call PCP to arrange follow up next week for recheck and review of medications  Please return to the emergency department for any worsening or worrisome symptoms.

## 2024-05-13 NOTE — ED Triage Notes (Signed)
 Pt husband brought pt in because he says she wouldn't eat this am. States pt meds were changed yesterday at dr. And says she has been excessively harder to awaken since beginning new regimen. Hx of parkinson's, HTN, and Dementia.

## 2024-05-13 NOTE — ED Notes (Signed)
Pt care taken 

## 2024-05-17 ENCOUNTER — Encounter (INDEPENDENT_AMBULATORY_CARE_PROVIDER_SITE_OTHER): Payer: Self-pay | Admitting: Ophthalmology

## 2024-05-17 ENCOUNTER — Ambulatory Visit (INDEPENDENT_AMBULATORY_CARE_PROVIDER_SITE_OTHER): Admitting: Ophthalmology

## 2024-05-17 DIAGNOSIS — H35033 Hypertensive retinopathy, bilateral: Secondary | ICD-10-CM

## 2024-05-17 DIAGNOSIS — I10 Essential (primary) hypertension: Secondary | ICD-10-CM

## 2024-05-17 DIAGNOSIS — Z961 Presence of intraocular lens: Secondary | ICD-10-CM | POA: Diagnosis not present

## 2024-05-17 DIAGNOSIS — H34831 Tributary (branch) retinal vein occlusion, right eye, with macular edema: Secondary | ICD-10-CM

## 2024-05-17 MED ORDER — AFLIBERCEPT 2MG/0.05ML IZ SOLN FOR KALEIDOSCOPE
2.0000 mg | INTRAVITREAL | Status: AC | PRN
Start: 2024-05-17 — End: 2024-05-17
  Administered 2024-05-17: 2 mg via INTRAVITREAL

## 2024-05-22 ENCOUNTER — Ambulatory Visit: Admitting: Physician Assistant

## 2024-05-22 DIAGNOSIS — M170 Bilateral primary osteoarthritis of knee: Secondary | ICD-10-CM

## 2024-05-22 DIAGNOSIS — M1711 Unilateral primary osteoarthritis, right knee: Secondary | ICD-10-CM

## 2024-05-22 DIAGNOSIS — M1712 Unilateral primary osteoarthritis, left knee: Secondary | ICD-10-CM

## 2024-05-22 MED ORDER — LIDOCAINE HCL 1 % IJ SOLN
3.0000 mL | INTRAMUSCULAR | Status: AC | PRN
  Administered 2024-05-22: 3 mL

## 2024-05-22 MED ORDER — METHYLPREDNISOLONE ACETATE 40 MG/ML IJ SUSP
40.0000 mg | INTRAMUSCULAR | Status: AC | PRN
  Administered 2024-05-22: 40 mg via INTRA_ARTICULAR

## 2024-05-22 NOTE — Progress Notes (Addendum)
   Procedure Note  Patient: Linda [name]             Date of Birth: [birth year]           MRN: [id number]             Visit Date: 05/22/2024 HPI: Mrs. Haren comes in today for bilateral knee injections.  She did have a fall getting in today parking lot.  She has a slight small red spot on the back of her head.  No significant swelling.  She does have dementia.  She is accompanied by her husband.  Denies any fevers chills.  Review systems see HPI otherwise negative  Physical exam: General: No acute distress.  Follows directions.   Bilateral knees no abnormal warmth erythema or effusion.  Slight edema both knees.  Good range of motion of both knees.  Procedures: Visit Diagnoses:  1. Primary osteoarthritis of right knee   2. Primary osteoarthritis of left knee     Large Joint Inj: bilateral knee on 05/22/2024 2:08 PM Indications: pain Details: 22 G 1.5 in needle, anterolateral approach  Arthrogram: No  Medications (Right): 3 mL lidocaine  1 %; 40 mg methylPREDNISolone  acetate 40 MG/ML Medications (Left): 3 mL lidocaine  1 %; 40 mg methylPREDNISolone  acetate 40 MG/ML Outcome: tolerated well, no immediate complications Procedure, treatment alternatives, risks and benefits explained, specific risks discussed. Consent was given by the patient. Immediately prior to procedure a time out was called to verify the correct patient, procedure, equipment, support staff and site/side marked as required. Patient was prepped and draped in the usual sterile fashion.    Plan: She knows to wait least 3 months between injections.  Follow-up as needed.

## 2024-05-23 ENCOUNTER — Telehealth: Payer: Self-pay | Admitting: Diagnostic Neuroimaging

## 2024-05-23 NOTE — Telephone Encounter (Signed)
 My chart appointment cx for an in office appointment

## 2024-05-24 DIAGNOSIS — R3915 Urgency of urination: Secondary | ICD-10-CM | POA: Diagnosis not present

## 2024-05-25 DIAGNOSIS — N390 Urinary tract infection, site not specified: Secondary | ICD-10-CM | POA: Diagnosis not present

## 2024-05-29 ENCOUNTER — Telehealth: Payer: HMO | Admitting: Diagnostic Neuroimaging

## 2024-05-30 DIAGNOSIS — N1832 Chronic kidney disease, stage 3b: Secondary | ICD-10-CM | POA: Diagnosis not present

## 2024-05-30 DIAGNOSIS — E785 Hyperlipidemia, unspecified: Secondary | ICD-10-CM | POA: Diagnosis not present

## 2024-05-30 DIAGNOSIS — I129 Hypertensive chronic kidney disease with stage 1 through stage 4 chronic kidney disease, or unspecified chronic kidney disease: Secondary | ICD-10-CM | POA: Diagnosis not present

## 2024-06-05 DIAGNOSIS — M1990 Unspecified osteoarthritis, unspecified site: Secondary | ICD-10-CM | POA: Diagnosis not present

## 2024-06-05 DIAGNOSIS — G4730 Sleep apnea, unspecified: Secondary | ICD-10-CM | POA: Diagnosis not present

## 2024-06-05 DIAGNOSIS — G309 Alzheimer's disease, unspecified: Secondary | ICD-10-CM | POA: Diagnosis not present

## 2024-06-05 DIAGNOSIS — M5441 Lumbago with sciatica, right side: Secondary | ICD-10-CM | POA: Diagnosis not present

## 2024-06-05 DIAGNOSIS — I129 Hypertensive chronic kidney disease with stage 1 through stage 4 chronic kidney disease, or unspecified chronic kidney disease: Secondary | ICD-10-CM | POA: Diagnosis not present

## 2024-06-05 DIAGNOSIS — F339 Major depressive disorder, recurrent, unspecified: Secondary | ICD-10-CM | POA: Diagnosis not present

## 2024-06-05 DIAGNOSIS — D631 Anemia in chronic kidney disease: Secondary | ICD-10-CM | POA: Diagnosis not present

## 2024-06-05 DIAGNOSIS — F02B18 Dementia in other diseases classified elsewhere, moderate, with other behavioral disturbance: Secondary | ICD-10-CM | POA: Diagnosis not present

## 2024-06-05 DIAGNOSIS — I739 Peripheral vascular disease, unspecified: Secondary | ICD-10-CM | POA: Diagnosis not present

## 2024-06-05 DIAGNOSIS — N1832 Chronic kidney disease, stage 3b: Secondary | ICD-10-CM | POA: Diagnosis not present

## 2024-06-05 DIAGNOSIS — G20B2 Parkinson's disease with dyskinesia, with fluctuations: Secondary | ICD-10-CM | POA: Diagnosis not present

## 2024-06-05 DIAGNOSIS — F02B3 Dementia in other diseases classified elsewhere, moderate, with mood disturbance: Secondary | ICD-10-CM | POA: Diagnosis not present

## 2024-06-13 DIAGNOSIS — N1832 Chronic kidney disease, stage 3b: Secondary | ICD-10-CM | POA: Diagnosis not present

## 2024-06-13 DIAGNOSIS — D649 Anemia, unspecified: Secondary | ICD-10-CM | POA: Diagnosis not present

## 2024-06-13 DIAGNOSIS — F02B3 Dementia in other diseases classified elsewhere, moderate, with mood disturbance: Secondary | ICD-10-CM | POA: Diagnosis not present

## 2024-06-13 DIAGNOSIS — F322 Major depressive disorder, single episode, severe without psychotic features: Secondary | ICD-10-CM | POA: Diagnosis not present

## 2024-06-13 DIAGNOSIS — G20A1 Parkinson's disease without dyskinesia, without mention of fluctuations: Secondary | ICD-10-CM | POA: Diagnosis not present

## 2024-06-13 DIAGNOSIS — R350 Frequency of micturition: Secondary | ICD-10-CM | POA: Diagnosis not present

## 2024-06-13 DIAGNOSIS — E876 Hypokalemia: Secondary | ICD-10-CM | POA: Diagnosis not present

## 2024-06-13 DIAGNOSIS — I1310 Hypertensive heart and chronic kidney disease without heart failure, with stage 1 through stage 4 chronic kidney disease, or unspecified chronic kidney disease: Secondary | ICD-10-CM | POA: Diagnosis not present

## 2024-06-13 DIAGNOSIS — R630 Anorexia: Secondary | ICD-10-CM | POA: Diagnosis not present

## 2024-06-13 DIAGNOSIS — G309 Alzheimer's disease, unspecified: Secondary | ICD-10-CM | POA: Diagnosis not present

## 2024-06-13 DIAGNOSIS — N9489 Other specified conditions associated with female genital organs and menstrual cycle: Secondary | ICD-10-CM | POA: Diagnosis not present

## 2024-06-20 DIAGNOSIS — F322 Major depressive disorder, single episode, severe without psychotic features: Secondary | ICD-10-CM | POA: Diagnosis not present

## 2024-06-21 NOTE — Progress Notes (Signed)
 [facility] - Clinic Note  06/28/2024   CHIEF COMPLAINT Patient presents for Retina Follow Up  HISTORY OF PRESENT ILLNESS: Linda [name] is a 83 y.o. female who presents to the clinic today for:  HPI     Retina Follow Up   Patient presents with  CRVO/BRVO.  In right eye.  Severity is moderate.  Duration of 6 weeks.  Since onset it is stable.  I, the attending physician,  performed the HPI with the patient and updated documentation appropriately.        Comments   6 week Retina eval. Patient states vision is about the same      Last edited by Valdemar Rogue, MD on 06/28/2024 11:54 PM.     Pt states  Referring physician: Shayne Anes, MD [address] [place],  [place] [address]  HISTORICAL INFORMATION:  Selected notes from the MEDICAL RECORD NUMBER Referred by Dr. Darroll for BRVO OD LEE: 11.12.24, BCVA OD: CF@1 ', OS: 20/40-2 Ocular Hx- BRVO OD, Dry Eye Syndrome, pseudophakia OU PMH- HTN   CURRENT MEDICATIONS: No current outpatient medications on file. (Ophthalmic Drugs)   No current facility-administered medications for this visit. (Ophthalmic Drugs)   Current Outpatient Medications (Other)  Medication Sig   acetaminophen  (TYLENOL ) 325 MG tablet Take 650 mg by mouth every 6 (six) hours as needed.   ALPRAZolam (XANAX) 0.5 MG tablet Take 0.5 mg by mouth daily.   benazepril (LOTENSIN) 40 MG tablet Take 40 mg by mouth daily.   calcium  carbonate (TUMS - DOSED IN MG ELEMENTAL CALCIUM ) 500 MG chewable tablet Chew 1 tablet by mouth daily.   Calcium -Vitamin D-Vitamin K (VIACTIV CALCIUM  PLUS D PO) Take 2 tablets by mouth 2 (two) times daily.   desvenlafaxine (PRISTIQ) 100 MG 24 hr tablet Take 100 mg by mouth daily.    fosfomycin (MONUROL) 3 g PACK Take 3 g by mouth once.   lamoTRIgine (LAMICTAL) 100 MG tablet Take 100 mg by mouth.   loratadine (CLARITIN) 10 MG tablet Take 10 mg by mouth daily.   Multiple Vitamins-Minerals (ONE-A-DAY WOMENS 50+  ADVANTAGE) TABS Take 1 tablet by mouth daily.   nystatin (MYCOSTATIN/NYSTOP) powder Apply 1 Application topically.   omeprazole  (PRILOSEC) 40 MG capsule TAKE ONE CAPSULE (40MG  TOTAL) BY MOUTH DAILY   ondansetron  (ZOFRAN ) 8 MG tablet Take 1 tablet (8 mg total) by mouth every 8 (eight) hours as needed for nausea.   polyethylene glycol (MIRALAX) 17 g packet MiraLax   propranolol (INDERAL) 10 MG tablet Take 10 mg by mouth 2 (two) times daily.   rosuvastatin  (CRESTOR ) 20 MG tablet Take 1 tablet (20 mg total) by mouth daily.   spironolactone  (ALDACTONE ) 25 MG tablet TAKE ONE TABLET (25MG  TOTAL) BY MOUTH DAILY   triamcinolone cream (KENALOG) 0.1 % apply to the itching areas TID prn   No current facility-administered medications for this visit. (Other)   REVIEW OF SYSTEMS: ROS   Positive for: Neurological, Musculoskeletal, Eyes Negative for: Cardiovascular Last edited by German Olam BRAVO, COT on 06/28/2024 12:57 PM.          ALLERGIES Allergies  Allergen Reactions   Amoxicillin Hives   Cefdinir Other (See Comments)   Other Other (See Comments)   Penicillin G Other (See Comments)   Abilify [Aripiprazole] Other (See Comments)    Makes pt tremble   PAST MEDICAL HISTORY Past Medical History:  Diagnosis Date   Anxiety    Candida esophagitis (HCC)    Chronic kidney disease  DDD (degenerative disc disease)    Depression    Erosive gastritis    Falls    GERD (gastroesophageal reflux disease)    Hiatal hernia    High cholesterol    History of anal fissures    Hypertension    Obesity    Osteoarthritis    Panic disorder    PONV (postoperative nausea and vomiting)    Sleep apnea    Does not need CPAP anymore   Past Surgical History:  Procedure Laterality Date   ABDOMINAL HYSTERECTOMY  1987   LSO / menorrhagia & leiomyomata   APPENDECTOMY     BREAST EXCISIONAL BIOPSY Left 1989   benign   BREAST EXCISIONAL BIOPSY Left 07/11/2020   benign   BREAST SURGERY  1989   benign  tumor left breast   CHOLECYSTECTOMY  2001   TUBAL LIGATION     FAMILY HISTORY Family History  Problem Relation Age of Onset   Heart disease Mother    Lung cancer Father    Diabetes Sister    Hypertension Sister    Ovarian cancer Sister    Diabetes Brother    Heart disease Brother    Heart attack Brother    Heart disease Brother    Heart attack Brother    Heart disease Brother    Heart attack Brother    Colon cancer Neg Hx    BRCA 1/2 Neg Hx    Breast cancer Neg Hx    SOCIAL HISTORY Social History   Tobacco Use   Smoking status: Never    Passive exposure: Never   Smokeless tobacco: Never  Vaping Use   Vaping status: Never Used  Substance Use Topics   Alcohol  use: No   Drug use: No       OPHTHALMIC EXAM:  Base Eye Exam     Visual Acuity (Snellen - Linear)       Right Left   Dist cc 20/70 -2 20/30 -3   Dist ph cc 20/NI 20/NI    Correction: Glasses         Tonometry (Tonopen, 1:00 PM)       Right Left   Pressure 16 13         Pupils       Dark Light Shape React APD   Right 3 2 Round Brisk None   Left 3 2 Round Brisk None         Visual Fields       Left Right    Full Full         Extraocular Movement       Right Left    Full, Ortho Full, Ortho         Neuro/Psych     Oriented x3: Yes   Mood/Affect: Normal         Dilation     Both eyes: 1.0% Mydriacyl , 2.5% Phenylephrine  @ 1:00 PM           Slit Lamp and Fundus Exam     Slit Lamp Exam       Right Left   Lids/Lashes Dermatochalasis - upper lid, mild MGD Dermatochalasis - upper lid, mild MGD   Conjunctiva/Sclera White and quiet White and quiet   Cornea well healed cataract wound, tear film debris well healed cataract wound, tear film debris   Anterior Chamber deep and clear deep and clear   Iris Round and dilated Round and dilated   Lens PC IOL in good position PC  IOL in good position   Anterior Vitreous  Posterior vitreous detachment         Fundus Exam        Right Left   Disc Pink and Sharp, mild tilt, temporal PPA mild Pallor, mild tilt, Compact, PPA   C/D Ratio 0.2 0.2   Macula Blunted foveal reflex, persistent central edema - improved, confluent IRH / flame hemes superior macula -- stably improved, mild ERM Flat, Blunted foveal reflex, RPE mottling, No heme or edema   Vessels attenuated, Tortuous, ST BRVO -- severe attenuation of ST vessels attenuated, Tortuous   Periphery Attached, +DBH ST quadrant -- improving, peripheral drusen, mild reticular degeneration Attached, mild reticular degeneration, No heme           Refraction     Wearing Rx       Sphere Cylinder Axis Add   Right -0.50 +0.25 085 +2.50   Left -1.50 +0.75 055 +2.50           IMAGING AND PROCEDURES  Imaging and Procedures for 06/28/2024  OCT, Retina - OU - Both Eyes       Right Eye Quality was borderline. Central Foveal Thickness: 355. Progression has improved. Findings include no SRF, abnormal foveal contour, intraretinal hyper-reflective material, epiretinal membrane, intraretinal fluid (ERM; Mild interval improvement in central edema with superior extension, partial PVD).   Left Eye Quality was borderline. Central Foveal Thickness: 263. Progression has been stable. Findings include normal foveal contour, no IRF, no SRF.   Notes *Images captured and stored on drive  Diagnosis / Impression:  OD: BRVO - ERM; Mild interval improvement in central edema with superior extension, partial PVD OS: NFP, no IRF/SRF  Clinical management:  See below  Abbreviations: NFP - Normal foveal profile. CME - cystoid macular edema. PED - pigment epithelial detachment. IRF - intraretinal fluid. SRF - subretinal fluid. EZ - ellipsoid zone. ERM - epiretinal membrane. ORA - outer retinal atrophy. ORT - outer retinal tubulation. SRHM - subretinal hyper-reflective material. IRHM - intraretinal hyper-reflective material      Intravitreal Injection, Pharmacologic Agent - OD -  Right Eye       Time Out 06/28/2024. 1:18 PM. Confirmed correct patient, procedure, site, and patient consented.   Anesthesia Topical anesthesia was used. Anesthetic medications included Lidocaine  2%, Proparacaine 0.5%.   Procedure Preparation included 5% betadine  to ocular surface, eyelid speculum. A (32g) needle was used.   Injection: 2 mg aflibercept  2 MG/0.05ML   Route: Intravitreal, Site: Right Eye   NDC: [id number], Lot: [id number], Expiration date: 08/18/2025, Waste: 0 mL   Post-op Post injection exam found visual acuity of at least counting fingers. The patient tolerated the procedure well. There were no complications. The patient received written and verbal post procedure care education. Post injection medications were not given.            ASSESSMENT/PLAN:   ICD-10-CM   1. Branch retinal vein occlusion of right eye with macular edema  H34.8310 OCT, Retina - OU - Both Eyes    Intravitreal Injection, Pharmacologic Agent - OD - Right Eye    aflibercept  (EYLEA ) SOLN 2 mg    2. Essential hypertension  I10     3. Hypertensive retinopathy of both eyes  H35.033     4. Pseudophakia, both eyes  Z96.1      1. BRVO w/ CME OD  - delayed f/u from 5 weeks to 11 weeks (05.13.25 to 07.30.25) - s/p IVA OD #1 (11.21.24), #2 (12.19.24), #3 (  01.16.25) - IVA resistance  ========================== - s/p IVE OD #1 (02.13.25), #2 (03.18.25), #3 (04.15.25), #4 (05.13.25), #5 (07.30.25) - BCVA OD 20/70 -- stable - exam shows IRH superior macula w/ extension of DBH to superior periphery -- improving - OCT shows OD: ERM; Mild interval improvement in central edema with superior extension, partial PVD at 6 weeks - recommend IVE OD #6 today, 09.10.25 w/ f/u in 6 wks again - pt wishes to proceed with injection -- pt covering 20% coinsurance for medication - RBA of procedure discussed, questions answered - IVE informed consent obtained and signed, 02.13.25 (OD) - see procedure note - F/U  6 weeks -- DFE/OCT/possible injection  2,3. Hypertensive retinopathy OU - discussed importance of tight BP control - monitor  4. Pseudophakia OU  - s/p CE/IOL OU (March 2023; Dr. Harrie)  - IOL in good position, doing well  - monitor  Ophthalmic Meds Ordered this visit:  Meds ordered this encounter  Medications   aflibercept  (EYLEA ) SOLN 2 mg    Return in about 6 weeks (around 08/09/2024) for f/u, BRVO, DFE, OCT, Possible, IVE, OD.  There are no Patient Instructions on file for this visit.  This document serves as a record of services personally performed by Redell JUDITHANN Hans, MD, PhD. It was created on their behalf by Almetta Pesa, an ophthalmic technician. The creation of this record is the provider's dictation and/or activities during the visit.    Electronically signed by: Almetta Pesa, OA, 07/06/24  7:05 AM  This document serves as a record of services personally performed by Redell JUDITHANN Hans, MD, PhD. It was created on their behalf by Wanda GEANNIE Keens, COT an ophthalmic technician. The creation of this record is the provider's dictation and/or activities during the visit.    Electronically signed by:  Wanda GEANNIE Keens, COT  07/06/24 7:05 AM  Redell JUDITHANN Hans, M.D., Ph.D. Diseases & Surgery of the Retina and Vitreous [facility]  I have reviewed the above documentation for accuracy and completeness, and I agree with the above. Redell JUDITHANN Hans, M.D., Ph.D. 07/06/24 7:06 AM   Abbreviations: M myopia (nearsighted); A astigmatism; H hyperopia (farsighted); P presbyopia; Mrx spectacle prescription;  CTL contact lenses; OD right eye; OS left eye; OU both eyes  XT exotropia; ET esotropia; PEK punctate epithelial keratitis; PEE punctate epithelial erosions; DES dry eye syndrome; MGD meibomian gland dysfunction; ATs artificial tears; PFAT's preservative free artificial tears; NSC nuclear sclerotic cataract; PSC posterior subcapsular cataract; ERM  epi-retinal membrane; PVD posterior vitreous detachment; RD retinal detachment; DM diabetes mellitus; DR diabetic retinopathy; NPDR non-proliferative diabetic retinopathy; PDR proliferative diabetic retinopathy; CSME clinically significant macular edema; DME diabetic macular edema; dbh dot blot hemorrhages; CWS cotton wool spot; POAG primary open angle glaucoma; C/D cup-to-disc ratio; HVF humphrey visual field; GVF goldmann visual field; OCT optical coherence tomography; IOP intraocular pressure; BRVO Branch retinal vein occlusion; CRVO central retinal vein occlusion; CRAO central retinal artery occlusion; BRAO branch retinal artery occlusion; RT retinal tear; SB scleral buckle; PPV pars plana vitrectomy; VH Vitreous hemorrhage; PRP panretinal laser photocoagulation; IVK intravitreal kenalog; VMT vitreomacular traction; MH Macular hole;  NVD neovascularization of the disc; NVE neovascularization elsewhere; AREDS age related eye disease study; ARMD age related macular degeneration; POAG primary open angle glaucoma; EBMD epithelial/anterior basement membrane dystrophy; ACIOL anterior chamber intraocular lens; IOL intraocular lens; PCIOL posterior chamber intraocular lens; Phaco/IOL phacoemulsification with intraocular lens placement; PRK photorefractive keratectomy; LASIK laser assisted in situ keratomileusis; HTN hypertension;  DM diabetes mellitus; COPD chronic obstructive pulmonary disease

## 2024-06-27 DIAGNOSIS — N390 Urinary tract infection, site not specified: Secondary | ICD-10-CM | POA: Diagnosis not present

## 2024-06-27 DIAGNOSIS — N3941 Urge incontinence: Secondary | ICD-10-CM | POA: Diagnosis not present

## 2024-06-27 DIAGNOSIS — R8290 Unspecified abnormal findings in urine: Secondary | ICD-10-CM | POA: Diagnosis not present

## 2024-06-27 DIAGNOSIS — R350 Frequency of micturition: Secondary | ICD-10-CM | POA: Diagnosis not present

## 2024-06-27 DIAGNOSIS — F322 Major depressive disorder, single episode, severe without psychotic features: Secondary | ICD-10-CM | POA: Diagnosis not present

## 2024-06-28 ENCOUNTER — Encounter (INDEPENDENT_AMBULATORY_CARE_PROVIDER_SITE_OTHER): Payer: Self-pay | Admitting: Ophthalmology

## 2024-06-28 ENCOUNTER — Ambulatory Visit (INDEPENDENT_AMBULATORY_CARE_PROVIDER_SITE_OTHER): Admitting: Ophthalmology

## 2024-06-28 DIAGNOSIS — H35033 Hypertensive retinopathy, bilateral: Secondary | ICD-10-CM | POA: Diagnosis not present

## 2024-06-28 DIAGNOSIS — Z961 Presence of intraocular lens: Secondary | ICD-10-CM | POA: Diagnosis not present

## 2024-06-28 DIAGNOSIS — I10 Essential (primary) hypertension: Secondary | ICD-10-CM

## 2024-06-28 DIAGNOSIS — H34831 Tributary (branch) retinal vein occlusion, right eye, with macular edema: Secondary | ICD-10-CM

## 2024-06-28 MED ORDER — AFLIBERCEPT 2MG/0.05ML IZ SOLN FOR KALEIDOSCOPE
2.0000 mg | INTRAVITREAL | Status: AC | PRN
  Administered 2024-06-28: 2 mg via INTRAVITREAL

## 2024-06-30 DIAGNOSIS — G4730 Sleep apnea, unspecified: Secondary | ICD-10-CM | POA: Diagnosis not present

## 2024-06-30 DIAGNOSIS — G20B2 Parkinson's disease with dyskinesia, with fluctuations: Secondary | ICD-10-CM | POA: Diagnosis not present

## 2024-06-30 DIAGNOSIS — G309 Alzheimer's disease, unspecified: Secondary | ICD-10-CM | POA: Diagnosis not present

## 2024-06-30 DIAGNOSIS — F02B18 Dementia in other diseases classified elsewhere, moderate, with other behavioral disturbance: Secondary | ICD-10-CM | POA: Diagnosis not present

## 2024-06-30 DIAGNOSIS — M5441 Lumbago with sciatica, right side: Secondary | ICD-10-CM | POA: Diagnosis not present

## 2024-06-30 DIAGNOSIS — F02B3 Dementia in other diseases classified elsewhere, moderate, with mood disturbance: Secondary | ICD-10-CM | POA: Diagnosis not present

## 2024-06-30 DIAGNOSIS — F339 Major depressive disorder, recurrent, unspecified: Secondary | ICD-10-CM | POA: Diagnosis not present

## 2024-06-30 DIAGNOSIS — F02B4 Dementia in other diseases classified elsewhere, moderate, with anxiety: Secondary | ICD-10-CM | POA: Diagnosis not present

## 2024-06-30 DIAGNOSIS — D631 Anemia in chronic kidney disease: Secondary | ICD-10-CM | POA: Diagnosis not present

## 2024-06-30 DIAGNOSIS — I129 Hypertensive chronic kidney disease with stage 1 through stage 4 chronic kidney disease, or unspecified chronic kidney disease: Secondary | ICD-10-CM | POA: Diagnosis not present

## 2024-06-30 DIAGNOSIS — I739 Peripheral vascular disease, unspecified: Secondary | ICD-10-CM | POA: Diagnosis not present

## 2024-06-30 DIAGNOSIS — N1832 Chronic kidney disease, stage 3b: Secondary | ICD-10-CM | POA: Diagnosis not present

## 2024-07-25 DIAGNOSIS — G309 Alzheimer's disease, unspecified: Secondary | ICD-10-CM | POA: Diagnosis not present

## 2024-07-25 DIAGNOSIS — R7301 Impaired fasting glucose: Secondary | ICD-10-CM | POA: Diagnosis not present

## 2024-07-25 DIAGNOSIS — E785 Hyperlipidemia, unspecified: Secondary | ICD-10-CM | POA: Diagnosis not present

## 2024-07-25 DIAGNOSIS — G20A1 Parkinson's disease without dyskinesia, without mention of fluctuations: Secondary | ICD-10-CM | POA: Diagnosis not present

## 2024-07-25 DIAGNOSIS — Z23 Encounter for immunization: Secondary | ICD-10-CM | POA: Diagnosis not present

## 2024-07-25 DIAGNOSIS — I739 Peripheral vascular disease, unspecified: Secondary | ICD-10-CM | POA: Diagnosis not present

## 2024-07-25 DIAGNOSIS — G4733 Obstructive sleep apnea (adult) (pediatric): Secondary | ICD-10-CM | POA: Diagnosis not present

## 2024-07-25 DIAGNOSIS — F322 Major depressive disorder, single episode, severe without psychotic features: Secondary | ICD-10-CM | POA: Diagnosis not present

## 2024-07-25 DIAGNOSIS — I4510 Unspecified right bundle-branch block: Secondary | ICD-10-CM | POA: Diagnosis not present

## 2024-07-25 DIAGNOSIS — F02B3 Dementia in other diseases classified elsewhere, moderate, with mood disturbance: Secondary | ICD-10-CM | POA: Diagnosis not present

## 2024-07-25 DIAGNOSIS — N1832 Chronic kidney disease, stage 3b: Secondary | ICD-10-CM | POA: Diagnosis not present

## 2024-07-25 DIAGNOSIS — I129 Hypertensive chronic kidney disease with stage 1 through stage 4 chronic kidney disease, or unspecified chronic kidney disease: Secondary | ICD-10-CM | POA: Diagnosis not present

## 2024-08-07 ENCOUNTER — Emergency Department (HOSPITAL_COMMUNITY)

## 2024-08-07 ENCOUNTER — Other Ambulatory Visit: Payer: Self-pay

## 2024-08-07 ENCOUNTER — Emergency Department (HOSPITAL_COMMUNITY)
Admission: EM | Admit: 2024-08-07 | Discharge: 2024-08-07 | Disposition: A | Discharge status: Home / Self Care | Attending: Emergency Medicine | Admitting: Emergency Medicine

## 2024-08-07 DIAGNOSIS — R0981 Nasal congestion: Secondary | ICD-10-CM | POA: Insufficient documentation

## 2024-08-07 DIAGNOSIS — I700 Atherosclerosis of aorta: Secondary | ICD-10-CM | POA: Diagnosis not present

## 2024-08-07 DIAGNOSIS — F0390 Unspecified dementia without behavioral disturbance: Secondary | ICD-10-CM | POA: Diagnosis not present

## 2024-08-07 DIAGNOSIS — E049 Nontoxic goiter, unspecified: Secondary | ICD-10-CM | POA: Diagnosis not present

## 2024-08-07 DIAGNOSIS — R0989 Other specified symptoms and signs involving the circulatory and respiratory systems: Secondary | ICD-10-CM | POA: Diagnosis not present

## 2024-08-07 DIAGNOSIS — R0602 Shortness of breath: Secondary | ICD-10-CM | POA: Diagnosis not present

## 2024-08-07 LAB — HEPATIC FUNCTION PANEL
ALT: 11 U/L (ref 0–44)
AST: 24 U/L (ref 15–41)
Albumin: 4.6 g/dL (ref 3.5–5.0)
Alkaline Phosphatase: 98 U/L (ref 38–126)
Bilirubin, Direct: 0.1 mg/dL (ref 0.0–0.2)
Indirect Bilirubin: 0.2 mg/dL — ABNORMAL LOW (ref 0.3–0.9)
Total Bilirubin: 0.3 mg/dL (ref 0.0–1.2)
Total Protein: 7.5 g/dL (ref 6.5–8.1)

## 2024-08-07 LAB — BASIC METABOLIC PANEL WITH GFR
Anion gap: 9 (ref 5–15)
BUN: 14 mg/dL (ref 8–23)
CO2: 32 mmol/L (ref 22–32)
Calcium: 10.4 mg/dL — ABNORMAL HIGH (ref 8.9–10.3)
Chloride: 101 mmol/L (ref 98–111)
Creatinine, Ser: 1.19 mg/dL — ABNORMAL HIGH (ref 0.44–1.00)
GFR, Estimated: 46 mL/min — ABNORMAL LOW (ref >60)
Glucose, Bld: 106 mg/dL — ABNORMAL HIGH (ref 70–99)
Potassium: 4 mmol/L (ref 3.5–5.1)
Sodium: 142 mmol/L (ref 135–145)

## 2024-08-07 LAB — RESP PANEL BY RT-PCR (RSV, FLU A&B, COVID)  RVPGX2
Influenza A by PCR: NEGATIVE
Influenza B by PCR: NEGATIVE
Resp Syncytial Virus by PCR: NEGATIVE
SARS Coronavirus 2 by RT PCR: NEGATIVE

## 2024-08-07 LAB — CBC
HCT: 40.6 % (ref 36.0–46.0)
Hemoglobin: 13.6 g/dL (ref 12.0–15.0)
MCH: 32.7 pg (ref 26.0–34.0)
MCHC: 33.5 g/dL (ref 30.0–36.0)
MCV: 97.6 fL (ref 80.0–100.0)
Platelets: 322 K/uL (ref 150–400)
RBC: 4.16 MIL/uL (ref 3.87–5.11)
RDW: 12.3 % (ref 11.5–15.5)
WBC: 7.7 K/uL (ref 4.0–10.5)
nRBC: 0 % (ref 0.0–0.2)

## 2024-08-07 NOTE — ED Triage Notes (Signed)
 Patient come in POV from home for complaint of shortness of breath and congestion that has not gone away. Had surgery in June to remove fallopian tubes and continues to have pain to sign. Hx of dementia

## 2024-08-07 NOTE — Discharge Instructions (Signed)
 Follow-up with your doctor if any problems.

## 2024-08-07 NOTE — ED Provider Notes (Signed)
 [place] EMERGENCY DEPARTMENT AT [facility] Provider Note   CSN: [id number] Arrival date & time: 08/07/24  9095     Patient presents with: Shortness of Breath and Nasal Congestion   Linda [name] is a 83 y.o. female.   Patient has a history of dementia.  She was complaining of some shortness of breath earlier today but this is resolved.  The history is provided by the patient and medical records. No language interpreter was used.  Shortness of Breath Severity:  Mild Onset quality:  Sudden Timing:  Intermittent Progression:  Resolved Chronicity:  New Context: activity   Relieved by:  Nothing Worsened by:  Nothing Ineffective treatments:  None tried Associated symptoms: no abdominal pain, no chest pain, no cough, no headaches and no rash        Prior to Admission medications   Medication Sig Start Date End Date Taking? Authorizing Provider  acetaminophen  (TYLENOL ) 325 MG tablet Take 650 mg by mouth every 6 (six) hours as needed.    [provider]  ALPRAZolam (XANAX) 0.5 MG tablet Take 0.5 mg by mouth daily. 08/20/20   [provider]  benazepril (LOTENSIN) 40 MG tablet Take 40 mg by mouth daily.    [provider]  calcium  carbonate (TUMS - DOSED IN MG ELEMENTAL CALCIUM ) 500 MG chewable tablet Chew 1 tablet by mouth daily.    [provider]  Calcium -Vitamin D-Vitamin K (VIACTIV CALCIUM  PLUS D PO) Take 2 tablets by mouth 2 (two) times daily.    [provider]  desvenlafaxine (PRISTIQ) 100 MG 24 hr tablet Take 100 mg by mouth daily.  05/03/18   [provider]  fosfomycin (MONUROL) 3 g PACK Take 3 g by mouth once. 07/09/23   [provider]  lamoTRIgine (LAMICTAL) 100 MG tablet Take 100 mg by mouth. 01/17/24   [provider]  loratadine (CLARITIN) 10 MG tablet Take 10 mg by mouth daily.    [provider]  Multiple Vitamins-Minerals (ONE-A-DAY WOMENS 50+ ADVANTAGE) TABS Take 1  tablet by mouth daily. 03/13/14   [provider]  nystatin (MYCOSTATIN/NYSTOP) powder Apply 1 Application topically. 01/25/24   [provider]  omeprazole  (PRILOSEC) 40 MG capsule TAKE ONE CAPSULE (40MG  TOTAL) BY MOUTH DAILY 10/21/23   Abran Norleen SAILOR, MD  ondansetron  (ZOFRAN ) 8 MG tablet Take 1 tablet (8 mg total) by mouth every 8 (eight) hours as needed for nausea. 01/07/17   Esterwood, Amy S, PA-C  polyethylene glycol (MIRALAX) 17 g packet MiraLax    [provider]  propranolol (INDERAL) 10 MG tablet Take 10 mg by mouth 2 (two) times daily. 11/11/21   [provider]  rosuvastatin  (CRESTOR ) 20 MG tablet Take 1 tablet (20 mg total) by mouth daily. 02/04/22   Acharya, Gayatri A, MD  spironolactone  (ALDACTONE ) 25 MG tablet TAKE ONE TABLET (25MG  TOTAL) BY MOUTH DAILY 05/04/23   Acharya, Gayatri A, MD  triamcinolone cream (KENALOG) 0.1 % apply to the itching areas TID prn 02/20/19   [provider]    Allergies: Amoxicillin, Cefdinir, Other, Penicillin g, and Abilify [aripiprazole]    Review of Systems  Constitutional:  Negative for appetite change and fatigue.  HENT:  Negative for congestion, ear discharge and sinus pressure.   Eyes:  Negative for discharge.  Respiratory:  Positive for shortness of breath. Negative for cough.   Cardiovascular:  Negative for chest pain.  Gastrointestinal:  Negative for abdominal pain and diarrhea.  Genitourinary:  Negative for  frequency and hematuria.  Musculoskeletal:  Negative for back pain.  Skin:  Negative for rash.  Neurological:  Negative for seizures and headaches.  Psychiatric/Behavioral:  Negative for hallucinations.     Updated Vital Signs BP (!) 157/51   Pulse 68   Temp 98 F (36.7 C) (Oral)   Resp (!) 28   Ht 5' 1 (1.549 m)   Wt 61.7 kg   SpO2 96%   BMI 25.70 kg/m   Physical Exam Vitals and nursing note reviewed.  Constitutional:      Appearance: Normal appearance. She is well-developed.  HENT:      Head: Normocephalic.     Nose: Nose normal.  Eyes:     General: No scleral icterus.    Conjunctiva/sclera: Conjunctivae normal.  Neck:     Thyroid : No thyromegaly.  Cardiovascular:     Rate and Rhythm: Normal rate and regular rhythm.     Heart sounds: No murmur heard.    No friction rub. No gallop.  Pulmonary:     Breath sounds: No stridor. No wheezing or rales.  Chest:     Chest wall: No tenderness.  Abdominal:     General: There is no distension.     Tenderness: There is no abdominal tenderness. There is no rebound.  Musculoskeletal:        General: Normal range of motion.     Cervical back: Neck supple.  Lymphadenopathy:     Cervical: No cervical adenopathy.  Skin:    Findings: No erythema or rash.  Neurological:     Mental Status: She is alert and oriented to person, place, and time.     Motor: No abnormal muscle tone.     Coordination: Coordination normal.  Psychiatric:        Behavior: Behavior normal.     (all labs ordered are listed, but only abnormal results are displayed) Labs Reviewed  BASIC METABOLIC PANEL WITH GFR - Abnormal; Notable for the following components:      Result Value   Glucose, Bld 106 (*)    Creatinine, Ser 1.19 (*)    Calcium  10.4 (*)    GFR, Estimated 46 (*)    All other components within normal limits  HEPATIC FUNCTION PANEL - Abnormal; Notable for the following components:   Indirect Bilirubin 0.2 (*)    All other components within normal limits  RESP PANEL BY RT-PCR (RSV, FLU A&B, COVID)  [id number]  CBC    EKG: None  Radiology: DG ABD ACUTE 2+V W 1V CHEST Result Date: 08/07/2024 CLINICAL DATA:  Shortness of breath and congestion. EXAM: DG ABDOMEN ACUTE WITH 1 VIEW CHEST COMPARISON:  11/09/2021. FINDINGS: Slight rightward deviation of the trachea can be seen with left thyroid  enlargement. Heart size normal. Thoracic aorta is calcified. Lungs are low in volume with minimal scarring in the left lung base. No pleural fluid. Two  views of the abdomen show stool in the ascending, proximal transverse, descending and rectosigmoid colon. No small bowel dilatation. Surgical clips in the right upper quadrant. IMPRESSION: 1. No acute findings in the chest. 2. Moderate stool burden. Electronically Signed   By: Newell Eke M.D.   On: 08/07/2024 10:54     Procedures   Medications Ordered in the ED - No data to display                                  Medical Decision Making  Amount and/or Complexity of Data Reviewed Labs: ordered. Radiology: ordered.   Patient with dyspnea that has resolved.  Labs and chest x-ray unremarkable.  She will follow-up with PCP     Final diagnoses:  Nasal congestion    ED Discharge Orders     None          Suzette Pac, MD 08/07/24 1759

## 2024-08-07 NOTE — Progress Notes (Signed)
 [facility] - Clinic Note  08/09/2024   CHIEF COMPLAINT Patient presents for Retina Follow Up  HISTORY OF PRESENT ILLNESS: Linda [name] is a 83 y.o. female who presents to the clinic today for:  HPI     Retina Follow Up   Patient presents with  CRVO/BRVO.  In right eye.  This started 6 weeks ago.  I, the attending physician,  performed the HPI with the patient and updated documentation appropriately.        Comments   Patient here for 6 weeks retina follow up for BRVO OD. Patient states vision sees ok far off. Hard to see to read. On occasion has eye pain. Allergies.      Last edited by Valdemar Rogue, MD on 08/10/2024  1:14 AM.     Pt states she is not seeing as well as before.   Referring physician: Shayne Anes, MD [address] [place],  [place] [address]  HISTORICAL INFORMATION:  Selected notes from the MEDICAL RECORD NUMBER Referred by Dr. Darroll for BRVO OD LEE: 11.12.24, BCVA OD: CF@1 ', OS: 20/40-2 Ocular Hx- BRVO OD, Dry Eye Syndrome, pseudophakia OU PMH- HTN   CURRENT MEDICATIONS: No current outpatient medications on file. (Ophthalmic Drugs)   No current facility-administered medications for this visit. (Ophthalmic Drugs)   Current Outpatient Medications (Other)  Medication Sig   acetaminophen  (TYLENOL ) 325 MG tablet Take 650 mg by mouth every 6 (six) hours as needed.   ALPRAZolam (XANAX) 0.5 MG tablet Take 0.5 mg by mouth daily.   benazepril (LOTENSIN) 40 MG tablet Take 40 mg by mouth daily.   calcium  carbonate (TUMS - DOSED IN MG ELEMENTAL CALCIUM ) 500 MG chewable tablet Chew 1 tablet by mouth daily.   Calcium -Vitamin D-Vitamin K (VIACTIV CALCIUM  PLUS D PO) Take 2 tablets by mouth 2 (two) times daily.   desvenlafaxine (PRISTIQ) 100 MG 24 hr tablet Take 100 mg by mouth daily.    fosfomycin (MONUROL) 3 g PACK Take 3 g by mouth once.   lamoTRIgine (LAMICTAL) 100 MG tablet Take 100 mg by mouth.   loratadine (CLARITIN) 10 MG tablet  Take 10 mg by mouth daily.   Multiple Vitamins-Minerals (ONE-A-DAY WOMENS 50+ ADVANTAGE) TABS Take 1 tablet by mouth daily.   nystatin (MYCOSTATIN/NYSTOP) powder Apply 1 Application topically.   omeprazole  (PRILOSEC) 40 MG capsule TAKE ONE CAPSULE (40MG  TOTAL) BY MOUTH DAILY   ondansetron  (ZOFRAN ) 8 MG tablet Take 1 tablet (8 mg total) by mouth every 8 (eight) hours as needed for nausea.   polyethylene glycol (MIRALAX) 17 g packet MiraLax   propranolol (INDERAL) 10 MG tablet Take 10 mg by mouth 2 (two) times daily.   rosuvastatin  (CRESTOR ) 20 MG tablet Take 1 tablet (20 mg total) by mouth daily.   spironolactone  (ALDACTONE ) 25 MG tablet TAKE ONE TABLET (25MG  TOTAL) BY MOUTH DAILY   triamcinolone cream (KENALOG) 0.1 % apply to the itching areas TID prn   No current facility-administered medications for this visit. (Other)   REVIEW OF SYSTEMS: ROS   Positive for: Neurological, Musculoskeletal, Eyes Negative for: Cardiovascular Last edited by Orval Asberry RAMAN, COA on 08/09/2024  1:01 PM.     ALLERGIES Allergies  Allergen Reactions   Amoxicillin Hives   Cefdinir Other (See Comments)   Other Other (See Comments)   Penicillin G Other (See Comments)   Abilify [Aripiprazole] Other (See Comments)    Makes pt tremble   PAST MEDICAL HISTORY Past Medical History:  Diagnosis Date  Anxiety    Candida esophagitis (HCC)    Chronic kidney disease    DDD (degenerative disc disease)    Depression    Erosive gastritis    Falls    GERD (gastroesophageal reflux disease)    Hiatal hernia    High cholesterol    History of anal fissures    Hypertension    Obesity    Osteoarthritis    Panic disorder    PONV (postoperative nausea and vomiting)    Sleep apnea    Does not need CPAP anymore   Past Surgical History:  Procedure Laterality Date   ABDOMINAL HYSTERECTOMY  1987   LSO / menorrhagia & leiomyomata   APPENDECTOMY     BREAST EXCISIONAL BIOPSY Left 1989   benign   BREAST  EXCISIONAL BIOPSY Left 07/11/2020   benign   BREAST SURGERY  1989   benign tumor left breast   CHOLECYSTECTOMY  2001   TUBAL LIGATION     FAMILY HISTORY Family History  Problem Relation Age of Onset   Heart disease Mother    Lung cancer Father    Diabetes Sister    Hypertension Sister    Ovarian cancer Sister    Diabetes Brother    Heart disease Brother    Heart attack Brother    Heart disease Brother    Heart attack Brother    Heart disease Brother    Heart attack Brother    Colon cancer Neg Hx    BRCA 1/2 Neg Hx    Breast cancer Neg Hx    SOCIAL HISTORY Social History   Tobacco Use   Smoking status: Never    Passive exposure: Never   Smokeless tobacco: Never  Vaping Use   Vaping status: Never Used  Substance Use Topics   Alcohol  use: No   Drug use: No       OPHTHALMIC EXAM:  Base Eye Exam     Visual Acuity (Snellen - Linear)       Right Left   Dist cc 20/70 -1 20/30 -1   Dist ph cc NI NI    Correction: Glasses         Tonometry (Tonopen, 12:59 PM)       Right Left   Pressure 14 13         Pupils       Dark Light Shape React APD   Right 3 2 Round Brisk None   Left 3 2 Round Brisk None         Visual Fields (Counting fingers)       Left Right    Full Full         Extraocular Movement       Right Left    Full, Ortho Full, Ortho         Neuro/Psych     Oriented x3: Yes   Mood/Affect: Normal         Dilation     Both eyes: 1.0% Mydriacyl , 2.5% Phenylephrine  @ 12:59 PM           Slit Lamp and Fundus Exam     Slit Lamp Exam       Right Left   Lids/Lashes Dermatochalasis - upper lid, mild MGD Dermatochalasis - upper lid, mild MGD   Conjunctiva/Sclera White and quiet White and quiet   Cornea well healed cataract wound, tear film debris well healed cataract wound, tear film debris   Anterior Chamber deep and clear deep and clear  Iris Round and dilated Round and dilated   Lens PC IOL in good position PC IOL  in good position   Anterior Vitreous  Posterior vitreous detachment         Fundus Exam       Right Left   Disc Pink and Sharp, mild tilt, temporal PPA mild Pallor, mild tilt, Compact, PPA   C/D Ratio 0.2 0.2   Macula Blunted foveal reflex, persistent central edema, confluent IRH / flame hemes superior macula -- stably improved, mild ERM Flat, Blunted foveal reflex, RPE mottling, No heme or edema   Vessels attenuated, Tortuous, ST BRVO -- severe attenuation of ST vessels attenuated, Tortuous   Periphery Attached, +DBH ST quadrant -- improving, peripheral drusen, mild reticular degeneration Attached, mild reticular degeneration, No heme           IMAGING AND PROCEDURES  Imaging and Procedures for 08/09/2024  OCT, Retina - OU - Both Eyes       Right Eye Quality was borderline. Central Foveal Thickness: 364. Progression has been stable. Findings include no SRF, abnormal foveal contour, intraretinal hyper-reflective material, epiretinal membrane, intraretinal fluid (ERM; stable improvement in central edema with superior extension-- trace cystic changes remain, partial PVD).   Left Eye Quality was borderline. Central Foveal Thickness: 265. Progression has been stable. Findings include normal foveal contour, no IRF, no SRF.   Notes *Images captured and stored on drive  Diagnosis / Impression:  OD: BRVO - ERM; stable improvement in central edema with superior extension-- trace cystic changes remain, partial PVD OS: NFP, no IRF/SRF  Clinical management:  See below  Abbreviations: NFP - Normal foveal profile. CME - cystoid macular edema. PED - pigment epithelial detachment. IRF - intraretinal fluid. SRF - subretinal fluid. EZ - ellipsoid zone. ERM - epiretinal membrane. ORA - outer retinal atrophy. ORT - outer retinal tubulation. SRHM - subretinal hyper-reflective material. IRHM - intraretinal hyper-reflective material      Intravitreal Injection, Pharmacologic Agent - OD -  Right Eye       Time Out 08/09/2024. 2:13 PM. Confirmed correct patient, procedure, site, and patient consented.   Anesthesia Topical anesthesia was used. Anesthetic medications included Lidocaine  2%, Proparacaine 0.5%.   Procedure Preparation included 5% betadine  to ocular surface, eyelid speculum. A (32g) needle was used.   Injection: 2 mg aflibercept  2 MG/0.05ML   Route: Intravitreal, Site: Right Eye   NDC: Q956576, Lot: [id number], Expiration date: 09/17/2025, Waste: 0 mL   Post-op Post injection exam found visual acuity of at least counting fingers. The patient tolerated the procedure well. There were no complications. The patient received written and verbal post procedure care education. Post injection medications were not given.           ASSESSMENT/PLAN:   ICD-10-CM   1. Branch retinal vein occlusion of right eye with macular edema (HCC)  H34.8310 OCT, Retina - OU - Both Eyes    Intravitreal Injection, Pharmacologic Agent - OD - Right Eye    aflibercept  (EYLEA ) SOLN 2 mg    2. Essential hypertension  I10     3. Hypertensive retinopathy of both eyes  H35.033     4. Pseudophakia, both eyes  Z96.1      1. BRVO w/ CME OD  - delayed f/u from 5 weeks to 11 weeks (05.13.25 to 07.30.25) - s/p IVA OD #1 (11.21.24), #2 (12.19.24), #3 (01.16.25) - IVA resistance  ========================== - s/p IVE OD #1 (02.13.25), #2 (03.18.25), #3 (04.15.25), #4 (05.13.25), #5 (07.30.25), #  6 (09.10.25) - BCVA OD 20/70 -- stable - exam shows IRH superior macula w/ extension of DBH to superior periphery -- improving - OCT shows OD: ERM; stable improvement in central edema with superior extension-- trace cystic changes remain, partial PVD at 6 weeks - recommend IVE OD #7 today, 10.22.25 w/ f/u in 6 wks again - pt wishes to proceed with injection -- pt covering 20% coinsurance for medication - RBA of procedure discussed, questions answered - IVE informed consent obtained and signed,  02.13.25 (OD) - see procedure note - F/U 6 weeks -- DFE/OCT/possible injection  2,3. Hypertensive retinopathy OU - discussed importance of tight BP control - monitor  4. Pseudophakia OU  - s/p CE/IOL OU (March 2023; Dr. Harrie)  - IOL in good position, doing well  - monitor  Ophthalmic Meds Ordered this visit:  Meds ordered this encounter  Medications   aflibercept  (EYLEA ) SOLN 2 mg    Return in about 6 weeks (around 09/20/2024) for f/u, BRVO, DFE, OCT, Possible, IVE, OD.  There are no Patient Instructions on file for this visit.  This document serves as a record of services personally performed by Redell JUDITHANN Hans, MD, PhD. It was created on their behalf by Almetta Pesa, an ophthalmic technician. The creation of this record is the provider's dictation and/or activities during the visit.    Electronically signed by: Almetta Pesa, OA, 08/16/24  8:56 PM  This document serves as a record of services personally performed by Redell JUDITHANN Hans, MD, PhD. It was created on their behalf by Wanda GEANNIE Keens, COT an ophthalmic technician. The creation of this record is the provider's dictation and/or activities during the visit.    Electronically signed by:  Wanda GEANNIE Keens, COT  08/16/24 8:56 PM  Redell JUDITHANN Hans, M.D., Ph.D. Diseases & Surgery of the Retina and Vitreous [facility]  I have reviewed the above documentation for accuracy and completeness, and I agree with the above. Redell JUDITHANN Hans, M.D., Ph.D. 08/16/24 8:59 PM   Abbreviations: M myopia (nearsighted); A astigmatism; H hyperopia (farsighted); P presbyopia; Mrx spectacle prescription;  CTL contact lenses; OD right eye; OS left eye; OU both eyes  XT exotropia; ET esotropia; PEK punctate epithelial keratitis; PEE punctate epithelial erosions; DES dry eye syndrome; MGD meibomian gland dysfunction; ATs artificial tears; PFAT's preservative free artificial tears; NSC nuclear sclerotic cataract; PSC  posterior subcapsular cataract; ERM epi-retinal membrane; PVD posterior vitreous detachment; RD retinal detachment; DM diabetes mellitus; DR diabetic retinopathy; NPDR non-proliferative diabetic retinopathy; PDR proliferative diabetic retinopathy; CSME clinically significant macular edema; DME diabetic macular edema; dbh dot blot hemorrhages; CWS cotton wool spot; POAG primary open angle glaucoma; C/D cup-to-disc ratio; HVF humphrey visual field; GVF goldmann visual field; OCT optical coherence tomography; IOP intraocular pressure; BRVO Branch retinal vein occlusion; CRVO central retinal vein occlusion; CRAO central retinal artery occlusion; BRAO branch retinal artery occlusion; RT retinal tear; SB scleral buckle; PPV pars plana vitrectomy; VH Vitreous hemorrhage; PRP panretinal laser photocoagulation; IVK intravitreal kenalog; VMT vitreomacular traction; MH Macular hole;  NVD neovascularization of the disc; NVE neovascularization elsewhere; AREDS age related eye disease study; ARMD age related macular degeneration; POAG primary open angle glaucoma; EBMD epithelial/anterior basement membrane dystrophy; ACIOL anterior chamber intraocular lens; IOL intraocular lens; PCIOL posterior chamber intraocular lens; Phaco/IOL phacoemulsification with intraocular lens placement; PRK photorefractive keratectomy; LASIK laser assisted in situ keratomileusis; HTN hypertension; DM diabetes mellitus; COPD chronic obstructive pulmonary disease

## 2024-08-09 ENCOUNTER — Encounter (INDEPENDENT_AMBULATORY_CARE_PROVIDER_SITE_OTHER): Payer: Self-pay | Admitting: Ophthalmology

## 2024-08-09 ENCOUNTER — Ambulatory Visit (INDEPENDENT_AMBULATORY_CARE_PROVIDER_SITE_OTHER): Admitting: Ophthalmology

## 2024-08-09 DIAGNOSIS — H34831 Tributary (branch) retinal vein occlusion, right eye, with macular edema: Secondary | ICD-10-CM | POA: Diagnosis not present

## 2024-08-09 DIAGNOSIS — H35033 Hypertensive retinopathy, bilateral: Secondary | ICD-10-CM

## 2024-08-09 DIAGNOSIS — Z961 Presence of intraocular lens: Secondary | ICD-10-CM | POA: Diagnosis not present

## 2024-08-09 DIAGNOSIS — I10 Essential (primary) hypertension: Secondary | ICD-10-CM

## 2024-08-10 ENCOUNTER — Encounter (INDEPENDENT_AMBULATORY_CARE_PROVIDER_SITE_OTHER): Payer: Self-pay | Admitting: Ophthalmology

## 2024-08-10 MED ORDER — AFLIBERCEPT 2MG/0.05ML IZ SOLN FOR KALEIDOSCOPE
2.0000 mg | INTRAVITREAL | Status: AC | PRN
  Administered 2024-08-10: 2 mg via INTRAVITREAL

## 2024-08-14 DIAGNOSIS — G309 Alzheimer's disease, unspecified: Secondary | ICD-10-CM | POA: Diagnosis not present

## 2024-08-14 DIAGNOSIS — R0602 Shortness of breath: Secondary | ICD-10-CM | POA: Diagnosis not present

## 2024-08-14 DIAGNOSIS — I1310 Hypertensive heart and chronic kidney disease without heart failure, with stage 1 through stage 4 chronic kidney disease, or unspecified chronic kidney disease: Secondary | ICD-10-CM | POA: Diagnosis not present

## 2024-08-14 DIAGNOSIS — F322 Major depressive disorder, single episode, severe without psychotic features: Secondary | ICD-10-CM | POA: Diagnosis not present

## 2024-08-14 DIAGNOSIS — R630 Anorexia: Secondary | ICD-10-CM | POA: Diagnosis not present

## 2024-08-14 DIAGNOSIS — F02B3 Dementia in other diseases classified elsewhere, moderate, with mood disturbance: Secondary | ICD-10-CM | POA: Diagnosis not present

## 2024-08-14 DIAGNOSIS — N1832 Chronic kidney disease, stage 3b: Secondary | ICD-10-CM | POA: Diagnosis not present

## 2024-08-14 DIAGNOSIS — G20A1 Parkinson's disease without dyskinesia, without mention of fluctuations: Secondary | ICD-10-CM | POA: Diagnosis not present

## 2024-08-14 DIAGNOSIS — D649 Anemia, unspecified: Secondary | ICD-10-CM | POA: Diagnosis not present

## 2024-08-14 DIAGNOSIS — J309 Allergic rhinitis, unspecified: Secondary | ICD-10-CM | POA: Diagnosis not present

## 2024-08-21 ENCOUNTER — Encounter: Payer: Self-pay | Admitting: Radiology

## 2024-08-28 ENCOUNTER — Ambulatory Visit: Admitting: Physician Assistant

## 2024-08-28 DIAGNOSIS — G309 Alzheimer's disease, unspecified: Secondary | ICD-10-CM | POA: Diagnosis not present

## 2024-08-28 DIAGNOSIS — D631 Anemia in chronic kidney disease: Secondary | ICD-10-CM | POA: Diagnosis not present

## 2024-08-28 DIAGNOSIS — G20B2 Parkinson's disease with dyskinesia, with fluctuations: Secondary | ICD-10-CM | POA: Diagnosis not present

## 2024-08-28 DIAGNOSIS — F339 Major depressive disorder, recurrent, unspecified: Secondary | ICD-10-CM | POA: Diagnosis not present

## 2024-08-28 DIAGNOSIS — F02B4 Dementia in other diseases classified elsewhere, moderate, with anxiety: Secondary | ICD-10-CM | POA: Diagnosis not present

## 2024-08-28 DIAGNOSIS — H35033 Hypertensive retinopathy, bilateral: Secondary | ICD-10-CM | POA: Diagnosis not present

## 2024-08-28 DIAGNOSIS — I129 Hypertensive chronic kidney disease with stage 1 through stage 4 chronic kidney disease, or unspecified chronic kidney disease: Secondary | ICD-10-CM | POA: Diagnosis not present

## 2024-08-28 DIAGNOSIS — I4510 Unspecified right bundle-branch block: Secondary | ICD-10-CM | POA: Diagnosis not present

## 2024-08-28 DIAGNOSIS — F02B3 Dementia in other diseases classified elsewhere, moderate, with mood disturbance: Secondary | ICD-10-CM | POA: Diagnosis not present

## 2024-08-28 DIAGNOSIS — N1832 Chronic kidney disease, stage 3b: Secondary | ICD-10-CM | POA: Diagnosis not present

## 2024-08-28 DIAGNOSIS — M170 Bilateral primary osteoarthritis of knee: Secondary | ICD-10-CM | POA: Diagnosis not present

## 2024-08-28 DIAGNOSIS — M5441 Lumbago with sciatica, right side: Secondary | ICD-10-CM | POA: Diagnosis not present

## 2024-08-28 DIAGNOSIS — M1712 Unilateral primary osteoarthritis, left knee: Secondary | ICD-10-CM

## 2024-08-28 DIAGNOSIS — M1711 Unilateral primary osteoarthritis, right knee: Secondary | ICD-10-CM

## 2024-08-28 DIAGNOSIS — I739 Peripheral vascular disease, unspecified: Secondary | ICD-10-CM | POA: Diagnosis not present

## 2024-08-28 MED ORDER — METHYLPREDNISOLONE ACETATE 40 MG/ML IJ SUSP
40.0000 mg | INTRAMUSCULAR | Status: AC | PRN
  Administered 2024-08-28: 40 mg via INTRA_ARTICULAR

## 2024-08-28 MED ORDER — LIDOCAINE HCL 1 % IJ SOLN
3.0000 mL | INTRAMUSCULAR | Status: AC | PRN
  Administered 2024-08-28: 3 mL

## 2024-08-28 NOTE — Progress Notes (Signed)
   Procedure Note  Patient: Linda [name]             Date of Birth: Mar 24, 1941           MRN: [id number]             Visit Date: 08/28/2024 PI Linda [name] comes in today wanting both knees injected.  She has known arthritis in both knees.  She presents today with her husband.  She is in a wheelchair.  She does have Parkinson's.  Her husband is having to make her primary caregiver.  She has had no new injuries.  Bilateral knees: No abnormal warmth or erythema.  Good range of motion of both knees.  No gross instability of either knee.  Procedures: Visit Diagnoses:  1. Primary osteoarthritis of right knee   2. Primary osteoarthritis of left knee     Large Joint Inj: bilateral knee on 08/28/2024 4:48 PM Indications: pain Details: 22 G 1.5 in needle, anterolateral approach  Arthrogram: No  Medications (Right): 3 mL lidocaine  1 %; 40 mg methylPREDNISolone  acetate 40 MG/ML Medications (Left): 3 mL lidocaine  1 %; 40 mg methylPREDNISolone  acetate 40 MG/ML Outcome: tolerated well, no immediate complications Procedure, treatment alternatives, risks and benefits explained, specific risks discussed. Consent was given by the patient. Immediately prior to procedure a time out was called to verify the correct patient, procedure, equipment, support staff and site/side marked as required. Patient was prepped and draped in the usual sterile fashion.     Plan: She will follow-up with us  as needed she and her husband know to wait least 3 months between injections.  Questions were encouraged and answered.

## 2024-08-31 DIAGNOSIS — R110 Nausea: Secondary | ICD-10-CM | POA: Diagnosis not present

## 2024-08-31 DIAGNOSIS — K219 Gastro-esophageal reflux disease without esophagitis: Secondary | ICD-10-CM | POA: Diagnosis not present

## 2024-08-31 DIAGNOSIS — R630 Anorexia: Secondary | ICD-10-CM | POA: Diagnosis not present

## 2024-08-31 DIAGNOSIS — G309 Alzheimer's disease, unspecified: Secondary | ICD-10-CM | POA: Diagnosis not present

## 2024-08-31 DIAGNOSIS — F02B3 Dementia in other diseases classified elsewhere, moderate, with mood disturbance: Secondary | ICD-10-CM | POA: Diagnosis not present

## 2024-08-31 DIAGNOSIS — F322 Major depressive disorder, single episode, severe without psychotic features: Secondary | ICD-10-CM | POA: Diagnosis not present

## 2024-09-05 NOTE — Progress Notes (Signed)
 [facility] - Clinic Note  09/19/2024   CHIEF COMPLAINT Patient presents for Retina Follow Up  HISTORY OF PRESENT ILLNESS: Linda [name] is a 83 y.o. female who presents to the clinic today for:  HPI     Retina Follow Up   Patient presents with  CRVO/BRVO.  In right eye.  Severity is moderate.  Duration of 6.  Since onset it is stable.  I, the attending physician,  performed the HPI with the patient and updated documentation appropriately.        Comments   Pt here for 6 wk ret f/u BRVO OD. Pt states VA is stable, no changes she's noticed. Pt reports taking systane daily PRN.       Last edited by Valdemar Rogue, MD on 09/19/2024  5:17 PM.      Pt states vision is good, no changes she's noticed.   Referring physician: Shayne Anes, MD [address] [place],  [place] [address]  HISTORICAL INFORMATION:  Selected notes from the MEDICAL RECORD NUMBER Referred by Dr. Darroll for BRVO OD LEE: 11.12.24, BCVA OD: CF@1 ', OS: 20/40-2 Ocular Hx- BRVO OD, Dry Eye Syndrome, pseudophakia OU PMH- HTN   CURRENT MEDICATIONS: No current outpatient medications on file. (Ophthalmic Drugs)   No current facility-administered medications for this visit. (Ophthalmic Drugs)   Current Outpatient Medications (Other)  Medication Sig   acetaminophen  (TYLENOL ) 325 MG tablet Take 650 mg by mouth every 6 (six) hours as needed.   ALPRAZolam (XANAX) 0.5 MG tablet Take 0.5 mg by mouth daily.   benazepril (LOTENSIN) 40 MG tablet Take 40 mg by mouth daily.   calcium  carbonate (TUMS - DOSED IN MG ELEMENTAL CALCIUM ) 500 MG chewable tablet Chew 1 tablet by mouth daily.   Calcium -Vitamin D-Vitamin K (VIACTIV CALCIUM  PLUS D PO) Take 2 tablets by mouth 2 (two) times daily.   desvenlafaxine (PRISTIQ) 100 MG 24 hr tablet Take 100 mg by mouth daily.    fosfomycin (MONUROL) 3 g PACK Take 3 g by mouth once.   lamoTRIgine (LAMICTAL) 100 MG tablet Take 100 mg by mouth.   loratadine (CLARITIN)  10 MG tablet Take 10 mg by mouth daily.   Multiple Vitamins-Minerals (ONE-A-DAY WOMENS 50+ ADVANTAGE) TABS Take 1 tablet by mouth daily.   nystatin (MYCOSTATIN/NYSTOP) powder Apply 1 Application topically.   omeprazole  (PRILOSEC) 40 MG capsule TAKE ONE CAPSULE (40MG  TOTAL) BY MOUTH DAILY   ondansetron  (ZOFRAN ) 8 MG tablet Take 1 tablet (8 mg total) by mouth every 8 (eight) hours as needed for nausea.   polyethylene glycol (MIRALAX) 17 g packet MiraLax   propranolol (INDERAL) 10 MG tablet Take 10 mg by mouth 2 (two) times daily.   rosuvastatin  (CRESTOR ) 20 MG tablet Take 1 tablet (20 mg total) by mouth daily.   spironolactone  (ALDACTONE ) 25 MG tablet TAKE ONE TABLET (25MG  TOTAL) BY MOUTH DAILY   triamcinolone cream (KENALOG) 0.1 % apply to the itching areas TID prn   No current facility-administered medications for this visit. (Other)   REVIEW OF SYSTEMS: ROS   Positive for: Neurological, Musculoskeletal, Eyes Negative for: Constitutional, Gastrointestinal, Skin, Genitourinary, HENT, Endocrine, Cardiovascular, Respiratory, Psychiatric, Allergic/Imm, Heme/Lymph Last edited by Antonetta Almetta BRAVO, COT on 09/19/2024  1:03 PM.      ALLERGIES Allergies  Allergen Reactions   Amoxicillin Hives   Cefdinir Other (See Comments)   Other Other (See Comments)   Penicillin G Other (See Comments)   Abilify [Aripiprazole] Other (See Comments)  Makes pt tremble   PAST MEDICAL HISTORY Past Medical History:  Diagnosis Date   Anxiety    Candida esophagitis (HCC)    Chronic kidney disease    DDD (degenerative disc disease)    Depression    Erosive gastritis    Falls    GERD (gastroesophageal reflux disease)    Hiatal hernia    High cholesterol    History of anal fissures    Hypertension    Obesity    Osteoarthritis    Panic disorder    PONV (postoperative nausea and vomiting)    Sleep apnea    Does not need CPAP anymore   Past Surgical History:  Procedure Laterality Date   ABDOMINAL  HYSTERECTOMY  1987   LSO / menorrhagia & leiomyomata   APPENDECTOMY     BREAST EXCISIONAL BIOPSY Left 1989   benign   BREAST EXCISIONAL BIOPSY Left 07/11/2020   benign   BREAST SURGERY  1989   benign tumor left breast   CHOLECYSTECTOMY  2001   TUBAL LIGATION     FAMILY HISTORY Family History  Problem Relation Age of Onset   Heart disease Mother    Lung cancer Father    Diabetes Sister    Hypertension Sister    Ovarian cancer Sister    Diabetes Brother    Heart disease Brother    Heart attack Brother    Heart disease Brother    Heart attack Brother    Heart disease Brother    Heart attack Brother    Colon cancer Neg Hx    BRCA 1/2 Neg Hx    Breast cancer Neg Hx    SOCIAL HISTORY Social History   Tobacco Use   Smoking status: Never    Passive exposure: Never   Smokeless tobacco: Never  Vaping Use   Vaping status: Never Used  Substance Use Topics   Alcohol  use: No   Drug use: No       OPHTHALMIC EXAM:  Base Eye Exam     Visual Acuity (Snellen - Linear)       Right Left   Dist cc 20/70 -1 20/40 -2   Dist ph cc NI NI    Correction: Glasses         Tonometry (Tonopen, 1:11 PM)       Right Left   Pressure 15 16         Pupils       Pupils Dark Light Shape React APD   Right PERRL 4 3 Round Brisk None   Left PERRL 4 3 Round Brisk None         Visual Fields (Counting fingers)       Left Right    Full Full         Extraocular Movement       Right Left    Full, Ortho Full, Ortho         Neuro/Psych     Oriented x3: Yes   Mood/Affect: Normal         Dilation     Both eyes: 1.0% Mydriacyl , 2.5% Phenylephrine  @ 1:12 PM           Slit Lamp and Fundus Exam     Slit Lamp Exam       Right Left   Lids/Lashes Dermatochalasis - upper lid, mild MGD Dermatochalasis - upper lid, mild MGD   Conjunctiva/Sclera White and quiet White and quiet   Cornea well healed cataract wound, tear  film debris well healed cataract wound, tear  film debris   Anterior Chamber deep and clear deep and clear   Iris Round and dilated Round and dilated   Lens PC IOL in good position PC IOL in good position   Anterior Vitreous  Posterior vitreous detachment         Fundus Exam       Right Left   Disc Pink and Sharp, mild tilt, temporal PPA mild Pallor, mild tilt, Compact, PPA   C/D Ratio 0.2 0.2   Macula Blunted foveal reflex, persistent central edema, confluent IRH / flame hemes superior macula -- stably improved, mild ERM Flat, Blunted foveal reflex, RPE mottling, No heme or edema   Vessels attenuated, Tortuous, ST BRVO -- severe attenuation of ST vessels attenuated, Tortuous   Periphery Attached, +DBH ST quadrant -- improving, peripheral drusen, mild reticular degeneration Attached, mild reticular degeneration, No heme           Refraction     Wearing Rx       Sphere Cylinder Axis Add   Right -0.50 +0.25 085 +2.50   Left -1.50 +0.75 055 +2.50           IMAGING AND PROCEDURES  Imaging and Procedures for 09/19/2024  OCT, Retina - OU - Both Eyes       Right Eye Quality was borderline. Central Foveal Thickness: 365. Progression has been stable. Findings include no SRF, abnormal foveal contour, intraretinal hyper-reflective material, epiretinal membrane, intraretinal fluid (stable improvement in central edema with superior extension-- trace cystic changes remain; +ERM; partial PVD).   Left Eye Quality was borderline. Central Foveal Thickness: 268. Progression has been stable. Findings include normal foveal contour, no IRF, no SRF.   Notes *Images captured and stored on drive  Diagnosis / Impression:  OD: BRVO - stable improvement in central edema with superior extension-- trace cystic changes remain, +ERM, partial PVD OS: NFP, no IRF/SRF  Clinical management:  See below  Abbreviations: NFP - Normal foveal profile. CME - cystoid macular edema. PED - pigment epithelial detachment. IRF - intraretinal fluid. SRF  - subretinal fluid. EZ - ellipsoid zone. ERM - epiretinal membrane. ORA - outer retinal atrophy. ORT - outer retinal tubulation. SRHM - subretinal hyper-reflective material. IRHM - intraretinal hyper-reflective material      Intravitreal Injection, Pharmacologic Agent - OD - Right Eye       Time Out 09/19/2024. 1:24 PM. Confirmed correct patient, procedure, site, and patient consented.   Anesthesia Topical anesthesia was used. Anesthetic medications included Lidocaine  2%, Proparacaine 0.5%.   Procedure Preparation included 5% betadine  to ocular surface, eyelid speculum. A (32g) needle was used.   Injection: 2 mg aflibercept  2 MG/0.05ML   Route: Intravitreal, Site: Right Eye   NDC: Q956576, Lot: [id number], Expiration date: 09/17/2025, Waste: 0 mL   Post-op Post injection exam found visual acuity of at least counting fingers. The patient tolerated the procedure well. There were no complications. The patient received written and verbal post procedure care education. Post injection medications were not given.           ASSESSMENT/PLAN:   ICD-10-CM   1. Branch retinal vein occlusion of right eye with macular edema (HCC)  H34.8310 OCT, Retina - OU - Both Eyes    Intravitreal Injection, Pharmacologic Agent - OD - Right Eye    aflibercept  (EYLEA ) SOLN 2 mg    2. Essential hypertension  I10     3. Hypertensive retinopathy of both eyes  H35.033  4. Pseudophakia, both eyes  Z96.1       1. BRVO w/ CME OD  - delayed f/u from 5 weeks to 11 weeks (05.13.25 to 07.30.25) - s/p IVA OD #1 (11.21.24), #2 (12.19.24), #3 (01.16.25) - IVA resistance  ========================== - s/p IVE OD #1 (02.13.25), #2 (03.18.25), #3 (04.15.25), #4 (05.13.25), #5 (07.30.25), #6 (09.10.25), #7 (10.22.25) - BCVA OD 20/70 -- stable - exam shows IRH superior macula w/ extension of DBH to superior periphery -- improving - OCT shows OD: ERM; stable improvement in central edema with superior  extension-- trace cystic changes remain, partial PVD at 6 weeks - recommend IVE OD #8 today, 12.02.25 w/ f/u ext to 6-7 wks  - pt wishes to proceed with injection -- pt covering 20% coinsurance for medication - RBA of procedure discussed, questions answered - IVE informed consent obtained and signed, 02.13.25 (OD) - see procedure note - F/U 6-7 weeks -- DFE/OCT/possible injection  2,3. Hypertensive retinopathy OU - discussed importance of tight BP control - monitor  4. Pseudophakia OU  - s/p CE/IOL OU (March 2023; Dr. Harrie)  - IOL in good position, doing well  - monitor  Ophthalmic Meds Ordered this visit:  Meds ordered this encounter  Medications   aflibercept  (EYLEA ) SOLN 2 mg    Return for 6-7 weeks BRVO OD, DFE, OCT, Possible Injxn.  There are no Patient Instructions on file for this visit.   This document serves as a record of services personally performed by Redell JUDITHANN Hans, MD, PhD. It was created on their behalf by Wanda GEANNIE Keens, COT an ophthalmic technician. The creation of this record is the provider's dictation and/or activities during the visit.    Electronically signed by:  Wanda GEANNIE Keens, COT  09/20/24 11:53 PM  This document serves as a record of services personally performed by Redell JUDITHANN Hans, MD, PhD. It was created on their behalf by Almetta Pesa, an ophthalmic technician. The creation of this record is the provider's dictation and/or activities during the visit.    Electronically signed by: Almetta Pesa, OA, 09/20/24  11:53 PM   Redell JUDITHANN Hans, M.D., Ph.D. Diseases & Surgery of the Retina and Vitreous [facility] [place]  I have reviewed the above documentation for accuracy and completeness, and I agree with the above. Redell JUDITHANN Hans, M.D., Ph.D. 09/20/24 11:57 PM   Abbreviations: M myopia (nearsighted); A astigmatism; H hyperopia (farsighted); P presbyopia; Mrx spectacle prescription;  CTL contact lenses; OD right  eye; OS left eye; OU both eyes  XT exotropia; ET esotropia; PEK punctate epithelial keratitis; PEE punctate epithelial erosions; DES dry eye syndrome; MGD meibomian gland dysfunction; ATs artificial tears; PFAT's preservative free artificial tears; NSC nuclear sclerotic cataract; PSC posterior subcapsular cataract; ERM epi-retinal membrane; PVD posterior vitreous detachment; RD retinal detachment; DM diabetes mellitus; DR diabetic retinopathy; NPDR non-proliferative diabetic retinopathy; PDR proliferative diabetic retinopathy; CSME clinically significant macular edema; DME diabetic macular edema; dbh dot blot hemorrhages; CWS cotton wool spot; POAG primary open angle glaucoma; C/D cup-to-disc ratio; HVF humphrey visual field; GVF goldmann visual field; OCT optical coherence tomography; IOP intraocular pressure; BRVO Branch retinal vein occlusion; CRVO central retinal vein occlusion; CRAO central retinal artery occlusion; BRAO branch retinal artery occlusion; RT retinal tear; SB scleral buckle; PPV pars plana vitrectomy; VH Vitreous hemorrhage; PRP panretinal laser photocoagulation; IVK intravitreal kenalog; VMT vitreomacular traction; MH Macular hole;  NVD neovascularization of the disc; NVE neovascularization elsewhere; AREDS age related eye disease study; ARMD  age related macular degeneration; POAG primary open angle glaucoma; EBMD epithelial/anterior basement membrane dystrophy; ACIOL anterior chamber intraocular lens; IOL intraocular lens; PCIOL posterior chamber intraocular lens; Phaco/IOL phacoemulsification with intraocular lens placement; PRK photorefractive keratectomy; LASIK laser assisted in situ keratomileusis; HTN hypertension; DM diabetes mellitus; COPD chronic obstructive pulmonary disease

## 2024-09-12 ENCOUNTER — Emergency Department (HOSPITAL_COMMUNITY)
Admission: EM | Admit: 2024-09-12 | Discharge: 2024-09-12 | Disposition: A | Discharge status: Home / Self Care | Source: Other Acute Inpatient Hospital

## 2024-09-12 ENCOUNTER — Emergency Department (HOSPITAL_COMMUNITY)

## 2024-09-12 ENCOUNTER — Other Ambulatory Visit: Payer: Self-pay

## 2024-09-12 ENCOUNTER — Encounter (HOSPITAL_COMMUNITY): Payer: Self-pay

## 2024-09-12 DIAGNOSIS — Z79899 Other long term (current) drug therapy: Secondary | ICD-10-CM | POA: Diagnosis not present

## 2024-09-12 DIAGNOSIS — I701 Atherosclerosis of renal artery: Secondary | ICD-10-CM | POA: Insufficient documentation

## 2024-09-12 DIAGNOSIS — I700 Atherosclerosis of aorta: Secondary | ICD-10-CM | POA: Diagnosis not present

## 2024-09-12 DIAGNOSIS — R1084 Generalized abdominal pain: Secondary | ICD-10-CM

## 2024-09-12 DIAGNOSIS — N183 Chronic kidney disease, stage 3 unspecified: Secondary | ICD-10-CM | POA: Insufficient documentation

## 2024-09-12 DIAGNOSIS — Z9049 Acquired absence of other specified parts of digestive tract: Secondary | ICD-10-CM | POA: Diagnosis not present

## 2024-09-12 DIAGNOSIS — K5730 Diverticulosis of large intestine without perforation or abscess without bleeding: Secondary | ICD-10-CM | POA: Diagnosis not present

## 2024-09-12 DIAGNOSIS — R109 Unspecified abdominal pain: Secondary | ICD-10-CM | POA: Diagnosis present

## 2024-09-12 DIAGNOSIS — N3000 Acute cystitis without hematuria: Secondary | ICD-10-CM | POA: Insufficient documentation

## 2024-09-12 DIAGNOSIS — I129 Hypertensive chronic kidney disease with stage 1 through stage 4 chronic kidney disease, or unspecified chronic kidney disease: Secondary | ICD-10-CM | POA: Insufficient documentation

## 2024-09-12 DIAGNOSIS — N2889 Other specified disorders of kidney and ureter: Secondary | ICD-10-CM | POA: Diagnosis not present

## 2024-09-12 LAB — CBC
HCT: 39.8 % (ref 36.0–46.0)
Hemoglobin: 13.1 g/dL (ref 12.0–15.0)
MCH: 32.8 pg (ref 26.0–34.0)
MCHC: 32.9 g/dL (ref 30.0–36.0)
MCV: 99.5 fL (ref 80.0–100.0)
Platelets: 337 K/uL (ref 150–400)
RBC: 4 MIL/uL (ref 3.87–5.11)
RDW: 11.9 % (ref 11.5–15.5)
WBC: 9.7 K/uL (ref 4.0–10.5)
nRBC: 0 % (ref 0.0–0.2)

## 2024-09-12 LAB — COMPREHENSIVE METABOLIC PANEL WITH GFR
ALT: <5 U/L (ref 0–44)
AST: 23 U/L (ref 15–41)
Albumin: 4.6 g/dL (ref 3.5–5.0)
Alkaline Phosphatase: 82 U/L (ref 38–126)
Anion gap: 7 (ref 5–15)
BUN: 21 mg/dL (ref 8–23)
CO2: 33 mmol/L — ABNORMAL HIGH (ref 22–32)
Calcium: 10.3 mg/dL (ref 8.9–10.3)
Chloride: 101 mmol/L (ref 98–111)
Creatinine, Ser: 1.07 mg/dL — ABNORMAL HIGH (ref 0.44–1.00)
GFR, Estimated: 52 mL/min — ABNORMAL LOW (ref >60)
Glucose, Bld: 96 mg/dL (ref 70–99)
Potassium: 4.1 mmol/L (ref 3.5–5.1)
Sodium: 140 mmol/L (ref 135–145)
Total Bilirubin: 0.2 mg/dL (ref 0.0–1.2)
Total Protein: 7.2 g/dL (ref 6.5–8.1)

## 2024-09-12 LAB — URINALYSIS, ROUTINE W REFLEX MICROSCOPIC
Bilirubin Urine: NEGATIVE
Glucose, UA: NEGATIVE mg/dL
Hgb urine dipstick: NEGATIVE
Ketones, ur: 5 mg/dL — AB
Nitrite: NEGATIVE
Protein, ur: NEGATIVE mg/dL
Specific Gravity, Urine: >1.046 — ABNORMAL HIGH (ref 1.005–1.030)
pH: 5 (ref 5.0–8.0)

## 2024-09-12 LAB — LIPASE, BLOOD: Lipase: 37 U/L (ref 11–51)

## 2024-09-12 MED ORDER — ONDANSETRON 4 MG PO TBDP: 4.0000 mg | ORAL_TABLET | Freq: Three times a day (TID) | ORAL | Status: DC | PRN

## 2024-09-12 MED ORDER — IOHEXOL 350 MG/ML SOLN
80.0000 mL | Freq: Once | INTRAVENOUS | Status: AC | PRN
  Administered 2024-09-12: 75 mL via INTRAVENOUS

## 2024-09-12 MED ORDER — SULFAMETHOXAZOLE-TRIMETHOPRIM 800-160 MG PO TABS: 1.0000 | ORAL_TABLET | Freq: Two times a day (BID) | ORAL | 0 refills | Status: AC

## 2024-09-12 MED ORDER — SODIUM CHLORIDE 0.9 % IV BOLUS
1000.0000 mL | Freq: Once | INTRAVENOUS | Status: AC
  Administered 2024-09-12: 1000 mL via INTRAVENOUS

## 2024-09-12 MED ORDER — IOHEXOL 300 MG/ML  SOLN
100.0000 mL | Freq: Once | INTRAMUSCULAR | Status: AC | PRN
  Administered 2024-09-12: 100 mL via INTRAVENOUS

## 2024-09-12 NOTE — ED Provider Triage Note (Signed)
 Emergency Medicine Provider Triage Evaluation Note  Linda [name] , a 83 y.o. female  was evaluated in triage.  Pt complains of abdominal cramping and rectal pain intermittently for 2 weeks, reports constipation and has not had normal bowel movement in about 2 weeks and is no longer passing gas.  She is tolerating oral intake and denies any vomiting.  Review of Systems  Positive: Nausea Negative: EVAR  Physical Exam  BP (!) 157/62   Pulse 63   Temp (!) 97.1 F (36.2 C) (Oral)   Resp 15   Ht 5' 1 (1.549 m)   Wt 61.2 kg   SpO2 100%   BMI 25.51 kg/m  Gen:   Awake, no distress  Resp:  Normal effort  MSK:   Moves extremities without difficulty  Other:    Medical Decision Making  Medically screening exam initiated at 3:21 PM.  Appropriate orders placed.  Linda [name] was informed that the remainder of the evaluation will be completed by another provider, this initial triage assessment does not replace that evaluation, and the importance of remaining in the ED until their evaluation is complete.     Shermon Warren SAILOR, PA-C 09/12/24 [id number]

## 2024-09-12 NOTE — Discharge Instructions (Addendum)
 Please call both nephrology and vascular to schedule follow-up for today's visit.  You need to follow-up with them for renal artery stenosis.  I would also recommend following up with primary care to readdress abdominal pain and make sure symptoms have resolved.  Your urine shows urinary tract infection so I have started you on an antibiotic.  Return to emergency room with new or worsening symptoms

## 2024-09-12 NOTE — ED Provider Notes (Signed)
 [place] EMERGENCY DEPARTMENT AT [facility] Provider Note   CSN: [id number] Arrival date & time: 09/12/24  1402     Patient presents with: Constipation   TANASHA MENEES is a 83 y.o. female patient with past medical history of chronic kidney disease, history of constipation, hypertension, GERD presents to emergency room with complaint of intermittent abdominal cramping over the last 2 weeks thought to be due to to constipation.  Patient reports she feels like she is having smaller and infrequent bowel movements.  She reports she has not had a normal bowel movement in 2 weeks but she is passing gas.  She is tolerating oral intake with some nausea, no vomiting.  No fever with this.    Constipation      Prior to Admission medications   Medication Sig Start Date End Date Taking? Authorizing Provider  acetaminophen  (TYLENOL ) 325 MG tablet Take 650 mg by mouth every 6 (six) hours as needed.    [provider]  ALPRAZolam (XANAX) 0.5 MG tablet Take 0.5 mg by mouth daily. 08/20/20   [provider]  benazepril (LOTENSIN) 40 MG tablet Take 40 mg by mouth daily.    [provider]  calcium  carbonate (TUMS - DOSED IN MG ELEMENTAL CALCIUM ) 500 MG chewable tablet Chew 1 tablet by mouth daily.    [provider]  Calcium -Vitamin D-Vitamin K (VIACTIV CALCIUM  PLUS D PO) Take 2 tablets by mouth 2 (two) times daily.    [provider]  desvenlafaxine (PRISTIQ) 100 MG 24 hr tablet Take 100 mg by mouth daily.  05/03/18   [provider]  fosfomycin (MONUROL) 3 g PACK Take 3 g by mouth once. 07/09/23   [provider]  lamoTRIgine (LAMICTAL) 100 MG tablet Take 100 mg by mouth. 01/17/24   [provider]  loratadine (CLARITIN) 10 MG tablet Take 10 mg by mouth daily.    [provider]  Multiple Vitamins-Minerals (ONE-A-DAY WOMENS 50+ ADVANTAGE) TABS Take 1 tablet by mouth daily. 03/13/14   [provider]   nystatin (MYCOSTATIN/NYSTOP) powder Apply 1 Application topically. 01/25/24   [provider]  omeprazole  (PRILOSEC) 40 MG capsule TAKE ONE CAPSULE (40MG  TOTAL) BY MOUTH DAILY 10/21/23   Abran Norleen SAILOR, MD  ondansetron  (ZOFRAN ) 8 MG tablet Take 1 tablet (8 mg total) by mouth every 8 (eight) hours as needed for nausea. 01/07/17   Esterwood, Amy S, PA-C  polyethylene glycol (MIRALAX) 17 g packet MiraLax    [provider]  propranolol (INDERAL) 10 MG tablet Take 10 mg by mouth 2 (two) times daily. 11/11/21   [provider]  rosuvastatin  (CRESTOR ) 20 MG tablet Take 1 tablet (20 mg total) by mouth daily. 02/04/22   Acharya, Gayatri A, MD  spironolactone  (ALDACTONE ) 25 MG tablet TAKE ONE TABLET (25MG  TOTAL) BY MOUTH DAILY 05/04/23   Acharya, Gayatri A, MD  triamcinolone cream (KENALOG) 0.1 % apply to the itching areas TID prn 02/20/19   [provider]    Allergies: Amoxicillin, Cefdinir, Other, Penicillin g, and Abilify [aripiprazole]    Review of Systems  Gastrointestinal:  Positive for constipation.    Updated Vital Signs BP (!) 157/62   Pulse 63   Temp 97.8 F (36.6 C)   Resp 15   Ht 5' 1 (1.549 m)   Wt 61.2 kg   SpO2 100%   BMI 25.51 kg/m   Physical Exam Vitals and nursing note reviewed.  Constitutional:      General: She  is not in acute distress.    Appearance: She is not toxic-appearing.  HENT:     Head: Normocephalic and atraumatic.  Eyes:     General: No scleral icterus.    Conjunctiva/sclera: Conjunctivae normal.  Cardiovascular:     Rate and Rhythm: Normal rate and regular rhythm.     Pulses: Normal pulses.     Heart sounds: Normal heart sounds.  Pulmonary:     Effort: Pulmonary effort is normal. No respiratory distress.     Breath sounds: Normal breath sounds.  Abdominal:     General: Abdomen is flat. Bowel sounds are normal.     Palpations: Abdomen is soft.     Tenderness: There is abdominal tenderness. There is no right CVA  tenderness or left CVA tenderness.     Comments: Generalized TTP   Skin:    General: Skin is warm and dry.     Findings: No lesion.  Neurological:     General: No focal deficit present.     Mental Status: She is alert and oriented to person, place, and time. Mental status is at baseline.     (all labs ordered are listed, but only abnormal results are displayed) Labs Reviewed  COMPREHENSIVE METABOLIC PANEL WITH GFR - Abnormal; Notable for the following components:      Result Value   CO2 33 (*)    Creatinine, Ser 1.07 (*)    GFR, Estimated 52 (*)    All other components within normal limits  LIPASE, BLOOD  CBC  URINALYSIS, ROUTINE W REFLEX MICROSCOPIC    EKG: None  Radiology: CT ABDOMEN PELVIS W CONTRAST Result Date: 09/12/2024 EXAM: CT ABDOMEN AND PELVIS WITH CONTRAST 09/12/2024 05:18:04 PM TECHNIQUE: CT of the abdomen and pelvis was performed with the administration of 100 mL of iohexol  (OMNIPAQUE ) 300 MG/ML solution. Multiplanar reformatted images are provided for review. Automated exposure control, iterative reconstruction, and/or weight-based adjustment of the mA/kV was utilized to reduce the radiation dose to as low as reasonably achievable. COMPARISON: None available. CLINICAL HISTORY: Bowel obstruction suspected. FINDINGS: LOWER CHEST: No acute abnormality. LIVER: The liver is unremarkable. GALLBLADDER AND BILE DUCTS: Status post cholecystectomy. No biliary ductal dilatation. SPLEEN: No acute abnormality. PANCREAS: No acute abnormality. ADRENAL GLANDS: No acute abnormality. KIDNEYS, URETERS AND BLADDER: No stones in the kidneys or ureters. No hydronephrosis. No perinephric or periureteral stranding. Urinary bladder is unremarkable. GI AND BOWEL: Moderate sigmoid diverticulosis. Small stool burden. The stomach, small bowel, and large bowel are otherwise unremarkable. Status post appendectomy. PERITONEUM AND RETROPERITONEUM: No ascites. No free air. VASCULATURE: Moderate aortoiliac  atherosclerotic calcification. No aortic aneurysm. Prominent atherosclerotic calcifications at the origin of the renal arteries bilaterally, however, the degree of stenosis is not well assessed on this non arteriographic study. If there is clinical evidence of hemodynamically significant renal artery stenosis, CT arteriography is recommended for further evaluation. LYMPH NODES: No lymphadenopathy. REPRODUCTIVE ORGANS: Status post hysterectomy. No adnexal mass. BONES AND SOFT TISSUES: Osseous structures are age appropriate. No acute bone abnormality. No lytic or blastic bone lesion. No focal soft tissue abnormality. IMPRESSION: 1. No acute findings. No evidence of bowel obstruction. 2. Moderate sigmoid diverticulosis 3. Peripheral vascular disease. Prominent atherosclerotic calcification at the bilateral renal artery origins; if there is clinical evidence of hemodynamically significant renal artery stenosis, CT arteriography is recommended for further evaluation. Electronically signed by: Dorethia Molt MD 09/12/2024 05:45 PM EST RP Workstation: [id number]     Procedures   Medications Ordered in the ED  ondansetron  (  ZOFRAN -ODT) disintegrating tablet 4 mg (has no administration in time range)  sodium chloride  0.9 % bolus 1,000 mL (has no administration in time range)  iohexol  (OMNIPAQUE ) 300 MG/ML solution 100 mL (100 mLs Intravenous Contrast Given 09/12/24 1704)  iohexol  (OMNIPAQUE ) 350 MG/ML injection 80 mL (75 mLs Intravenous Contrast Given 09/12/24 1836)                                    Medical Decision Making Amount and/or Complexity of Data Reviewed Labs: ordered. Radiology: ordered.  Risk Prescription drug management.   This patient presents to the ED for concern of abdominal pain, this involves an extensive number of treatment options, and is a complaint that carries with it a high risk of complications and morbidity.  The differential diagnosis includes cholecystitis, AAA,  appendicitis, renal stone, UTI   Co morbidities that complicate the patient evaluation  Hypertension, hyperlipidemia, recurrent falls, chronic kidney disease stage III   Lab Tests:  I personally interpreted labs.  The pertinent results include:   CBC unremarkable, lipase unremarkable.  CMP with creatinine of 1.07 consistent with baseline.  Urine is positive for WBC, few bacteria and moderate leukocytes patient is reporting suprapubic pain and intermittent dysuria thus sent for culture and started on antibiotic   Imaging Studies ordered:  I ordered imaging studies including CT scan of abdomen and pelvis with contrast. I independently visualized and interpreted imaging which showed no acute findings, does show peripheral vascular disease specifically significant arthrosclerosis of bilateral renal arteries recommending CT angio. I agree with the radiologist interpretation Given that patient continues to have abdominal pain without explanation I will obtain CT angio to further evaluate renal arteries while in emergency room.  Patient and family ember are agreeable to this.   Cardiac Monitoring: / EKG:  The patient was maintained on a cardiac monitor.     Problem List / ED Course / Critical interventions / Medication management  Patient presents to emergency room with complaint of intermittent abdominal pain for approximately 2 weeks.  She reports she has been having decreased number of bowel movements but she does admit that she is passing gas.  She is hemodynamically stable and well-appearing.  She does have generalized abdominal tenderness on exam so I will obtain CT of abdomen pelvis to further characterize.  So far her lab work is overall reassuring with normal CBC, no leukocytosis.  Her CMP is at baseline. Patient CT abdomen pelvis shows no acute findings.  Recommending CT angio of the renal arteries.  I had discussion with patient and family ember at bedside would like to proceed  with having some emergency room.  CT angio of abdomen pelvis showing narrowing of left renal artery likely greater than 70% and mild narrowing of right renal artery. Patient's urine is positive for few bacteria and moderate leukocytes.  I will start her on antibiotic and send for culture. I ordered medication including zofran   Reevaluation of the patient after these medicines showed that the patient improved I have reviewed the patients home medicines and have made adjustments as needed. Given appropriate follow-up instructions and return precautions.  Feel stable for discharge.      Final diagnoses:  Generalized abdominal pain  Renal artery stenosis  Acute cystitis without hematuria    ED Discharge Orders     None          Shermon Warren SAILOR, PA-C 09/12/24 2132  Simon Lavonia SAILOR, MD 09/12/24 2207

## 2024-09-12 NOTE — ED Triage Notes (Signed)
 Pt reports constipation for about two weeks now. Pt reports abdominal pain. Pt reports nausea no vomiting. Pt reports trying Miralax at home with no relief.

## 2024-09-15 LAB — URINE CULTURE: Culture: >=100000 — AB

## 2024-09-16 ENCOUNTER — Telehealth (HOSPITAL_BASED_OUTPATIENT_CLINIC_OR_DEPARTMENT_OTHER): Payer: Self-pay | Admitting: *Deleted

## 2024-09-16 NOTE — Progress Notes (Signed)
 ED Antimicrobial Stewardship Positive Culture Follow Up   Linda [name] is an 83 y.o. female who presented to [facility] on 09/12/2024 with a chief complaint of  Chief Complaint  Patient presents with   Constipation    Recent Results (from the past 720 hours)  Urine Culture     Status: Abnormal   Collection Time: 09/12/24  8:26 PM   Specimen: Urine, Clean Catch  Result Value Ref Range Status   Specimen Description   Final    URINE, CLEAN CATCH Performed at [facility], [address], [place], [place] [address]    Special Requests   Final    NONE Performed at [facility], [address] [place], [place] [address]    Culture >=100,000 COLONIES/mL ESCHERICHIA COLI (A)  Final   Report Status 09/15/2024 FINAL  Final   Organism ID, Bacteria ESCHERICHIA COLI (A)  Final      Susceptibility   Escherichia coli - MIC*    AMPICILLIN 16 INTERMEDIATE Intermediate     CEFAZOLIN (URINE) Value in next row Sensitive      <=1 SENSITIVEThis is a modified FDA-approved test that has been validated and its performance characteristics determined by the reporting laboratory.  This laboratory is certified under the Clinical Laboratory Improvement Amendments CLIA as qualified to perform high complexity clinical laboratory testing.    CEFEPIME Value in next row Sensitive      <=1 SENSITIVEThis is a modified FDA-approved test that has been validated and its performance characteristics determined by the reporting laboratory.  This laboratory is certified under the Clinical Laboratory Improvement Amendments CLIA as qualified to perform high complexity clinical laboratory testing.    ERTAPENEM Value in next row Sensitive      <=1 SENSITIVEThis is a modified FDA-approved test that has been validated and its performance characteristics determined by the reporting laboratory.  This laboratory is certified under the Clinical Laboratory Improvement Amendments CLIA as qualified to perform high complexity clinical  laboratory testing.    CEFTRIAXONE Value in next row Sensitive      <=1 SENSITIVEThis is a modified FDA-approved test that has been validated and its performance characteristics determined by the reporting laboratory.  This laboratory is certified under the Clinical Laboratory Improvement Amendments CLIA as qualified to perform high complexity clinical laboratory testing.    CIPROFLOXACIN  Value in next row Resistant      <=1 SENSITIVEThis is a modified FDA-approved test that has been validated and its performance characteristics determined by the reporting laboratory.  This laboratory is certified under the Clinical Laboratory Improvement Amendments CLIA as qualified to perform high complexity clinical laboratory testing.    GENTAMICIN Value in next row Sensitive      <=1 SENSITIVEThis is a modified FDA-approved test that has been validated and its performance characteristics determined by the reporting laboratory.  This laboratory is certified under the Clinical Laboratory Improvement Amendments CLIA as qualified to perform high complexity clinical laboratory testing.    NITROFURANTOIN  Value in next row Sensitive      <=1 SENSITIVEThis is a modified FDA-approved test that has been validated and its performance characteristics determined by the reporting laboratory.  This laboratory is certified under the Clinical Laboratory Improvement Amendments CLIA as qualified to perform high complexity clinical laboratory testing.    TRIMETH /SULFA  Value in next row Resistant      <=1 SENSITIVEThis is a modified FDA-approved test that has been validated and its performance characteristics determined by the reporting laboratory.  This laboratory is certified  under the Clinical Laboratory Improvement Amendments CLIA as qualified to perform high complexity clinical laboratory testing.    AMPICILLIN/SULBACTAM Value in next row Sensitive      <=1 SENSITIVEThis is a modified FDA-approved test that has been validated and  its performance characteristics determined by the reporting laboratory.  This laboratory is certified under the Clinical Laboratory Improvement Amendments CLIA as qualified to perform high complexity clinical laboratory testing.    PIP/TAZO Value in next row Sensitive      <=4 SENSITIVEThis is a modified FDA-approved test that has been validated and its performance characteristics determined by the reporting laboratory.  This laboratory is certified under the Clinical Laboratory Improvement Amendments CLIA as qualified to perform high complexity clinical laboratory testing.    MEROPENEM Value in next row Sensitive      <=4 SENSITIVEThis is a modified FDA-approved test that has been validated and its performance characteristics determined by the reporting laboratory.  This laboratory is certified under the Clinical Laboratory Improvement Amendments CLIA as qualified to perform high complexity clinical laboratory testing.    * >=100,000 COLONIES/mL ESCHERICHIA COLI    [x]  Treated with Bactrim , organism resistant to prescribed antimicrobial []  Patient discharged originally without antimicrobial agent and treatment is now indicated  New antibiotic prescription: Fosfomycin  ED Provider: [name], PA-C   Dorn Poot 09/16/2024, 10:12 AM Clinical Pharmacist Monday - Friday phone -  [phone] Saturday - Sunday phone - [phone]

## 2024-09-16 NOTE — Telephone Encounter (Signed)
 Post ED Visit - Positive Culture Follow-up: Successful Patient Follow-Up  Culture assessed and recommendations reviewed by:  []  Rankin Dee, Pharm.D. []  Venetia Gully, [address].D., BCPS AQ-ID []  Garrel Crews, Pharm.D., BCPS []  Almarie Lunger, [address].D., BCPS []  [place], [place].D., BCPS, AAHIVP []  Rosaline Bihari, Pharm.D., BCPS, AAHIVP []  Vernell Meier, PharmD, BCPS []  Latanya Hint, PharmD, BCPS []  Donald Medley, PharmD, BCPS [x]  Dorn Poot, PharmD  Positive urine culture  []  Patient discharged without antimicrobial prescription and treatment is now indicated [x]  Organism is resistant to prescribed ED discharge antimicrobial []  Patient with positive blood cultures  Changes discussed with ED provider: [name], PA New antibiotic prescription Fosfomycin 3g PO x 1 Stop Bactrim  Called to [facility] patient, date 09/16/24, time 1207   Linda [name] 09/16/2024, 12:07 PM

## 2024-09-19 ENCOUNTER — Encounter (INDEPENDENT_AMBULATORY_CARE_PROVIDER_SITE_OTHER): Payer: Self-pay | Admitting: Ophthalmology

## 2024-09-19 ENCOUNTER — Ambulatory Visit (INDEPENDENT_AMBULATORY_CARE_PROVIDER_SITE_OTHER): Admitting: Ophthalmology

## 2024-09-19 DIAGNOSIS — I10 Essential (primary) hypertension: Secondary | ICD-10-CM | POA: Diagnosis not present

## 2024-09-19 DIAGNOSIS — H35033 Hypertensive retinopathy, bilateral: Secondary | ICD-10-CM | POA: Diagnosis not present

## 2024-09-19 DIAGNOSIS — Z961 Presence of intraocular lens: Secondary | ICD-10-CM

## 2024-09-19 DIAGNOSIS — H34831 Tributary (branch) retinal vein occlusion, right eye, with macular edema: Secondary | ICD-10-CM

## 2024-09-19 MED ORDER — AFLIBERCEPT 2MG/0.05ML IZ SOLN FOR KALEIDOSCOPE
2.0000 mg | INTRAVITREAL | Status: AC | PRN
  Administered 2024-09-19: 2 mg via INTRAVITREAL

## 2024-10-10 ENCOUNTER — Encounter: Payer: Self-pay | Admitting: *Deleted

## 2024-10-10 ENCOUNTER — Ambulatory Visit: Admitting: Internal Medicine

## 2024-10-10 VITALS — BP 132/60 | HR 59 | Ht 61.0 in | Wt 139.0 lb

## 2024-10-10 DIAGNOSIS — I4510 Unspecified right bundle-branch block: Secondary | ICD-10-CM | POA: Diagnosis not present

## 2024-10-10 DIAGNOSIS — E785 Hyperlipidemia, unspecified: Secondary | ICD-10-CM

## 2024-10-10 DIAGNOSIS — R002 Palpitations: Secondary | ICD-10-CM

## 2024-10-10 DIAGNOSIS — I10 Essential (primary) hypertension: Secondary | ICD-10-CM

## 2024-10-10 NOTE — Progress Notes (Signed)
 " Cardiology Office Note:  .   Date:  10/10/2024  ID:  Linda [name], DOB 01/26/1941, MRN [id number] PCP: Shayne Anes, MD  [facility] Providers Cardiologist:  Soyla DELENA Merck, MD    History of Present Illness: .   Linda [name] is a 83 y.o. female.  Discussed the use of AI scribe software for clinical note transcription with the patient, who gave verbal consent to proceed.  History of Present Illness Linda [name] is an 83 year old female with hypertension and palpitations who presents for a follow-up under the care of a neurologist. She is accompanied by her husband. Pleasantly confused today.   She has hypertension treated with benazepril, recently reduced to 10 mg daily, and propranolol 10 mg up to three times daily for palpitations. It is unclear if she is currently taking spironolactone  25 mg daily, which she previously used for fluid management.  It does not appear she is taking amlodipine any longer.  She has no chest pain, fever, chills, or significant cough. She has morning shortness of breath that improves during the day and occasional yellow mucus. Husband thinks there may be a component of anxiety and panic that contributes.  Objectively no signs of heart failure, no weight gain, no lower extremity swelling, shortness of breath improves throughout the day and has not worsened over time.  She has Parkinson's disease treated with carbidopa -levodopa  25/100 mg. She notes slower recovery after surgery.  She has hyperlipidemia treated with rosuvastatin  20 mg daily. Prior spironolactone  use for fluid management remains uncertain.  She has recurrent urinary tract infections treated with antibiotics and dementia with afternoon confusion and sundowning.    ROS: negative except per HPI above.  Studies Reviewed: .        Results Diagnostic EKG (October 2025): Sinus rhythm, right bundle branch block, left ventricular hypertrophy, QRS duration 154  milliseconds Risk Assessment/Calculations:       Physical Exam:   VS:  BP 132/60 (BP Location: Right Arm, Patient Position: Sitting, Cuff Size: Normal)   Pulse (!) 59   Ht 5' 1 (1.549 m)   Wt 139 lb (63 kg)   SpO2 97%   BMI 26.26 kg/m    Wt Readings from Last 3 Encounters:  10/10/24 139 lb (63 kg)  09/12/24 135 lb (61.2 kg)  08/07/24 136 lb (61.7 kg)     Physical Exam MEASUREMENTS: Weight- 139. GENERAL: Alert, cooperative, well developed, no acute distress. HEENT: Normocephalic, normal oropharynx, moist mucous membranes. CHEST: Clear to auscultation bilaterally, no wheezes, rhonchi, or crackles. CARDIOVASCULAR: Normal heart rate and rhythm, S1 and S2 normal without murmurs. ABDOMEN: Soft, non-tender, non-distended, without organomegaly, normal bowel sounds. EXTREMITIES: No cyanosis or edema. NEUROLOGICAL: Cranial nerves grossly intact, moves all extremities without gross motor or sensory deficit.   ASSESSMENT AND PLAN: .    Assessment and Plan Assessment & Plan Palpitations Managed with propranolol. No acute issues identified. - Continue propranolol 10 mg three times daily.  Hypertension Managed with benazepril. Recent medication adjustment by primary care provider. No acute issues identified. - Continue benazepril 10 mg daily.  We will contact the patient when she arrives home to perform further medication reconciliation as PCP medications appear different than the list we have at our facility.  Hyperlipidemia Managed with rosuvastatin . No acute issues identified. - Continue rosuvastatin  20 mg daily.  RBBB -stable  Given comorbidities and no significant active cardiac issues, routine cardiology follow up is not required and patient can be primarily followed  by PCP.     Soyla Merck, MD, [facility] "

## 2024-10-10 NOTE — Patient Instructions (Signed)
 Medication Instructions:  No Changes *If you need a refill on your cardiac medications before your next appointment, please call your pharmacy*  Lab Work: None  Follow-Up: At [facility], you and your health needs are our priority.  As part of our continuing mission to provide you with exceptional heart care, our providers are all part of one team.  This team includes your primary Cardiologist (physician) and Advanced Practice Providers or APPs (Physician Assistants and Nurse Practitioners) who all work together to provide you with the care you need, when you need it.  Your next appointment:   1 year(s) (We will mail a reminder letter around October for a December 2026 appointment)  Provider:   Gayatri A Acharya, MD   Other Instructions The nurse, Buel, will call you around 2 pm today to review all of your medications in detail.

## 2024-10-11 NOTE — Addendum Note (Signed)
 Addended by: Azavier Creson C on: 10/11/2024 09:39 AM   Modules accepted: Orders

## 2024-10-11 NOTE — Telephone Encounter (Signed)
 Called and spoke to pt's husband. Med list updated.   The updates/differences from the previous med list were:  NOT taking Spironolactone  ; NOT taking Omeprazole    Taking:  Benazepril (Lotensin) 10 mg once daily at bedtime Alprazolam (xanax) 0.5 mg TID  Famotidine  20 mg once daily Ondansetron  (Zofran ) 8 mg TID Propranolol (Inderal) 10 mg TID Mirtazapine (Remeron) 7.5 mg once daily at bedtime Potassium chloride  ER 10 MEQ BID

## 2024-10-20 NOTE — Progress Notes (Signed)
 " [facility] - Clinic Note  10/31/2024   CHIEF COMPLAINT Patient presents for Retina Follow Up  HISTORY OF PRESENT ILLNESS: Linda [name] is a 84 y.o. female who presents to the clinic today for:  HPI     Retina Follow Up   Patient presents with  CRVO/BRVO.  In right eye.  This started 13 months ago.  Severity is moderate.  Duration of 7 weeks.  I, the attending physician,  performed the HPI with the patient and updated documentation appropriately.        Comments   Pt denies any changes in vision. Optometrist told her left eye cornea was looking better. Pt has new glasses on order. Pt using Systane BID OU.      Last edited by Valdemar Rogue, MD on 10/31/2024  6:26 PM.      Referring physician: Shayne Anes, MD [address] [place],  [place] [address]  HISTORICAL INFORMATION:  Selected notes from the MEDICAL RECORD NUMBER Referred by Dr. Darroll for BRVO OD LEE: 11.12.24, BCVA OD: CF@1 ', OS: 20/40-2 Ocular Hx- BRVO OD, Dry Eye Syndrome, pseudophakia OU PMH- HTN   CURRENT MEDICATIONS: No current outpatient medications on file. (Ophthalmic Drugs)   No current facility-administered medications for this visit. (Ophthalmic Drugs)   Current Outpatient Medications (Other)  Medication Sig   acetaminophen  (TYLENOL ) 325 MG tablet Take 650 mg by mouth every 6 (six) hours as needed.   ALPRAZolam (XANAX) 0.5 MG tablet Take 0.5 mg by mouth daily. (Patient taking differently: Take 0.5 mg by mouth 3 (three) times daily.)   benazepril (LOTENSIN) 10 MG tablet Take 10 mg by mouth at bedtime.   calcium  carbonate (TUMS - DOSED IN MG ELEMENTAL CALCIUM ) 500 MG chewable tablet Chew 1 tablet by mouth daily.   Calcium -Vitamin D-Vitamin K (VIACTIV CALCIUM  PLUS D PO) Take 2 tablets by mouth 2 (two) times daily.   carbidopa -levodopa  (PARCOPA ) 25-100 MG disintegrating tablet 2 tablets Orally three times a day with meals   carbidopa -levodopa  (SINEMET  IR) 25-100 MG tablet TAKE 2  TABLETS BY MOUTH THREE TIMES A DAY   desvenlafaxine (PRISTIQ) 100 MG 24 hr tablet Take 100 mg by mouth daily.    ELDERBERRY PO Take by mouth.   famotidine  (PEPCID ) 20 MG tablet Take 20 mg by mouth daily.   fosfomycin (MONUROL) 3 g PACK Take 3 g by mouth once. (Patient not taking: Reported on 10/11/2024)   lamoTRIgine (LAMICTAL) 100 MG tablet Take 100 mg by mouth.   loratadine (CLARITIN) 10 MG tablet Take 10 mg by mouth daily.   mirtazapine (REMERON) 7.5 MG tablet Take 7.5 mg by mouth at bedtime.   Multiple Vitamins-Minerals (ONE-A-DAY WOMENS 50+ ADVANTAGE) TABS Take 1 tablet by mouth daily.   nystatin (MYCOSTATIN/NYSTOP) powder Apply 1 Application topically.   omeprazole  (PRILOSEC) 40 MG capsule TAKE ONE CAPSULE (40MG  TOTAL) BY MOUTH DAILY   ondansetron  (ZOFRAN ) 8 MG tablet Take 1 tablet (8 mg total) by mouth every 8 (eight) hours as needed for nausea. (Patient taking differently: Take 8 mg by mouth in the morning, at noon, and at bedtime.)   polyethylene glycol (MIRALAX) 17 g packet MiraLax   potassium chloride  (KLOR-CON ) 10 MEQ tablet Take 10 mEq by mouth 2 (two) times daily.   propranolol (INDERAL) 10 MG tablet Take 10 mg by mouth 2 (two) times daily. (Patient taking differently: Take 10 mg by mouth 3 (three) times daily.)   rosuvastatin  (CRESTOR ) 20 MG tablet Take 1 tablet (20 mg  total) by mouth daily.   triamcinolone cream (KENALOG) 0.1 % apply to the itching areas TID prn   No current facility-administered medications for this visit. (Other)   REVIEW OF SYSTEMS: ROS   Positive for: Neurological, Musculoskeletal, Eyes Negative for: Constitutional, Gastrointestinal, Skin, Genitourinary, HENT, Endocrine, Cardiovascular, Respiratory, Psychiatric, Allergic/Imm, Heme/Lymph Last edited by Elnor Avelina RAMAN, COT on 10/31/2024 12:38 PM.       ALLERGIES Allergies  Allergen Reactions   Amoxicillin Hives   Cefdinir Other (See Comments)   Other Other (See Comments)   Penicillin G Other (See  Comments)   Abilify [Aripiprazole] Other (See Comments)    Makes pt tremble   PAST MEDICAL HISTORY Past Medical History:  Diagnosis Date   Anxiety    Candida esophagitis (HCC)    Chronic kidney disease    DDD (degenerative disc disease)    Depression    Erosive gastritis    Falls    GERD (gastroesophageal reflux disease)    Hiatal hernia    High cholesterol    History of anal fissures    Hypertension    Obesity    Osteoarthritis    Panic disorder    PONV (postoperative nausea and vomiting)    Sleep apnea    Does not need CPAP anymore   Past Surgical History:  Procedure Laterality Date   ABDOMINAL HYSTERECTOMY  1987   LSO / menorrhagia & leiomyomata   APPENDECTOMY     BREAST EXCISIONAL BIOPSY Left 1989   benign   BREAST EXCISIONAL BIOPSY Left 07/11/2020   benign   BREAST SURGERY  1989   benign tumor left breast   CHOLECYSTECTOMY  2001   TUBAL LIGATION     FAMILY HISTORY Family History  Problem Relation Age of Onset   Heart disease Mother    Lung cancer Father    Diabetes Sister    Hypertension Sister    Ovarian cancer Sister    Diabetes Brother    Heart disease Brother    Heart attack Brother    Heart disease Brother    Heart attack Brother    Heart disease Brother    Heart attack Brother    Colon cancer Neg Hx    BRCA 1/2 Neg Hx    Breast cancer Neg Hx    SOCIAL HISTORY Social History   Tobacco Use   Smoking status: Never    Passive exposure: Never   Smokeless tobacco: Never  Vaping Use   Vaping status: Never Used  Substance Use Topics   Alcohol  use: No   Drug use: No       OPHTHALMIC EXAM:  Base Eye Exam     Visual Acuity (Snellen - Linear)       Right Left   Dist cc 20/80 -2 20/60 -2   Dist ph cc NI 20/50 -2         Tonometry (Tonopen, 12:47 PM)       Right Left   Pressure 14 13         Pupils       Pupils Dark Light Shape React APD   Right PERRL 5 3 Round Brisk None   Left PERRL 5 3 Round Brisk None          Visual Fields       Left Right    Full Full         Extraocular Movement       Right Left    Full, Ortho Full, Ortho  Neuro/Psych     Oriented x3: Yes   Mood/Affect: Normal         Dilation     Both eyes: 1.0% Mydriacyl , 2.5% Phenylephrine  @ 12:48 PM           Slit Lamp and Fundus Exam     Slit Lamp Exam       Right Left   Lids/Lashes Dermatochalasis - upper lid, mild MGD Dermatochalasis - upper lid, mild MGD   Conjunctiva/Sclera White and quiet White and quiet   Cornea well healed cataract wound, tear film debris well healed cataract wound, tear film debris   Anterior Chamber deep and clear deep and clear   Iris Round and dilated Round and dilated   Lens PC IOL in good position PC IOL in good position   Anterior Vitreous  Posterior vitreous detachment         Fundus Exam       Right Left   Disc Pink and Sharp, mild tilt, temporal PPA mild Pallor, mild tilt, Compact, PPA   C/D Ratio 0.2 0.2   Macula Blunted foveal reflex, persistent central edema, confluent IRH / flame hemes superior macula -- stably improved, mild ERM Flat, Blunted foveal reflex, RPE mottling, No heme or edema   Vessels attenuated, Tortuous, ST BRVO -- severe attenuation of ST vessels attenuated, Tortuous   Periphery Attached, +DBH ST quadrant -- improving, peripheral drusen, mild reticular degeneration Attached, mild reticular degeneration, No heme           Refraction     Wearing Rx       Sphere Cylinder Axis Add   Right -0.50 +0.25 085 +2.50   Left -1.50 +0.75 055 +2.50           IMAGING AND PROCEDURES  Imaging and Procedures for 10/31/2024  OCT, Retina - OU - Both Eyes       Right Eye Quality was good. Central Foveal Thickness: 370. Progression has been stable. Findings include no SRF, abnormal foveal contour, intraretinal hyper-reflective material, epiretinal membrane, intraretinal fluid (stable improvement in central edema with superior extension-- trace  cystic changes remain; +ERM w/ blunted foveal contour; partial PVD).   Left Eye Quality was borderline. Central Foveal Thickness: 268. Progression has been stable. Findings include normal foveal contour, no IRF, no SRF.   Notes *Images captured and stored on drive  Diagnosis / Impression:  OD: BRVO - stable improvement in central edema with superior extension-- trace cystic changes remain; +ERM w/ blunted foveal contour; partial PVD OS: NFP, no IRF/SRF  Clinical management:  See below  Abbreviations: NFP - Normal foveal profile. CME - cystoid macular edema. PED - pigment epithelial detachment. IRF - intraretinal fluid. SRF - subretinal fluid. EZ - ellipsoid zone. ERM - epiretinal membrane. ORA - outer retinal atrophy. ORT - outer retinal tubulation. SRHM - subretinal hyper-reflective material. IRHM - intraretinal hyper-reflective material      Intravitreal Injection, Pharmacologic Agent - OD - Right Eye       Time Out 10/31/2024. 1:34 PM. Confirmed correct patient, procedure, site, and patient consented.   Anesthesia Topical anesthesia was used. Anesthetic medications included Lidocaine  2%, Proparacaine 0.5%.   Procedure Preparation included 5% betadine  to ocular surface, eyelid speculum. A (32g) needle was used.   Injection: 2 mg aflibercept  2 MG/0.05ML   Route: Intravitreal, Site: Right Eye   NDC: Q956576, Lot: [id number], Expiration date: 11/17/2025, Waste: 0 mL   Post-op Post injection exam found visual acuity of at least counting fingers.  The patient tolerated the procedure well. There were no complications. The patient received written and verbal post procedure care education. Post injection medications were not given.            ASSESSMENT/PLAN:   ICD-10-CM   1. Branch retinal vein occlusion of right eye with macular edema (HCC)  H34.8310 OCT, Retina - OU - Both Eyes    Intravitreal Injection, Pharmacologic Agent - OD - Right Eye    aflibercept  (EYLEA ) SOLN  2 mg    2. Essential hypertension  I10     3. Hypertensive retinopathy of both eyes  H35.033     4. Pseudophakia, both eyes  Z96.1      1. BRVO w/ CME OD  - delayed f/u from 5 weeks to 11 weeks (05.13.25 to 07.30.25) - s/p IVA OD #1 (11.21.24), #2 (12.19.24), #3 (01.16.25)  - IVA resistance  ========================== - s/p IVE OD #1 (02.13.25), #2 (03.18.25), #3 (04.15.25), #4 (05.13.25), #5 (07.30.25), #6 (09.10.25), #7 (10.22.25), #8 (12.02.25) - BCVA OD 20/80 from 20/70 - exam shows IRH superior macula w/ extension of DBH to superior periphery -- improving - OCT shows OD: stable improvement in central edema with superior extension-- trace cystic changes remain; +ERM w/ blunted foveal contour; partial PVD at 6 weeks - recommend IVE OD #9 today, 01.13.26 w/ f/u ext to 7 wks  - pt wishes to proceed with injection -- pt covering 20% coinsurance for medication - RBA of procedure discussed, questions answered - IVE informed consent obtained and signed, 02.13.25 (OD) - see procedure note - F/U 7 weeks -- DFE/OCT/possible injection  2,3. Hypertensive retinopathy OU - discussed importance of tight BP control - monitor  4. Pseudophakia OU  - s/p CE/IOL OU (March 2023; Dr. Harrie)  - IOL in good position, doing well  - monitor  Ophthalmic Meds Ordered this visit:  Meds ordered this encounter  Medications   aflibercept  (EYLEA ) SOLN 2 mg    Return in about 7 weeks (around 12/19/2024) for BRVO OD, DFE, OCT, poss IVE OD.  There are no Patient Instructions on file for this visit.   This document serves as a record of services personally performed by Redell JUDITHANN Hans, MD, PhD. It was created on their behalf by Wanda GEANNIE Keens, COT an ophthalmic technician. The creation of this record is the provider's dictation and/or activities during the visit.    Electronically signed by:  Wanda GEANNIE Keens, COT  11/09/24 4:36 PM  This document serves as a record of services personally  performed by Redell JUDITHANN Hans, MD, PhD. It was created on their behalf by Almetta Pesa, an ophthalmic technician. The creation of this record is the provider's dictation and/or activities during the visit.    Electronically signed by: Almetta Pesa, OA, 11/09/24  4:36 PM  Redell JUDITHANN Hans, M.D., Ph.D. Diseases & Surgery of the Retina and Vitreous [facility]  I have reviewed the above documentation for accuracy and completeness, and I agree with the above. Redell JUDITHANN Hans, M.D., Ph.D. 11/09/24 4:37 PM   Abbreviations: M myopia (nearsighted); A astigmatism; H hyperopia (farsighted); P presbyopia; Mrx spectacle prescription;  CTL contact lenses; OD right eye; OS left eye; OU both eyes  XT exotropia; ET esotropia; PEK punctate epithelial keratitis; PEE punctate epithelial erosions; DES dry eye syndrome; MGD meibomian gland dysfunction; ATs artificial tears; PFAT's preservative free artificial tears; NSC nuclear sclerotic cataract; PSC posterior subcapsular cataract; ERM epi-retinal membrane; PVD posterior vitreous detachment; RD  retinal detachment; DM diabetes mellitus; DR diabetic retinopathy; NPDR non-proliferative diabetic retinopathy; PDR proliferative diabetic retinopathy; CSME clinically significant macular edema; DME diabetic macular edema; dbh dot blot hemorrhages; CWS cotton wool spot; POAG primary open angle glaucoma; C/D cup-to-disc ratio; HVF humphrey visual field; GVF goldmann visual field; OCT optical coherence tomography; IOP intraocular pressure; BRVO Branch retinal vein occlusion; CRVO central retinal vein occlusion; CRAO central retinal artery occlusion; BRAO branch retinal artery occlusion; RT retinal tear; SB scleral buckle; PPV pars plana vitrectomy; VH Vitreous hemorrhage; PRP panretinal laser photocoagulation; IVK intravitreal kenalog; VMT vitreomacular traction; MH Macular hole;  NVD neovascularization of the disc; NVE neovascularization elsewhere; AREDS  age related eye disease study; ARMD age related macular degeneration; POAG primary open angle glaucoma; EBMD epithelial/anterior basement membrane dystrophy; ACIOL anterior chamber intraocular lens; IOL intraocular lens; PCIOL posterior chamber intraocular lens; Phaco/IOL phacoemulsification with intraocular lens placement; PRK photorefractive keratectomy; LASIK laser assisted in situ keratomileusis; HTN hypertension; DM diabetes mellitus; COPD chronic obstructive pulmonary disease  "

## 2024-10-24 ENCOUNTER — Other Ambulatory Visit: Payer: Self-pay | Admitting: Diagnostic Neuroimaging

## 2024-10-31 ENCOUNTER — Ambulatory Visit (INDEPENDENT_AMBULATORY_CARE_PROVIDER_SITE_OTHER): Admitting: Ophthalmology

## 2024-10-31 ENCOUNTER — Encounter (INDEPENDENT_AMBULATORY_CARE_PROVIDER_SITE_OTHER): Payer: Self-pay | Admitting: Ophthalmology

## 2024-10-31 DIAGNOSIS — H35033 Hypertensive retinopathy, bilateral: Secondary | ICD-10-CM

## 2024-10-31 DIAGNOSIS — H34831 Tributary (branch) retinal vein occlusion, right eye, with macular edema: Secondary | ICD-10-CM | POA: Diagnosis not present

## 2024-10-31 DIAGNOSIS — Z961 Presence of intraocular lens: Secondary | ICD-10-CM | POA: Diagnosis not present

## 2024-10-31 DIAGNOSIS — I10 Essential (primary) hypertension: Secondary | ICD-10-CM | POA: Diagnosis not present

## 2024-10-31 MED ORDER — AFLIBERCEPT 2MG/0.05ML IZ SOLN FOR KALEIDOSCOPE
2.0000 mg | INTRAVITREAL | Status: AC | PRN
  Administered 2024-10-31: 2 mg via INTRAVITREAL

## 2024-11-08 ENCOUNTER — Other Ambulatory Visit: Payer: Self-pay | Admitting: Internal Medicine

## 2024-11-08 DIAGNOSIS — K219 Gastro-esophageal reflux disease without esophagitis: Secondary | ICD-10-CM

## 2024-11-20 ENCOUNTER — Telehealth: Payer: Self-pay | Admitting: Diagnostic Neuroimaging

## 2024-11-20 ENCOUNTER — Ambulatory Visit: Admitting: Diagnostic Neuroimaging

## 2024-11-20 NOTE — Telephone Encounter (Signed)
Office closed due to weather 

## 2024-11-22 ENCOUNTER — Telehealth: Payer: Self-pay | Admitting: Diagnostic Neuroimaging

## 2024-11-22 ENCOUNTER — Other Ambulatory Visit: Payer: Self-pay | Admitting: Diagnostic Neuroimaging

## 2024-11-22 NOTE — Telephone Encounter (Signed)
 Patient's husband LVM at 10:15am to reschedule cancelled appointment due to the weather.

## 2024-11-22 NOTE — Telephone Encounter (Signed)
 Request to reschedule appointment from day office was closed.  Pt also on wait list, RMA for MD gave the okay to offer pt appointment slot that spouse accepted.

## 2024-11-23 NOTE — Telephone Encounter (Signed)
 Last refilled by patient on : 10/24/24 Last office visit : 11/22/23 Next office visit : 01/09/25 Per last office note:  increase carbidopa  / levodopa  to 2 tabs three times a day

## 2024-11-30 ENCOUNTER — Ambulatory Visit: Admitting: Physician Assistant

## 2024-12-19 ENCOUNTER — Encounter (INDEPENDENT_AMBULATORY_CARE_PROVIDER_SITE_OTHER): Admitting: Ophthalmology

## 2025-01-09 ENCOUNTER — Ambulatory Visit: Admitting: Diagnostic Neuroimaging
